# Patient Record
Sex: Female | Born: 1965 | Race: White | Hispanic: No | Smoking: Former smoker
Health system: Southern US, Community
[De-identification: ages and names within clinical notes are randomized; demographics above are authoritative.]

## PROBLEM LIST (undated history)

## (undated) DIAGNOSIS — F329 Major depressive disorder, single episode, unspecified: Secondary | ICD-10-CM

## (undated) DIAGNOSIS — F32A Depression, unspecified: Secondary | ICD-10-CM

## (undated) DIAGNOSIS — N200 Calculus of kidney: Secondary | ICD-10-CM

## (undated) DIAGNOSIS — J40 Bronchitis, not specified as acute or chronic: Secondary | ICD-10-CM

## (undated) DIAGNOSIS — K219 Gastro-esophageal reflux disease without esophagitis: Secondary | ICD-10-CM

## (undated) DIAGNOSIS — I10 Essential (primary) hypertension: Secondary | ICD-10-CM

## (undated) DIAGNOSIS — F419 Anxiety disorder, unspecified: Secondary | ICD-10-CM

## (undated) DIAGNOSIS — Z87442 Personal history of urinary calculi: Secondary | ICD-10-CM

## (undated) DIAGNOSIS — O24419 Gestational diabetes mellitus in pregnancy, unspecified control: Secondary | ICD-10-CM

## (undated) DIAGNOSIS — K8020 Calculus of gallbladder without cholecystitis without obstruction: Secondary | ICD-10-CM

## (undated) DIAGNOSIS — J45909 Unspecified asthma, uncomplicated: Secondary | ICD-10-CM

## (undated) HISTORY — PX: EXTRACORPOREAL SHOCK WAVE LITHOTRIPSY: SHX1557

## (undated) HISTORY — DX: Gestational diabetes mellitus in pregnancy, unspecified control: O24.419

## (undated) HISTORY — PX: HERNIA REPAIR: SHX51

---

## 2005-08-14 ENCOUNTER — Emergency Department (HOSPITAL_COMMUNITY): Admission: EM | Admit: 2005-08-14 | Discharge: 2005-08-14 | Payer: Self-pay | Admitting: Emergency Medicine

## 2005-08-23 ENCOUNTER — Emergency Department (HOSPITAL_COMMUNITY): Admission: EM | Admit: 2005-08-23 | Discharge: 2005-08-23 | Payer: Self-pay | Admitting: Emergency Medicine

## 2006-04-24 ENCOUNTER — Ambulatory Visit (HOSPITAL_COMMUNITY): Admission: RE | Admit: 2006-04-24 | Discharge: 2006-04-24 | Payer: Self-pay | Admitting: Urology

## 2006-05-09 ENCOUNTER — Emergency Department (HOSPITAL_COMMUNITY): Admission: EM | Admit: 2006-05-09 | Discharge: 2006-05-09 | Payer: Self-pay | Admitting: Emergency Medicine

## 2006-05-31 ENCOUNTER — Ambulatory Visit (HOSPITAL_COMMUNITY): Admission: RE | Admit: 2006-05-31 | Discharge: 2006-05-31 | Payer: Self-pay | Admitting: Urology

## 2006-07-16 ENCOUNTER — Emergency Department (HOSPITAL_COMMUNITY): Admission: EM | Admit: 2006-07-16 | Discharge: 2006-07-16 | Payer: Self-pay | Admitting: Emergency Medicine

## 2007-01-08 ENCOUNTER — Emergency Department (HOSPITAL_COMMUNITY): Admission: EM | Admit: 2007-01-08 | Discharge: 2007-01-09 | Payer: Self-pay | Admitting: Emergency Medicine

## 2008-08-27 ENCOUNTER — Emergency Department (HOSPITAL_COMMUNITY): Admission: EM | Admit: 2008-08-27 | Discharge: 2008-08-27 | Payer: Self-pay | Admitting: Emergency Medicine

## 2008-12-02 ENCOUNTER — Ambulatory Visit (HOSPITAL_COMMUNITY): Admission: RE | Admit: 2008-12-02 | Discharge: 2008-12-02 | Payer: Self-pay | Admitting: Family Medicine

## 2009-10-12 ENCOUNTER — Other Ambulatory Visit: Admission: RE | Admit: 2009-10-12 | Discharge: 2009-10-12 | Payer: Self-pay | Admitting: Obstetrics & Gynecology

## 2010-01-18 ENCOUNTER — Inpatient Hospital Stay (HOSPITAL_COMMUNITY): Admission: EM | Admit: 2010-01-18 | Discharge: 2010-01-20 | Payer: Self-pay | Admitting: Emergency Medicine

## 2010-01-28 ENCOUNTER — Emergency Department (HOSPITAL_COMMUNITY): Admission: EM | Admit: 2010-01-28 | Discharge: 2010-01-28 | Payer: Self-pay | Admitting: Emergency Medicine

## 2010-07-25 ENCOUNTER — Emergency Department (HOSPITAL_COMMUNITY): Admission: EM | Admit: 2010-07-25 | Discharge: 2010-07-25 | Payer: Self-pay | Admitting: Emergency Medicine

## 2010-10-14 ENCOUNTER — Ambulatory Visit (HOSPITAL_COMMUNITY): Admission: RE | Admit: 2010-10-14 | Payer: Self-pay | Admitting: Obstetrics & Gynecology

## 2010-12-04 ENCOUNTER — Encounter: Payer: Self-pay | Admitting: Obstetrics & Gynecology

## 2011-01-01 ENCOUNTER — Encounter (HOSPITAL_COMMUNITY): Payer: Self-pay | Admitting: Radiology

## 2011-01-01 ENCOUNTER — Emergency Department (HOSPITAL_COMMUNITY)
Admission: EM | Admit: 2011-01-01 | Discharge: 2011-01-01 | Disposition: A | Discharge status: Home / Self Care | Payer: Medicaid Other | Attending: Emergency Medicine | Admitting: Emergency Medicine

## 2011-01-01 ENCOUNTER — Emergency Department (HOSPITAL_COMMUNITY): Payer: Medicaid Other

## 2011-01-01 DIAGNOSIS — N23 Unspecified renal colic: Secondary | ICD-10-CM | POA: Insufficient documentation

## 2011-01-01 DIAGNOSIS — N200 Calculus of kidney: Secondary | ICD-10-CM | POA: Insufficient documentation

## 2011-01-01 DIAGNOSIS — R109 Unspecified abdominal pain: Secondary | ICD-10-CM | POA: Insufficient documentation

## 2011-01-01 HISTORY — DX: Calculus of kidney: N20.0

## 2011-01-01 LAB — URINALYSIS, ROUTINE W REFLEX MICROSCOPIC
Bilirubin Urine: NEGATIVE
Ketones, ur: NEGATIVE mg/dL
Leukocytes, UA: NEGATIVE
Nitrite: NEGATIVE
Protein, ur: NEGATIVE mg/dL
Specific Gravity, Urine: >1.03 — ABNORMAL HIGH (ref 1.005–1.030)
Urine Glucose, Fasting: NEGATIVE mg/dL
Urobilinogen, UA: 0.2 mg/dL (ref 0.0–1.0)
pH: 5.5 (ref 5.0–8.0)

## 2011-01-01 LAB — URINE MICROSCOPIC-ADD ON

## 2011-02-05 LAB — URINE MICROSCOPIC-ADD ON

## 2011-02-05 LAB — BASIC METABOLIC PANEL
BUN: 5 mg/dL — ABNORMAL LOW (ref 6–23)
BUN: 7 mg/dL (ref 6–23)
CO2: 28 mEq/L (ref 19–32)
CO2: 28 mEq/L (ref 19–32)
Calcium: 9.1 mg/dL (ref 8.4–10.5)
Calcium: 9.3 mg/dL (ref 8.4–10.5)
Calcium: 9.3 mg/dL (ref 8.4–10.5)
Chloride: 104 mEq/L (ref 96–112)
Chloride: 104 mEq/L (ref 96–112)
Creatinine, Ser: 0.63 mg/dL (ref 0.4–1.2)
Creatinine, Ser: 0.64 mg/dL (ref 0.4–1.2)
GFR calc Af Amer: >60 mL/min (ref >60)
GFR calc Af Amer: >60 mL/min (ref >60)
GFR calc Af Amer: >60 mL/min (ref >60)
GFR calc non Af Amer: >60 mL/min (ref >60)
GFR calc non Af Amer: >60 mL/min (ref >60)
GFR calc non Af Amer: >60 mL/min (ref >60)
Glucose, Bld: 143 mg/dL — ABNORMAL HIGH (ref 70–99)
Glucose, Bld: 91 mg/dL (ref 70–99)
Potassium: 3.8 mEq/L (ref 3.5–5.1)
Potassium: 4.2 mEq/L (ref 3.5–5.1)
Sodium: 135 mEq/L (ref 135–145)
Sodium: 135 mEq/L (ref 135–145)
Sodium: 136 mEq/L (ref 135–145)

## 2011-02-05 LAB — DIFFERENTIAL
Basophils Absolute: 0 10*3/uL (ref 0.0–0.1)
Basophils Absolute: 0 10*3/uL (ref 0.0–0.1)
Basophils Absolute: 0 10*3/uL (ref 0.0–0.1)
Basophils Relative: 0 % (ref 0–1)
Basophils Relative: 0 % (ref 0–1)
Eosinophils Absolute: 0.1 10*3/uL (ref 0.0–0.7)
Eosinophils Absolute: 0.1 10*3/uL (ref 0.0–0.7)
Eosinophils Relative: 1 % (ref 0–5)
Eosinophils Relative: 1 % (ref 0–5)
Lymphocytes Relative: 19 % (ref 12–46)
Lymphocytes Relative: 21 % (ref 12–46)
Lymphocytes Relative: 5 % — ABNORMAL LOW (ref 12–46)
Lymphs Abs: 1.8 10*3/uL (ref 0.7–4.0)
Lymphs Abs: 2.6 10*3/uL (ref 0.7–4.0)
Monocytes Absolute: 0.8 10*3/uL (ref 0.1–1.0)
Monocytes Absolute: 0.9 10*3/uL (ref 0.1–1.0)
Monocytes Absolute: 1.1 10*3/uL — ABNORMAL HIGH (ref 0.1–1.0)
Monocytes Relative: 5 % (ref 3–12)
Monocytes Relative: 7 % (ref 3–12)
Monocytes Relative: 9 % (ref 3–12)
Neutro Abs: 18.8 10*3/uL — ABNORMAL HIGH (ref 1.7–7.7)
Neutro Abs: 6.9 10*3/uL (ref 1.7–7.7)
Neutro Abs: 8.5 10*3/uL — ABNORMAL HIGH (ref 1.7–7.7)
Neutrophils Relative %: 71 % (ref 43–77)
Neutrophils Relative %: 71 % (ref 43–77)

## 2011-02-05 LAB — URINALYSIS, ROUTINE W REFLEX MICROSCOPIC
Bilirubin Urine: NEGATIVE
Glucose, UA: NEGATIVE mg/dL
Ketones, ur: NEGATIVE mg/dL
Leukocytes, UA: NEGATIVE
Nitrite: NEGATIVE
Protein, ur: NEGATIVE mg/dL
Specific Gravity, Urine: <1.005 — ABNORMAL LOW (ref 1.005–1.030)
Urobilinogen, UA: 0.2 mg/dL (ref 0.0–1.0)
pH: 5.5 (ref 5.0–8.0)

## 2011-02-05 LAB — CBC
HCT: 35.3 % — ABNORMAL LOW (ref 36.0–46.0)
HCT: 37.9 % (ref 36.0–46.0)
Hemoglobin: 12.6 g/dL (ref 12.0–15.0)
Hemoglobin: 13.5 g/dL (ref 12.0–15.0)
Hemoglobin: 13.7 g/dL (ref 12.0–15.0)
MCHC: 35.7 g/dL (ref 30.0–36.0)
MCHC: 35.7 g/dL (ref 30.0–36.0)
MCV: 93.3 fL (ref 78.0–100.0)
MCV: 93.8 fL (ref 78.0–100.0)
Platelets: 342 10*3/uL (ref 150–400)
Platelets: 385 10*3/uL (ref 150–400)
RBC: 3.76 MIL/uL — ABNORMAL LOW (ref 3.87–5.11)
RBC: 4.06 MIL/uL (ref 3.87–5.11)
RBC: 4.17 MIL/uL (ref 3.87–5.11)
RDW: 12.2 % (ref 11.5–15.5)
RDW: 12.5 % (ref 11.5–15.5)
WBC: 12.1 10*3/uL — ABNORMAL HIGH (ref 4.0–10.5)
WBC: 20.9 10*3/uL — ABNORMAL HIGH (ref 4.0–10.5)
WBC: 9.7 10*3/uL (ref 4.0–10.5)

## 2011-02-05 LAB — URINE CULTURE
Colony Count: NO GROWTH
Colony Count: NO GROWTH
Culture: NO GROWTH
Culture: NO GROWTH

## 2011-02-05 LAB — GLUCOSE, CAPILLARY: Glucose-Capillary: 129 mg/dL — ABNORMAL HIGH (ref 70–99)

## 2011-08-14 LAB — URINALYSIS, ROUTINE W REFLEX MICROSCOPIC
Glucose, UA: NEGATIVE
Leukocytes, UA: NEGATIVE
Specific Gravity, Urine: 1.025
Urobilinogen, UA: 0.2

## 2011-08-14 LAB — URINE MICROSCOPIC-ADD ON

## 2011-08-18 ENCOUNTER — Other Ambulatory Visit (HOSPITAL_COMMUNITY)
Admission: RE | Admit: 2011-08-18 | Discharge: 2011-08-18 | Disposition: A | Discharge status: Home / Self Care | Payer: Medicaid Other | Source: Ambulatory Visit | Attending: Obstetrics & Gynecology | Admitting: Obstetrics & Gynecology

## 2011-08-18 ENCOUNTER — Other Ambulatory Visit: Payer: Self-pay | Admitting: Obstetrics & Gynecology

## 2011-08-18 DIAGNOSIS — Z01419 Encounter for gynecological examination (general) (routine) without abnormal findings: Secondary | ICD-10-CM | POA: Insufficient documentation

## 2011-08-28 ENCOUNTER — Other Ambulatory Visit: Payer: Self-pay | Admitting: Obstetrics & Gynecology

## 2011-08-28 DIAGNOSIS — Z139 Encounter for screening, unspecified: Secondary | ICD-10-CM

## 2011-09-01 ENCOUNTER — Ambulatory Visit (HOSPITAL_COMMUNITY)
Admission: RE | Admit: 2011-09-01 | Discharge: 2011-09-01 | Disposition: A | Discharge status: Home / Self Care | Payer: Medicaid Other | Source: Ambulatory Visit | Attending: Obstetrics & Gynecology | Admitting: Obstetrics & Gynecology

## 2011-09-01 ENCOUNTER — Other Ambulatory Visit: Payer: Self-pay | Admitting: Obstetrics & Gynecology

## 2011-09-01 DIAGNOSIS — Z139 Encounter for screening, unspecified: Secondary | ICD-10-CM

## 2011-09-01 DIAGNOSIS — Z1231 Encounter for screening mammogram for malignant neoplasm of breast: Secondary | ICD-10-CM | POA: Insufficient documentation

## 2011-12-13 ENCOUNTER — Emergency Department (HOSPITAL_COMMUNITY): Payer: Self-pay

## 2011-12-13 ENCOUNTER — Encounter (HOSPITAL_COMMUNITY): Payer: Self-pay | Admitting: *Deleted

## 2011-12-13 ENCOUNTER — Emergency Department (HOSPITAL_COMMUNITY)
Admission: EM | Admit: 2011-12-13 | Discharge: 2011-12-13 | Disposition: A | Discharge status: Home / Self Care | Payer: Self-pay | Attending: Emergency Medicine | Admitting: Emergency Medicine

## 2011-12-13 DIAGNOSIS — J209 Acute bronchitis, unspecified: Secondary | ICD-10-CM | POA: Insufficient documentation

## 2011-12-13 DIAGNOSIS — F17200 Nicotine dependence, unspecified, uncomplicated: Secondary | ICD-10-CM | POA: Insufficient documentation

## 2011-12-13 HISTORY — DX: Bronchitis, not specified as acute or chronic: J40

## 2011-12-13 MED ORDER — PREDNISONE 20 MG PO TABS: ORAL_TABLET | ORAL | Status: DC

## 2011-12-13 MED ORDER — ALBUTEROL (5 MG/ML) CONTINUOUS INHALATION SOLN
10.0000 mg/h | INHALATION_SOLUTION | RESPIRATORY_TRACT | Status: AC
  Administered 2011-12-13: 10 mg/h via RESPIRATORY_TRACT
  Filled 2011-12-13: qty 20

## 2011-12-13 MED ORDER — DOXYCYCLINE HYCLATE 100 MG PO CAPS: 100.0000 mg | ORAL_CAPSULE | Freq: Two times a day (BID) | ORAL | Status: AC

## 2011-12-13 MED ORDER — AEROCHAMBER Z-STAT PLUS/MEDIUM MISC
1.0000 | Freq: Once | Status: DC
Start: 2011-12-13 — End: 2011-12-13

## 2011-12-13 MED ORDER — IPRATROPIUM BROMIDE 0.02 % IN SOLN
0.5000 mg | Freq: Once | RESPIRATORY_TRACT | Status: AC
  Administered 2011-12-13: 0.5 mg via RESPIRATORY_TRACT
  Filled 2011-12-13: qty 2.5

## 2011-12-13 MED ORDER — ALBUTEROL SULFATE HFA 108 (90 BASE) MCG/ACT IN AERS
2.0000 | INHALATION_SPRAY | RESPIRATORY_TRACT | Status: DC | PRN
  Administered 2011-12-13: 2 via RESPIRATORY_TRACT
  Filled 2011-12-13: qty 6.7

## 2011-12-13 MED ORDER — DM-GUAIFENESIN ER 30-600 MG PO TB12
1.0000 | ORAL_TABLET | Freq: Once | ORAL | Status: AC
  Administered 2011-12-13: 1 via ORAL
  Filled 2011-12-13: qty 1

## 2011-12-13 MED ORDER — PREDNISONE 20 MG PO TABS
60.0000 mg | ORAL_TABLET | ORAL | Status: AC
  Administered 2011-12-13: 60 mg via ORAL
  Filled 2011-12-13: qty 3

## 2011-12-13 NOTE — ED Notes (Signed)
Pt stated she has been sick for 1 week w. Cough.  Has been using otc meds w. No relief.  resp even and unlabored.  Exp wheezes noted. Awaiting cxray

## 2011-12-13 NOTE — ED Notes (Signed)
Cough, sob for 1 week, sore in throat and chest, wheeze

## 2011-12-13 NOTE — ED Notes (Signed)
Warm blanket given, cont. W. Breathing treatment.  Watching tv.

## 2011-12-13 NOTE — ED Provider Notes (Signed)
History   This chart was scribed for Ward Givens, MD by Clarita Crane. The patient was seen in room APA12/APA12 and the patient's care was started at 12:18PM.   CSN: [id number]  Arrival date & time 12/13/11  1119   First MD Initiated Contact with Patient 12/13/11 1151      Chief Complaint  Patient presents with  . Shortness of Breath    (Consider location/radiation/quality/duration/timing/severity/associated sxs/prior treatment) HPI Michaela Aguilar is a 46 y.o. female who presents to the Emergency Department complaining of constant moderate SOB with associated productive cough with yellow sputum, wheezing, sore throat, abdominal pain and indicates the lateral aspect of her abdomen with coughing described as soreness and chest soreness with coughing onset 1 week ago and persistent since. Denies fever, nausea, vomiting, diarrhea. She relates the last time she had used an inhaler was one year ago   PCP- Dondiego    Past Medical History  Diagnosis Date  . Kidney stones per pt.  . Bronchitis     Past Surgical History  Procedure Date  . Cesarean section     History reviewed. No pertinent family history.  History  Substance Use Topics  . Smoking status: Current Everyday Smoker  . Smokeless tobacco: Not on file  . Alcohol Use: Yes  student in nursing school  OB History    Grav Para Term Preterm Abortions TAB SAB Ect Mult Living                  Review of Systems 10 Systems reviewed and are negative for acute change except as noted in the HPI.  Allergies  Review of patient's allergies indicates no known allergies.  Home Medications  No current outpatient prescriptions on file.  BP 115/79  Pulse 72  Temp 98.7 F (37.1 C)  Resp 20  Ht 5\' 5"  (1.651 m)  Wt 224 lb (101.606 kg)  BMI 37.28 kg/m2  SpO2 97%  LMP 12/07/2011   Vital signs normal    Physical Exam  Nursing note and vitals reviewed. Constitutional: She is oriented to person, place, and time. She  appears well-developed and well-nourished. No distress.  HENT:  Head: Normocephalic and atraumatic.  Mouth/Throat: Oropharynx is clear and moist. No oropharyngeal exudate.  Eyes: EOM are normal. Pupils are equal, round, and reactive to light.  Neck: Neck supple. No tracheal deviation present.  Cardiovascular: Normal rate and regular rhythm.   No murmur heard. Pulmonary/Chest: She is in respiratory distress. She has wheezes. She exhibits tenderness.       Actively coughing during exam. Diminished breath sounds diffusely with end expiratory wheezing diffusely  Abdominal: Soft. She exhibits no distension.  Musculoskeletal: Normal range of motion. She exhibits no edema.  Neurological: She is alert and oriented to person, place, and time. No sensory deficit.  Skin: Skin is warm and dry.  Psychiatric: She has a normal mood and affect. Her behavior is normal.    ED Course  Procedures (including critical care time)  DIAGNOSTIC STUDIES: Oxygen Saturation is 97% on room air, normal by my interpretation.    COORDINATION OF CARE: 12.20PM- Patient informed of current plan for treatment and evaluation and agrees with plan at this time.  2:18PM- Patient current receiving breathing treatment. Upon re-evaluation, patient with improved air movement and diffuse lower pitched wheezing.   16:15 recheck, lungs are much improved, much better air flow, rare wheeze. States her cough is better and she feels ready to go home.    Medications  albuterol (PROVENTIL,VENTOLIN) solution continuous neb (10 mg/hr Nebulization New Bag/Given 12/13/11 1240)  albuterol (PROVENTIL HFA;VENTOLIN HFA) 108 (90 BASE) MCG/ACT inhaler 2 puff (not administered)  aerochamber Z-Stat Plus/medium 1 each (not administered)  ipratropium (ATROVENT) nebulizer solution 0.5 mg (0.5 mg Nebulization Given 12/13/11 1240)  predniSONE (DELTASONE) tablet 60 mg (60 mg Oral Given 12/13/11 1225)  dextromethorphan-guaiFENesin (MUCINEX DM) 30-600 MG  per 12 hr tablet 1 tablet (1 tablet Oral Given 12/13/11 1317)    Dg Chest 2 View  12/13/2011  *RADIOLOGY REPORT*  Clinical Data: Cough.  Short of breath.  CHEST - 2 VIEW  Comparison: None.  Findings:  Cardiopericardial silhouette within normal limits. Mediastinal contours normal. Trachea midline.  No airspace disease or effusion.  Bosselation of the left hemidiaphragm.  IMPRESSION: No acute cardiopulmonary disease.  No interval change.  Original Report Authenticated By: Andreas Newport, M.D.    Diagnoses that have been ruled out:  None  Diagnoses that are still under consideration:  None  Final diagnoses:  Bronchitis with bronchospasm   New Prescriptions   DOXYCYCLINE (VIBRAMYCIN) 100 MG CAPSULE    Take 1 capsule (100 mg total) by mouth 2 (two) times daily.   PREDNISONE (DELTASONE) 20 MG TABLET    Take 3 po QD x 2d starting tomorrow, then 2 po QD x 3d then 1 po QD x 3d   Plan discharge  Devoria Albe, MD, FACEP     MDM    I personally performed the services described in this documentation, which was scribed in my presence. The recorded information has been reviewed and considered. Devoria Albe, MD, Armando Gang        Ward Givens, MD 12/13/11 [phone]

## 2011-12-13 NOTE — ED Notes (Signed)
Pt w/ cont. Neb.  Stated she is feeling much better.

## 2011-12-13 NOTE — ED Notes (Signed)
Cont, to await meds

## 2011-12-13 NOTE — ED Notes (Signed)
Awaiting meds from rx.  Pt breathing better after treatment

## 2012-10-30 ENCOUNTER — Emergency Department (HOSPITAL_COMMUNITY)
Admission: EM | Admit: 2012-10-30 | Discharge: 2012-10-30 | Disposition: A | Discharge status: Home / Self Care | Payer: Self-pay | Attending: Emergency Medicine | Admitting: Emergency Medicine

## 2012-10-30 ENCOUNTER — Emergency Department (HOSPITAL_COMMUNITY): Payer: Self-pay

## 2012-10-30 ENCOUNTER — Encounter (HOSPITAL_COMMUNITY): Payer: Self-pay | Admitting: Emergency Medicine

## 2012-10-30 DIAGNOSIS — M436 Torticollis: Secondary | ICD-10-CM | POA: Insufficient documentation

## 2012-10-30 DIAGNOSIS — IMO0001 Reserved for inherently not codable concepts without codable children: Secondary | ICD-10-CM | POA: Insufficient documentation

## 2012-10-30 DIAGNOSIS — Z87442 Personal history of urinary calculi: Secondary | ICD-10-CM | POA: Insufficient documentation

## 2012-10-30 DIAGNOSIS — F17200 Nicotine dependence, unspecified, uncomplicated: Secondary | ICD-10-CM | POA: Insufficient documentation

## 2012-10-30 DIAGNOSIS — Z8709 Personal history of other diseases of the respiratory system: Secondary | ICD-10-CM | POA: Insufficient documentation

## 2012-10-30 MED ORDER — KETOROLAC TROMETHAMINE 60 MG/2ML IM SOLN
60.0000 mg | Freq: Once | INTRAMUSCULAR | Status: AC
  Administered 2012-10-30: 60 mg via INTRAMUSCULAR

## 2012-10-30 MED ORDER — IBUPROFEN 600 MG PO TABS: 600.0000 mg | ORAL_TABLET | Freq: Four times a day (QID) | ORAL | Status: DC | PRN

## 2012-10-30 MED ORDER — METHOCARBAMOL 500 MG PO TABS: 1000.0000 mg | ORAL_TABLET | Freq: Four times a day (QID) | ORAL | Status: AC

## 2012-10-30 MED ORDER — KETOROLAC TROMETHAMINE 60 MG/2ML IM SOLN
INTRAMUSCULAR | Status: AC
  Administered 2012-10-30: 60 mg via INTRAMUSCULAR
  Filled 2012-10-30: qty 2

## 2012-10-30 MED ORDER — HYDROMORPHONE HCL PF 1 MG/ML IJ SOLN
1.0000 mg | Freq: Once | INTRAMUSCULAR | Status: AC
  Administered 2012-10-30: 1 mg via INTRAMUSCULAR

## 2012-10-30 MED ORDER — HYDROMORPHONE HCL PF 1 MG/ML IJ SOLN
INTRAMUSCULAR | Status: AC
  Filled 2012-10-30: qty 1

## 2012-10-30 MED ORDER — HYDROCODONE-ACETAMINOPHEN 5-325 MG PO TABS
1.0000 | ORAL_TABLET | Freq: Once | ORAL | Status: AC
  Administered 2012-10-30: 1 via ORAL
  Filled 2012-10-30: qty 1

## 2012-10-30 MED ORDER — METHOCARBAMOL 500 MG PO TABS
1000.0000 mg | ORAL_TABLET | Freq: Once | ORAL | Status: AC
  Administered 2012-10-30: 1000 mg via ORAL
  Filled 2012-10-30 (×2): qty 1

## 2012-10-30 MED ORDER — OXYCODONE-ACETAMINOPHEN 5-325 MG PO TABS: 1.0000 | ORAL_TABLET | ORAL | Status: AC | PRN

## 2012-10-30 NOTE — ED Notes (Signed)
Patient with no complaints at this time. Respirations even and unlabored. Skin warm/dry. Discharge instructions reviewed with patient at this time. Patient given opportunity to voice concerns/ask questions. Patient discharged at this time and left Emergency Department with steady gait.   

## 2012-10-30 NOTE — ED Notes (Signed)
Pt c/o neck pain x 2 days. Pt denies any injury.

## 2012-10-31 NOTE — ED Provider Notes (Signed)
Medical screening examination/treatment/procedure(s) were performed by non-physician practitioner and as supervising physician I was immediately available for consultation/collaboration.   Domitila Stetler III, MD 10/31/12 1330 

## 2012-10-31 NOTE — ED Provider Notes (Signed)
History     CSN: [id number]  Arrival date & time 10/30/12  1815   First MD Initiated Contact with Patient 10/30/12 1908      Chief Complaint  Patient presents with  . Neck Pain    (Consider location/radiation/quality/duration/timing/severity/associated sxs/prior treatment) HPI Comments: Michaela Aguilar presents with a 2 day history of left sided neck and shoulder pain which is worse with movement.  She denies injury, stating she woke with pain and spasm which has become worse.  She denies weakness or numbness in her arms or hands. The pain is constant,  Worse with movement and palpation.  The pain does not radiate.  She has taken tylenol without relief.   The history is provided by the patient.    Past Medical History  Diagnosis Date  . Kidney stones per pt.  . Bronchitis     Past Surgical History  Procedure Date  . Cesarean section     History reviewed. No pertinent family history.  History  Substance Use Topics  . Smoking status: Current Every Day Smoker  . Smokeless tobacco: Not on file  . Alcohol Use: Yes    OB History    Grav Para Term Preterm Abortions TAB SAB Ect Mult Living                  Review of Systems  Constitutional: Negative for fever and chills.  HENT: Positive for neck pain and neck stiffness.   Eyes: Negative for photophobia and visual disturbance.  Respiratory: Negative for shortness of breath.   Cardiovascular: Negative for chest pain and leg swelling.  Gastrointestinal: Negative for abdominal pain, constipation and abdominal distention.  Genitourinary: Negative for dysuria, urgency, frequency, flank pain and difficulty urinating.  Musculoskeletal: Positive for myalgias. Negative for back pain, joint swelling and gait problem.  Skin: Negative for rash.  Neurological: Negative for weakness, numbness and headaches.    Allergies  Review of patient's allergies indicates no known allergies.  Home Medications   Current Outpatient Rx  Name   Route  Sig  Dispense  Refill  . ACETAMINOPHEN 500 MG PO TABS   Oral   Take 500-1,000 mg by mouth daily as needed. For pain         . ALPRAZOLAM 1 MG PO TABS   Oral   Take 1 mg by mouth daily as needed. For pain         . IBUPROFEN 600 MG PO TABS   Oral   Take 1 tablet (600 mg total) by mouth every 6 (six) hours as needed for pain.   30 tablet   0   . METHOCARBAMOL 500 MG PO TABS   Oral   Take 2 tablets (1,000 mg total) by mouth 4 (four) times daily.   40 tablet   0   . OXYCODONE-ACETAMINOPHEN 5-325 MG PO TABS   Oral   Take 1 tablet by mouth every 4 (four) hours as needed for pain.   15 tablet   0     BP 128/84  Pulse 85  Temp 98.8 F (37.1 C) (Oral)  Resp 20  Ht 5\' 5"  (1.651 m)  Wt 225 lb (102.059 kg)  BMI 37.44 kg/m2  SpO2 97%  LMP 10/23/2012  Physical Exam  Nursing note and vitals reviewed. Constitutional: She appears well-developed and well-nourished.  HENT:  Head: Normocephalic.  Eyes: Conjunctivae normal are normal.  Neck: Muscular tenderness present. No spinous process tenderness present. Decreased range of motion present.  TTP along left paracervical, left upper shoulder and left upper scapula in distribution of trapezius muscle,  Spasm noted along paracervical line. Decreased ROM worse with left rotation.    Cardiovascular: Normal rate and intact distal pulses.        Pedal pulses normal.  Pulmonary/Chest: Effort normal.  Abdominal: Soft. Bowel sounds are normal. She exhibits no distension and no mass.  Musculoskeletal: She exhibits no edema.       Lumbar back: She exhibits tenderness. She exhibits no swelling, no edema and no spasm.  Neurological: She is alert. She has normal strength. She displays no atrophy and no tremor. No sensory deficit. Gait normal.  Reflex Scores:      Bicep reflexes are 2+ on the right side and 2+ on the left side.      Equal grip strength.  Skin: Skin is warm and dry.  Psychiatric: She has a normal mood and  affect.    ED Course  Procedures (including critical care time)  Labs Reviewed - No data to display Dg Cervical Spine Complete  10/30/2012  *RADIOLOGY REPORT*  Clinical Data: 2-day history of neck pain.  No known injuries.  CERVICAL SPINE - COMPLETE 4+ VIEW  Comparison: None.  Findings: Straightening of the usual cervical lordosis.  Anatomic alignment.  No visible fractures.  Well-preserved disc spaces. Normal prevertebral soft tissues.  Facet joints intact.  No significant bony foraminal stenoses.  No static evidence of instability.  IMPRESSION: Straightening of the usual lordosis which may reflect positioning and/or spasm.  Otherwise normal examination.   Original Report Authenticated By: Hulan Saas, M.D.      1. Torticollis, acute       MDM  xrays reviewed.  Pt with musculoskeletal source of neck and shoulder pain, muscle spasm on exam, reproducible with rom.  Prescribed oxycodone, robaxin, ibuprofen.  Heat,  Stretching rom exercises discussed.  Prn f/u with pcp if not improved over the next week.        Burgess Amor, [place] 10/31/12 [phone]

## 2014-02-26 ENCOUNTER — Emergency Department (HOSPITAL_COMMUNITY)
Admission: EM | Admit: 2014-02-26 | Discharge: 2014-02-26 | Disposition: A | Discharge status: Home / Self Care | Payer: Self-pay | Attending: Emergency Medicine | Admitting: Emergency Medicine

## 2014-02-26 ENCOUNTER — Emergency Department (HOSPITAL_COMMUNITY): Payer: Self-pay

## 2014-02-26 ENCOUNTER — Encounter (HOSPITAL_COMMUNITY): Payer: Self-pay | Admitting: Emergency Medicine

## 2014-02-26 DIAGNOSIS — M159 Polyosteoarthritis, unspecified: Secondary | ICD-10-CM

## 2014-02-26 DIAGNOSIS — Z79899 Other long term (current) drug therapy: Secondary | ICD-10-CM | POA: Insufficient documentation

## 2014-02-26 DIAGNOSIS — F17200 Nicotine dependence, unspecified, uncomplicated: Secondary | ICD-10-CM | POA: Insufficient documentation

## 2014-02-26 DIAGNOSIS — R209 Unspecified disturbances of skin sensation: Secondary | ICD-10-CM | POA: Insufficient documentation

## 2014-02-26 DIAGNOSIS — M1990 Unspecified osteoarthritis, unspecified site: Secondary | ICD-10-CM | POA: Insufficient documentation

## 2014-02-26 DIAGNOSIS — N200 Calculus of kidney: Secondary | ICD-10-CM | POA: Insufficient documentation

## 2014-02-26 DIAGNOSIS — Z8709 Personal history of other diseases of the respiratory system: Secondary | ICD-10-CM | POA: Insufficient documentation

## 2014-02-26 MED ORDER — METHOCARBAMOL 500 MG PO TABS
1000.0000 mg | ORAL_TABLET | Freq: Once | ORAL | Status: AC
  Administered 2014-02-26: 1000 mg via ORAL
  Filled 2014-02-26: qty 2

## 2014-02-26 MED ORDER — MELOXICAM 7.5 MG PO TABS: ORAL_TABLET | ORAL | Status: DC

## 2014-02-26 MED ORDER — BACLOFEN 10 MG PO TABS: 10.0000 mg | ORAL_TABLET | Freq: Three times a day (TID) | ORAL | Status: AC

## 2014-02-26 MED ORDER — KETOROLAC TROMETHAMINE 10 MG PO TABS
10.0000 mg | ORAL_TABLET | Freq: Once | ORAL | Status: AC
  Administered 2014-02-26: 10 mg via ORAL
  Filled 2014-02-26: qty 1

## 2014-02-26 MED ORDER — PREDNISONE 50 MG PO TABS
60.0000 mg | ORAL_TABLET | Freq: Once | ORAL | Status: AC
  Administered 2014-02-26: 60 mg via ORAL
  Filled 2014-02-26 (×2): qty 1

## 2014-02-26 MED ORDER — DEXAMETHASONE 4 MG PO TABS: ORAL_TABLET | ORAL | Status: DC

## 2014-02-26 NOTE — ED Notes (Signed)
Pt with left hip pain that radiates down, also with left sided neck pain that radiates into her left shoulder and c/o numbness/tingling to left hand, pt denies any injury

## 2014-02-26 NOTE — ED Notes (Signed)
Pt c/o left hip pain that radiates into leg/foot with tingling in left foot x 2 days. Pt also c/o chronic neck pain x 2 years and radiating into left arm with tingling in left hand.

## 2014-02-26 NOTE — ED Provider Notes (Signed)
CSN: [id number]     Arrival date & time 02/26/14  1234 History   First MD Initiated Contact with Patient 02/26/14 1300     Chief Complaint  Patient presents with  . Hip Pain     (Consider location/radiation/quality/duration/timing/severity/associated sxs/prior Treatment) HPI Comments: Patient is a 48 year old female who presents to the emergency department with a complaint of pain from the neck to the left hip. Pain from the neck to the left arm, with tingling of the hand. Is also complained of pain from the neck down to the left foot with numbness and tingling of the left foot. The patient states she's been having problems with her neck for a little over a year. She was seen in the emergency department in 2013 which time she had an x-ray of the neck done and there were no acute changes noted. The patient was referred to her primary physician, Dr. Lorriane Shire. No acute problems were identified at that time. The patient states however she has been having some pain with her neck since that time. She states that in the last 2 or 3 days she has noticed that she has pain and numbness that goes into her left arm with tingling of her left hand, she also has pain from her neck down to her foot, with numbness of the left lower extremity. She is not dropping objects. She's not had falls. She's not had loss of bowel or bladder function. She's not had any problem with her speech, or eating. She presents now for assistance with her pain, and to attempt to find out why she is having such an increase in her discomfort.  The history is provided by the patient.    Past Medical History  Diagnosis Date  . Kidney stones per pt.  . Bronchitis    Past Surgical History  Procedure Laterality Date  . Cesarean section    . Hernia repair     No family history on file. History  Substance Use Topics  . Smoking status: Current Every Day Smoker  . Smokeless tobacco: Not on file  . Alcohol Use: No   OB History   Grav  Para Term Preterm Abortions TAB SAB Ect Mult Living                 Review of Systems  Constitutional: Negative for activity change.       All ROS Neg except as noted in HPI  HENT: Negative for nosebleeds.   Eyes: Negative for photophobia and discharge.  Respiratory: Negative for cough, shortness of breath and wheezing.   Cardiovascular: Negative for chest pain and palpitations.  Gastrointestinal: Negative for abdominal pain and blood in stool.  Genitourinary: Negative for dysuria, frequency and hematuria.  Musculoskeletal: Positive for back pain and neck pain. Negative for arthralgias.  Skin: Negative.   Neurological: Positive for numbness. Negative for dizziness, seizures and speech difficulty.  Psychiatric/Behavioral: Negative for hallucinations and confusion.      Allergies  Review of patient's allergies indicates no known allergies.  Home Medications   Prior to Admission medications   Medication Sig Start Date End Date Taking? Authorizing Provider  ALPRAZolam Duanne Moron) 1 MG tablet Take 1 mg by mouth 2 (two) times daily. For pain    Historical Provider, MD   BP 97/60  Pulse 77  Temp(Src) 98.2 F (36.8 C) (Oral)  Resp 18  Ht 5\' 5"  (1.651 m)  Wt 226 lb (102.513 kg)  BMI 37.61 kg/m2  SpO2 95%  LMP  02/16/2014 Physical Exam  Nursing note and vitals reviewed. Constitutional: She is oriented to person, place, and time. She appears well-developed and well-nourished.  Non-toxic appearance.  HENT:  Head: Normocephalic.  Right Ear: Tympanic membrane and external ear normal.  Left Ear: Tympanic membrane and external ear normal.  Eyes: EOM and lids are normal. Pupils are equal, round, and reactive to light.  Neck: Normal range of motion. Neck supple. Carotid bruit is not present.  Cardiovascular: Normal rate, regular rhythm, normal heart sounds, intact distal pulses and normal pulses.   Pulmonary/Chest: Breath sounds normal. No respiratory distress.  Abdominal: Soft. Bowel  sounds are normal. There is no tenderness. There is no guarding.  Musculoskeletal: Normal range of motion.  There is stiffness and pain with attempted range of motion of the cervical spine. There is paraspinal area tenderness, left greater than right. There is no palpable step off of the cervical spine area. There is pain with attempted range of motion of the left shoulder. There is pain to palpation just under the left scapula. The patient has pain with change of position in the lumbar area. There is right and left paraspinal area tenderness of the lumbar region. There is decreased range of motion of the left lower extremities do to pain. There is no hot joints appreciated. The radial pulses are 2+ bilaterally in the dorsalis pedis pulse 2+ bilaterally.  Lymphadenopathy:       Head (right side): No submandibular adenopathy present.       Head (left side): No submandibular adenopathy present.    She has no cervical adenopathy.  Neurological: She is alert and oriented to person, place, and time. She has normal strength. No cranial nerve deficit or sensory deficit.  The grip is symmetrical. The bony strength of the lower extremities is symmetrical. The patient states she has a numb tingling sensation to palpation of the left lower extremity. She states that her left hand feels numb during the course of the examination. There is no muscle atrophy of the upper or lower extremities.  Skin: Skin is warm and dry.  Psychiatric: She has a normal mood and affect. Her speech is normal.    ED Course  Procedures (including critical care time) Labs Review Labs Reviewed - No data to display  Imaging Review No results found.   EKG Interpretation None      MDM CT scan of the cervical spine is negative for any acute problem. CT scan of the abdomen and pelvis reveals a tiny calculi in the upper and lower pole of the left kidney, multiple calculi in the lower pole of the right kidney, with the largest being  7.0 mm in diameter. there no known ureteral calculi present. There is little if any degenerative changes in the lumbar spine. There is mild L4-L5 and L5-S1 facets arthropathy. There is no spinal stenosis or disc herniation appreciated.  The findings were discussed with the patient in terms which he understands. I suggested to the patient that she speak with her primary physician or orthopedic specialist concerning the hip pain and tingling in the foot. Prescription for Mobic, Decadron, and baclofen were given to the patient.    Final diagnoses:  None    *I have reviewed nursing notes, vital signs, and all appropriate lab and imaging results for this patient.Lenox Ahr, PA-C 02/28/14 [phone]

## 2014-02-26 NOTE — Discharge Instructions (Signed)
The CT scan of your cervical spine reveals some mild arthritis changes. The CT scan of your pelvis reveals some mild degenerative changes of your lower back and pelvis area. The CT scan of your abdomen revealed some stones in the kidney, but no stones in the ureters. Please use Mobic, baclofen, and Decadron to assist with your discomfort. You may benefit from assistance with the adult medicine clinic for help with your discomfort and for additional medical followup. Please call them for an appointment. Arthritis, Nonspecific Arthritis is pain, redness, warmth, or puffiness (inflammation) of a joint. The joint may be stiff or hurt when you move it. One or more joints may be affected. There are many types of arthritis. Your doctor may not know what type you have right away. The most common cause of arthritis is wear and tear on the joint (osteoarthritis). HOME CARE   Only take medicine as told by your doctor.  Rest the joint as much as possible.  Raise (elevate) your joint if it is puffy.  Use crutches if the painful joint is in your leg.  Drink enough fluids to keep your pee (urine) clear or pale yellow.  Follow your doctor's diet instructions.  Use cold packs for very bad joint pain for 10 to 15 minutes every hour. Ask your doctor if it is okay for you to use hot packs.  Exercise as told by your doctor.  Take a warm shower if you have stiffness in the morning.  Move your sore joints throughout the day. GET HELP RIGHT AWAY IF:   You have a fever.  You have very bad joint pain, puffiness, or redness.  You have many joints that are painful and puffy.  You are not getting better with treatment.  You have very bad back pain or leg weakness.  You cannot control when you poop (bowel movement) or pee (urinate).  You do not feel better in 24 hours or are getting worse.  You are having side effects from your medicine. MAKE SURE YOU:   Understand these instructions.  Will watch your  condition.  Will get help right away if you are not doing well or get worse. Document Released: 01/24/2010 Document Revised: 04/30/2012 Document Reviewed: 01/24/2010 [facility] Patient Information 2014 [place], [place].

## 2014-02-28 NOTE — ED Provider Notes (Signed)
  Medical screening examination/treatment/procedure(s) were performed by non-physician practitioner and as supervising physician I was immediately available for consultation/collaboration.   EKG Interpretation None         Carmin Muskrat, MD 02/28/14 2046

## 2014-08-30 ENCOUNTER — Emergency Department (HOSPITAL_COMMUNITY)
Admission: EM | Admit: 2014-08-30 | Discharge: 2014-08-30 | Disposition: A | Discharge status: Home / Self Care | Payer: Self-pay | Attending: Emergency Medicine | Admitting: Emergency Medicine

## 2014-08-30 ENCOUNTER — Emergency Department (HOSPITAL_COMMUNITY): Payer: Self-pay

## 2014-08-30 ENCOUNTER — Encounter (HOSPITAL_COMMUNITY): Payer: Self-pay | Admitting: Emergency Medicine

## 2014-08-30 DIAGNOSIS — Z79899 Other long term (current) drug therapy: Secondary | ICD-10-CM | POA: Insufficient documentation

## 2014-08-30 DIAGNOSIS — Z720 Tobacco use: Secondary | ICD-10-CM | POA: Insufficient documentation

## 2014-08-30 DIAGNOSIS — N200 Calculus of kidney: Secondary | ICD-10-CM | POA: Insufficient documentation

## 2014-08-30 LAB — COMPREHENSIVE METABOLIC PANEL
ALT: 24 U/L (ref 0–35)
AST: 19 U/L (ref 0–37)
Albumin: 3.8 g/dL (ref 3.5–5.2)
Alkaline Phosphatase: 86 U/L (ref 39–117)
Anion gap: 11 (ref 5–15)
BUN: 12 mg/dL (ref 6–23)
CALCIUM: 10 mg/dL (ref 8.4–10.5)
CO2: 25 meq/L (ref 19–32)
Chloride: 102 mEq/L (ref 96–112)
Creatinine, Ser: 0.8 mg/dL (ref 0.50–1.10)
GFR calc Af Amer: >90 mL/min (ref >90)
GFR, EST NON AFRICAN AMERICAN: 85 mL/min — AB (ref >90)
Glucose, Bld: 141 mg/dL — ABNORMAL HIGH (ref 70–99)
Potassium: 4 mEq/L (ref 3.7–5.3)
Sodium: 138 mEq/L (ref 137–147)
TOTAL PROTEIN: 7.3 g/dL (ref 6.0–8.3)
Total Bilirubin: 0.7 mg/dL (ref 0.3–1.2)

## 2014-08-30 LAB — CBC WITH DIFFERENTIAL/PLATELET
BASOS ABS: 0 10*3/uL (ref 0.0–0.1)
Basophils Relative: 0 % (ref 0–1)
EOS ABS: 0.2 10*3/uL (ref 0.0–0.7)
EOS PCT: 2 % (ref 0–5)
HEMATOCRIT: 41.6 % (ref 36.0–46.0)
Hemoglobin: 14.4 g/dL (ref 12.0–15.0)
Lymphocytes Relative: 25 % (ref 12–46)
Lymphs Abs: 2.5 10*3/uL (ref 0.7–4.0)
MCH: 32.1 pg (ref 26.0–34.0)
MCHC: 34.6 g/dL (ref 30.0–36.0)
MCV: 92.7 fL (ref 78.0–100.0)
MONO ABS: 0.9 10*3/uL (ref 0.1–1.0)
Monocytes Relative: 9 % (ref 3–12)
Neutro Abs: 6.3 10*3/uL (ref 1.7–7.7)
Neutrophils Relative %: 64 % (ref 43–77)
Platelets: 338 10*3/uL (ref 150–400)
RBC: 4.49 MIL/uL (ref 3.87–5.11)
RDW: 12.1 % (ref 11.5–15.5)
WBC: 10 10*3/uL (ref 4.0–10.5)

## 2014-08-30 LAB — URINE MICROSCOPIC-ADD ON

## 2014-08-30 LAB — URINALYSIS, ROUTINE W REFLEX MICROSCOPIC
Bilirubin Urine: NEGATIVE
GLUCOSE, UA: NEGATIVE mg/dL
KETONES UR: NEGATIVE mg/dL
Leukocytes, UA: NEGATIVE
Nitrite: NEGATIVE
Protein, ur: NEGATIVE mg/dL
Specific Gravity, Urine: 1.025 (ref 1.005–1.030)
UROBILINOGEN UA: 0.2 mg/dL (ref 0.0–1.0)
pH: 6 (ref 5.0–8.0)

## 2014-08-30 LAB — LIPASE, BLOOD: LIPASE: 23 U/L (ref 11–59)

## 2014-08-30 MED ORDER — ONDANSETRON HCL 4 MG/2ML IJ SOLN
4.0000 mg | Freq: Once | INTRAMUSCULAR | Status: AC
  Administered 2014-08-30: 4 mg via INTRAVENOUS
  Filled 2014-08-30: qty 2

## 2014-08-30 MED ORDER — OXYCODONE-ACETAMINOPHEN 5-325 MG PO TABS: 1.0000 | ORAL_TABLET | Freq: Four times a day (QID) | ORAL | Status: DC | PRN

## 2014-08-30 MED ORDER — HYDROMORPHONE HCL 1 MG/ML IJ SOLN
1.0000 mg | Freq: Once | INTRAMUSCULAR | Status: AC
  Administered 2014-08-30: 1 mg via INTRAVENOUS
  Filled 2014-08-30: qty 1

## 2014-08-30 MED ORDER — KETOROLAC TROMETHAMINE 30 MG/ML IJ SOLN
30.0000 mg | Freq: Once | INTRAMUSCULAR | Status: AC
  Administered 2014-08-30: 30 mg via INTRAVENOUS
  Filled 2014-08-30: qty 1

## 2014-08-30 MED ORDER — TAMSULOSIN HCL 0.4 MG PO CAPS: 0.4000 mg | ORAL_CAPSULE | Freq: Every day | ORAL | Status: DC

## 2014-08-30 MED ORDER — LORAZEPAM 2 MG/ML IJ SOLN
1.0000 mg | Freq: Once | INTRAMUSCULAR | Status: AC
  Administered 2014-08-30: 1 mg via INTRAVENOUS
  Filled 2014-08-30: qty 1

## 2014-08-30 MED ORDER — PROMETHAZINE HCL 25 MG PO TABS: 25.0000 mg | ORAL_TABLET | Freq: Four times a day (QID) | ORAL | Status: DC | PRN

## 2014-08-30 NOTE — ED Provider Notes (Signed)
CSN: [id number]     Arrival date & time 08/30/14  0441 History   First MD Initiated Contact with Patient 08/30/14 0454     Chief Complaint  Patient presents with  . Flank Pain     (Consider location/radiation/quality/duration/timing/severity/associated sxs/prior Treatment) Patient is a 48 y.o. female presenting with flank pain. The history is provided by the patient (the pt complains of right flank pain).  Flank Pain This is a new problem. The current episode started 6 to 12 hours ago. The problem occurs constantly. The problem has not changed since onset.Associated symptoms include abdominal pain. Pertinent negatives include no chest pain and no headaches. Nothing aggravates the symptoms. Nothing relieves the symptoms.    Past Medical History  Diagnosis Date  . Kidney stones per pt.  . Bronchitis    Past Surgical History  Procedure Laterality Date  . Cesarean section    . Hernia repair     No family history on file. History  Substance Use Topics  . Smoking status: Current Every Day Smoker  . Smokeless tobacco: Not on file  . Alcohol Use: No   OB History   Grav Para Term Preterm Abortions TAB SAB Ect Mult Living                 Review of Systems  Constitutional: Negative for appetite change and fatigue.  HENT: Negative for congestion, ear discharge and sinus pressure.   Eyes: Negative for discharge.  Respiratory: Negative for cough.   Cardiovascular: Negative for chest pain.  Gastrointestinal: Positive for abdominal pain. Negative for diarrhea.  Genitourinary: Positive for flank pain. Negative for frequency and hematuria.  Musculoskeletal: Positive for back pain.  Skin: Negative for rash.  Neurological: Negative for seizures and headaches.  Psychiatric/Behavioral: Negative for hallucinations.      Allergies  Review of patient's allergies indicates no known allergies.  Home Medications   Prior to Admission medications   Medication Sig Start Date End Date  Taking? Authorizing Provider  ALPRAZolam Duanne Moron) 1 MG tablet Take 1 mg by mouth 2 (two) times daily. For pain    Historical Provider, MD  dexamethasone (DECADRON) 4 MG tablet 1 po bid with food 02/26/14   Lenox Ahr, PA-C  meloxicam (MOBIC) 7.5 MG tablet 1 po bid with food 02/26/14   Lenox Ahr, PA-C   BP 145/123  Pulse 89  Temp(Src) 98.2 F (36.8 C) (Oral)  Resp 22  Ht 5\' 5"  (1.651 m)  Wt 228 lb (103.42 kg)  BMI 37.94 kg/m2  SpO2 97%  LMP 02/16/2014 Physical Exam  Constitutional: She is oriented to person, place, and time. She appears well-developed.  HENT:  Head: Normocephalic.  Eyes: Conjunctivae and EOM are normal. No scleral icterus.  Neck: Neck supple. No thyromegaly present.  Cardiovascular: Normal rate and regular rhythm.  Exam reveals no gallop and no friction rub.   No murmur heard. Pulmonary/Chest: No stridor. She has no wheezes. She has no rales. She exhibits no tenderness.  Abdominal: She exhibits no distension. There is no tenderness. There is no rebound.  Musculoskeletal: She exhibits tenderness. She exhibits no edema.  Tender right flank  Lymphadenopathy:    She has no cervical adenopathy.  Neurological: She is oriented to person, place, and time. She exhibits normal muscle tone. Coordination normal.  Skin: No rash noted. No erythema.  Psychiatric: She has a normal mood and affect. Her behavior is normal.    ED Course  Procedures (including critical care time) Labs Review Labs  Reviewed  URINALYSIS, ROUTINE W REFLEX MICROSCOPIC - Abnormal; Notable for the following:    APPearance HAZY (*)    Hgb urine dipstick MODERATE (*)    All other components within normal limits  COMPREHENSIVE METABOLIC PANEL - Abnormal; Notable for the following:    Glucose, Bld 141 (*)    GFR calc non Af Amer 85 (*)    All other components within normal limits  URINE MICROSCOPIC-ADD ON - Abnormal; Notable for the following:    Squamous Epithelial / LPF FEW (*)    Bacteria,  UA FEW (*)    All other components within normal limits  CBC WITH DIFFERENTIAL  LIPASE, BLOOD    Imaging Review Ct Abdomen Pelvis Wo Contrast  08/30/2014   CLINICAL DATA:  RIGHT flank pain for 2 days, constant and stabbing. History of kidney stones.  EXAM: CT ABDOMEN AND PELVIS WITHOUT CONTRAST  TECHNIQUE: Multidetector CT imaging of the abdomen and pelvis was performed following the standard protocol without IV contrast.  COMPARISON:  CT of the abdomen and pelvis February 26, 2014  FINDINGS: LUNG BASES: Included view of the lung bases are clear. The visualized heart and pericardium are unremarkable.  KIDNEYS/BLADDER: Kidneys are orthotopic, demonstrating normal size and morphology. Moderate RIGHT hydroureteronephrosis the level of the proximal ureter where a 5 mm calculus is seen. Residual 4 mm RIGHT lower pole nephrolithiasis. 3 mm LEFT upper and 2 mm LEFT upper pole pole nephrolithiasis without hydronephrosis.  SOLID ORGANS: The liver, spleen, gallbladder, pancreas and RIGHT adrenal gland are unremarkable for this non-contrast examination. LEFT adrenal 2.6 cm benign adenoma (-2 Hounsfield units).  GASTROINTESTINAL TRACT: The stomach, small and large bowel are normal in course and caliber without inflammatory changes, the sensitivity may be decreased by lack of enteric contrast. Mild colonic diverticulosis. Normal appendix.  PERITONEUM/RETROPERITONEUM: No intraperitoneal free fluid nor free air. Aortoiliac vessels are normal in course and caliber. No lymphadenopathy by CT size criteria. Internal reproductive organs are unremarkable.  SOFT TISSUES/ OSSEOUS STRUCTURES: Nonsuspicious. Mild lower lumbar facet arthropathy. Small fat containing ventral hernia.  IMPRESSION: Moderate RIGHT hydroureteronephrosis to the level the proximal ureter where a 5 mm calculus is seen. Residual bilateral nephrolithiasis measuring up to 4 mm on the RIGHT.   Electronically Signed   By: Elon Alas   On: 08/30/2014 05:41      EKG Interpretation None      MDM   Final diagnoses:  None        Maudry Diego, MD 08/30/14 [phone]

## 2014-08-30 NOTE — ED Notes (Signed)
MD wrote Rx for prepack of Percocet. Rx instructions given to patient and RN handed pre-pack to patient.  Patient with no complaints at this time. Respirations even and unlabored. Skin warm/dry. Discharge instructions reviewed with patient at this time. Patient given opportunity to voice concerns/ask questions. IV removed per policy and band-aid applied to site. Patient discharged at this time and left Emergency Department with steady gait.

## 2014-08-30 NOTE — Discharge Instructions (Signed)
Follow up with [facility] this week.  Return if problems

## 2014-08-30 NOTE — ED Notes (Signed)
Pt complaining of right side flank pain for the past 3 days. Pt states she has a history of kidney stones.

## 2014-09-07 MED FILL — Oxycodone w/ Acetaminophen Tab 5-325 MG: ORAL | Qty: 6 | Status: AC

## 2014-09-25 ENCOUNTER — Other Ambulatory Visit: Payer: Self-pay | Admitting: Obstetrics & Gynecology

## 2015-05-19 ENCOUNTER — Emergency Department (HOSPITAL_COMMUNITY): Payer: Self-pay

## 2015-05-19 ENCOUNTER — Encounter (HOSPITAL_COMMUNITY): Payer: Self-pay

## 2015-05-19 ENCOUNTER — Emergency Department (HOSPITAL_COMMUNITY)
Admission: EM | Admit: 2015-05-19 | Discharge: 2015-05-19 | Disposition: A | Discharge status: Home / Self Care | Payer: Self-pay | Attending: Emergency Medicine | Admitting: Emergency Medicine

## 2015-05-19 DIAGNOSIS — S8992XA Unspecified injury of left lower leg, initial encounter: Secondary | ICD-10-CM | POA: Insufficient documentation

## 2015-05-19 DIAGNOSIS — W01198A Fall on same level from slipping, tripping and stumbling with subsequent striking against other object, initial encounter: Secondary | ICD-10-CM | POA: Insufficient documentation

## 2015-05-19 DIAGNOSIS — Y9301 Activity, walking, marching and hiking: Secondary | ICD-10-CM | POA: Insufficient documentation

## 2015-05-19 DIAGNOSIS — S24109A Unspecified injury at unspecified level of thoracic spinal cord, initial encounter: Secondary | ICD-10-CM | POA: Insufficient documentation

## 2015-05-19 DIAGNOSIS — Z8709 Personal history of other diseases of the respiratory system: Secondary | ICD-10-CM | POA: Insufficient documentation

## 2015-05-19 DIAGNOSIS — Z87442 Personal history of urinary calculi: Secondary | ICD-10-CM | POA: Insufficient documentation

## 2015-05-19 DIAGNOSIS — R531 Weakness: Secondary | ICD-10-CM | POA: Insufficient documentation

## 2015-05-19 DIAGNOSIS — Y998 Other external cause status: Secondary | ICD-10-CM | POA: Insufficient documentation

## 2015-05-19 DIAGNOSIS — W19XXXA Unspecified fall, initial encounter: Secondary | ICD-10-CM

## 2015-05-19 DIAGNOSIS — Y92512 Supermarket, store or market as the place of occurrence of the external cause: Secondary | ICD-10-CM | POA: Insufficient documentation

## 2015-05-19 DIAGNOSIS — S3992XA Unspecified injury of lower back, initial encounter: Secondary | ICD-10-CM | POA: Insufficient documentation

## 2015-05-19 DIAGNOSIS — Z720 Tobacco use: Secondary | ICD-10-CM | POA: Insufficient documentation

## 2015-05-19 DIAGNOSIS — S199XXA Unspecified injury of neck, initial encounter: Secondary | ICD-10-CM | POA: Insufficient documentation

## 2015-05-19 DIAGNOSIS — S0990XA Unspecified injury of head, initial encounter: Secondary | ICD-10-CM | POA: Insufficient documentation

## 2015-05-19 LAB — CBC WITH DIFFERENTIAL/PLATELET
Basophils Absolute: 0 10*3/uL (ref 0.0–0.1)
Basophils Relative: 0 % (ref 0–1)
Eosinophils Absolute: 0.1 10*3/uL (ref 0.0–0.7)
Eosinophils Relative: 2 % (ref 0–5)
HEMATOCRIT: 40.6 % (ref 36.0–46.0)
HEMOGLOBIN: 13.5 g/dL (ref 12.0–15.0)
LYMPHS PCT: 27 % (ref 12–46)
Lymphs Abs: 2.6 10*3/uL (ref 0.7–4.0)
MCH: 31.8 pg (ref 26.0–34.0)
MCHC: 33.3 g/dL (ref 30.0–36.0)
MCV: 95.5 fL (ref 78.0–100.0)
MONO ABS: 0.7 10*3/uL (ref 0.1–1.0)
MONOS PCT: 8 % (ref 3–12)
NEUTROS PCT: 63 % (ref 43–77)
Neutro Abs: 6.1 10*3/uL (ref 1.7–7.7)
Platelets: 326 10*3/uL (ref 150–400)
RBC: 4.25 MIL/uL (ref 3.87–5.11)
RDW: 12.3 % (ref 11.5–15.5)
WBC: 9.6 10*3/uL (ref 4.0–10.5)

## 2015-05-19 LAB — BASIC METABOLIC PANEL
ANION GAP: 6 (ref 5–15)
BUN: 11 mg/dL (ref 6–20)
CO2: 27 mmol/L (ref 22–32)
CREATININE: 0.56 mg/dL (ref 0.44–1.00)
Calcium: 9.1 mg/dL (ref 8.9–10.3)
Chloride: 107 mmol/L (ref 101–111)
GFR calc non Af Amer: >60 mL/min (ref >60)
Glucose, Bld: 95 mg/dL (ref 65–99)
Potassium: 3.8 mmol/L (ref 3.5–5.1)
Sodium: 140 mmol/L (ref 135–145)

## 2015-05-19 MED ORDER — FENTANYL CITRATE (PF) 100 MCG/2ML IJ SOLN
100.0000 ug | Freq: Once | INTRAMUSCULAR | Status: DC
  Filled 2015-05-19: qty 2

## 2015-05-19 MED ORDER — FENTANYL CITRATE (PF) 100 MCG/2ML IJ SOLN
100.0000 ug | Freq: Once | INTRAMUSCULAR | Status: AC
  Administered 2015-05-19: 100 ug via INTRAMUSCULAR

## 2015-05-19 MED ORDER — IBUPROFEN 800 MG PO TABS: 800.0000 mg | ORAL_TABLET | Freq: Three times a day (TID) | ORAL | Status: DC

## 2015-05-19 MED ORDER — HYDROCODONE-ACETAMINOPHEN 5-325 MG PO TABS
2.0000 | ORAL_TABLET | Freq: Once | ORAL | Status: AC
  Administered 2015-05-19: 2 via ORAL
  Filled 2015-05-19: qty 2

## 2015-05-19 NOTE — ED Provider Notes (Signed)
CSN: [id number]     Arrival date & time 05/19/15  1423 History   First MD Initiated Contact with Patient 05/19/15 1437     Chief Complaint  Patient presents with  . Fall     (Consider location/radiation/quality/duration/timing/severity/associated sxs/prior Treatment) HPI Comments: Patient reports mechanical fall while walking in the store. States she tripped on some dressing that was spilled on the grocery store and fell backwards striking her head. Did not lose consciousness. She does not take any chronic medications or anticoagulation. She complains of pain in the left side of her body, back and neck. She complains of pain to her left knee. No chest pain or shortness of breath. She endorses weakness of the left arm and left leg which she states is new since the fall though she has "had problems with the left side for a while". Denies any bowel or bladder incontinence. Denies any fever. Denies any dizziness or lightheadedness. Denies any previous workup for back pain.  Patient is a 49 y.o. female presenting with fall. The history is provided by the patient and the EMS personnel.  Fall Associated symptoms include headaches. Pertinent negatives include no chest pain, no abdominal pain and no shortness of breath.    Past Medical History  Diagnosis Date  . Kidney stones per pt.  . Bronchitis    Past Surgical History  Procedure Laterality Date  . Cesarean section    . Hernia repair     No family history on file. History  Substance Use Topics  . Smoking status: Current Every Day Smoker  . Smokeless tobacco: Not on file  . Alcohol Use: No   OB History    No data available     Review of Systems  Constitutional: Negative for fever, activity change and appetite change.  Respiratory: Negative for cough, chest tightness and shortness of breath.   Cardiovascular: Negative for chest pain.  Gastrointestinal: Negative for nausea, vomiting and abdominal pain.  Genitourinary: Negative for  dysuria, hematuria, vaginal bleeding and vaginal discharge.  Musculoskeletal: Positive for myalgias, back pain, arthralgias and neck pain.  Skin: Negative for rash.  Neurological: Positive for weakness, numbness and headaches. Negative for dizziness, facial asymmetry and speech difficulty.  A complete 10 system review of systems was obtained and all systems are negative except as noted in the HPI and PMH.      Allergies  Review of patient's allergies indicates no known allergies.  Home Medications   Prior to Admission medications   Medication Sig Start Date End Date Taking? Authorizing Provider  ALPRAZolam Duanne Moron) 1 MG tablet Take 1 mg by mouth 2 (two) times daily as needed for anxiety. For pain   Yes Historical Provider, MD  naproxen sodium (ALEVE) 220 MG tablet Take 220-440 mg by mouth daily as needed (for pain).   Yes Historical Provider, MD  ibuprofen (ADVIL,MOTRIN) 800 MG tablet Take 1 tablet (800 mg total) by mouth 3 (three) times daily. 05/19/15   Ezequiel Essex, MD   BP 121/71 mmHg  Pulse 62  Temp(Src) 98.2 F (36.8 C) (Oral)  Resp 22  Ht 5\' 5"  (1.651 m)  Wt 226 lb (102.513 kg)  BMI 37.61 kg/m2  SpO2 95%  LMP 02/16/2014 Physical Exam  Constitutional: She is oriented to person, place, and time. She appears well-developed and well-nourished. No distress.  HENT:  Head: Normocephalic and atraumatic.  Mouth/Throat: Oropharynx is clear and moist. No oropharyngeal exudate.  Eyes: Conjunctivae and EOM are normal. Pupils are equal, round, and reactive  to light.  Neck: Normal range of motion. Neck supple.  Diffuse C spine tenderness without stepoff C collar in place  Cardiovascular: Normal rate, regular rhythm, normal heart sounds and intact distal pulses.   No murmur heard. Pulmonary/Chest: Effort normal and breath sounds normal. No respiratory distress.  Abdominal: Soft. There is no tenderness. There is no rebound and no guarding.  Genitourinary:  Rectal tone normal.  Chaperone present  Musculoskeletal: Normal range of motion. She exhibits no edema or tenderness.  Diffuse T and L spine tenderness  Neurological: She is alert and oriented to person, place, and time. No cranial nerve deficit. She exhibits normal muscle tone. Coordination normal.  No ataxia on finger to nose bilaterally. No pronator drift. 4/5 L upper extremity and lower extremity. Decreased grip strength on L. CN 2-12 intact.   Skin: Skin is warm.  Psychiatric: She has a normal mood and affect. Her behavior is normal.  Nursing note and vitals reviewed.   ED Course  Procedures (including critical care time) Labs Review Labs Reviewed  CBC WITH DIFFERENTIAL/PLATELET  BASIC METABOLIC PANEL    Imaging Review Dg Thoracic Spine 2 View  05/19/2015   CLINICAL DATA:  49 year old female with thoracolumbar back pain radiating to the left lower extremity after or a fall on a wet floor. Initial encounter.  EXAM: THORACIC SPINE - 2-3 VIEWS  COMPARISON:  Chest radiographs 12/13/2011.  FINDINGS: Normal thoracic segmentation. Thoracic vertebral height and alignment appears stable. Relatively preserved thoracic disc spaces. Chronic mild lower thoracic endplate spurring. Posterior ribs appear grossly intact. Thoracic visceral contours appear stable. Cervicothoracic junction alignment is within normal limits.  IMPRESSION: No acute fracture or listhesis identified in the thoracic spine.   Electronically Signed   By: Genevie Ann M.D.   On: 05/19/2015 15:59   Dg Lumbar Spine Complete  05/19/2015   CLINICAL DATA:  Mid and low back pain.  EXAM: LUMBAR SPINE - COMPLETE 4+ VIEW  COMPARISON:  CT abdomen and pelvis 08/30/2014.  FINDINGS: Vertebral body height and alignment are maintained. There is some loss of disc space height from L3-4 to L5-S1 with facet degenerative change also seen at these levels. Paraspinous structures demonstrate stones in the right kidney measuring up to approximately 0.6 cm in diameter. A punctate  calcification immediately adjacent to the left L4 transverse process is also identified and could represent a ureteral stone.  IMPRESSION: No acute abnormality.  Lumbar spondylosis.  Possible left ureteral stone.  Right renal stones also identified.   Electronically Signed   By: Inge Rise M.D.   On: 05/19/2015 15:55   Ct Head Wo Contrast  05/19/2015   CLINICAL DATA:  Patient slipped and fell, complaining of pain and left-sided weakness  EXAM: CT HEAD WITHOUT CONTRAST  CT CERVICAL SPINE WITHOUT CONTRAST  TECHNIQUE: Multidetector CT imaging of the head and cervical spine was performed following the standard protocol without intravenous contrast. Multiplanar CT image reconstructions of the cervical spine were also generated.  COMPARISON:  Head CT May 09, 2006; cervical spine CT February 26, 2014  FINDINGS: CT HEAD FINDINGS  The ventricles are normal in size and configuration. There is no intracranial mass, hemorrhage, extra-axial fluid collection, or midline shift. Gray-white compartments are normal. There is no acute infarct. The bony calvarium appears intact. The mastoid air cells are clear. There is mucosal thickening in the right maxillary antrum.  CT CERVICAL SPINE FINDINGS  There is no fracture or spondylolisthesis. Prevertebral soft tissues and predental space regions are normal. Disc  spaces appear intact. No disc extrusion or stenosis. There is a bone island in the lamina of C7 on the right, stable. There are foci of calcification in each carotid artery.  IMPRESSION: CT head: Right maxillary sinus disease. No intracranial mass, hemorrhage, or extra-axial fluid. No gray - white compartment lesions.  CT cervical spine: No fracture or spondylolisthesis. No appreciable arthropathy. Stable bone island in the C7 lamina on the right. Foci of carotid artery calcification bilaterally.   Electronically Signed   By: Lowella Grip III M.D.   On: 05/19/2015 15:24   Ct Cervical Spine Wo Contrast  05/19/2015   :  Head CT and cervical spine CT reports are combined into a single dictation.   Electronically Signed   By: Lowella Grip III M.D.   On: 05/19/2015 15:25   Mr Brain Wo Contrast  05/19/2015   CLINICAL DATA:  Patient fell earlier today at [facility]. Left-sided pain from head to foot.  EXAM: MRI HEAD WITHOUT CONTRAST  TECHNIQUE: Multiplanar, multiecho pulse sequences of the brain and surrounding structures were obtained without intravenous contrast.  COMPARISON:  CT head earlier today.  FINDINGS: The patient was unable to remain motionless for the exam. Small or subtle lesions could be overlooked. No evidence for acute infarction, hemorrhage, mass lesion, hydrocephalus, or extra-axial fluid. Normal cerebral volume. No white matter disease.  Pituitary, pineal, and cerebellar tonsils unremarkable. No upper cervical lesions. Flow voids are maintained throughout the carotid, basilar, and vertebral arteries. There are no areas of chronic hemorrhage. Visualized calvarium, skull base, and upper cervical osseous structures unremarkable. Scalp and extracranial soft tissues, orbits, and mastoids show no acute process. Layering fluid in the RIGHT maxillary sinus is accompanied by slight mucosal thickening, and is felt to represent an inflammatory, not traumatic process.  IMPRESSION: Motion degraded examination demonstrating no definite acute intracranial process.  No focal abnormality or posttraumatic sequelae are evident.   Electronically Signed   By: Staci Righter M.D.   On: 05/19/2015 20:41   Mr Cervical Spine Wo Contrast  05/19/2015   CLINICAL DATA:  Patient fell earlier today. Pain. Left-sided symptoms.  EXAM: MRI CERVICAL SPINE WITHOUT CONTRAST  TECHNIQUE: Multiplanar, multisequence MR imaging of the cervical spine was performed. No intravenous contrast was administered.  COMPARISON:  CT of the cervical spine earlier today.  FINDINGS: The patient was unable to remain motionless for the exam. Small or subtle  lesions could be overlooked.  No evidence for disc degeneration, disc herniation, vertebral body abnormality, or paraspinous mass. Mild straightening of the normal cervical lordosis could reflect positioning or spasm. No prevertebral soft tissue swelling. No cervical spine fracture. Craniocervical junction unremarkable. Normal cervical cord. Normal gradient sequence without intraspinal or intramedullary hematoma.  Axial images confirm lack of neural impingement.  IMPRESSION: Motion degraded exam demonstrating no acute or focal intracranial abnormality.  Specifically, no evidence for occult cervical spine fracture, traumatic subluxation, or intraspinal hematoma.   Electronically Signed   By: Staci Righter M.D.   On: 05/19/2015 20:21   Mr Thoracic Spine Wo Contrast  05/19/2015   CLINICAL DATA:  [place] earlier today. LEFT-sided pain from head to foot.  EXAM: MRI THORACIC SPINE WITHOUT CONTRAST  TECHNIQUE: Multiplanar, multisequence MR imaging of the thoracic spine was performed. No intravenous contrast was administered.  COMPARISON:  Thoracic plain films earlier today.  FINDINGS: Numbering of the thoracic spine was accomplished by counting down from the odontoid. There is considerable motion degradation despite best efforts of patient and technologist.  No evidence for disc degeneration, disc herniation, vertebral body abnormality, or paraspinous mass. 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  Axial images demonstrate normal cord size and signal throughout. No intraspinal or intramedullary hematoma or mass lesion. Visualized paravertebral soft tissues are unremarkable.  Incidental RIGHT low thyroid cyst is suspected, 12 mm in long axis. Recommend elective thyroid ultrasound for further evaluation  IMPRESSION: Motion degraded examination demonstrating no evidence for thoracic spine injury. No intraspinal hematoma is evident. No traumatic disc protrusion is seen.  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 Recommend thyroid ultrasound on an elective basis   Electronically Signed   By: Staci Righter M.D.   On: 05/19/2015 20:33   Mr Lumbar Spine Wo Contrast  05/19/2015   CLINICAL DATA:  [place] earlier today, left-sided pain from head to foot.  EXAM: MRI LUMBAR SPINE WITHOUT CONTRAST  TECHNIQUE: Multiplanar, multisequence MR imaging of the lumbar spine was performed. No intravenous contrast was administered.  COMPARISON:  Plain films earlier today.  FINDINGS: The patient was unable to remain motionless for the exam. Small or subtle lesions could be overlooked.  There is no evidence for lumbar spine fracture or traumatic subluxation. No worrisome osseous lesions. Normal conus. No intraspinal hematoma or mass.  At L5-S1, there is a shallow central protrusion. This projects between both S1 nerve roots and does not result in impingement. Mild lower lumbar facet arthropathy is also observed, worst at L4-5.  Possible soft tissue contusion in the upper lumbar region subcutaneous adipose tissue dorsally. No evidence for spinous process fracture or osseous injury.  IMPRESSION: No lumbar spine fracture or traumatic subluxation.  L5-S1 protrusion, with lower lumbar facet arthropathy, incidental to the acute injury.  Possible soft tissue contusion in the upper lumbar region, without osseous injury.   Electronically Signed   By: Staci Righter M.D.   On: 05/19/2015 20:37   Dg Knee Complete 4 Views Left  05/19/2015   CLINICAL DATA:  Posterior left knee pain.  EXAM: LEFT KNEE - COMPLETE 4+ VIEW  COMPARISON:  None.  FINDINGS: There is no evidence of fracture, dislocation, or joint effusion. There is no evidence of arthropathy or other focal bone abnormality. Soft tissues are unremarkable.  IMPRESSION: Negative.   Electronically Signed   By: Rolm Baptise M.D.   On: 05/19/2015 15:53     EKG Interpretation None      MDM   Final diagnoses:  Left-sided weakness  Fall, initial encounter   mechanical fall striking  head and back. No loss of consciousness. Patient now with left-sided weakness in arm and leg. No facial asymmetry. Tongue is midline.  CT head and C spine negative.  With neck and back pain and L sided weakness that patient states is new since fall, will proceed with MRI of spine.  MRI results reviewed with Dr. Leonel Ramsay of neurology.  He agrees there is no stroke or evidence of spinal cord injury. He feels if patient can walk, she can be discharged.  He does not recommended CTA neck with negative Ct and MRI.  Patient is able to ambulate.  Some L sided weakness persists but seems to come and go with effort.   Extensive imaging is negative for stroke or spinal cord pathology.  Refer to neurology for followup. Return precautions discussed. Advised to followup for thyroid US.  Ezequiel Essex, MD 05/19/15 2312

## 2015-05-19 NOTE — ED Notes (Signed)
Pt states she slipped in something that was in the floor at a local store. Complain of pain from left foot up to the shoulder and across lower back

## 2015-05-19 NOTE — ED Notes (Signed)
MD Rancour at bedside 

## 2015-05-19 NOTE — Discharge Instructions (Signed)
Fall Prevention and Home Safety Your testing does not show any evidence of stroke or spinal cord injury. You need to have an ultrasound of your thyroid. Return to the ED if you develop new or worsening symptoms. Falls cause injuries and can affect all age groups. It is possible to use preventive measures to significantly decrease the likelihood of falls. There are many simple measures which can make your home safer and prevent falls. OUTDOORS  Repair cracks and edges of walkways and driveways.  Remove high doorway thresholds.  Trim shrubbery on the main path into your home.  Have good outside lighting.  Clear walkways of tools, rocks, debris, and clutter.  Check that handrails are not broken and are securely fastened. Both sides of steps should have handrails.  Have leaves, snow, and ice cleared regularly.  Use sand or salt on walkways during winter months.  In the garage, clean up grease or oil spills. BATHROOM  Install night lights.  Install grab bars by the toilet and in the tub and shower.  Use non-skid mats or decals in the tub or shower.  Place a plastic non-slip stool in the shower to sit on, if needed.  Keep floors dry and clean up all water on the floor immediately.  Remove soap buildup in the tub or shower on a regular basis.  Secure bath mats with non-slip, double-sided rug tape.  Remove throw rugs and tripping hazards from the floors. BEDROOMS  Install night lights.  Make sure a bedside light is easy to reach.  Do not use oversized bedding.  Keep a telephone by your bedside.  Have a firm chair with side arms to use for getting dressed.  Remove throw rugs and tripping hazards from the floor. KITCHEN  Keep handles on pots and pans turned toward the center of the stove. Use back burners when possible.  Clean up spills quickly and allow time for drying.  Avoid walking on wet floors.  Avoid hot utensils and knives.  Position shelves so they are not  too high or low.  Place commonly used objects within easy reach.  If necessary, use a sturdy step stool with a grab bar when reaching.  Keep electrical cables out of the way.  Do not use floor polish or wax that makes floors slippery. If you must use wax, use non-skid floor wax.  Remove throw rugs and tripping hazards from the floor. STAIRWAYS  Never leave objects on stairs.  Place handrails on both sides of stairways and use them. Fix any loose handrails. Make sure handrails on both sides of the stairways are as long as the stairs.  Check carpeting to make sure it is firmly attached along stairs. Make repairs to worn or loose carpet promptly.  Avoid placing throw rugs at the top or bottom of stairways, or properly secure the rug with carpet tape to prevent slippage. Get rid of throw rugs, if possible.  Have an electrician put in a light switch at the top and bottom of the stairs. OTHER FALL PREVENTION TIPS  Wear low-heel or rubber-soled shoes that are supportive and fit well. Wear closed toe shoes.  When using a stepladder, make sure it is fully opened and both spreaders are firmly locked. Do not climb a closed stepladder.  Add color or contrast paint or tape to grab bars and handrails in your home. Place contrasting color strips on first and last steps.  Learn and use mobility aids as needed. Install an electrical emergency response system.  Turn on lights to avoid dark areas. Replace light bulbs that burn out immediately. Get light switches that glow.  Arrange furniture to create clear pathways. Keep furniture in the same place.  Firmly attach carpet with non-skid or double-sided tape.  Eliminate uneven floor surfaces.  Select a carpet pattern that does not visually hide the edge of steps.  Be aware of all pets. OTHER HOME SAFETY TIPS  Set the water temperature for 120 F (48.8 C).  Keep emergency numbers on or near the telephone.  Keep smoke detectors on every  level of the home and near sleeping areas. Document Released: 10/20/2002 Document Revised: 04/30/2012 Document Reviewed: 01/19/2012 [facility] Patient Information 2015 [place], [place]. This information is not intended to replace advice given to you by your health care provider. Make sure you discuss any questions you have with your health care provider.

## 2015-05-19 NOTE — ED Notes (Addendum)
Pt ambulated to bathroom with NT Sonia assist. Pt has unsteady gait. MD Rancour made aware.

## 2015-05-19 NOTE — ED Notes (Signed)
Patient assisted to restroom, ambulated well with some back pain.

## 2015-05-21 ENCOUNTER — Emergency Department (HOSPITAL_COMMUNITY)
Admission: EM | Admit: 2015-05-21 | Discharge: 2015-05-21 | Disposition: A | Discharge status: Home / Self Care | Payer: Self-pay | Attending: Emergency Medicine | Admitting: Emergency Medicine

## 2015-05-21 ENCOUNTER — Encounter (HOSPITAL_COMMUNITY): Payer: Self-pay | Admitting: Emergency Medicine

## 2015-05-21 DIAGNOSIS — Z87828 Personal history of other (healed) physical injury and trauma: Secondary | ICD-10-CM | POA: Insufficient documentation

## 2015-05-21 DIAGNOSIS — M62830 Muscle spasm of back: Secondary | ICD-10-CM | POA: Insufficient documentation

## 2015-05-21 DIAGNOSIS — Z87442 Personal history of urinary calculi: Secondary | ICD-10-CM | POA: Insufficient documentation

## 2015-05-21 DIAGNOSIS — Z791 Long term (current) use of non-steroidal anti-inflammatories (NSAID): Secondary | ICD-10-CM | POA: Insufficient documentation

## 2015-05-21 DIAGNOSIS — Z8709 Personal history of other diseases of the respiratory system: Secondary | ICD-10-CM | POA: Insufficient documentation

## 2015-05-21 DIAGNOSIS — Z720 Tobacco use: Secondary | ICD-10-CM | POA: Insufficient documentation

## 2015-05-21 MED ORDER — CYCLOBENZAPRINE HCL 10 MG PO TABS
5.0000 mg | ORAL_TABLET | Freq: Once | ORAL | Status: AC
Start: 2015-05-21 — End: 2015-05-21
  Administered 2015-05-21: 5 mg via ORAL
  Filled 2015-05-21: qty 1

## 2015-05-21 MED ORDER — CYCLOBENZAPRINE HCL 10 MG PO TABS: 10.0000 mg | ORAL_TABLET | Freq: Every day | ORAL | Status: DC

## 2015-05-21 NOTE — Discharge Instructions (Signed)
SEEK IMMEDIATE MEDICAL ATTENTION IF: New  weakness, or problem with the use of your arms or legs.  Change in bowel or bladder control (if you lose control of stool or urine, or if you are unable to urinate) Increasing pain in any areas of the body (such as chest or abdominal pain).  Shortness of breath, dizziness or fainting.  Nausea (feeling sick to your stomach), vomiting, fever, or sweats.

## 2015-05-21 NOTE — ED Notes (Signed)
Pt was was previously seen 2 days ago for fall and back injury, back this morning for back pain and left hip pain, left shoulder pain.

## 2015-05-21 NOTE — ED Provider Notes (Signed)
CSN: [id number]     Arrival date & time 05/21/15  0431 History   First MD Initiated Contact with Patient 05/21/15 [phone]     Chief Complaint  Patient presents with  . Back Pain    Patient is a 49 y.o. female presenting with back pain. The history is provided by the patient.  Back Pain Location:  Lumbar spine Quality:  Aching Radiates to:  L posterior upper leg Pain severity:  Moderate Onset quality:  Gradual Duration:  2 days Timing:  Constant Progression:  Worsening Chronicity:  New Relieved by:  Nothing Worsened by:  Movement Associated symptoms: no bladder incontinence and no bowel incontinence     Past Medical History  Diagnosis Date  . Kidney stones per pt.  . Bronchitis    Past Surgical History  Procedure Laterality Date  . Cesarean section    . Hernia repair     History reviewed. No pertinent family history. History  Substance Use Topics  . Smoking status: Current Every Day Smoker  . Smokeless tobacco: Not on file  . Alcohol Use: No   OB History    No data available     Review of Systems  Gastrointestinal: Negative for bowel incontinence.  Genitourinary: Negative for bladder incontinence.  Musculoskeletal: Positive for back pain.      Allergies  Review of patient's allergies indicates no known allergies.  Home Medications   Prior to Admission medications   Medication Sig Start Date End Date Taking? Authorizing Provider  ALPRAZolam Duanne Moron) 1 MG tablet Take 1 mg by mouth 2 (two) times daily as needed for anxiety. For pain   Yes Historical Provider, MD  ibuprofen (ADVIL,MOTRIN) 800 MG tablet Take 1 tablet (800 mg total) by mouth 3 (three) times daily. 05/19/15  Yes Ezequiel Essex, MD  naproxen sodium (ALEVE) 220 MG tablet Take 220-440 mg by mouth daily as needed (for pain).   Yes Historical Provider, MD  cyclobenzaprine (FLEXERIL) 10 MG tablet Take 1 tablet (10 mg total) by mouth at bedtime. 05/21/15   Ripley Fraise, MD   BP 139/102 mmHg  Pulse 92   Temp(Src) 97.7 F (36.5 C) (Oral)  Resp 20  Ht 5\' 5"  (1.651 m)  Wt 226 lb (102.513 kg)  BMI 37.61 kg/m2  SpO2 98%  LMP 02/16/2014 Physical Exam CONSTITUTIONAL: Well developed/well nourished HEAD: Normocephalic/atraumatic EYES: EOMI/PERRL ENMT: Mucous membranes moist NECK: supple no meningeal signs SPINE/BACK:entire spine nontender, lumbar paraspinal tenderness, No bruising/crepitance/stepoffs noted to spine CV: S1/S2 noted, no murmurs/rubs/gallops noted LUNGS: Lungs are clear to auscultation bilaterally, no apparent distress ABDOMEN: soft, nontender, no rebound or guarding GU:no cva tenderness NEURO: Awake/alert,  Equal power with hand grip, wrist flex/extension, elbow flex/extension, and equal power with shoulder abduction/adduction.  Equal (2+) biceps/brachioradialis/tricep reflex in bilateral UE equal motor 5/5 strength noted with the following: hip flexion/knee flexion/extension, foot dorsi/plantar flexion, great toe extension intact bilaterally, no clonus bilaterally, plantar reflex appropriate (toes downgoing), no sensory deficit in any dermatome.  Equal patellar/achilles reflex noted (2+) in bilateral lower extremities.  Pt is able to ambulate unassisted. EXTREMITIES: pulses normal, full ROM, All extremities/joints palpated/ranged and nontender SKIN: warm, color normal PSYCH: no abnormalities of mood noted, alert and oriented to situation    ED Course  Procedures  Pt returns for continued pain after mechanical fall on 05/19/15 Pt had multiple imaging modalities of spine on that date that did not reveal any acute surgical pathology No new falls since that time She reports continued pain that radiates into  left LE I don't feel repeat imaging required Short course of muscle relaxant ordered (pt reports spasms at times) Referred to PCP and NSGY  Imaging Review Dg Thoracic Spine 2 View  05/19/2015   CLINICAL DATA:  49 year old female with thoracolumbar back pain radiating to  the left lower extremity after or a fall on a wet floor. Initial encounter.  EXAM: THORACIC SPINE - 2-3 VIEWS  COMPARISON:  Chest radiographs 12/13/2011.  FINDINGS: Normal thoracic segmentation. Thoracic vertebral height and alignment appears stable. Relatively preserved thoracic disc spaces. Chronic mild lower thoracic endplate spurring. Posterior ribs appear grossly intact. Thoracic visceral contours appear stable. Cervicothoracic junction alignment is within normal limits.  IMPRESSION: No acute fracture or listhesis identified in the thoracic spine.   Electronically Signed   By: Genevie Ann M.D.   On: 05/19/2015 15:59   Dg Lumbar Spine Complete  05/19/2015   CLINICAL DATA:  Mid and low back pain.  EXAM: LUMBAR SPINE - COMPLETE 4+ VIEW  COMPARISON:  CT abdomen and pelvis 08/30/2014.  FINDINGS: Vertebral body height and alignment are maintained. There is some loss of disc space height from L3-4 to L5-S1 with facet degenerative change also seen at these levels. Paraspinous structures demonstrate stones in the right kidney measuring up to approximately 0.6 cm in diameter. A punctate calcification immediately adjacent to the left L4 transverse process is also identified and could represent a ureteral stone.  IMPRESSION: No acute abnormality.  Lumbar spondylosis.  Possible left ureteral stone.  Right renal stones also identified.   Electronically Signed   By: Inge Rise M.D.   On: 05/19/2015 15:55   Ct Head Wo Contrast  05/19/2015   CLINICAL DATA:  Patient slipped and fell, complaining of pain and left-sided weakness  EXAM: CT HEAD WITHOUT CONTRAST  CT CERVICAL SPINE WITHOUT CONTRAST  TECHNIQUE: Multidetector CT imaging of the head and cervical spine was performed following the standard protocol without intravenous contrast. Multiplanar CT image reconstructions of the cervical spine were also generated.  COMPARISON:  Head CT May 09, 2006; cervical spine CT February 26, 2014  FINDINGS: CT HEAD FINDINGS  The  ventricles are normal in size and configuration. There is no intracranial mass, hemorrhage, extra-axial fluid collection, or midline shift. Gray-white compartments are normal. There is no acute infarct. The bony calvarium appears intact. The mastoid air cells are clear. There is mucosal thickening in the right maxillary antrum.  CT CERVICAL SPINE FINDINGS  There is no fracture or spondylolisthesis. Prevertebral soft tissues and predental space regions are normal. Disc spaces appear intact. No disc extrusion or stenosis. There is a bone island in the lamina of C7 on the right, stable. There are foci of calcification in each carotid artery.  IMPRESSION: CT head: Right maxillary sinus disease. No intracranial mass, hemorrhage, or extra-axial fluid. No gray - white compartment lesions.  CT cervical spine: No fracture or spondylolisthesis. No appreciable arthropathy. Stable bone island in the C7 lamina on the right. Foci of carotid artery calcification bilaterally.   Electronically Signed   By: Lowella Grip III M.D.   On: 05/19/2015 15:24   Ct Cervical Spine Wo Contrast  05/19/2015   : Head CT and cervical spine CT reports are combined into a single dictation.   Electronically Signed   By: Lowella Grip III M.D.   On: 05/19/2015 15:25   Mr Brain Wo Contrast  05/19/2015   CLINICAL DATA:  Patient fell earlier today at [facility]. Left-sided pain from head to foot.  EXAM: MRI HEAD WITHOUT CONTRAST  TECHNIQUE: Multiplanar, multiecho pulse sequences of the brain and surrounding structures were obtained without intravenous contrast.  COMPARISON:  CT head earlier today.  FINDINGS: The patient was unable to remain motionless for the exam. Small or subtle lesions could be overlooked. No evidence for acute infarction, hemorrhage, mass lesion, hydrocephalus, or extra-axial fluid. Normal cerebral volume. No white matter disease.  Pituitary, pineal, and cerebellar tonsils unremarkable. No upper cervical lesions. Flow  voids are maintained throughout the carotid, basilar, and vertebral arteries. There are no areas of chronic hemorrhage. Visualized calvarium, skull base, and upper cervical osseous structures unremarkable. Scalp and extracranial soft tissues, orbits, and mastoids show no acute process. Layering fluid in the RIGHT maxillary sinus is accompanied by slight mucosal thickening, and is felt to represent an inflammatory, not traumatic process.  IMPRESSION: Motion degraded examination demonstrating no definite acute intracranial process.  No focal abnormality or posttraumatic sequelae are evident.   Electronically Signed   By: Staci Righter M.D.   On: 05/19/2015 20:41   Mr Cervical Spine Wo Contrast  05/19/2015   CLINICAL DATA:  Patient fell earlier today. Pain. Left-sided symptoms.  EXAM: MRI CERVICAL SPINE WITHOUT CONTRAST  TECHNIQUE: Multiplanar, multisequence MR imaging of the cervical spine was performed. No intravenous contrast was administered.  COMPARISON:  CT of the cervical spine earlier today.  FINDINGS: The patient was unable to remain motionless for the exam. Small or subtle lesions could be overlooked.  No evidence for disc degeneration, disc herniation, vertebral body abnormality, or paraspinous mass. Mild straightening of the normal cervical lordosis could reflect positioning or spasm. No prevertebral soft tissue swelling. No cervical spine fracture. Craniocervical junction unremarkable. Normal cervical cord. Normal gradient sequence without intraspinal or intramedullary hematoma.  Axial images confirm lack of neural impingement.  IMPRESSION: Motion degraded exam demonstrating no acute or focal intracranial abnormality.  Specifically, no evidence for occult cervical spine fracture, traumatic subluxation, or intraspinal hematoma.   Electronically Signed   By: Staci Righter M.D.   On: 05/19/2015 20:21   Mr Thoracic Spine Wo Contrast  05/19/2015   CLINICAL DATA:  [place] earlier today. LEFT-sided pain from  head to foot.  EXAM: MRI THORACIC SPINE WITHOUT CONTRAST  TECHNIQUE: Multiplanar, multisequence MR imaging of the thoracic spine was performed. No intravenous contrast was administered.  COMPARISON:  Thoracic plain films earlier today.  FINDINGS: Numbering of the thoracic spine was accomplished by counting down from the odontoid. There is considerable motion degradation despite best efforts of patient and technologist.  No evidence for disc degeneration, disc herniation, vertebral body abnormality, or paraspinous mass. 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  Axial images demonstrate normal cord size and signal throughout. No intraspinal or intramedullary hematoma or mass lesion. Visualized paravertebral soft tissues are unremarkable.  Incidental RIGHT low thyroid cyst is suspected, 12 mm in long axis. Recommend elective thyroid ultrasound for further evaluation  IMPRESSION: Motion degraded examination demonstrating no evidence for thoracic spine injury. No intraspinal hematoma is evident. No traumatic disc protrusion is seen.  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 Recommend thyroid ultrasound on an elective basis   Electronically Signed   By: Staci Righter M.D.   On: 05/19/2015 20:33   Mr Lumbar Spine Wo Contrast  05/19/2015   CLINICAL DATA:  [place] earlier today, left-sided pain from head to foot.  EXAM: MRI LUMBAR SPINE WITHOUT CONTRAST  TECHNIQUE: Multiplanar, multisequence MR imaging of the lumbar spine was performed. No  intravenous contrast was administered.  COMPARISON:  Plain films earlier today.  FINDINGS: The patient was unable to remain motionless for the exam. Small or subtle lesions could be overlooked.  There is no evidence for lumbar spine fracture or traumatic subluxation. No worrisome osseous lesions. Normal conus. No intraspinal hematoma or mass.  At L5-S1, there is a shallow central protrusion. This projects between both S1 nerve roots and does not result in  impingement. Mild lower lumbar facet arthropathy is also observed, worst at L4-5.  Possible soft tissue contusion in the upper lumbar region subcutaneous adipose tissue dorsally. No evidence for spinous process fracture or osseous injury.  IMPRESSION: No lumbar spine fracture or traumatic subluxation.  L5-S1 protrusion, with lower lumbar facet arthropathy, incidental to the acute injury.  Possible soft tissue contusion in the upper lumbar region, without osseous injury.   Electronically Signed   By: Staci Righter M.D.   On: 05/19/2015 20:37   Dg Knee Complete 4 Views Left  05/19/2015   CLINICAL DATA:  Posterior left knee pain.  EXAM: LEFT KNEE - COMPLETE 4+ VIEW  COMPARISON:  None.  FINDINGS: There is no evidence of fracture, dislocation, or joint effusion. There is no evidence of arthropathy or other focal bone abnormality. Soft tissues are unremarkable.  IMPRESSION: Negative.   Electronically Signed   By: Rolm Baptise M.D.   On: 05/19/2015 15:53   Medications  cyclobenzaprine (FLEXERIL) tablet 5 mg (5 mg Oral Given 05/21/15 0515)     MDM   Final diagnoses:  Muscle spasm of back    Nursing notes including past medical history and social history reviewed and considered in documentation Previous records reviewed and considered     Ripley Fraise, MD 05/21/15 [phone]

## 2015-06-17 ENCOUNTER — Other Ambulatory Visit (HOSPITAL_COMMUNITY): Payer: Self-pay | Admitting: Nurse Practitioner

## 2015-06-17 DIAGNOSIS — E041 Nontoxic single thyroid nodule: Secondary | ICD-10-CM

## 2015-06-24 ENCOUNTER — Ambulatory Visit (HOSPITAL_COMMUNITY)
Admission: RE | Admit: 2015-06-24 | Discharge: 2015-06-24 | Disposition: A | Discharge status: Home / Self Care | Payer: Self-pay | Source: Ambulatory Visit | Attending: Nurse Practitioner | Admitting: Nurse Practitioner

## 2015-06-24 DIAGNOSIS — E042 Nontoxic multinodular goiter: Secondary | ICD-10-CM | POA: Insufficient documentation

## 2015-06-24 DIAGNOSIS — E041 Nontoxic single thyroid nodule: Secondary | ICD-10-CM

## 2015-07-09 ENCOUNTER — Emergency Department (HOSPITAL_COMMUNITY)
Admission: EM | Admit: 2015-07-09 | Discharge: 2015-07-09 | Disposition: A | Discharge status: Home / Self Care | Payer: Self-pay | Attending: Emergency Medicine | Admitting: Emergency Medicine

## 2015-07-09 ENCOUNTER — Encounter (HOSPITAL_COMMUNITY): Payer: Self-pay | Admitting: *Deleted

## 2015-07-09 DIAGNOSIS — Z79899 Other long term (current) drug therapy: Secondary | ICD-10-CM | POA: Insufficient documentation

## 2015-07-09 DIAGNOSIS — Z87891 Personal history of nicotine dependence: Secondary | ICD-10-CM | POA: Insufficient documentation

## 2015-07-09 DIAGNOSIS — N200 Calculus of kidney: Secondary | ICD-10-CM | POA: Insufficient documentation

## 2015-07-09 DIAGNOSIS — Z8709 Personal history of other diseases of the respiratory system: Secondary | ICD-10-CM | POA: Insufficient documentation

## 2015-07-09 DIAGNOSIS — R319 Hematuria, unspecified: Secondary | ICD-10-CM | POA: Insufficient documentation

## 2015-07-09 LAB — URINALYSIS, ROUTINE W REFLEX MICROSCOPIC
Bilirubin Urine: NEGATIVE
Glucose, UA: NEGATIVE mg/dL
Ketones, ur: NEGATIVE mg/dL
Leukocytes, UA: NEGATIVE
Nitrite: NEGATIVE
Specific Gravity, Urine: >1.03 — ABNORMAL HIGH (ref 1.005–1.030)
Urobilinogen, UA: 0.2 mg/dL (ref 0.0–1.0)
pH: 6 (ref 5.0–8.0)

## 2015-07-09 LAB — URINE MICROSCOPIC-ADD ON

## 2015-07-09 LAB — I-STAT CHEM 8, ED
BUN: 11 mg/dL (ref 6–20)
Calcium, Ion: 1.23 mmol/L (ref 1.12–1.23)
Chloride: 105 mmol/L (ref 101–111)
Creatinine, Ser: 0.9 mg/dL (ref 0.44–1.00)
Glucose, Bld: 150 mg/dL — ABNORMAL HIGH (ref 65–99)
HCT: 45 % (ref 36.0–46.0)
Hemoglobin: 15.3 g/dL — ABNORMAL HIGH (ref 12.0–15.0)
Potassium: 4.1 mmol/L (ref 3.5–5.1)
Sodium: 139 mmol/L (ref 135–145)
TCO2: 21 mmol/L (ref 0–100)

## 2015-07-09 MED ORDER — HYDROMORPHONE HCL 1 MG/ML IJ SOLN
1.0000 mg | Freq: Once | INTRAMUSCULAR | Status: DC
  Filled 2015-07-09: qty 1

## 2015-07-09 MED ORDER — HYDROMORPHONE HCL 1 MG/ML IJ SOLN
1.0000 mg | Freq: Once | INTRAMUSCULAR | Status: AC
  Administered 2015-07-09: 1 mg via INTRAVENOUS
  Filled 2015-07-09: qty 1

## 2015-07-09 MED ORDER — PROMETHAZINE HCL 12.5 MG PO TABS
25.0000 mg | ORAL_TABLET | Freq: Once | ORAL | Status: AC
  Administered 2015-07-09: 25 mg via ORAL
  Filled 2015-07-09: qty 2

## 2015-07-09 MED ORDER — HYDROMORPHONE HCL 1 MG/ML IJ SOLN
1.0000 mg | Freq: Once | INTRAMUSCULAR | Status: AC
Start: 2015-07-09 — End: 2015-07-09
  Administered 2015-07-09: 1 mg via INTRAVENOUS

## 2015-07-09 MED ORDER — KETOROLAC TROMETHAMINE 30 MG/ML IJ SOLN
30.0000 mg | Freq: Once | INTRAMUSCULAR | Status: AC
  Administered 2015-07-09: 30 mg via INTRAVENOUS

## 2015-07-09 MED ORDER — OXYCODONE-ACETAMINOPHEN 5-325 MG PO TABS
2.0000 | ORAL_TABLET | Freq: Once | ORAL | Status: AC
  Administered 2015-07-09: 2 via ORAL
  Filled 2015-07-09: qty 2

## 2015-07-09 MED ORDER — ONDANSETRON 4 MG PO TBDP
4.0000 mg | ORAL_TABLET | Freq: Once | ORAL | Status: AC
  Administered 2015-07-09: 4 mg via ORAL
  Filled 2015-07-09: qty 1

## 2015-07-09 MED ORDER — KETOROLAC TROMETHAMINE 60 MG/2ML IM SOLN
60.0000 mg | Freq: Once | INTRAMUSCULAR | Status: DC
  Filled 2015-07-09: qty 2

## 2015-07-09 MED ORDER — TAMSULOSIN HCL 0.4 MG PO CAPS: 0.4000 mg | ORAL_CAPSULE | Freq: Once | ORAL | Status: DC

## 2015-07-09 MED ORDER — ONDANSETRON 4 MG PO TBDP: 4.0000 mg | ORAL_TABLET | Freq: Three times a day (TID) | ORAL | Status: DC | PRN

## 2015-07-09 MED ORDER — OXYCODONE-ACETAMINOPHEN 5-325 MG PO TABS: 2.0000 | ORAL_TABLET | Freq: Four times a day (QID) | ORAL | Status: DC | PRN

## 2015-07-09 MED ORDER — TAMSULOSIN HCL 0.4 MG PO CAPS
0.4000 mg | ORAL_CAPSULE | Freq: Once | ORAL | Status: AC
  Administered 2015-07-09: 0.4 mg via ORAL
  Filled 2015-07-09: qty 1

## 2015-07-09 NOTE — ED Notes (Signed)
Pt with nausea and right flank pain, hx of kidney stones, +hemauria

## 2015-07-09 NOTE — ED Notes (Signed)
Patient vomiting upon D/C. Per Dr. Rex Kras- give 25mg  of phenergan PO now.

## 2015-07-09 NOTE — Discharge Instructions (Signed)
°Emergency Department Resource Guide °1) Find a Doctor and Pay Out of Pocket °Although you won't have to find out who is covered by your insurance plan, it is a good idea to ask around and get recommendations. You will then need to call the office and see if the doctor you have chosen will accept you as a new patient and what types of options they offer for patients who are self-pay. Some doctors offer discounts or will set up payment plans for their patients who do not have insurance, but you will need to ask so you aren't surprised when you get to your appointment. ° °2) Contact Your Local Health Department °Not all health departments have doctors that can see patients for sick visits, but many do, so it is worth a call to see if yours does. If you don't know where your local health department is, you can check in your phone book. The CDC also has a tool to help you locate your state's health department, and many state websites also have listings of all of their local health departments. ° °3) Find a Walk-in Clinic °If your illness is not likely to be very severe or complicated, you may want to try a walk in clinic. These are popping up all over the country in pharmacies, drugstores, and shopping centers. They're usually staffed by nurse practitioners or physician assistants that have been trained to treat common illnesses and complaints. They're usually fairly quick and inexpensive. However, if you have serious medical issues or chronic medical problems, these are probably not your best option. ° °No Primary Care Doctor: °- Call [facility] at  832-8000 - they can help you locate a primary care doctor that  accepts your insurance, provides certain services, etc. °- Physician Referral Service- 1-800-533-3463 ° °Chronic Pain Problems: °Organization         Address  Phone   Notes  °[facility]  (336) 297-2271 Patients need to be referred by their primary care doctor.  ° °Medication  Assistance: °Organization         Address  Phone   Notes  °[place] [facility] [address] °[place], [place] [address] (336) 641-8030 --Must be a resident of [place] °-- Must have NO insurance coverage whatsoever (no Medicaid/ Medicare, etc.) °-- The pt. MUST have a primary care doctor that directs their care regularly and follows them in the community °  °[facility]  (866) 331-1348   °[employer]  (888) 892-1162   ° °Agencies that provide inexpensive medical care: °Organization         Address  Phone   Notes  °[facility]  (336) 832-8035   °[facility]    (336) 832-7272   °[facility] [address] °[place], [place] [address] (336) 832-4777   °[facility] [address], °[place] (336) 271-4999   °[facility]    (336) 373-0678   °[facility]    (336) 272-1050   °[facility] ° [address], [place] Phone:  (336) 832-4444, Fax:  (336) 832-4440 Hours of Operation:  9 am - 6 pm, M-F.  Also accepts Medicaid/Medicare and self-pay.  °[facility] ° [address], [place] Phone: (336) 832-3150, Fax: (336) 832-3151. Hours of Operation:  8:30 am - 5:30 pm, M-F.  Also accepts Medicaid and self-pay.  °[facility] [place] [address], [place] Phone: (336) 878-6027   °[facility] [address], [place], [place] (336)723-1848, Ext. 123 Mondays & Thursdays: 7-9 AM.  First 15 patients are seen on a first come, first serve basis. °  ° °Medicaid-accepting [place] Providers: ° °Organization         Address  Phone   Notes  °[facility] [address], [place] (336) 641-2100 Also accepts self-pay patients.  °[facility] [address], [place] ° (336) 856-9996   °[facility] [address], [place]  (336) 288-8857   °[facility] [address] [place] [address], [place] (336) 299-7000   °Veita Bland [address], [place]  ° (336) 373-1557 Only accepts [place] Access Medicaid patients after they have their name applied to their card.  ° °Self-Pay (no insurance) in [place]: ° °Organization         Address  Phone   Notes  °Sickle Cell Patients, [facility] [address], [place] (336) 832-1970   °[facility] [address], [place] (336) 832-4400   °[facility] ° [address], [place] (336) 992-4800   °[facility]/Dr. Osei-Bonsu ° [address] [place] [address], [place] or [address], [place] (336) 841-8500 Phone number for both [place] and [place] locations is the same.  °[facility] [address], [place] (336) 299-0000   °[facility] [address] [place] [address], [place] or [address] (336) 852-7530 °(336) 878-2260   °[facility] [address], [place] (336) 350-1642, phone; (336) 294-5005, fax Sees patients 1st and 3rd Saturday of every month.  Must not qualify for public or private insurance (i.e. Medicaid, Medicare, [place] Health Choice, Veterans' Benefits) • Household income should be no more than 200% of the poverty level •The clinic cannot treat you if you are pregnant or think you are pregnant • Sexually transmitted diseases are not treated at the clinic.  ° ° °Dental Care: °Organization         Address  Phone  Notes  °[place] [facility] [address], [place] (336) 641-6152 Accepts children up to age 21 who are enrolled in Medicaid or [place] Health Choice; pregnant women with a Medicaid card; and children who have applied for Medicaid or [place] Health Choice, but were declined, whose parents can pay a reduced fee at time of service.  °[place]  [facility] [place]  [address], [place] (336) 641-7733 Accepts children up to age 21 who are enrolled in Medicaid or [place] Health Choice; pregnant women with a Medicaid card; and children who have applied for Medicaid or [place] Health Choice, but were declined, whose parents can pay a reduced fee at time of service.  °[facility] ° [address], [place] (336) 641-4533 Patients are seen by appointment only. Walk-ins are not accepted. [facility] will see patients 18 years of age and older. °Monday - Tuesday (8am-5pm) °Most Wednesdays (8:30-5pm) °$30 per visit, cash only  °[facility] ° [address], [place] (336) 641-4533 Patients are seen by appointment only. Walk-ins are not accepted. [facility] will see patients 18 years of age and older. °One   Wednesday Evening (Monthly: Volunteer Based).  $30 per visit, cash only  °[facility]  (919) 537-3737 for adults; Children under age 4, call Graduate Pediatric Dentistry at (919) [phone]. Children aged 4-14, please call (919) [phone] to request a pediatric application. ° Dental services are provided in all areas of dental care including fillings, crowns and bridges, complete and partial dentures, implants, gum treatment, root canals, and extractions. Preventive care is also provided. Treatment is provided to both adults and children. °Patients are selected via a lottery and there is often a waiting list. °  °[facility] [address], °[place] ° (336) 763-8833 [email or web address] °  °[facility] [address], [place], [place] (336)723-1848, Ext. 123 Second and Fourth Thursday of each month, opens at 6:30 AM; Clinic ends at 9 AM.  Patients are seen on a first-come first-served basis, and a limited number are seen during each clinic.  ° °[facility] ° [address], [place], [place] (336) 723-7904    Eligibility Requirements °You must have lived in [place], [place], or [place] counties for at least the last three months. °  You cannot be eligible for state or federal sponsored healthcare insurance, including Veterans Administration, Medicaid, or Medicare. °  You generally cannot be eligible for healthcare insurance through your employer.  °  How to apply: °Eligibility screenings are held every Tuesday and Wednesday afternoon from 1:00 pm until 4:00 pm. You do not need an appointment for the interview!  °[facility] [address], [place], [place] 336-631-2330   °[facility] [facility]  336-342-8273   °[facility]  336-703-3100   ° [facility]  336-570-6415   ° °Behavioral Health Resources in the Community: °Intensive Outpatient Programs °Organization         Address  Phone  Notes  °[facility] [address], [place], [place] 336-878-6098   °[facility] [address], [place], [place] 336-832-9800   °[facility] [address], [place], [place] ° 336-882-2125   °[facility] [address],  °[place], [place] 1-800-853-5163 or 336-641-4981   °Substance Abuse Resources °Organization         Address  Phone  Notes  °[facility]  336-882-2125   °[facility]  336-784-9470   °[facility]  336-285-9073   °[facility]  336-845-3988   °Residential & Outpatient Substance Abuse Program  1-800-659-3381   °Psychological Services °Organization         Address  Phone  Notes  °[facility]  336- 832-9600   °[facility]  336- 378-7881   °[facility] [address], [place] 1-800-853-5163 or 336-641-4981   ° °Mobile Crisis Teams °Organization         Address  Phone  Notes  °[facility], Mobile Crisis Care Unit  1-877-626-1772   °Assertive °[facility] ° [address]  [place], [place] 336-834-9664   °Sharon DeEsch [address] °[place] 336-554-5454   ° °Self-Help/Support Groups °Organization         Address  Phone             Notes  °[facility]  - variety of support groups  336- 373-1402 Call for more information  °Narcotics Anonymous (NA), [facility] [address], °[place]  2 meetings at this location  ° °  Residential Treatment Programs °Organization         Address  Phone  Notes  °[facility] [address],    °[place]  1-866-801-8205   °[facility] ° [address], [place], [place] 704-293-8524   °[facility] [address], [place] 336-845-3988 Admissions: 8am-3pm M-F  °[facility] [address]    °[place], [place] 336-841-1104   °[facility] [address], [place], [place] 336-379-7146   °[facility] [address]  °[place], [place] 336-285-9073   °[facility] - Intensive Outpatient [address], [place], [place] 336-852-3033   °[facility] [address]  °[place], [place] 1-877-615-2722 or 336-784-9470   °[facility] [address] [place], [place] 336-227-7417 Accepts Medicaid  °[facility] [address]  °[place] 1-800-659-3381 Substance Abuse/Addiction Treatment  ° °[place] Behavioral Health Resources °Organization         Address  Phone  Notes  °[facility]  (888) 581-9988   °Julie Brannon, PhD [address] [place], [place]   (336) 349-5553 or (336) 951-0000   °[facility]   [address] °[place], [place] (336) 349-4454   °[facility] [address], [place], [place] (336) 342-8316 Insurance/Medicaid/sponsorship through [facility]  °[facility] [address]                                    [place], [place] (336) 342-8316 Therapy/tele-psych/case    °[facility] [address]  ° [place], [place] (336) 349-2233    °Dr. Arfeen  (336) 349-4544   °[facility] [place]  [employer] [place] [facility] 1) [address], [place] °2) [address], [place] °3)  [address], [place] (336) 349-3220 °(336) 342-7768 ° °(336) 342-8140   °[place] [facility] (336) 342-1394 or (336) 342-3537 (After Hours)    ° ° °

## 2015-07-09 NOTE — ED Provider Notes (Signed)
CSN: [id number]     Arrival date & time 07/09/15  1316 History   First MD Initiated Contact with Patient 07/09/15 1339     Chief Complaint  Patient presents with  . Flank Pain     (Consider location/radiation/quality/duration/timing/severity/associated sxs/prior Treatment) Patient is a 49 y.o. female presenting with back pain.  Back Pain Pain location: right flank. Quality:  Aching, stabbing and shooting Radiates to: right lower abdomen. Pain severity:  Mild Duration:  1 day Chronicity:  Recurrent Relieved by:  None tried Worsened by:  Nothing tried Associated symptoms: dysuria   Associated symptoms: no chest pain     Past Medical History  Diagnosis Date  . Kidney stones per pt.  . Bronchitis    Past Surgical History  Procedure Laterality Date  . Cesarean section    . Hernia repair     History reviewed. No pertinent family history. Social History  Substance Use Topics  . Smoking status: Former Smoker    Quit date: 08/08/2013  . Smokeless tobacco: None  . Alcohol Use: No   OB History    No data available     Review of Systems  Constitutional: Negative for chills.  Eyes: Negative for pain.  Respiratory: Negative for shortness of breath and stridor.   Cardiovascular: Negative for chest pain.  Endocrine: Negative for polydipsia and polyuria.  Genitourinary: Positive for dysuria and hematuria.  Musculoskeletal: Positive for back pain.  Neurological: Negative for dizziness and syncope.  All other systems reviewed and are negative.     Allergies  Review of patient's allergies indicates no known allergies.  Home Medications   Prior to Admission medications   Medication Sig Start Date End Date Taking? Authorizing Provider  ALPRAZolam Duanne Moron) 1 MG tablet Take 1 mg by mouth 2 (two) times daily as needed for anxiety. For pain   Yes Historical Provider, MD  cyclobenzaprine (FLEXERIL) 10 MG tablet Take 1 tablet (10 mg total) by mouth at bedtime. Patient taking  differently: Take 10 mg by mouth daily as needed for muscle spasms.  05/21/15  Yes Ripley Fraise, MD  naproxen sodium (ALEVE) 220 MG tablet Take 220-440 mg by mouth daily as needed (headache).    Yes Historical Provider, MD  Omega-3 Fatty Acids (FISH OIL PO) Take 1 capsule by mouth daily.   Yes Historical Provider, MD  ibuprofen (ADVIL,MOTRIN) 800 MG tablet Take 1 tablet (800 mg total) by mouth 3 (three) times daily. Patient not taking: Reported on 07/09/2015 05/19/15   Ezequiel Essex, MD  ondansetron (ZOFRAN-ODT) 4 MG disintegrating tablet Take 1 tablet (4 mg total) by mouth every 8 (eight) hours as needed for nausea or vomiting. 07/09/15   Merrily Pew, MD  oxyCODONE-acetaminophen (PERCOCET/ROXICET) 5-325 MG per tablet Take 2 tablets by mouth every 6 (six) hours as needed for severe pain. 07/09/15   Merrily Pew, MD  tamsulosin (FLOMAX) 0.4 MG CAPS capsule Take 1 capsule (0.4 mg total) by mouth once. 07/09/15   Merrily Pew, MD   BP 121/56 mmHg  Pulse 66  Temp(Src) 98.9 F (37.2 C) (Oral)  Resp 20  Ht 5\' 5"  (1.651 m)  Wt 226 lb (102.513 kg)  BMI 37.61 kg/m2  SpO2 97%  LMP 02/16/2014 Physical Exam  Constitutional: She is oriented to person, place, and time. She appears well-developed and well-nourished. She appears distressed (with pain, cant sit still).  HENT:  Head: Normocephalic and atraumatic.  Eyes: Conjunctivae and EOM are normal. Right eye exhibits no discharge. Left eye exhibits no discharge.  Cardiovascular: Normal rate and regular rhythm.   Pulmonary/Chest: Effort normal and breath sounds normal. No respiratory distress.  Abdominal: Soft. She exhibits no distension. There is no tenderness. There is no rebound.  Musculoskeletal: Normal range of motion. She exhibits no edema or tenderness.  Neurological: She is alert and oriented to person, place, and time.  Skin: Skin is warm and dry.  Nursing note and vitals reviewed.   ED Course  Procedures (including critical care  time)  Emergency Focused Ultrasound Exam Limited retroperitoneal ultrasound of kidneys  Performed and interpreted by Dr. Dayna Barker Indication: flank pain Focused abdominal ultrasound with both kidneys imaged in transverse and longitudinal planes in real-time. Interpretation: right mild hydronephrosis visualized.   Images archived electronically  CPT Code: [phone] (limited retroperitoneal)    Labs Review Labs Reviewed  URINALYSIS, ROUTINE W REFLEX MICROSCOPIC (NOT AT [facility]) - Abnormal; Notable for the following:    Specific Gravity, Urine >1.030 (*)    Hgb urine dipstick LARGE (*)    Protein, ur TRACE (*)    All other components within normal limits  I-STAT CHEM 8, ED - Abnormal; Notable for the following:    Glucose, Bld 150 (*)    Hemoglobin 15.3 (*)    All other components within normal limits  URINE MICROSCOPIC-ADD ON    Imaging Review No results found. I have personally reviewed and evaluated these images and lab results as part of my medical decision-making.   EKG Interpretation None      MDM   Final diagnoses:  Kidney stone   49 yo F w/ extensive h/o kidney stones here with exact same pain. Slight distress 2/2 pain initially. Hematuria in urine. No e/o AKI. [place] as above with mild hydronephrosis of right kidney. Likely ahs right ureterolithiasis. Treated symptomatically in ED and patient improved. Will FU w/ Urology. Doubt appendicitis at this time. No other evidence of intraabdominal pathology to require imaging.   I have personally and contemperaneously reviewed labs and imaging and used in my decision making as above.   A medical screening exam was performed and I feel the patient has had an appropriate workup for their chief complaint at this time and likelihood of emergent condition existing is low. They have been counseled on decision, discharge, follow up and which symptoms necessitate immediate return to the emergency department. They or their family verbally  stated understanding and agreement with plan and discharged in stable condition.      Merrily Pew, MD 07/09/15 [phone]

## 2015-08-28 ENCOUNTER — Emergency Department (HOSPITAL_COMMUNITY): Payer: Self-pay

## 2015-08-28 ENCOUNTER — Emergency Department (HOSPITAL_COMMUNITY)
Admission: EM | Admit: 2015-08-28 | Discharge: 2015-08-29 | Disposition: A | Discharge status: Home / Self Care | Payer: Self-pay | Attending: Emergency Medicine | Admitting: Emergency Medicine

## 2015-08-28 ENCOUNTER — Encounter (HOSPITAL_COMMUNITY): Payer: Self-pay | Admitting: *Deleted

## 2015-08-28 DIAGNOSIS — N201 Calculus of ureter: Secondary | ICD-10-CM | POA: Insufficient documentation

## 2015-08-28 DIAGNOSIS — Z8709 Personal history of other diseases of the respiratory system: Secondary | ICD-10-CM | POA: Insufficient documentation

## 2015-08-28 DIAGNOSIS — Z87891 Personal history of nicotine dependence: Secondary | ICD-10-CM | POA: Insufficient documentation

## 2015-08-28 DIAGNOSIS — R109 Unspecified abdominal pain: Secondary | ICD-10-CM

## 2015-08-28 DIAGNOSIS — N23 Unspecified renal colic: Secondary | ICD-10-CM | POA: Insufficient documentation

## 2015-08-28 LAB — URINE MICROSCOPIC-ADD ON

## 2015-08-28 LAB — URINALYSIS, ROUTINE W REFLEX MICROSCOPIC
BILIRUBIN URINE: NEGATIVE
GLUCOSE, UA: NEGATIVE mg/dL
Ketones, ur: NEGATIVE mg/dL
Leukocytes, UA: NEGATIVE
Nitrite: NEGATIVE
PH: 5.5 (ref 5.0–8.0)
UROBILINOGEN UA: 0.2 mg/dL (ref 0.0–1.0)

## 2015-08-28 MED ORDER — ONDANSETRON HCL 4 MG/2ML IJ SOLN
4.0000 mg | INTRAMUSCULAR | Status: DC | PRN
  Administered 2015-08-28: 4 mg via INTRAVENOUS
  Filled 2015-08-28: qty 2

## 2015-08-28 MED ORDER — KETOROLAC TROMETHAMINE 30 MG/ML IJ SOLN
30.0000 mg | Freq: Once | INTRAMUSCULAR | Status: AC
  Administered 2015-08-28: 30 mg via INTRAVENOUS
  Filled 2015-08-28: qty 1

## 2015-08-28 MED ORDER — HYDROMORPHONE HCL 1 MG/ML IJ SOLN
1.0000 mg | INTRAMUSCULAR | Status: DC | PRN
  Administered 2015-08-28: 1 mg via INTRAVENOUS
  Filled 2015-08-28: qty 1

## 2015-08-28 MED ORDER — MORPHINE SULFATE (PF) 4 MG/ML IV SOLN
4.0000 mg | INTRAVENOUS | Status: AC | PRN
  Administered 2015-08-28 (×2): 4 mg via INTRAVENOUS
  Filled 2015-08-28 (×2): qty 1

## 2015-08-28 NOTE — ED Notes (Signed)
Patient ambulatory to and from restroom with steady gait.

## 2015-08-28 NOTE — ED Notes (Signed)
Pt c/o right sided flank pain that radiates around to the front of her abdomen. Pt c/o nausea and states she feels like she is about to pass a kidney stone.

## 2015-08-28 NOTE — ED Provider Notes (Signed)
CSN: [id number]     Arrival date & time 08/28/15  2100 History   First MD Initiated Contact with Patient 08/28/15 2112     Chief Complaint  Patient presents with  . Flank Pain     HPI  Pt was seen at 2120.  Per pt, c/o sudden onset and persistence of waxing and waning right sided flank "pain" that began yesterday.  Pt describes the pain as "like my last kidney stone," and radiating into the right side of her abd.  Has been associated with nausea.  Denies dysuria/hematuria, no abd pain, no vomiting/diarrhea, no CP/SOB.   Uro: [facility] Past Medical History  Diagnosis Date  . Kidney stones per pt.  . Bronchitis    Past Surgical History  Procedure Laterality Date  . Cesarean section    . Hernia repair      Social History  Substance Use Topics  . Smoking status: Former Smoker    Quit date: 08/08/2013  . Smokeless tobacco: None  . Alcohol Use: No    Review of Systems ROS: Statement: All systems negative except as marked or noted in the HPI; Constitutional: Negative for fever and chills. ; ; Eyes: Negative for eye pain, redness and discharge. ; ; ENMT: Negative for ear pain, hoarseness, nasal congestion, sinus pressure and sore throat. ; ; Cardiovascular: Negative for chest pain, palpitations, diaphoresis, dyspnea and peripheral edema. ; ; Respiratory: Negative for cough, wheezing and stridor. ; ; Gastrointestinal: +nausea. Negative for vomiting, diarrhea, abdominal pain, blood in stool, hematemesis, jaundice and rectal bleeding. ; ; Genitourinary: Negative for dysuria and hematuria. ; ; Musculoskeletal: +LBP. Negative for neck pain. Negative for swelling and trauma.; ; Skin: Negative for pruritus, rash, abrasions, blisters, bruising and skin lesion.; ; Neuro: Negative for headache, lightheadedness and neck stiffness. Negative for weakness, altered level of consciousness , altered mental status, extremity weakness, paresthesias, involuntary movement, seizure and syncope.      Allergies   Review of patient's allergies indicates no known allergies.  Home Medications   Prior to Admission medications   Medication Sig Start Date End Date Taking? Authorizing Provider  acetaminophen (TYLENOL) 500 MG tablet Take 500 mg by mouth every 6 (six) hours as needed for mild pain or moderate pain.   Yes Historical Provider, MD  ALPRAZolam Duanne Moron) 1 MG tablet Take 1 mg by mouth 2 (two) times daily as needed for anxiety. For pain   Yes Historical Provider, MD  cyclobenzaprine (FLEXERIL) 10 MG tablet Take 1 tablet (10 mg total) by mouth at bedtime. Patient not taking: Reported on 08/28/2015 05/21/15   Ripley Fraise, MD  ibuprofen (ADVIL,MOTRIN) 800 MG tablet Take 1 tablet (800 mg total) by mouth 3 (three) times daily. Patient not taking: Reported on 07/09/2015 05/19/15   Ezequiel Essex, MD  ondansetron (ZOFRAN-ODT) 4 MG disintegrating tablet Take 1 tablet (4 mg total) by mouth every 8 (eight) hours as needed for nausea or vomiting. Patient not taking: Reported on 08/28/2015 07/09/15   Merrily Pew, MD  oxyCODONE-acetaminophen (PERCOCET/ROXICET) 5-325 MG per tablet Take 2 tablets by mouth every 6 (six) hours as needed for severe pain. Patient not taking: Reported on 08/28/2015 07/09/15   Merrily Pew, MD  tamsulosin (FLOMAX) 0.4 MG CAPS capsule Take 1 capsule (0.4 mg total) by mouth once. Patient not taking: Reported on 08/28/2015 07/09/15   Merrily Pew, MD   BP 135/92 mmHg  Pulse 68  Temp(Src) 98.1 F (36.7 C) (Oral)  Resp 14  Ht 5\' 5"  (1.651 m)  Wt 225 lb (102.059 kg)  BMI 37.44 kg/m2  SpO2 99%  LMP 02/16/2014 Physical Exam  2125: Physical examination:  Nursing notes reviewed; Vital signs and O2 SAT reviewed;  Constitutional: Well developed, Well nourished, Well hydrated, Uncomfortable appearing.; Head:  Normocephalic, atraumatic; Eyes: EOMI, PERRL, No scleral icterus; ENMT: Mouth and pharynx normal, Mucous membranes moist; Neck: Supple, Full range of motion, No lymphadenopathy;  Cardiovascular: Regular rate and rhythm, No gallop; Respiratory: Breath sounds clear & equal bilaterally, No rales, rhonchi, wheezes.  Speaking full sentences with ease, Normal respiratory effort/excursion; Chest: Nontender, Movement normal; Abdomen: Soft, Nontender, Nondistended, Normal bowel sounds; Genitourinary: No CVA tenderness; Spine:  No midline CS, TS, LS tenderness. +TTP right lumbar paraspinal muscles.;; Extremities: Pulses normal, No tenderness, No edema, No calf edema or asymmetry.; Neuro: AA&Ox3, Major CN grossly intact.  Speech clear. No gross focal motor or sensory deficits in extremities.; Skin: Color normal, Warm, Dry.    ED Course  Procedures (including critical care time) Labs Review   Imaging Review  I have personally reviewed and evaluated these images and lab results as part of my medical decision-making.   EKG Interpretation None      MDM  MDM Reviewed: previous chart, nursing note and vitals Reviewed previous: CT scan Interpretation: labs and CT scan     Results for orders placed or performed during the hospital encounter of 08/28/15  Urinalysis, Routine w reflex microscopic  Result Value Ref Range   Color, Urine YELLOW YELLOW   APPearance CLEAR CLEAR   Specific Gravity, Urine >1.030 (H) 1.005 - 1.030   pH 5.5 5.0 - 8.0   Glucose, UA NEGATIVE NEGATIVE mg/dL   Hgb urine dipstick LARGE (A) NEGATIVE   Bilirubin Urine NEGATIVE NEGATIVE   Ketones, ur NEGATIVE NEGATIVE mg/dL   Protein, ur TRACE (A) NEGATIVE mg/dL   Urobilinogen, UA 0.2 0.0 - 1.0 mg/dL   Nitrite NEGATIVE NEGATIVE   Leukocytes, UA NEGATIVE NEGATIVE  Urine microscopic-add on  Result Value Ref Range   Squamous Epithelial / LPF FEW (A) RARE   WBC, UA 3-6 <3 WBC/hpf   RBC / HPF 21-50 <3 RBC/hpf   Bacteria, UA FEW (A) RARE   Ct Renal Stone Study 08/28/2015  CLINICAL DATA:  Right-sided flank pain and nausea.  Nephrolithiasis. EXAM: CT ABDOMEN AND PELVIS WITHOUT CONTRAST TECHNIQUE:  Multidetector CT imaging of the abdomen and pelvis was performed following the standard protocol without IV contrast. COMPARISON:  08/30/2014 FINDINGS: Lower chest:  No acute findings. Hepatobiliary: No mass visualized on this un-enhanced exam. Pancreas: No mass or inflammatory process identified on this un-enhanced exam. Spleen: Within normal limits in size. Adrenals/Urinary Tract: 2.6 cm low-attenuation left adrenal mass remains stable, consistent with benign adrenal adenoma. Moderate to severe right hydronephrosis is seen due to a 6 mm calculus at the ureteropelvic junction. This is similar in appearance to previous study although the degree of hydronephrosis is mildly increased. Tiny bilateral nonobstructive intrarenal calculi are again noted. No evidence of distal ureteral calculi or dilatation. Stomach/Bowel: No evidence of obstruction, inflammatory process, or abnormal fluid collections. Vascular/Lymphatic: No pathologically enlarged lymph nodes. No evidence of abdominal aortic aneurysm. Reproductive: No mass or other significant abnormality. Other: None. Musculoskeletal:  No suspicious bone lesions identified. IMPRESSION: Moderate to severe right hydronephrosis due to 6 mm calculus at the ureteropelvic junction. Bilateral nephrolithiasis. Stable benign left adrenal adenoma. Electronically Signed   By: Earle Gell M.D.   On: 08/28/2015 22:39    2355:  Pain improved after meds.  Pt wants to go home now. Pt has been ambulatory with steady gait, easy resps, NAD. Tx symptomatically, f/u Uro. Dx and testing d/w pt and family.  Questions answered.  Verb understanding, agreeable to d/c home with outpt f/u.   Francine Graven, DO 09/01/15 2101

## 2015-08-28 NOTE — ED Notes (Signed)
Patient resting in bed at this time. Eyes closed, even rise and fall of chest. NAD noted. Friend at bedside

## 2015-08-29 MED ORDER — TAMSULOSIN HCL 0.4 MG PO CAPS: 0.4000 mg | ORAL_CAPSULE | Freq: Every day | ORAL | Status: DC

## 2015-08-29 MED ORDER — OXYCODONE-ACETAMINOPHEN 5-325 MG PO TABS: ORAL_TABLET | ORAL | Status: DC

## 2015-08-29 MED ORDER — NAPROXEN 250 MG PO TABS: 250.0000 mg | ORAL_TABLET | Freq: Two times a day (BID) | ORAL | Status: DC | PRN

## 2015-08-29 MED ORDER — ONDANSETRON HCL 4 MG PO TABS: 4.0000 mg | ORAL_TABLET | Freq: Three times a day (TID) | ORAL | Status: DC | PRN

## 2015-08-29 NOTE — Discharge Instructions (Signed)
°Emergency Department Resource Guide °1) Find a Doctor and Pay Out of Pocket °Although you won't have to find out who is covered by your insurance plan, it is a good idea to ask around and get recommendations. You will then need to call the office and see if the doctor you have chosen will accept you as a new patient and what types of options they offer for patients who are self-pay. Some doctors offer discounts or will set up payment plans for their patients who do not have insurance, but you will need to ask so you aren't surprised when you get to your appointment. ° °2) Contact Your Local Health Department °Not all health departments have doctors that can see patients for sick visits, but many do, so it is worth a call to see if yours does. If you don't know where your local health department is, you can check in your phone book. The CDC also has a tool to help you locate your state's health department, and many state websites also have listings of all of their local health departments. ° °3) Find a Walk-in Clinic °If your illness is not likely to be very severe or complicated, you may want to try a walk in clinic. These are popping up all over the country in pharmacies, drugstores, and shopping centers. They're usually staffed by nurse practitioners or physician assistants that have been trained to treat common illnesses and complaints. They're usually fairly quick and inexpensive. However, if you have serious medical issues or chronic medical problems, these are probably not your best option. ° °No Primary Care Doctor: °- Call [facility] at  [phone] - they can help you locate a primary care doctor that  accepts your insurance, provides certain services, etc. °- Physician Referral Service- 1-800-533-3463 ° °Chronic Pain Problems: °Organization         Address  Phone   Notes  °[facility]  ([phone]) [phone] Patients need to be referred by their primary care doctor.  ° °Medication  Assistance: °Organization         Address  Phone   Notes  °[place] [facility] [address] °[place], [place] [address] (336) 641-8030 --Must be a resident of [place] °-- Must have NO insurance coverage whatsoever (no Medicaid/ Medicare, etc.) °-- The pt. MUST have a primary care doctor that directs their care regularly and follows them in the community °  °[facility]  (866) 331-1348   °[employer]  (888) 892-1162   ° °Agencies that provide inexpensive medical care: °Organization         Address  Phone   Notes  °[facility]  (336) 832-8035   °[facility]    (336) 832-7272   °[facility] [address] °[place], [place] [address] (336) 832-4777   °[facility] [address], °[place] (336) 271-4999   °[facility]    (336) 373-0678   °[facility]    (336) 272-1050   °[facility] ° [address], [place] Phone:  (336) 832-4444, Fax:  (336) 832-4440 Hours of Operation:  9 am - 6 pm, M-F.  Also accepts Medicaid/Medicare and self-pay.  °[facility] ° [address], [place] Phone: (336) 832-3150, Fax: (336) [phone]. Hours of Operation:  8:30 am - 5:30 pm, M-F.  Also accepts Medicaid and self-pay.  °[facility] [place] [address], [place] Phone: (336) 878-6027   °[facility] [address], [place], [place] (336)723-1848, Ext. 123 Mondays & Thursdays: 7-9 AM.  First 15 patients are seen on a first come, first serve basis. °  ° °Medicaid-accepting [place] Providers: ° °Organization         Address  Phone   Notes  °[facility] [address], [place] (336) 641-2100 Also accepts self-pay patients.  °[facility] [address], [place] ° (336) 856-9996   °[facility] [address], [place]  (336) 288-8857   °[facility] [address] [place] [address], [place] (336) 299-7000   °Veita Bland [address], [place]  ° (336) 373-1557 Only accepts [place] Access Medicaid patients after they have their name applied to their card.  ° °Self-Pay (no insurance) in [place]: ° °Organization         Address  Phone   Notes  °Sickle Cell Patients, [facility] [address], [place] (336) 832-1970   °[facility] [address], [place] (336) 832-4400   °[facility] [place] ° [address], [place] (336) 992-4800   °[facility]/Dr. Osei-Bonsu ° [address] [place] [address], [place] or [address], [place] (336) [phone] Phone number for both [place] and [place] locations is the same.  °[facility] [address], [place] (336) 299-0000   °[facility] [address] [place] [address], [place] or [address] (336) 852-7530 °(336) 878-2260   °[facility] [address], [place] (336) 350-1642, phone; (336) 294-5005, fax Sees patients 1st and 3rd Saturday of every month.  Must not qualify for public or private insurance (i.e. Medicaid, Medicare, [place] Health Choice, Veterans' Benefits) • Household income should be no more than 200% of the poverty level •The clinic cannot treat you if you are pregnant or think you are pregnant • Sexually transmitted diseases are not treated at the clinic.  ° ° °Dental Care: °Organization         Address  Phone  Notes  °[place] [facility] [address],  (336) 641-6152 Accepts children up to age 21 who are enrolled in Medicaid or [place] Health Choice; pregnant women with a Medicaid card; and children who have applied for Medicaid or [place] Health Choice, but were declined, whose parents can pay a reduced fee at time of service.  °[place]  [facility] [place]  [address], [place] (336) 641-7733 Accepts children up to age 21 who are enrolled in Medicaid or [place] Health Choice; pregnant women with a Medicaid card; and children who have applied for Medicaid or [place] Health Choice, but were declined, whose parents can pay a reduced fee at time of service.  °[facility] ° [address],  (336) 641-4533 Patients are seen by appointment only. Walk-ins are not accepted. [facility] will see patients 18 years of age and older. °Monday - Tuesday (8am-5pm) °Most Wednesdays (8:30-5pm) °$30 per visit, cash only  °[facility] ° [address], [place] (336) 641-4533 Patients are seen by appointment only. Walk-ins are not accepted. [facility] will see patients 18 years of age and older. °One   Wednesday Evening (Monthly: Volunteer Based).  $30 per visit, cash only  °[facility]  (919) 537-3737 for adults; Children under age 4, call Graduate Pediatric Dentistry at ([phone]) [phone]. Children aged 4-14, please call ([phone]) [phone] to request a pediatric application. ° Dental services are provided in all areas of dental care including fillings, crowns and bridges, complete and partial dentures, implants, gum treatment, root canals, and extractions. Preventive care is also provided. Treatment is provided to both adults and children. °Patients are selected via a lottery and there is often a waiting list. °  °[facility] [address], °[place] ° (336) 763-8833 [email or web address] °  °[facility] [address], [place], [place] (336)723-1848, Ext. 123 Second and Fourth Thursday of each month, opens at 6:30 AM; Clinic ends at 9 AM.  Patients are seen on a first-come first-served basis, and a limited number are seen during each clinic.  ° °[facility] ° [address], [place], [place] (336) 723-7904    Eligibility Requirements °You must have lived in [place], [place], or [place] counties for at least the last three months. °  You cannot be eligible for state or federal sponsored healthcare insurance, including Veterans Administration, Medicaid, or Medicare. °  You generally cannot be eligible for healthcare insurance through your employer.  °  How to apply: °Eligibility screenings are held every Tuesday and Wednesday afternoon from 1:00 pm until 4:00 pm. You do not need an appointment for the interview!  °[facility] [address], [place], [place] 336-631-2330   °[facility]  336-342-8273   °[facility]  336-703-3100   °[facility]  336-570-6415   ° °Behavioral Health Resources in the Community: °Intensive Outpatient Programs °Organization         Address  Phone  Notes  °[facility] [address], [place], [place] 336-878-6098   °[facility] [address], [place], [place] 336-832-9800   °[facility] [address], [place], [place] ° 336-882-2125   °[facility] [address],  °[place], [place] 1-800-853-5163 or 336-641-4981   °Substance Abuse Resources °Organization         Address  Phone  Notes  °[facility]  336-882-2125   °[facility]  336-784-9470   °[facility]  336-285-9073   °[facility]  336-845-3988   °Residential & Outpatient Substance Abuse Program  1-800-659-3381   °Psychological Services °Organization         Address  Phone  Notes  °[facility]  336- 832-9600   °[facility]  336- 378-7881   °[facility] [address], [place] 1-800-853-5163 or 336-641-4981   ° °Mobile Crisis Teams °Organization         Address  Phone  Notes  °[facility], Mobile Crisis Care Unit  1-877-626-1772   °Assertive °[facility] ° [address]  [place], [place] 336-834-9664   °Sharon DeEsch [address] °[place] 336-554-5454   ° °Self-Help/Support Groups °Organization         Address  Phone             Notes  °[facility] [place] - variety of support groups  336- 373-1402 Call for more information  °Narcotics Anonymous (NA), [facility] [address], °[place]  2 meetings at this location  ° °  Residential Treatment Programs Organization         Address  Phone  Notes  [facility] [address]    [place]  1-(819)257-3918   [facility]  [address], [place] [address], [place], [place]   [place], [place] (507) 694-8086 Admissions: 8am-3pm M-F  [facility] [place] [address]    [place], [place] 109-323-5573   [facility] [address] [place], [place], [place]   [facility] [address]  [place], [place]   [facility] - Intensive Outpatient [address], [place], [place]   [facility]  [place], [place] 1-(252) 038-7571 or (315)460-3108   [facility] [address] [place] [address] [place], [place] Accepts Medicaid  [facility] [place] [address],  [place] 1-615-753-1875 Substance Abuse/Addiction Treatment   [facility] Organization         Address  Phone  Notes  [facility]  832-578-8414   Domenic Schwab, PhD [address] [place] [place], [place]   216-447-0125 or [phone]   [place], [place] 973-589-5358   [facility] [address], [place], [place] 330-433-1966 Insurance/Medicaid/sponsorship through [facility] and Families [address] [place]                                    [place], [place] 936-043-9400 [place] [address] [place], [place] [phone]    Dr. Adele Schilder  231-100-0341   [facility] Dept. 1) [address] [place] 2) [place] 3)  [place] [address], [place] (431) [phone] (325)[phone]  [phone]   [place] (812) 058-8753 or [phone] (After Hours)      Take the prescriptions as directed. Call the Urologist on Monday to schedule a follow up appointment in the next 4 days.  Return to the Emergency Department immediately if worsening.

## 2015-08-29 NOTE — ED Notes (Signed)
Patient verbalizes understanding of discharge instructions, prescription medications, home care and follow up care. Patient ambulatory out of department at this time with family/friend

## 2015-08-30 LAB — URINE CULTURE: CULTURE: NO GROWTH

## 2015-09-09 ENCOUNTER — Ambulatory Visit: Payer: Self-pay | Admitting: Family Medicine

## 2015-09-16 ENCOUNTER — Other Ambulatory Visit (HOSPITAL_COMMUNITY): Payer: Self-pay | Admitting: Nurse Practitioner

## 2015-09-16 DIAGNOSIS — Z1231 Encounter for screening mammogram for malignant neoplasm of breast: Secondary | ICD-10-CM

## 2015-09-22 ENCOUNTER — Ambulatory Visit (HOSPITAL_COMMUNITY)
Admission: RE | Admit: 2015-09-22 | Discharge: 2015-09-22 | Disposition: A | Discharge status: Home / Self Care | Payer: Self-pay | Source: Ambulatory Visit | Attending: Nurse Practitioner | Admitting: Nurse Practitioner

## 2015-09-22 DIAGNOSIS — Z1231 Encounter for screening mammogram for malignant neoplasm of breast: Secondary | ICD-10-CM

## 2015-11-06 ENCOUNTER — Emergency Department (HOSPITAL_COMMUNITY)
Admission: EM | Admit: 2015-11-06 | Discharge: 2015-11-06 | Disposition: A | Discharge status: Home / Self Care | Payer: Self-pay | Attending: Emergency Medicine | Admitting: Emergency Medicine

## 2015-11-06 ENCOUNTER — Encounter (HOSPITAL_COMMUNITY): Payer: Self-pay | Admitting: *Deleted

## 2015-11-06 DIAGNOSIS — Z87891 Personal history of nicotine dependence: Secondary | ICD-10-CM | POA: Insufficient documentation

## 2015-11-06 DIAGNOSIS — Z8709 Personal history of other diseases of the respiratory system: Secondary | ICD-10-CM | POA: Insufficient documentation

## 2015-11-06 DIAGNOSIS — Z87442 Personal history of urinary calculi: Secondary | ICD-10-CM | POA: Insufficient documentation

## 2015-11-06 DIAGNOSIS — M542 Cervicalgia: Secondary | ICD-10-CM | POA: Insufficient documentation

## 2015-11-06 MED ORDER — NAPROXEN 500 MG PO TABS: 500.0000 mg | ORAL_TABLET | Freq: Two times a day (BID) | ORAL | Status: DC

## 2015-11-06 MED ORDER — CYCLOBENZAPRINE HCL 10 MG PO TABS
10.0000 mg | ORAL_TABLET | Freq: Once | ORAL | Status: AC
  Administered 2015-11-06: 10 mg via ORAL
  Filled 2015-11-06: qty 1

## 2015-11-06 MED ORDER — CYCLOBENZAPRINE HCL 10 MG PO TABS: 10.0000 mg | ORAL_TABLET | Freq: Two times a day (BID) | ORAL | Status: DC | PRN

## 2015-11-06 MED ORDER — NAPROXEN 250 MG PO TABS
500.0000 mg | ORAL_TABLET | Freq: Once | ORAL | Status: AC
  Administered 2015-11-06: 500 mg via ORAL
  Filled 2015-11-06: qty 2

## 2015-11-06 NOTE — Discharge Instructions (Signed)
Heating pad;   Meds for pain and spasm;  Follow up your dr

## 2015-11-06 NOTE — ED Notes (Signed)
Pt states she woke up around 5:30am and her neck felt stiff like she had whip-lash.

## 2015-11-06 NOTE — ED Provider Notes (Signed)
CSN: [id number]     Arrival date & time 11/06/15  [id number] History   First MD Initiated Contact with Patient 11/06/15 [phone]     Chief Complaint  Patient presents with  . Neck Pain     (Consider location/radiation/quality/duration/timing/severity/associated sxs/prior Treatment) HPI..... Left lateral neck pain since 5:30 AM today upon awakening. No radicular symptoms. Patient was normal when she went to bed. No meningeal pain or fever or neurological deficits. Severity is moderate. Positioning makes symptoms  worse  Past Medical History  Diagnosis Date  . Kidney stones per pt.  . Bronchitis    Past Surgical History  Procedure Laterality Date  . Cesarean section    . Hernia repair     No family history on file. Social History  Substance Use Topics  . Smoking status: Former Smoker    Quit date: 08/08/2013  . Smokeless tobacco: None  . Alcohol Use: No   OB History    No data available     Review of Systems  All other systems reviewed and are negative.     Allergies  Review of patient's allergies indicates no known allergies.  Home Medications   Prior to Admission medications   Medication Sig Start Date End Date Taking? Authorizing Provider  acetaminophen (TYLENOL) 500 MG tablet Take 500 mg by mouth every 6 (six) hours as needed for mild pain or moderate pain.    Historical Provider, MD  ALPRAZolam Duanne Moron) 1 MG tablet Take 1 mg by mouth 2 (two) times daily as needed for anxiety. For pain    Historical Provider, MD  cyclobenzaprine (FLEXERIL) 10 MG tablet Take 1 tablet (10 mg total) by mouth 2 (two) times daily as needed for muscle spasms. 11/06/15   Nat Christen, MD  ibuprofen (ADVIL,MOTRIN) 800 MG tablet Take 1 tablet (800 mg total) by mouth 3 (three) times daily. Patient not taking: Reported on 07/09/2015 05/19/15   Ezequiel Essex, MD  naproxen (NAPROSYN) 500 MG tablet Take 1 tablet (500 mg total) by mouth 2 (two) times daily. 11/06/15   Nat Christen, MD  ondansetron (ZOFRAN) 4  MG tablet Take 1 tablet (4 mg total) by mouth every 8 (eight) hours as needed for nausea or vomiting. 08/29/15   Francine Graven, DO  ondansetron (ZOFRAN-ODT) 4 MG disintegrating tablet Take 1 tablet (4 mg total) by mouth every 8 (eight) hours as needed for nausea or vomiting. Patient not taking: Reported on 08/28/2015 07/09/15   Merrily Pew, MD  oxyCODONE-acetaminophen (PERCOCET/ROXICET) 5-325 MG tablet 1 or 2 tabs PO q6h prn pain 08/29/15   Francine Graven, DO  tamsulosin (FLOMAX) 0.4 MG CAPS capsule Take 1 capsule (0.4 mg total) by mouth at bedtime. 08/29/15   Francine Graven, DO   BP 138/74 mmHg  Pulse 75  Temp(Src) 98 F (36.7 C) (Oral)  Resp 20  Ht 5\' 5"  (1.651 m)  Wt 230 lb (104.327 kg)  BMI 38.27 kg/m2  SpO2 98%  LMP 02/16/2014 Physical Exam  Constitutional: She is oriented to person, place, and time. She appears well-developed and well-nourished.  HENT:  Head: Normocephalic and atraumatic.  Eyes: Conjunctivae and EOM are normal. Pupils are equal, round, and reactive to light.  Neck:  Minimal tenderness inferior lateral left neck.  No posterior cervical tenderness  Musculoskeletal: Normal range of motion.  Neurological: She is alert and oriented to person, place, and time.  Skin: Skin is warm and dry.  Psychiatric: She has a normal mood and affect. Her behavior is normal.  Nursing note  and vitals reviewed.   ED Course  Procedures (including critical care time) Labs Review Labs Reviewed - No data to display  Imaging Review No results found. I have personally reviewed and evaluated these images and lab results as part of my medical decision-making.   EKG Interpretation None      MDM   Final diagnoses:  Neck pain    No evidence of meningitis or vascular involvement. Pain appears to be musculoskeletal. Rx Naprosyn 500 mg and Flexeril 10 mg    Nat Christen, MD 11/06/15 [phone]

## 2015-11-13 ENCOUNTER — Emergency Department (HOSPITAL_COMMUNITY)
Admission: EM | Admit: 2015-11-13 | Discharge: 2015-11-13 | Disposition: A | Discharge status: Home / Self Care | Payer: Self-pay | Attending: Emergency Medicine | Admitting: Emergency Medicine

## 2015-11-13 ENCOUNTER — Encounter (HOSPITAL_COMMUNITY): Payer: Self-pay | Admitting: Emergency Medicine

## 2015-11-13 ENCOUNTER — Emergency Department (HOSPITAL_COMMUNITY): Payer: Self-pay

## 2015-11-13 DIAGNOSIS — Z9889 Other specified postprocedural states: Secondary | ICD-10-CM | POA: Insufficient documentation

## 2015-11-13 DIAGNOSIS — N201 Calculus of ureter: Secondary | ICD-10-CM | POA: Insufficient documentation

## 2015-11-13 DIAGNOSIS — Z8709 Personal history of other diseases of the respiratory system: Secondary | ICD-10-CM | POA: Insufficient documentation

## 2015-11-13 DIAGNOSIS — Z87891 Personal history of nicotine dependence: Secondary | ICD-10-CM | POA: Insufficient documentation

## 2015-11-13 LAB — CBC WITH DIFFERENTIAL/PLATELET
BASOS ABS: 0 10*3/uL (ref 0.0–0.1)
BASOS PCT: 0 %
Eosinophils Absolute: 0.1 10*3/uL (ref 0.0–0.7)
Eosinophils Relative: 1 %
HEMATOCRIT: 41 % (ref 36.0–46.0)
HEMOGLOBIN: 13.9 g/dL (ref 12.0–15.0)
LYMPHS PCT: 26 %
Lymphs Abs: 2.1 10*3/uL (ref 0.7–4.0)
MCH: 31.7 pg (ref 26.0–34.0)
MCHC: 33.9 g/dL (ref 30.0–36.0)
MCV: 93.6 fL (ref 78.0–100.0)
MONO ABS: 0.6 10*3/uL (ref 0.1–1.0)
MONOS PCT: 8 %
NEUTROS ABS: 5.4 10*3/uL (ref 1.7–7.7)
NEUTROS PCT: 65 %
Platelets: 297 10*3/uL (ref 150–400)
RBC: 4.38 MIL/uL (ref 3.87–5.11)
RDW: 12.1 % (ref 11.5–15.5)
WBC: 8.2 10*3/uL (ref 4.0–10.5)

## 2015-11-13 LAB — BASIC METABOLIC PANEL
ANION GAP: 6 (ref 5–15)
BUN: 11 mg/dL (ref 6–20)
CALCIUM: 9.7 mg/dL (ref 8.9–10.3)
CHLORIDE: 107 mmol/L (ref 101–111)
CO2: 26 mmol/L (ref 22–32)
Creatinine, Ser: 0.6 mg/dL (ref 0.44–1.00)
GFR calc non Af Amer: >60 mL/min (ref >60)
GLUCOSE: 118 mg/dL — AB (ref 65–99)
Potassium: 4 mmol/L (ref 3.5–5.1)
Sodium: 139 mmol/L (ref 135–145)

## 2015-11-13 LAB — URINE MICROSCOPIC-ADD ON
BACTERIA UA: NONE SEEN
WBC UA: NONE SEEN WBC/hpf (ref 0–5)

## 2015-11-13 LAB — URINALYSIS, ROUTINE W REFLEX MICROSCOPIC
Bilirubin Urine: NEGATIVE
GLUCOSE, UA: NEGATIVE mg/dL
Ketones, ur: NEGATIVE mg/dL
LEUKOCYTES UA: NEGATIVE
NITRITE: NEGATIVE
PH: 5.5 (ref 5.0–8.0)
SPECIFIC GRAVITY, URINE: 1.025 (ref 1.005–1.030)

## 2015-11-13 MED ORDER — OXYCODONE-ACETAMINOPHEN 5-325 MG PO TABS: ORAL_TABLET | ORAL | Status: DC

## 2015-11-13 MED ORDER — MORPHINE SULFATE (PF) 4 MG/ML IV SOLN
4.0000 mg | Freq: Once | INTRAVENOUS | Status: AC
  Administered 2015-11-13: 4 mg via INTRAVENOUS
  Filled 2015-11-13: qty 1

## 2015-11-13 MED ORDER — ONDANSETRON HCL 8 MG PO TABS: 8.0000 mg | ORAL_TABLET | Freq: Three times a day (TID) | ORAL | Status: DC | PRN

## 2015-11-13 MED ORDER — HYDROMORPHONE HCL 1 MG/ML IJ SOLN
1.0000 mg | Freq: Once | INTRAMUSCULAR | Status: AC
  Administered 2015-11-13: 1 mg via INTRAVENOUS
  Filled 2015-11-13: qty 1

## 2015-11-13 MED ORDER — ONDANSETRON HCL 4 MG/2ML IJ SOLN
4.0000 mg | Freq: Once | INTRAMUSCULAR | Status: AC
  Administered 2015-11-13: 4 mg via INTRAVENOUS
  Filled 2015-11-13: qty 2

## 2015-11-13 MED ORDER — TAMSULOSIN HCL 0.4 MG PO CAPS: 0.4000 mg | ORAL_CAPSULE | Freq: Every day | ORAL | Status: DC

## 2015-11-13 MED ORDER — KETOROLAC TROMETHAMINE 30 MG/ML IJ SOLN
30.0000 mg | Freq: Once | INTRAMUSCULAR | Status: AC
  Administered 2015-11-13: 30 mg via INTRAVENOUS
  Filled 2015-11-13: qty 1

## 2015-11-13 NOTE — ED Provider Notes (Signed)
CSN: [id number]     Arrival date & time 11/13/15  1244 History   First MD Initiated Contact with Patient 11/13/15 1254     Chief Complaint  Patient presents with  . Flank Pain     (Consider location/radiation/quality/duration/timing/severity/associated sxs/prior Treatment) Patient is a 49 y.o. female presenting with flank pain. The history is provided by the patient.  Flank Pain Pertinent negatives include no chest pain, no abdominal pain, no headaches and no shortness of breath.   patient presents with right flank pain. Had a for last couple days. History of multiple kidney stones and states this feels the same. States she had a little dysuria with some blood earlier today. Had some nausea. No frank vomiting. No diarrhea or constipation. No fevers or chills. Sharp and in the right flank. States it feels as if it is in the groin also.  Past Medical History  Diagnosis Date  . Kidney stones per pt.  . Bronchitis    Past Surgical History  Procedure Laterality Date  . Cesarean section    . Hernia repair     History reviewed. No pertinent family history. Social History  Substance Use Topics  . Smoking status: Former Smoker    Quit date: 08/08/2013  . Smokeless tobacco: None  . Alcohol Use: No   OB History    No data available     Review of Systems  Constitutional: Negative for activity change and appetite change.  Eyes: Negative for pain.  Respiratory: Negative for chest tightness and shortness of breath.   Cardiovascular: Negative for chest pain and leg swelling.  Gastrointestinal: Positive for nausea. Negative for vomiting, abdominal pain and diarrhea.  Genitourinary: Positive for dysuria, hematuria and flank pain.  Musculoskeletal: Negative for back pain and neck stiffness.  Skin: Negative for rash.  Neurological: Negative for weakness, numbness and headaches.  Psychiatric/Behavioral: Negative for behavioral problems.      Allergies  Review of patient's allergies  indicates no known allergies.  Home Medications   Prior to Admission medications   Medication Sig Start Date End Date Taking? Authorizing Provider  acetaminophen (TYLENOL) 500 MG tablet Take 500 mg by mouth every 6 (six) hours as needed for mild pain or moderate pain.   Yes Historical Provider, MD  ALPRAZolam Duanne Moron) 1 MG tablet Take 1 mg by mouth 2 (two) times daily as needed for anxiety. For pain   Yes Historical Provider, MD  cyclobenzaprine (FLEXERIL) 10 MG tablet Take 1 tablet (10 mg total) by mouth 2 (two) times daily as needed for muscle spasms. Patient not taking: Reported on 11/13/2015 11/06/15   Nat Christen, MD  naproxen (NAPROSYN) 500 MG tablet Take 1 tablet (500 mg total) by mouth 2 (two) times daily. Patient not taking: Reported on 11/13/2015 11/06/15   Nat Christen, MD  ondansetron (ZOFRAN) 4 MG tablet Take 1 tablet (4 mg total) by mouth every 8 (eight) hours as needed for nausea or vomiting. Patient not taking: Reported on 11/13/2015 08/29/15   Francine Graven, DO  ondansetron (ZOFRAN-ODT) 4 MG disintegrating tablet Take 1 tablet (4 mg total) by mouth every 8 (eight) hours as needed for nausea or vomiting. Patient not taking: Reported on 08/28/2015 07/09/15   Merrily Pew, MD  oxyCODONE-acetaminophen (PERCOCET/ROXICET) 5-325 MG tablet 1 or 2 tabs PO q6h prn pain 11/13/15   Davonna Belling, MD  tamsulosin (FLOMAX) 0.4 MG CAPS capsule Take 1 capsule (0.4 mg total) by mouth at bedtime. 11/13/15   Davonna Belling, MD   BP [id number]  mmHg  Pulse 57  Temp(Src) 98 F (36.7 C) (Oral)  Resp 18  Ht 5\' 5"  (1.651 m)  Wt 234 lb (106.142 kg)  BMI 38.94 kg/m2  SpO2 91%  LMP 02/16/2014 Physical Exam  Constitutional: She appears well-developed.  Patient appears uncomfortable  HENT:  Head: Atraumatic.  Neck: Neck supple.  Cardiovascular: Normal rate.   Pulmonary/Chest: Effort normal.  Abdominal: Soft.  Mild right lower quadrant tenderness without rebound or guarding.  Genitourinary:   CVA tenderness on right  Neurological: She is alert.  Skin: Skin is warm.    ED Course  Procedures (including critical care time) Labs Review Labs Reviewed  BASIC METABOLIC PANEL - Abnormal; Notable for the following:    Glucose, Bld 118 (*)    All other components within normal limits  URINALYSIS, ROUTINE W REFLEX MICROSCOPIC (NOT AT [facility]) - Abnormal; Notable for the following:    Hgb urine dipstick LARGE (*)    Protein, ur TRACE (*)    All other components within normal limits  URINE MICROSCOPIC-ADD ON - Abnormal; Notable for the following:    Squamous Epithelial / LPF 0-5 (*)    All other components within normal limits  CBC WITH DIFFERENTIAL/PLATELET    Imaging Review Dg Abd 1 View  11/13/2015  CLINICAL DATA:  Abdominal pain. Right-sided. History kidney stones. EXAM: ABDOMEN - 1 VIEW COMPARISON:  CT of 08/28/2015 FINDINGS: Non-obstructive bowel gas pattern. Lower pole right renal collecting system stone measures approximately 5 mm. A punctate upper pole left renal collecting system stone on the order of 4 mm. A 6 mm calcification, likely representing a proximal ureteric stone, projects adjacent to the right transverse process of L2. This is slightly more cephalad than the 6 mm ureteric stone on the 08/28/2015 CT. Probable phleboliths in the left hemipelvis. IMPRESSION: Bilateral nephrolithiasis. A 6 mm presumed right ureteric calculus could be new since the prior CT or have migrated minimally superiorly. Electronically Signed   By: Abigail Miyamoto M.D.   On: 11/13/2015 14:10   I have personally reviewed and evaluated these images and lab results as part of my medical decision-making.   EKG Interpretation None      MDM   Final diagnoses:  Ureteral stone    Patient with right flank pain. History kidney stone. Had 6 mm stone that either migrated proximally or had another stone that started to come down. Lab work reassuring. Morphine has helped somewhat but will add  Toradol.Care turned over to Dr Eulis Foster.     Davonna Belling, MD 11/13/15 1536

## 2015-11-13 NOTE — ED Provider Notes (Signed)
Patient's pain is improved. She is comfortable going home at this time. She requests Zofran to use for nausea when taking pain medication.  Daleen Bo, MD 11/13/15 [phone]

## 2015-11-13 NOTE — ED Notes (Signed)
Pt states that she started having right flank pain last night.  Hx of kidney stones.

## 2015-11-13 NOTE — Discharge Instructions (Signed)
Kidney Stones °Kidney stones (urolithiasis) are deposits that form inside your kidneys. The intense pain is caused by the stone moving through the urinary tract. When the stone moves, the ureter goes into spasm around the stone. The stone is usually passed in the urine.  °CAUSES  °· A disorder that makes certain neck glands produce too much parathyroid hormone (primary hyperparathyroidism). °· A buildup of uric acid crystals, similar to gout in your joints. °· Narrowing (stricture) of the ureter. °· A kidney obstruction present at birth (congenital obstruction). °· Previous surgery on the kidney or ureters. °· Numerous kidney infections. °SYMPTOMS  °· Feeling sick to your stomach (nauseous). °· Throwing up (vomiting). °· Blood in the urine (hematuria). °· Pain that usually spreads (radiates) to the groin. °· Frequency or urgency of urination. °DIAGNOSIS  °· Taking a history and physical exam. °· Blood or urine tests. °· CT scan. °· Occasionally, an examination of the inside of the urinary bladder (cystoscopy) is performed. °TREATMENT  °· Observation. °· Increasing your fluid intake. °· Extracorporeal shock wave lithotripsy--This is a noninvasive procedure that uses shock waves to break up kidney stones. °· Surgery may be needed if you have severe pain or persistent obstruction. There are various surgical procedures. Most of the procedures are performed with the use of small instruments. Only small incisions are needed to accommodate these instruments, so recovery time is minimized. °The size, location, and chemical composition are all important variables that will determine the proper choice of action for you. Talk to your health care provider to better understand your situation so that you will minimize the risk of injury to yourself and your kidney.  °HOME CARE INSTRUCTIONS  °· Drink enough water and fluids to keep your urine clear or pale yellow. This will help you to pass the stone or stone fragments. °· Strain  all urine through the provided strainer. Keep all particulate matter and stones for your health care provider to see. The stone causing the pain may be as small as a grain of salt. It is very important to use the strainer each and every time you pass your urine. The collection of your stone will allow your health care provider to analyze it and verify that a stone has actually passed. The stone analysis will often identify what you can do to reduce the incidence of recurrences. °· Only take over-the-counter or prescription medicines for pain, discomfort, or fever as directed by your health care provider. °· Keep all follow-up visits as told by your health care provider. This is important. °· Get follow-up X-rays if required. The absence of pain does not always mean that the stone has passed. It may have only stopped moving. If the urine remains completely obstructed, it can cause loss of kidney function or even complete destruction of the kidney. It is your responsibility to make sure X-rays and follow-ups are completed. Ultrasounds of the kidney can show blockages and the status of the kidney. Ultrasounds are not associated with any radiation and can be performed easily in a matter of minutes. °· Make changes to your daily diet as told by your health care provider. You may be told to: °¨ Limit the amount of salt that you eat. °¨ Eat 5 or more servings of fruits and vegetables each day. °¨ Limit the amount of meat, poultry, fish, and eggs that you eat. °· Collect a 24-hour urine sample as told by your health care provider. You may need to collect another urine sample every 6-12   months. °SEEK MEDICAL CARE IF: °· You experience pain that is progressive and unresponsive to any pain medicine you have been prescribed. °SEEK IMMEDIATE MEDICAL CARE IF:  °· Pain cannot be controlled with the prescribed medicine. °· You have a fever or shaking chills. °· The severity or intensity of pain increases over 18 hours and is not  relieved by pain medicine. °· You develop a new onset of abdominal pain. °· You feel faint or pass out. °· You are unable to urinate. °  °This information is not intended to replace advice given to you by your health care provider. Make sure you discuss any questions you have with your health care provider. °  °Document Released: 10/30/2005 Document Revised: 07/21/2015 Document Reviewed: 04/02/2013 °Elsevier Interactive Patient Education ©2016 [employer] ° °

## 2015-11-17 ENCOUNTER — Ambulatory Visit (INDEPENDENT_AMBULATORY_CARE_PROVIDER_SITE_OTHER): Payer: Self-pay | Admitting: Sports Medicine

## 2015-11-17 ENCOUNTER — Encounter: Payer: Self-pay | Admitting: Sports Medicine

## 2015-11-17 VITALS — BP 130/80 | Ht 65.0 in | Wt 232.0 lb

## 2015-11-17 DIAGNOSIS — M542 Cervicalgia: Secondary | ICD-10-CM

## 2015-11-17 DIAGNOSIS — M545 Low back pain, unspecified: Secondary | ICD-10-CM

## 2015-11-17 HISTORY — DX: Cervicalgia: M54.2

## 2015-11-17 HISTORY — DX: Low back pain, unspecified: M54.50

## 2015-11-17 NOTE — Assessment & Plan Note (Signed)
Michaela Aguilar is a very pleasant 50 year old female with no known comorbidities other than morbidly obesity presents with left low back pain since a fall and a commercial store 5 months ago.she had multiple imaging studies including CT and MRI which does not show any abnormalities at that time.  Her pain has been refractory to muscle relaxants and over-the-counter pain medications.  She has agreed that physical therapy would be the best course of action.  She has no radiculopathy or other red flag signs or symptoms.  We'll see her back in a month to assess how physical therapy is going.

## 2015-11-17 NOTE — Progress Notes (Signed)
  Michaela Aguilar - 50 y.o. female MRN [id number]  Date of birth: 03/06/1966  CC: Neck and low back pain.   SUBJECTIVE:   HPI   [place] on 05/19/15 in store.  Hit head.   - Pain in left lateral neck as well as left lower back.  - Cervical MRI & CT showing no acute changes or spondylolisthesis. - Naprosyn 500 & flexeril 10mg  Rx'd, but neither of those help.   - Various location on left upper back and left cervical paraspinal muscles. - pain radiates from left cervical spine to her acromion. - she tries to do daily range of motion, but she is limited by pain - Hot shower makes it feel better temporarily. Heating pad also helps.  - No PT due to lack of insurance.  - denies radiating pain down her upper extremities or either leg. - she also has pain in her left lumbar paraspinal muscles and medial left gluteal.  This has all been going on since her fall.  - Denies any bowel or bladder dysfunction. - Denies fevers chills or night sweats.   ROS:     10 point RoS negative other than that listed in HPI above.   HISTORY: Past Medical, Surgical, Social, and Family History Reviewed & Updated per EMR.  Do PCP or daily medications.   OBJECTIVE: LMP 02/16/2014  Physical Exam BP 130/80 Calm, NAD Non-labored breathing  Neck: Negative spurling's Full neck range of motion although pain with various movements. Grip strength and sensation normal in bilateral hands 4-5 strength on the left although this is inconsistent  No sensory change to C4 to T1 Reflexes normal  Back Exam: Inspection:no abnormality. Motion:full SLR seated:with                         Palpable tenderness:tenderness throughout the lumbar spineand left gluteal..  No consistent location. Sensory change:none Reflex change:none  Strength at foot Plantar-flexion:  5/5    Dorsi-flexion: 5 / 5    Eversion:  5/ 5   Inversion:  5/ 5 Leg strength Quad:  5/ 5   Hamstring:  5/ 5   Hip flexor:  5/ 5   Hip abductors:  5/ 5  MEDICATIONS,  LABS & OTHER ORDERS: Previous Medications   ACETAMINOPHEN (TYLENOL) 500 MG TABLET    Take 500 mg by mouth every 6 (six) hours as needed for mild pain or moderate pain.   ALPRAZOLAM (XANAX) 1 MG TABLET    Take 1 mg by mouth 2 (two) times daily as needed for anxiety. For pain   CYCLOBENZAPRINE (FLEXERIL) 10 MG TABLET    Take 1 tablet (10 mg total) by mouth 2 (two) times daily as needed for muscle spasms.   NAPROXEN (NAPROSYN) 500 MG TABLET    Take 1 tablet (500 mg total) by mouth 2 (two) times daily.   OXYCODONE-ACETAMINOPHEN (PERCOCET/ROXICET) 5-325 MG TABLET    1 or 2 tabs PO q6h prn pain   TAMSULOSIN (FLOMAX) 0.4 MG CAPS CAPSULE    Take 1 capsule (0.4 mg total) by mouth at bedtime.   Modified Medications   No medications on file   New Prescriptions   No medications on file   Discontinued Medications   No medications on file  No orders of the defined types were placed in this encounter.   ASSESSMENT & PLAN: See problem based charting & AVS for pt instructions.

## 2015-11-17 NOTE — Assessment & Plan Note (Signed)
Patient is a very pleasant 50 year old female without any known comorbidities presents about 5 months after falling backwards at a store.  She has had neck and low back pain since this time. No radiculopathy or other neurologic finding on today's exam.  She is tight throughout her left trap and left cervical paraspinal muscles.  She tries to do occasional range of motion exercises but has been unable to.  Over-the-counter pain medications as well as Flexeril has not provided her much relief.  We will get her into physical therapy.  We'll see her back in a month if this is not helpful.  She had multiple imaging studies in the ER physician never had a cervical x-ray which may be beneficial.  She can call with any questions in the interim.

## 2015-11-22 ENCOUNTER — Ambulatory Visit: Payer: Self-pay | Admitting: Physical Therapy

## 2015-11-29 ENCOUNTER — Encounter: Payer: Self-pay | Admitting: Rehabilitative and Restorative Service Providers"

## 2015-11-29 ENCOUNTER — Ambulatory Visit (INDEPENDENT_AMBULATORY_CARE_PROVIDER_SITE_OTHER): Payer: Self-pay | Admitting: Rehabilitative and Restorative Service Providers"

## 2015-11-29 DIAGNOSIS — R293 Abnormal posture: Secondary | ICD-10-CM

## 2015-11-29 DIAGNOSIS — Z7409 Other reduced mobility: Secondary | ICD-10-CM

## 2015-11-29 DIAGNOSIS — R6889 Other general symptoms and signs: Secondary | ICD-10-CM

## 2015-11-29 DIAGNOSIS — R531 Weakness: Secondary | ICD-10-CM

## 2015-11-29 DIAGNOSIS — R29898 Other symptoms and signs involving the musculoskeletal system: Secondary | ICD-10-CM

## 2015-11-29 DIAGNOSIS — M623 Immobility syndrome (paraplegic): Secondary | ICD-10-CM

## 2015-11-29 DIAGNOSIS — M2560 Stiffness of unspecified joint, not elsewhere classified: Secondary | ICD-10-CM

## 2015-11-29 NOTE — Therapy (Signed)
[place], [place], [address] Phone: [phone]   Fax:  [phone]  Physical Therapy Evaluation  Patient Details  Name: Michaela Aguilar MRN: [id number] Date of Birth: 11-09-1966 Referring Provider: Dr. Lilia Argue   Encounter Date: 11/29/2015      PT End of Session - 11/29/15 1338    Visit Number 1   Number of Visits 12   Date for PT Re-Evaluation 01/10/16   PT Start Time [id number]   PT Stop Time 1112   PT Time Calculation (min) 57 min   Activity Tolerance Patient limited by pain;Patient tolerated treatment well      Past Medical History  Diagnosis Date  . Kidney stones per pt.  . Bronchitis     Past Surgical History  Procedure Laterality Date  . Cesarean section    . Hernia repair      There were no vitals filed for this visit.  Visit Diagnosis:  Abnormal posture - Plan: PT plan of care cert/re-cert  Weakness of both lower extremities - Plan: PT plan of care cert/re-cert  Stiffness due to immobility - Plan: PT plan of care cert/re-cert  Decreased strength, endurance, and mobility - Plan: PT plan of care cert/re-cert      Subjective Assessment - 11/29/15 1027    Subjective Patient reports that she is having pain in the Lt side. Symptoms started 05/19/15 when she slipped in the store striking her head and loosing consciousness. She was seen in the ED and released. She began to have pain the day of the fall and has continued to have pain in the Lt side.    Pertinent History kidney stones; denies any other musculoskeletal problems; anxiety    How long can you sit comfortably? 5-10 min    How long can you stand comfortably? 2 min    How long can you walk comfortably? 2 min    Diagnostic tests xrays; CT scan    Patient Stated Goals get rid of some of the pain    Currently in Pain? Yes   Pain Score 7    Pain Location Buttocks   Pain Orientation Left;Posterior   Pain Descriptors /  Indicators Aching;Nagging   Pain Radiating Towards into Lt anterior thigh down to foot; has a "charlie horse in the front of the leg9shin)    Pain Onset More than a month ago   Pain Frequency Constant   Aggravating Factors  siting > 5 min; standing/walking >2 min; moving around   Pain Relieving Factors meds; heat; injection in and hospital             [facility] PT Assessment - 11/29/15 0001    Assessment   Medical Diagnosis Lt LB and LE pain    Referring Provider Dr. Lilia Argue    Onset Date/Surgical Date 05/19/15   Hand Dominance Right   Next MD Visit 2/17   Prior Therapy none    Precautions   Precautions None   Balance Screen   Has the patient fallen in the past 6 months Yes   How many times? 1   Has the patient had a decrease in activity level because of a fear of falling?  Yes   Is the patient reluctant to leave their home because of a fear of falling?  No   Home Environment   Additional Comments single level home 15 steps to enter railing on Lt ascending - difficult due to pain  Prior Function   Level of Independence Independent   Vocation Other (comment);Unemployed   Vocation Requirements unemployed since 2010 - in school for 2 years - not working or in school when she fell    Leisure household chores; sedentary life style    Observation/Other Assessments   Focus on Therapeutic Outcomes (FOTO)  65% limitation   Sensation   Additional Comments pins and needles in the whole foot - feels "asleep" comes and goes    Posture/Postural Control   Posture Comments head forward; shoulders rounded and elevated; incresaed thoracic kyphosis; weight shifted to Rt; trunk shifted to the Rt    AROM   Overall AROM Comments pain with all trunk and cervical motions    AROM Assessment Site --  bilat hip ROM WFL's    Cervical Flexion 31   Cervical Extension 24   Cervical - Right Side Bend 38   Cervical - Left Side Bend 25   Cervical - Right Rotation 60   Cervical - Left Rotation 24    Lumbar Flexion 40%  trunk deviated to Rt    Lumbar Extension 0%   Lumbar - Right Side Bend 50%   Lumbar - Left Side Bend 30%   Lumbar - Right Rotation 30%    Lumbar - Left Rotation 10%   Strength   Overall Strength Comments LE strength asses - patient unable to give maximum effort for testing due to pain in LT LE - muscle strength testing less accurate    Right/Left Hip --  Rt 4+/5; Lt 3-/5 to 3/5    Right/Left Knee --  Rt 5/5; Lt 5-/5   Right/Left Ankle --  Rt 5/5; Lt 5-/5    Flexibility   Hamstrings Rt 82 deg; Lt 78 deg   Quadriceps tight bilat Rt 102 deg; Lt 95   ITB tight Lt > Rt   Piriformis tight Lt    Palpation   Spinal mobility pain with CPA/UPA Lt > Rt entire lumbar spine    SI assessment  WFL   Palpation comment tightness/pain through the Lt QL/lats/lumbar paraspinals; Lt hip abductors     Bed Mobility   Bed Mobility --  difficutly with position changes    Ambulation/Gait   Gait Comments antalgic gait limp on Lt LE with Lt LE ER/toe out                    [facility] Adult PT Treatment/Exercise - 11/29/15 0001    Lumbar Exercises: Supine   AB Set Limitations 3 part core 10 sec hold x 10    Other Supine Lumbar Exercises gentle trunk rotation supine 10    Other Supine Lumbar Exercises alternate shoulder flexion 10 each    Moist Heat Therapy   Number Minutes Moist Heat 15 Minutes   Moist Heat Location Hip;Lumbar Spine   Electrical Stimulation   Electrical Stimulation Location Lt hip/lumbar spine    Electrical Stimulation Action IFC   Electrical Stimulation Parameters to tolerance    Electrical Stimulation Goals Pain                PT Education - 11/29/15 1058    Education provided Yes   Education Details importance of moving; HEP   Person(s) Educated Patient   Methods Explanation;Demonstration;Tactile cues;Verbal cues;Handout   Comprehension Verbalized understanding;Returned demonstration;Verbal cues required;Tactile cues required              PT Long Term Goals - 11/29/15 1343    PT LONG TERM  GOAL #1   Title Improve posture and alignment in standing and with gait 01/10/16   Time 2   Period Weeks   Status New   PT LONG TERM GOAL #2   Title Increase strength bilat LE's by 1/2 to 1 mm grade 01/10/16   Time 6   Period Weeks   Status New   PT LONG TERM GOAL #3   Title Improve standing and walking tolerance to 15-20 min 01/10/16   Time 6   Period Weeks   Status New   PT LONG TERM GOAL #4   Title I in HEP 01/10/16   Time 6   Period Weeks   Status New   PT LONG TERM GOAL #5   Title Improve FOTO to </= 44% limitation 01/10/16   Time 6   Period Weeks   Status New               Plan - 11/29/15 1339    Clinical Impression Statement Patient presents with chronic LBP/Lt lower quadrant pain/Lt UE/neck pain following a fall 05-19-15 in shich she fell back striking her head on the floor(with LOC). Patient has abnormal posture; antalgic gait; limited trunk and LE mobility; decreased LE strength; limited functioinal activity level. She will benefit form PT to address problems identified.    Pt will benefit from skilled therapeutic intervention in order to improve on the following deficits Postural dysfunction;Improper body mechanics;Pain;Decreased range of motion;Decreased strength;Increased fascial restricitons;Decreased endurance;Decreased activity tolerance;Abnormal gait   Rehab Potential Good   PT Frequency 2x / week   PT Duration 6 weeks   PT Treatment/Interventions Patient/family education;ADLs/Self Care Home Management;Therapeutic exercise;Therapeutic activities;Neuromuscular re-education;Balance training;Manual techniques;Dry needling;Cryotherapy;Electrical Stimulation;Moist Heat;Ultrasound   PT Next Visit Plan progress with ROM and strengthening; gait training as indicated; modalities and manual work as indicated; TENS unit for home    PT Home Exercise Plan HEP; encouranged use of friend's TENS unit for pain  management   Consulted and Agree with Plan of Care Patient         Problem List Patient Active Problem List   Diagnosis Date Noted  . Neck pain 11/17/2015  . Low back pain 11/17/2015    Clarke Peretz Nilda Simmer PT, MPH  11/29/2015, 1:50 PM  [facility] [place], [place], [address] Phone: [phone]   Fax:  [phone]  Name: Michaela Aguilar MRN: [id number] Date of Birth: 1966/03/19

## 2015-11-29 NOTE — Patient Instructions (Addendum)
Abdominal Bracing With Pelvic Floor (Hook-Lying)    With neutral spine, tighten pelvic floor and abdominals sucking belly button to back bone; tighten muscles in back at waist. Hold 10 sec Repeat _10 __ times. Do _several__ times a day.  Knee Roll    Lying on back, with knees bent and feet flat on floor, arms outstretched to sides, slowly roll both knees to side, hold 5 seconds. Back to starting position, hold 5 seconds. Then to opposite side, hold 5 seconds. Return to starting position. Keep shoulders and arms in contact with floor. 3-4 times during the day    Lying on back raise one arm up over head and back down by side Repeat with opposite arm 10 reps on each arm  3-4 times during the day    TENS UNIT: This is helpful for muscle pain and spasm.   Search and Purchase a TENS 7000 2nd edition at www.tenspros.com. It should be less than $30.     TENS unit instructions: Do not shower or bathe with the unit on Turn the unit off before removing electrodes or batteries If the electrodes lose stickiness add a drop of water to the electrodes after they are disconnected from the unit and place on plastic sheet. If you continued to have difficulty, call the TENS unit company to purchase more electrodes. Do not apply lotion on the skin area prior to use. Make sure the skin is clean and dry as this will help prolong the life of the electrodes. After use, always check skin for unusual red areas, rash or other skin difficulties. If there are any skin problems, does not apply electrodes to the same area. Never remove the electrodes from the unit by pulling the wires. Do not use the TENS unit or electrodes other than as directed. Do not change electrode placement without consultating your therapist or physician. Keep 2 fingers with between each electrode.

## 2015-12-01 ENCOUNTER — Ambulatory Visit (INDEPENDENT_AMBULATORY_CARE_PROVIDER_SITE_OTHER): Payer: Self-pay | Admitting: Physical Therapy

## 2015-12-01 DIAGNOSIS — R531 Weakness: Secondary | ICD-10-CM

## 2015-12-01 DIAGNOSIS — R29898 Other symptoms and signs involving the musculoskeletal system: Secondary | ICD-10-CM

## 2015-12-01 DIAGNOSIS — R293 Abnormal posture: Secondary | ICD-10-CM

## 2015-12-01 DIAGNOSIS — Z7409 Other reduced mobility: Secondary | ICD-10-CM

## 2015-12-01 DIAGNOSIS — M623 Immobility syndrome (paraplegic): Secondary | ICD-10-CM

## 2015-12-01 DIAGNOSIS — M2560 Stiffness of unspecified joint, not elsewhere classified: Secondary | ICD-10-CM

## 2015-12-01 NOTE — Therapy (Signed)
[place], [place], [address] Phone: [phone]   Fax:  [phone]  Physical Therapy Treatment  Patient Details  Name: Michaela Aguilar MRN: [id number] Date of Birth: [birth year] Referring Provider: Dr. Lilia Argue   Encounter Date: 12/01/2015      PT End of Session - 12/01/15 0842    Visit Number 2   Number of Visits 12   Date for PT Re-Evaluation 01/10/16   PT Start Time 0814   PT Stop Time 0920   PT Time Calculation (min) 66 min   Activity Tolerance Patient tolerated treatment well;Patient limited by pain      Past Medical History  Diagnosis Date  . Kidney stones per pt.  . Bronchitis     Past Surgical History  Procedure Laterality Date  . Cesarean section    . Hernia repair      There were no vitals filed for this visit.  Visit Diagnosis:  Abnormal posture  Weakness of both lower extremities  Stiffness due to immobility  Decreased strength, endurance, and mobility      Subjective Assessment - 12/01/15 0817    Subjective Pt reports she was a little more sore after last session.  She reports she did her HEP for about 30 minutes yesterday.  "Nights are hell"- pt reports difficulty getting comfortable and sleeping at night.    Currently in Pain? Yes   Pain Score 7    Pain Location Buttocks   Pain Orientation Left;Posterior   Pain Descriptors / Indicators Constant;Nagging   Multiple Pain Sites Yes   Pain Score 7   Pain Location Neck   Pain Descriptors / Indicators Constant   Aggravating Factors  reaching, stretching LUE   Pain Relieving Factors heat             [facility] PT Assessment - 12/01/15 0001    Assessment   Medical Diagnosis Lt LB and LE pain    Referring Provider Dr. Lilia Argue    Onset Date/Surgical Date 05/19/15   Hand Dominance Right   Next MD Visit 2/17   Prior Therapy none            [facility] Adult PT Treatment/Exercise - 12/01/15 0001    Lumbar Exercises: Stretches   Passive Hamstring Stretch 4 reps;30 seconds  (2 reps sitting, 2 reps supine with strap)   Lower Trunk Rotation --  10 reps each side; 3 sec hold- to tolerance   Prone on Elbows Stretch 1 rep;30 seconds   Prone on Elbows Stretch Limitations (POE - cervical diagonals x 3 reps each side, to tolerance )   Quad Stretch 2 reps;30 seconds  prone with strap   ITB Stretch 2 reps;30 seconds  with strap supine   Piriformis Stretch 30 seconds;3 reps  each side   Lumbar Exercises: Aerobic   Stationary Bike NuStep L3: 6 min  (arms and legs)   Lumbar Exercises: Supine   Ab Set 5 reps;5 seconds   Clam 10 reps  each leg with ab set   Bridge --  2 reps; increased Lt LBP - stopped   Other Supine Lumbar Exercises Sit to/from supine via log roll x 6 reps with VC.   Modalities   Modalities Electrical Stimulation;Moist Heat   Moist Heat Therapy   Number Minutes Moist Heat 15 Minutes   Moist Heat Location Cervical;Hip   Electrical Stimulation   Electrical Stimulation Location Lt SI joint and glute med  / Lt levator  Electrical Stimulation Action premod to each area   Electrical Stimulation Parameters  to tolerance   Pt in Rt sidelying   Electrical Stimulation Goals Pain                     PT Long Term Goals - 11/29/15 1343    PT LONG TERM GOAL #1   Title Improve posture and alignment in standing and with gait 01/10/16   Time 2   Period Weeks   Status New   PT LONG TERM GOAL #2   Title Increase strength bilat LE's by 1/2 to 1 mm grade 01/10/16   Time 6   Period Weeks   Status New   PT LONG TERM GOAL #3   Title Improve standing and walking tolerance to 15-20 min 01/10/16   Time 6   Period Weeks   Status New   PT LONG TERM GOAL #4   Title I in HEP 01/10/16   Time 6   Period Weeks   Status New   PT LONG TERM GOAL #5   Title Improve FOTO to </= 44% limitation 01/10/16   Time 6   Period Weeks   Status New               Plan - 12/01/15  [id number]    Clinical Impression Statement Pt required frequent VC and tactile cues for improved form of exercises. Pt required increased time for position changes due to increased pain in neck/ LB.  Pt reported slight decrease in pain with gentle stretches and further reduction with use of estim/MHP.  Progressing towards goals.    Pt will benefit from skilled therapeutic intervention in order to improve on the following deficits Postural dysfunction;Improper body mechanics;Pain;Decreased range of motion;Decreased strength;Increased fascial restricitons;Decreased endurance;Decreased activity tolerance;Abnormal gait   Rehab Potential Good   PT Frequency 2x / week   PT Duration 6 weeks   PT Treatment/Interventions Patient/family education;ADLs/Self Care Home Management;Therapeutic exercise;Therapeutic activities;Neuromuscular re-education;Balance training;Manual techniques;Dry needling;Cryotherapy;Electrical Stimulation;Moist Heat;Ultrasound   PT Next Visit Plan progress with ROM and strengthening; gait training as indicated; modalities and manual work as indicated; TENS unit for home    Consulted and Agree with Plan of Care Patient        Problem List Patient Active Problem List   Diagnosis Date Noted  . Neck pain 11/17/2015  . Low back pain 11/17/2015    [name], PTA 12/01/2015 9:37 AM  [facility] [place], [place], [address] Phone: [phone]   Fax:  [phone]  Name: Michaela Aguilar MRN: [id number] Date of Birth: 07-Aug-1966

## 2015-12-01 NOTE — Patient Instructions (Addendum)
Supine to Sit (Active)    Sit at edge of bed.  Come down to Rt elbow.  Tuck feet under you and come off of elbow.  Shoulders and hips move at same time while you roll on to your back.   Reverse for sitting up.   Piriformis Stretch, Sitting PNF    Sit, one ankle on opposite knee, same-side hand on crossed knee. Push down on knee, keeping spine straight. Lean torso forward until tension is felt in hamstrings and gluteals of crossed-leg side. Contract hip muscles so knee rotates further toward floor, resisting with hand. Hold _30__ seconds. Release tension and bring torso closer to calf. Hold _30__ seconds.  Repeat _2__ times per session. Do __1-2_ sessions per day.  Hamstring Step 1    Straighten left knee. Keep knee level with other knee or on bolster. Hold __30_ seconds. Relax knee by returning foot to start. Repeat _2__ times.  KNEE: Quadriceps - Prone    Place strap around ankle. Bring ankle toward buttocks. Press hip into surface. Hold _30__ seconds. _2__ reps per set, _1-2__ sets per day  [place]: Double Arm    Arms near sides, palms up. Press both arms lightly into floor, slide arms out to side and up alongside head. Keep contact with floor throughout motion. At maximal position, lengthen arms. Hold _5-10__ seconds. Relax. Slide arms back to start. Repeat _10__ times.   [facility] [place], [place] [address]  [phone] (office) [phone] (fax)

## 2015-12-06 ENCOUNTER — Ambulatory Visit (INDEPENDENT_AMBULATORY_CARE_PROVIDER_SITE_OTHER): Payer: Self-pay | Admitting: Physical Therapy

## 2015-12-06 DIAGNOSIS — Z7409 Other reduced mobility: Secondary | ICD-10-CM

## 2015-12-06 DIAGNOSIS — R293 Abnormal posture: Secondary | ICD-10-CM

## 2015-12-06 DIAGNOSIS — M623 Immobility syndrome (paraplegic): Secondary | ICD-10-CM

## 2015-12-06 DIAGNOSIS — M2560 Stiffness of unspecified joint, not elsewhere classified: Secondary | ICD-10-CM

## 2015-12-06 DIAGNOSIS — R531 Weakness: Secondary | ICD-10-CM

## 2015-12-06 DIAGNOSIS — R29898 Other symptoms and signs involving the musculoskeletal system: Secondary | ICD-10-CM

## 2015-12-06 NOTE — Patient Instructions (Signed)
Abduction: Clam (Eccentric) - Side-Lying    Lie on side with knees bent. Lift top knee, keeping feet together. Keep trunk steady. Slowly lower for 3-5 seconds. _10__ reps per set, _1-2__ sets per day, _3-4__ days per week.  Shoulder: Flexion (Supine)    With hands shoulder width apart, slowly lower dowel to floor behind head. Do not let elbows bend. Keep back flat. Hold __2__ seconds. Repeat __10__ times. Do __1__ sessions per day. CAUTION: Stretch slowly and gently.   Outer Hip Stretch: Reclined IT Band Stretch (Strap)    Strap around opposite foot, pull across only as far as possible with shoulders on mat. Hold for __20__ seconds. Repeat _2___ times each leg. Piriformis Stretch, Supine  Stretching: Piriformis (Supine)    Pull right knee toward opposite shoulder. Hold __20__ seconds. Relax. Repeat _2 ___ times per set. Do _1-2___ sets per session. Do _2___ sessions per day.  [facility] at [facility] [place], [place] [address]  [phone] (office) [phone] (fax)

## 2015-12-06 NOTE — Therapy (Addendum)
[place], [place], [address] Phone: [phone]   Fax:  [phone]  Physical Therapy Treatment  Patient Details  Name: Michaela Aguilar MRN: [id number] Date of Birth: 1966-02-06 Referring Provider: Dr. Lilia Argue   Encounter Date: 12/06/2015      PT End of Session - 12/06/15 1022    Visit Number 3   Number of Visits 12   Date for PT Re-Evaluation 01/10/16   PT Start Time 0932   PT Stop Time 1029   PT Time Calculation (min) 57 min   Activity Tolerance Patient limited by pain      Past Medical History  Diagnosis Date  . Kidney stones per pt.  . Bronchitis     Past Surgical History  Procedure Laterality Date  . Cesarean section    . Hernia repair      There were no vitals filed for this visit.  Visit Diagnosis:  Abnormal posture  Weakness of both lower extremities  Stiffness due to immobility  Decreased strength, endurance, and mobility      Subjective Assessment - 12/06/15 0936    Subjective Pt reports she hasn't had numbness/tingling in foot for a week. She has been performing HEP.  Neck has been bothering her a lot since last session.  Pt reports she had 2 days of relief after using heat/ estim at session.    Currently in Pain? Yes   Pain Score 7    Pain Location Buttocks   Pain Orientation Left;Posterior   Pain Descriptors / Indicators Constant;Nagging   Aggravating Factors  prolonged positions   Pain Relieving Factors medicine, heat    Multiple Pain Sites Yes   Pain Score 8   Pain Location Neck   Pain Orientation Left   Pain Descriptors / Indicators Aching   Aggravating Factors  reaching with LUE   Pain Relieving Factors heat             [facility] PT Assessment - 12/06/15 0001    Assessment   Medical Diagnosis Lt LB and LE pain    Referring Provider Dr. Lilia Argue    Onset Date/Surgical Date 05/19/15   Hand Dominance Right   Next MD Visit 12/21/15           [facility] Adult PT Treatment/Exercise - 12/06/15 0001    Exercises   Exercises Lumbar;Neck   Neck Exercises: Supine   Shoulder Flexion Right;Left;10 reps  AROM (limited on LUE due to pain)   Shoulder Flexion Limitations repeated with AAROM with cane to increase ROM in Lt shoulder    Lumbar Exercises: Stretches   Passive Hamstring Stretch 4 reps;30 seconds  (2 reps sitting, 2 reps supine with strap)   Lower Trunk Rotation --  10 reps each side; 3 sec hold- to tolerance   Pelvic Tilt --  10reps of each   Pelvic Tilt Limitations sitting on blue disc; ant/post, side to side, CW/ CCW   ITB Stretch 2 reps;30 seconds  with strap supine   Piriformis Stretch 30 seconds;3 reps  each side   Lumbar Exercises: Seated   Other Seated Lumbar Exercises seated neural flossing with LLE   Lumbar Exercises: Sidelying   Clam 10 reps  2 sets each side   Modalities   Modalities Electrical Stimulation;Moist Heat   Moist Heat Therapy   Number Minutes Moist Heat 15 Minutes   Moist Heat Location Cervical;Hip   Electrical Stimulation   Electrical Stimulation Location Lt SI joint  and glute med  / Lt Materials engineer premod to each area   Printmaker Parameters to tolerance    Electrical Stimulation Goals Pain  pt in hooklying   Neck Exercises: Stretches   Upper Trapezius Stretch 3 reps;20 seconds   Other Neck Stretches seated elbows on knees flexion to neutral x 5 reps (difficult tolerating this ROM)                PT Education - 12/06/15 1027    Education provided Yes   Education Details HEP - added exercises.    Person(s) Educated Patient   Methods Explanation;Handout   Comprehension Verbalized understanding;Returned demonstration             PT Long Term Goals - 11/29/15 1343    PT LONG TERM GOAL #1   Title Improve posture and alignment in standing and with gait 01/10/16   Time 2   Period Weeks   Status New   PT LONG TERM GOAL #2   Title  Increase strength bilat LE's by 1/2 to 1 mm grade 01/10/16   Time 6   Period Weeks   Status New   PT LONG TERM GOAL #3   Title Improve standing and walking tolerance to 15-20 min 01/10/16   Time 6   Period Weeks   Status New   PT LONG TERM GOAL #4   Title I in HEP 01/10/16   Time 6   Period Weeks   Status New   PT LONG TERM GOAL #5   Title Improve FOTO to </= 44% limitation 01/10/16   Time 6   Period Weeks   Status New               Plan - 12/06/15 1016    Clinical Impression Statement Pt tolerated exercises well without increase in pain.  Pt reported slight decrease in Lt hip and neck with exercise; further decreased with estim/MHP at end of session.  Gradual progress towards goals.    Pt will benefit from skilled therapeutic intervention in order to improve on the following deficits Postural dysfunction;Improper body mechanics;Pain;Decreased range of motion;Decreased strength;Increased fascial restricitons;Decreased endurance;Decreased activity tolerance;Abnormal gait   Rehab Potential Good   PT Frequency 2x / week   PT Duration 6 weeks   PT Treatment/Interventions Patient/family education;ADLs/Self Care Home Management;Therapeutic exercise;Therapeutic activities;Neuromuscular re-education;Balance training;Manual techniques;Dry needling;Cryotherapy;Electrical Stimulation;Moist Heat;Ultrasound   PT Next Visit Plan progress with ROM and strengthening; gait training as indicated; modalities and manual work as indicated - trial of mobs. (add 4 way hip to HEP, as tolerated)   Consulted and Agree with Plan of Care Patient        Problem List Patient Active Problem List   Diagnosis Date Noted  . Neck pain 11/17/2015  . Low back pain 11/17/2015    [name], PTA 12/06/2015 10:38 AM  [facility] [place], [place], [address] Phone: [phone]   Fax:  [phone]  Name: Michaela Aguilar MRN:  [id number] Date of Birth: May 22, 1966

## 2015-12-07 ENCOUNTER — Emergency Department (HOSPITAL_COMMUNITY)
Admission: EM | Admit: 2015-12-07 | Discharge: 2015-12-07 | Disposition: A | Discharge status: Home / Self Care | Payer: Self-pay | Attending: Emergency Medicine | Admitting: Emergency Medicine

## 2015-12-07 ENCOUNTER — Emergency Department (HOSPITAL_COMMUNITY): Payer: Self-pay

## 2015-12-07 ENCOUNTER — Encounter (HOSPITAL_COMMUNITY): Payer: Self-pay | Admitting: *Deleted

## 2015-12-07 DIAGNOSIS — N23 Unspecified renal colic: Secondary | ICD-10-CM | POA: Insufficient documentation

## 2015-12-07 DIAGNOSIS — N200 Calculus of kidney: Secondary | ICD-10-CM | POA: Insufficient documentation

## 2015-12-07 DIAGNOSIS — Z87891 Personal history of nicotine dependence: Secondary | ICD-10-CM | POA: Insufficient documentation

## 2015-12-07 DIAGNOSIS — Z87442 Personal history of urinary calculi: Secondary | ICD-10-CM | POA: Insufficient documentation

## 2015-12-07 DIAGNOSIS — R109 Unspecified abdominal pain: Secondary | ICD-10-CM

## 2015-12-07 DIAGNOSIS — Z8709 Personal history of other diseases of the respiratory system: Secondary | ICD-10-CM | POA: Insufficient documentation

## 2015-12-07 LAB — URINALYSIS, ROUTINE W REFLEX MICROSCOPIC
Bilirubin Urine: NEGATIVE
Glucose, UA: NEGATIVE mg/dL
LEUKOCYTES UA: NEGATIVE
NITRITE: NEGATIVE
PROTEIN: NEGATIVE mg/dL
Specific Gravity, Urine: >1.03 — ABNORMAL HIGH (ref 1.005–1.030)
pH: 5.5 (ref 5.0–8.0)

## 2015-12-07 LAB — URINE MICROSCOPIC-ADD ON
BACTERIA UA: NONE SEEN
WBC UA: NONE SEEN WBC/hpf (ref 0–5)

## 2015-12-07 MED ORDER — FENTANYL CITRATE (PF) 100 MCG/2ML IJ SOLN
100.0000 ug | Freq: Once | INTRAMUSCULAR | Status: AC
  Administered 2015-12-07: 100 ug via INTRAVENOUS
  Filled 2015-12-07: qty 2

## 2015-12-07 MED ORDER — OXYCODONE-ACETAMINOPHEN 5-325 MG PO TABS: 1.0000 | ORAL_TABLET | Freq: Four times a day (QID) | ORAL | Status: DC | PRN

## 2015-12-07 MED ORDER — TAMSULOSIN HCL 0.4 MG PO CAPS: 0.4000 mg | ORAL_CAPSULE | Freq: Every day | ORAL | Status: DC

## 2015-12-07 MED ORDER — MORPHINE SULFATE (PF) 4 MG/ML IV SOLN
8.0000 mg | Freq: Once | INTRAVENOUS | Status: AC
  Administered 2015-12-07: 8 mg via INTRAVENOUS
  Filled 2015-12-07: qty 2

## 2015-12-07 MED ORDER — PROCHLORPERAZINE EDISYLATE 5 MG/ML IJ SOLN
5.0000 mg | Freq: Once | INTRAMUSCULAR | Status: AC
  Administered 2015-12-07: 5 mg via INTRAVENOUS
  Filled 2015-12-07: qty 2

## 2015-12-07 NOTE — ED Provider Notes (Signed)
CSN: [id number]     Arrival date & time 12/07/15  [id number] History   First MD Initiated Contact with Patient 12/07/15 0932     Chief Complaint  Patient presents with  . Flank Pain     (Consider location/radiation/quality/duration/timing/severity/associated sxs/prior Treatment) Patient is a 50 y.o. female presenting with flank pain. The history is provided by the patient.  Flank Pain This is a recurrent problem. The current episode started yesterday. The problem occurs intermittently. The problem has been unchanged. Associated symptoms include nausea. Pertinent negatives include no chills, fever or vomiting. Nothing aggravates the symptoms. She has tried nothing for the symptoms. The treatment provided no relief.    Past Medical History  Diagnosis Date  . Bronchitis   . Kidney stones per pt.   Past Surgical History  Procedure Laterality Date  . Cesarean section    . Hernia repair     History reviewed. No pertinent family history. Social History  Substance Use Topics  . Smoking status: Former Smoker    Quit date: 08/08/2013  . Smokeless tobacco: None  . Alcohol Use: No   OB History    No data available     Review of Systems  Constitutional: Negative for fever and chills.  Gastrointestinal: Positive for nausea. Negative for vomiting.  Genitourinary: Positive for flank pain.  All other systems reviewed and are negative.     Allergies  Review of patient's allergies indicates no known allergies.  Home Medications   Prior to Admission medications   Medication Sig Start Date End Date Taking? Authorizing Provider  acetaminophen (TYLENOL) 500 MG tablet Take 500 mg by mouth every 6 (six) hours as needed for mild pain or moderate pain.    Historical Provider, MD  ALPRAZolam Duanne Moron) 1 MG tablet Take 1 mg by mouth 2 (two) times daily as needed for anxiety. For pain    Historical Provider, MD  cyclobenzaprine (FLEXERIL) 10 MG tablet Take 1 tablet (10 mg total) by mouth 2 (two)  times daily as needed for muscle spasms. 11/06/15   Nat Christen, MD  naproxen (NAPROSYN) 500 MG tablet Take 1 tablet (500 mg total) by mouth 2 (two) times daily. Patient not taking: Reported on 11/29/2015 11/06/15   Nat Christen, MD  oxyCODONE-acetaminophen (PERCOCET/ROXICET) 5-325 MG tablet 1 or 2 tabs PO q6h prn pain Patient not taking: Reported on 11/29/2015 11/13/15   Davonna Belling, MD  tamsulosin (FLOMAX) 0.4 MG CAPS capsule Take 1 capsule (0.4 mg total) by mouth at bedtime. Patient not taking: Reported on 11/29/2015 11/13/15   Davonna Belling, MD   BP 126/82 mmHg  Pulse 75  Temp(Src) 97.9 F (36.6 C) (Oral)  Resp 16  Ht 5\' 5"  (1.651 m)  Wt 106.142 kg  BMI 38.94 kg/m2  SpO2 98%  LMP 02/16/2014 Physical Exam  Constitutional: She is oriented to person, place, and time. She appears well-developed and well-nourished.  Non-toxic appearance. She appears ill.  HENT:  Head: Normocephalic.  Right Ear: Tympanic membrane and external ear normal.  Left Ear: Tympanic membrane and external ear normal.  Eyes: EOM and lids are normal. Pupils are equal, round, and reactive to light.  Neck: Normal range of motion. Neck supple. Carotid bruit is not present.  Cardiovascular: Normal rate, regular rhythm, normal heart sounds, intact distal pulses and normal pulses.   Pulmonary/Chest: Breath sounds normal. No respiratory distress.  Abdominal: Soft. Bowel sounds are normal. There is no hepatosplenomegaly. There is tenderness in the right lower quadrant and periumbilical area. There is  CVA tenderness. There is no guarding.  Musculoskeletal: Normal range of motion.  Lymphadenopathy:       Head (right side): No submandibular adenopathy present.       Head (left side): No submandibular adenopathy present.    She has no cervical adenopathy.  Neurological: She is alert and oriented to person, place, and time. She has normal strength. No cranial nerve deficit or sensory deficit.  Skin: Skin is warm and dry.   Psychiatric: She has a normal mood and affect. Her speech is normal.  Nursing note and vitals reviewed.   ED Course  Procedures (including critical care time) Labs Review Labs Reviewed - No data to display  Imaging Review No results found. I have personally reviewed and evaluated these images and lab results as part of my medical decision-making.   EKG Interpretation None      MDM  Vital signs stable. Pulse ox 99% on room air. UA reveals a Sp Grav of 1.030 with a trace or Ketones. CT reveals bilat nonobstructive nephrolithiasis.. No hydroureter. Pt will strain all urine. Rx for flomax and percocet given to the patient. Pt to follow up with [facility].   Final diagnoses:  None    *I have reviewed nursing notes, vital signs, and all appropriate lab and imaging results for this patient.[address], PA-C 12/08/15 [id number]  Milton Ferguson, MD 12/10/15 [phone]

## 2015-12-07 NOTE — ED Notes (Signed)
Pt. C/o right flank pain, nausea. Denies vomiting. Reports hx of kidney stones and this pain is similar.

## 2015-12-07 NOTE — ED Notes (Signed)
Pt called out requesting more pain medication. States the fentanyl did not help. PA Lily Kocher made aware.

## 2015-12-07 NOTE — Discharge Instructions (Signed)
You have multiple stones in the right kidney. Please see MD at the [facility] practice as soon as possible. Kidney Stones Kidney stones (urolithiasis) are solid masses that form inside your kidneys. The intense pain is caused by the stone moving through the kidney, ureter, bladder, and urethra (urinary tract). When the stone moves, the ureter starts to spasm around the stone. The stone is usually passed in your pee (urine).  HOME CARE  Drink enough fluids to keep your pee clear or pale yellow. This helps to get the stone out.  Take a 24-hour pee (urine) sample as told by your doctor. You may need to take another sample every 6-12 months.  Strain all pee through the provided strainer. Do not pee without peeing through the strainer, not even once. If you pee the stone out, catch it in the strainer. The stone may be as small as a grain of salt. Take this to your doctor. This will help your doctor figure out what you can do to try to prevent more kidney stones.  Only take medicine as told by your doctor.  Make changes to your daily diet as told by your doctor. You may be told to:  Limit how much salt you eat.  Eat 5 or more servings of fruits and vegetables each day.  Limit how much meat, poultry, fish, and eggs you eat.  Keep all follow-up visits as told by your doctor. This is important.  Get follow-up X-rays as told by your doctor. GET HELP IF: You have pain that gets worse even if you have been taking pain medicine. GET HELP RIGHT AWAY IF:   Your pain does not get better with medicine.  You have a fever or shaking chills.  Your pain increases and gets worse over 18 hours.  You have new belly (abdominal) pain.  You feel faint or pass out.  You are unable to pee.   This information is not intended to replace advice given to you by your health care provider. Make sure you discuss any questions you have with your health care provider.   Document Released: 04/17/2008  Document Revised: 07/21/2015 Document Reviewed: 04/02/2013 Elsevier Interactive Patient Education [employer].

## 2015-12-08 ENCOUNTER — Ambulatory Visit (INDEPENDENT_AMBULATORY_CARE_PROVIDER_SITE_OTHER): Payer: Self-pay | Admitting: Rehabilitative and Restorative Service Providers"

## 2015-12-08 ENCOUNTER — Encounter: Payer: Self-pay | Admitting: Rehabilitative and Restorative Service Providers"

## 2015-12-08 ENCOUNTER — Encounter: Payer: Self-pay | Admitting: Physical Therapy

## 2015-12-08 DIAGNOSIS — M623 Immobility syndrome (paraplegic): Secondary | ICD-10-CM

## 2015-12-08 DIAGNOSIS — R293 Abnormal posture: Secondary | ICD-10-CM

## 2015-12-08 DIAGNOSIS — R531 Weakness: Secondary | ICD-10-CM

## 2015-12-08 DIAGNOSIS — Z7409 Other reduced mobility: Secondary | ICD-10-CM

## 2015-12-08 DIAGNOSIS — R29898 Other symptoms and signs involving the musculoskeletal system: Secondary | ICD-10-CM

## 2015-12-08 DIAGNOSIS — M2560 Stiffness of unspecified joint, not elsewhere classified: Secondary | ICD-10-CM

## 2015-12-08 NOTE — Therapy (Signed)
[place] [address] [place], [place], [address] Phone: [phone]   Fax:  [phone]  Physical Therapy Treatment  Patient Details  Name: Michaela Aguilar MRN: [id number] Date of Birth: 1965-12-11 Referring Provider: Dr. Lilia Argue   Encounter Date: 12/08/2015      PT End of Session - 12/08/15 0931    Visit Number 4   Number of Visits 12   Date for PT Re-Evaluation 01/10/16   PT Start Time 0931   PT Stop Time 1025   PT Time Calculation (min) 54 min   Activity Tolerance Patient limited by pain;Patient tolerated treatment well      Past Medical History  Diagnosis Date  . Bronchitis   . Kidney stones per pt.    Past Surgical History  Procedure Laterality Date  . Cesarean section    . Hernia repair      There were no vitals filed for this visit.  Visit Diagnosis:  Abnormal posture  Weakness of both lower extremities  Stiffness due to immobility  Decreased strength, endurance, and mobility      Subjective Assessment - 12/08/15 0932    Subjective Patient reports that symptoms 'feel about the same'. Patient states that she has an hour or two of relief following therapy but then the symptoms return. She was at the hospital for 6 hours yesterday with kidney stones. She was treated with medication but she has had continued pain.    Currently in Pain? Yes   Pain Score 7    Pain Location Flank   Pain Orientation Left   Pain Descriptors / Indicators Constant;Throbbing   Pain Type Chronic pain   Pain Onset More than a month ago   Pain Score 7   Pain Location Neck   Pain Orientation Left   Pain Descriptors / Indicators Nagging   Pain Type Chronic pain   Pain Radiating Towards up to back of head and down arm                          [facility] Adult PT Treatment/Exercise - 12/08/15 0001    Exercises   Exercises Lumbar;Neck   Neck Exercises: Supine   Shoulder Flexion Right;Left;10 reps   Lumbar Exercises: Stretches   Passive Hamstring Stretch 3 reps;30 seconds   Lower Trunk Rotation --  10 reps each side; 3 sec hold- to tolerance   Quad Stretch 2 reps;30 seconds   ITB Stretch 2 reps;30 seconds  with strap supine   Piriformis Stretch 30 seconds;3 reps  each side   Lumbar Exercises: Aerobic   Stationary Bike NuStep L4: 7 min legs   Lumbar Exercises: Supine   AB Set Limitations 3 part core 10 sec hold x 10    Lumbar Exercises: Prone   Other Prone Lumbar Exercises pelvic press 5 sec x10   Modalities   Modalities Electrical Stimulation;Moist Heat   Moist Heat Therapy   Number Minutes Moist Heat 15 Minutes   Moist Heat Location Cervical;Hip   Electrical Stimulation   Electrical Stimulation Location Lt SI joint and glute med  / Lt levator   Electrical Stimulation Action IFC lumbar/TENS unit shd    Electrical Stimulation Parameters to tolerance   Electrical Stimulation Goals Pain;Tone   Manual Therapy   Soft tissue mobilization trigger point and soft tissue work Lt sacral/hip area and Lt upper trap/leveator - trigger points noted at glut med/piriformis/gluts/multifidi/paraspinals/upper trap/leveator/cervical musculature Lt  PT Education - 12/08/15 1203    Education provided Yes   Education Details use of friend's TENS unit    Person(s) Educated Patient   Methods Explanation;Demonstration;Tactile cues;Verbal cues;Handout   Comprehension Verbalized understanding;Returned demonstration;Verbal cues required;Tactile cues required             PT Long Term Goals - 12/08/15 1213    PT LONG TERM GOAL #1   Title Improve posture and alignment in standing and with gait 01/10/16   Time 2   Period Weeks   Status On-going   PT LONG TERM GOAL #2   Title Increase strength bilat LE's by 1/2 to 1 mm grade 01/10/16   Time 6   Period Weeks   Status On-going   PT LONG TERM GOAL #3   Title Improve standing and walking tolerance to 15-20 min 01/10/16    Time 6   Period Weeks   Status On-going   PT LONG TERM GOAL #4   Title I in HEP 01/10/16   Time 6   Period Weeks   Status On-going   PT LONG TERM GOAL #5   Title Improve FOTO to </= 44% limitation 01/10/16   Time 6   Period Weeks   Status On-going               Plan - 12/08/15 1210    Clinical Impression Statement Patient has multiple trigger points and soft tissue tightness Lt upper and lower quarters. She may benefit from trial of TDN to assess response. She responds well to TENS/estim and now has a borrowed unit for home which she was instructed in use of today. Pt spent 6 hours in the ED with kidney stones yesterday so exercises were more limited today.    Pt will benefit from skilled therapeutic intervention in order to improve on the following deficits Postural dysfunction;Improper body mechanics;Pain;Decreased range of motion;Decreased strength;Increased fascial restricitons;Decreased endurance;Decreased activity tolerance;Abnormal gait   Rehab Potential Good   PT Frequency 2x / week   PT Duration 6 weeks   PT Treatment/Interventions Patient/family education;ADLs/Self Care Home Management;Therapeutic exercise;Therapeutic activities;Neuromuscular re-education;Balance training;Manual techniques;Dry needling;Cryotherapy;Electrical Stimulation;Moist Heat;Ultrasound   PT Next Visit Plan progress with ROM and strengthening; gait training as indicated; modalities and manual work as indicated - trial of mobs; trial of TDN   PT Home Exercise Plan HEP; instructed in use of friend's TENS unit for pain management   Consulted and Agree with Plan of Care Patient        Problem List Patient Active Problem List   Diagnosis Date Noted  . Neck pain 11/17/2015  . Low back pain 11/17/2015    Wai Litt Nilda Simmer PT, MPH  12/08/2015, 12:15 PM  [facility] [place], [place], [address] Phone: [phone]   Fax:   [phone]  Name: TENIOLA ESPOSITO MRN: [id number] Date of Birth: 1965-12-31

## 2015-12-08 NOTE — Patient Instructions (Signed)
Pelvic Press    Place hands under belly between navel and pubic bone, palms up. Feel pressure on hands. Increase pressure on hands by pressing pelvis down. This is NOT a pelvic tilt. Hold __5_ seconds. Relax. Repeat _10_ times.   Trigger Point Dry Needling  . What is Trigger Point Dry Needling (DN)? o DN is a physical therapy technique used to treat muscle pain and dysfunction. Specifically, DN helps deactivate muscle trigger points (muscle knots).  o A thin filiform needle is used to penetrate the skin and stimulate the underlying trigger point. The goal is for a local twitch response (LTR) to occur and for the trigger point to relax. No medication of any kind is injected during the procedure.   . What Does Trigger Point Dry Needling Feel Like?  o The procedure feels different for each individual patient. Some patients report that they do not actually feel the needle enter the skin and overall the process is not painful. Very mild bleeding may occur. However, many patients feel a deep cramping in the muscle in which the needle was inserted. This is the local twitch response.   Marland Kitchen How Will I feel after the treatment? o Soreness is normal, and the onset of soreness may not occur for a few hours. Typically this soreness does not last longer than two days.  o Bruising is uncommon, however; ice can be used to decrease any possible bruising.  o In rare cases feeling tired or nauseous after the treatment is normal. In addition, your symptoms may get worse before they get better, this period will typically not last longer than 24 hours.   . What Can I do After My Treatment? o Increase your hydration by drinking more water for the next 24 hours. o You may place ice or heat on the areas treated that have become sore, however, do not use heat on inflamed or bruised areas. Heat often brings more relief post needling. o You can continue your regular activities, but vigorous activity is not recommended  initially after the treatment for 24 hours. o DN is best combined with other physical therapy such as strengthening, stretching, and other therapies.

## 2015-12-12 ENCOUNTER — Emergency Department (HOSPITAL_COMMUNITY)
Admission: EM | Admit: 2015-12-12 | Discharge: 2015-12-13 | Disposition: A | Discharge status: Home / Self Care | Payer: Self-pay | Attending: Emergency Medicine | Admitting: Emergency Medicine

## 2015-12-12 DIAGNOSIS — R1084 Generalized abdominal pain: Secondary | ICD-10-CM | POA: Insufficient documentation

## 2015-12-12 DIAGNOSIS — Z8709 Personal history of other diseases of the respiratory system: Secondary | ICD-10-CM | POA: Insufficient documentation

## 2015-12-12 DIAGNOSIS — Z87891 Personal history of nicotine dependence: Secondary | ICD-10-CM | POA: Insufficient documentation

## 2015-12-12 DIAGNOSIS — R112 Nausea with vomiting, unspecified: Secondary | ICD-10-CM | POA: Insufficient documentation

## 2015-12-12 DIAGNOSIS — Z87442 Personal history of urinary calculi: Secondary | ICD-10-CM | POA: Insufficient documentation

## 2015-12-12 DIAGNOSIS — F419 Anxiety disorder, unspecified: Secondary | ICD-10-CM | POA: Insufficient documentation

## 2015-12-12 DIAGNOSIS — R109 Unspecified abdominal pain: Secondary | ICD-10-CM

## 2015-12-13 ENCOUNTER — Encounter: Payer: Self-pay | Admitting: Rehabilitative and Restorative Service Providers"

## 2015-12-13 ENCOUNTER — Encounter (HOSPITAL_COMMUNITY): Payer: Self-pay | Admitting: Emergency Medicine

## 2015-12-13 LAB — RAPID URINE DRUG SCREEN, HOSP PERFORMED
AMPHETAMINES: NOT DETECTED
BARBITURATES: NOT DETECTED
BENZODIAZEPINES: POSITIVE — AB
Cocaine: NOT DETECTED
Opiates: NOT DETECTED
TETRAHYDROCANNABINOL: POSITIVE — AB

## 2015-12-13 LAB — URINE MICROSCOPIC-ADD ON

## 2015-12-13 LAB — URINALYSIS, ROUTINE W REFLEX MICROSCOPIC
Bilirubin Urine: NEGATIVE
GLUCOSE, UA: NEGATIVE mg/dL
Ketones, ur: NEGATIVE mg/dL
LEUKOCYTES UA: NEGATIVE
Nitrite: NEGATIVE
PH: 6 (ref 5.0–8.0)
Specific Gravity, Urine: >1.03 — ABNORMAL HIGH (ref 1.005–1.030)

## 2015-12-13 MED ORDER — TRAMADOL HCL 50 MG PO TABS: 100.0000 mg | ORAL_TABLET | Freq: Four times a day (QID) | ORAL | Status: DC | PRN

## 2015-12-13 MED ORDER — NAPROXEN 500 MG PO TABS: ORAL_TABLET | ORAL | Status: DC

## 2015-12-13 MED ORDER — SODIUM CHLORIDE 0.9 % IV SOLN
INTRAVENOUS | Status: DC
  Administered 2015-12-13: 01:00:00 via INTRAVENOUS

## 2015-12-13 MED ORDER — LORAZEPAM 2 MG/ML IJ SOLN
1.0000 mg | Freq: Once | INTRAMUSCULAR | Status: AC
  Administered 2015-12-13: 1 mg via INTRAVENOUS
  Filled 2015-12-13: qty 1

## 2015-12-13 MED ORDER — ONDANSETRON HCL 4 MG PO TABS: 4.0000 mg | ORAL_TABLET | Freq: Three times a day (TID) | ORAL | Status: DC | PRN

## 2015-12-13 MED ORDER — KETOROLAC TROMETHAMINE 30 MG/ML IJ SOLN
30.0000 mg | Freq: Once | INTRAMUSCULAR | Status: AC
  Administered 2015-12-13: 30 mg via INTRAVENOUS
  Filled 2015-12-13: qty 1

## 2015-12-13 MED ORDER — FENTANYL CITRATE (PF) 100 MCG/2ML IJ SOLN
50.0000 ug | Freq: Once | INTRAMUSCULAR | Status: AC
  Administered 2015-12-13: 50 ug via INTRAVENOUS
  Filled 2015-12-13: qty 2

## 2015-12-13 MED ORDER — DIPHENHYDRAMINE HCL 50 MG/ML IJ SOLN
25.0000 mg | Freq: Once | INTRAMUSCULAR | Status: AC
  Administered 2015-12-13: 25 mg via INTRAVENOUS
  Filled 2015-12-13: qty 1

## 2015-12-13 MED ORDER — METOCLOPRAMIDE HCL 5 MG/ML IJ SOLN
10.0000 mg | Freq: Once | INTRAMUSCULAR | Status: AC
  Administered 2015-12-13: 10 mg via INTRAVENOUS
  Filled 2015-12-13: qty 2

## 2015-12-13 MED ORDER — CYCLOBENZAPRINE HCL 10 MG PO TABS: 10.0000 mg | ORAL_TABLET | Freq: Three times a day (TID) | ORAL | Status: DC | PRN

## 2015-12-13 NOTE — Discharge Instructions (Signed)
Drink plenty of fluids. Take the medications as prescribed. Follow up with [facility] that you have already scheduled.    Flank Pain Flank pain is pain in your side. The flank is the area of your side between your upper belly (abdomen) and your back. Pain in this area can be caused by many different things. [place] care and treatment will depend on the cause of your pain.  Rest as told by your doctor.  Drink enough fluids to keep your pee (urine) clear or pale yellow.  Only take medicine as told by your doctor.  Tell your doctor about any changes in your pain.  Follow up with your doctor. GET HELP RIGHT AWAY IF:   Your pain does not get better with medicine.   You have new symptoms or your symptoms get worse.  Your pain gets worse.   You have belly (abdominal) pain.   You are short of breath.   You always feel sick to your stomach (nauseous).   You keep throwing up (vomiting).   You have puffiness (swelling) in your belly.   You feel light-headed or you pass out (faint).   You have blood in your pee.  You have a fever or lasting symptoms for more than 2-3 days.  You have a fever and your symptoms suddenly get worse. MAKE SURE YOU:   Understand these instructions.  Will watch your condition.  Will get help right away if you are not doing well or get worse.   This information is not intended to replace advice given to you by your health care provider. Make sure you discuss any questions you have with your health care provider.   Document Released: 08/08/2008 Document Revised: 11/20/2014 Document Reviewed: 06/13/2012 Elsevier Interactive Patient Education [employer].

## 2015-12-13 NOTE — ED Provider Notes (Signed)
CSN: [id number]     Arrival date & time 12/12/15  2347 History  By signing my name below, I, [facility], attest that this documentation has been prepared under the direction and in the presence of Rolland Porter, MD at [phone]. Electronically Signed: Virgel Bouquet, ED Scribe. 12/13/2015. 3:43 AM.   Chief Complaint  Patient presents with  . Flank Pain   The history is provided by the patient. No language interpreter was used.   HPI Comments: Michaela Aguilar is a 50 y.o. female with an hx of anxiety and kidney stones who presents to the Emergency Department complaining of constant, moderate, unchanging, aching right flank pain that radiates into her abdomen into her RLQ, onset earlier today about 2 pm. Patient reports that she was seen in the [facility] ED last week for similar symptoms, diagnosed with multiple kidney stones, and advised to follow-up with a urologist (appointment coming up). She endorses nausea and vomiting twice today. Pain is unchanged by movement and palpation and not improved by anything. She has tried soaking in a warm bathtub without relief. Per patient, she has an hx of anxiety and is being treated at [facility] for this condition. She denies smoking and EtOH consumption. Patient denies frequent consumption of milk, coffee, and tea and notes that she mostly drinks water and one soda daily. She denies hematuria and fevers.  No PCP Psych Dr Reece Levy at [facility]  Past Medical History  Diagnosis Date  . Bronchitis   . Kidney stones per pt.   Past Surgical History  Procedure Laterality Date  . Cesarean section    . Hernia repair     History reviewed. No pertinent family history. Social History  Substance Use Topics  . Smoking status: Former Smoker    Quit date: 08/08/2013  . Smokeless tobacco: None  . Alcohol Use: No   unemployed  OB History    No data available     Review of Systems  Constitutional: Negative for fever.  Gastrointestinal: Positive for nausea and  vomiting.  Genitourinary: Positive for flank pain. Negative for hematuria.      Allergies  Review of patient's allergies indicates no known allergies.  Home Medications   Prior to Admission medications   Medication Sig Start Date End Date Taking? Authorizing Provider  acetaminophen (TYLENOL) 500 MG tablet Take 500 mg by mouth every 6 (six) hours as needed for mild pain or moderate pain.    Historical Provider, MD  ALPRAZolam Duanne Moron) 1 MG tablet Take 1 mg by mouth 2 (two) times daily as needed for anxiety. For pain    Historical Provider, MD  cyclobenzaprine (FLEXERIL) 10 MG tablet Take 1 tablet (10 mg total) by mouth 3 (three) times daily as needed (muscle soreness). 12/13/15   Rolland Porter, MD  naproxen (NAPROSYN) 500 MG tablet Take 1 po BID with food prn pain 12/13/15   Rolland Porter, MD  ondansetron (ZOFRAN) 4 MG tablet Take 1 tablet (4 mg total) by mouth every 8 (eight) hours as needed for nausea or vomiting. 12/13/15   Rolland Porter, MD  oxyCODONE-acetaminophen (PERCOCET/ROXICET) 5-325 MG tablet Take 1 tablet by mouth every 6 (six) hours as needed. 12/07/15   Lily Kocher, PA-C  tamsulosin (FLOMAX) 0.4 MG CAPS capsule Take 1 capsule (0.4 mg total) by mouth at bedtime. 12/07/15   Lily Kocher, PA-C  tamsulosin (FLOMAX) 0.4 MG CAPS capsule Take 1 capsule (0.4 mg total) by mouth at bedtime. 12/07/15   Lily Kocher, PA-C  traMADol Veatrice Bourbon) 50  MG tablet Take 2 tablets (100 mg total) by mouth every 6 (six) hours as needed. 12/13/15   Rolland Porter, MD   BP 141/97 mmHg  Pulse 72  Temp(Src) 98.4 F (36.9 C) (Oral)  Resp 24  Ht 5\' 5"  (1.651 m)  Wt 234 lb (106.142 kg)  BMI 38.94 kg/m2  SpO2 98%  LMP 02/16/2014  Vital signs normal   Physical Exam  Constitutional: She is oriented to person, place, and time. She appears well-developed and well-nourished.  Non-toxic appearance. She does not appear ill. No distress.  IN the bathroom when we entered the room. Seems uncomfortable.   HENT:  Head:  Normocephalic and atraumatic.  Right Ear: External ear normal.  Left Ear: External ear normal.  Nose: Nose normal. No mucosal edema or rhinorrhea.  Mouth/Throat: Oropharynx is clear and moist and mucous membranes are normal. No dental abscesses or uvula swelling.  Eyes: Conjunctivae and EOM are normal. Pupils are equal, round, and reactive to light.  Neck: Normal range of motion and full passive range of motion without pain. Neck supple.  Cardiovascular: Normal rate, regular rhythm and normal heart sounds.  Exam reveals no gallop and no friction rub.   No murmur heard. Pulmonary/Chest: Effort normal and breath sounds normal. No respiratory distress. She has no wheezes. She has no rhonchi. She has no rales. She exhibits no tenderness and no crepitus.  Abdominal: Soft. Normal appearance and bowel sounds are normal. She exhibits no distension. There is no tenderness. There is no rebound and no guarding.  Has bilateral flank pain to percussion and diffuse abdominal pain to palpation.  Musculoskeletal: Normal range of motion. She exhibits no edema or tenderness.  Moves all extremities well.   Neurological: She is alert and oriented to person, place, and time. She has normal strength. No cranial nerve deficit.  Skin: Skin is warm, dry and intact. No rash noted. No erythema. No pallor.  Psychiatric: She has a normal mood and affect. Her speech is normal and behavior is normal. Her mood appears not anxious.  Nursing note and vitals reviewed.   ED Course  Procedures   Medications  0.9 %  sodium chloride infusion ( Intravenous Stopped 12/13/15 0228)  metoCLOPramide (REGLAN) injection 10 mg (10 mg Intravenous Given 12/13/15 0101)  diphenhydrAMINE (BENADRYL) injection 25 mg (25 mg Intravenous Given 12/13/15 0100)  ketorolac (TORADOL) 30 MG/ML injection 30 mg (30 mg Intravenous Given 12/13/15 0100)  LORazepam (ATIVAN) injection 1 mg (1 mg Intravenous Given 12/13/15 0148)  fentaNYL (SUBLIMAZE) injection 50  mcg (50 mcg Intravenous Given 12/13/15 0148)     DIAGNOSTIC STUDIES: Oxygen Saturation is 98% on RA, normal by my interpretation.    COORDINATION OF CARE: 12:38 AM Will order pain medication and labs. Discussed treatment plan with pt at bedside and pt agreed to plan. Patient was given IV Toradol and Reglan with Benadryl for her nausea and vomiting and pain. I reviewed patient's recent CT scan which showed she did have bilateral renal stones however she did not have any ureteral stones.  1:30 AM patient still complaining of pain. She was given Ativan and fentanyl.  03:30 AM pt is sleeping, snoring, states still has some pain and rubs her left side (pain was on the right side initially).   Review of the [place] shows patient gets #120 alprazolam monthly, the last field was January 21. She has had for oxycodone 5/325 prescriptions in the past 6 months. These vary from #30, #3, #2, and #5 pills.  These are all prescribed from our emergency department.  Labs Review Results for orders placed or performed during the hospital encounter of 12/12/15  Urinalysis, Routine w reflex microscopic  Result Value Ref Range   Color, Urine YELLOW YELLOW   APPearance CLEAR CLEAR   Specific Gravity, Urine >1.030 (H) 1.005 - 1.030   pH 6.0 5.0 - 8.0   Glucose, UA NEGATIVE NEGATIVE mg/dL   Hgb urine dipstick MODERATE (A) NEGATIVE   Bilirubin Urine NEGATIVE NEGATIVE   Ketones, ur NEGATIVE NEGATIVE mg/dL   Protein, ur TRACE (A) NEGATIVE mg/dL   Nitrite NEGATIVE NEGATIVE   Leukocytes, UA NEGATIVE NEGATIVE  Urine rapid drug screen (hosp performed)  Result Value Ref Range   Opiates NONE DETECTED NONE DETECTED   Cocaine NONE DETECTED NONE DETECTED   Benzodiazepines POSITIVE (A) NONE DETECTED   Amphetamines NONE DETECTED NONE DETECTED   Tetrahydrocannabinol POSITIVE (A) NONE DETECTED   Barbiturates NONE DETECTED NONE DETECTED  Urine microscopic-add on  Result Value Ref Range   Squamous  Epithelial / LPF 0-5 (A) NONE SEEN   WBC, UA 0-5 0 - 5 WBC/hpf   RBC / HPF 6-30 0 - 5 RBC/hpf   Bacteria, UA FEW (A) NONE SEEN   Crystals CA OXALATE CRYSTALS (A) NEGATIVE   Laboratory interpretation all normal except hematuria, calcium oxalate crystals consistent with known renal stones, positive UDS for benzos and marijuana    Imaging Review No results found.    Dg Abd 1 View  11/13/2015  CLINICAL DATA:  Abdominal pain. Right-sided. History kidney stones.  IMPRESSION: Bilateral nephrolithiasis. A 6 mm presumed right ureteric calculus could be new since the prior CT or have migrated minimally superiorly. Electronically Signed   By: Abigail Miyamoto M.D.   On: 11/13/2015 14:10   Ct Renal Stone Study  12/07/2015  CLINICAL DATA:  Right flank pain started today. History of renal calculi. . IMPRESSION: 1. Bilateral nonobstructive nephrolithiasis. An 8 mm calculus is medially located in the lower pole collecting system but not currently obstructing the collecting system or UPJ. 2. Left adrenal adenoma. 3. Small supraumbilical herniation of omental adipose tissue. Electronically Signed   By: Van Clines M.D.   On: 12/07/2015 12:42    I have personally reviewed and evaluated these images and lab results as part of my medical decision-making.    MDM   Final diagnoses:  Rt flank pain   New Prescriptions   CYCLOBENZAPRINE (FLEXERIL) 10 MG TABLET    Take 1 tablet (10 mg total) by mouth 3 (three) times daily as needed (muscle soreness).   NAPROXEN (NAPROSYN) 500 MG TABLET    Take 1 po BID with food prn pain   ONDANSETRON (ZOFRAN) 4 MG TABLET    Take 1 tablet (4 mg total) by mouth every 8 (eight) hours as needed for nausea or vomiting.   TRAMADOL (ULTRAM) 50 MG TABLET    Take 2 tablets (100 mg total) by mouth every 6 (six) hours as needed.    Plan discharge  Rolland Porter, MD, FACEP   I personally performed the services described in this documentation, which was scribed in my presence.  The recorded information has been reviewed and considered.  Rolland Porter, MD, Barbette Or, MD 12/13/15 [phone]

## 2015-12-13 NOTE — ED Notes (Signed)
Pt fell asleep while giving her pain medication

## 2015-12-13 NOTE — ED Notes (Signed)
Pt states understanding of care given and follow up instructions.  Ambulated from ED.  Instructed to keep appointment with urology

## 2015-12-13 NOTE — ED Notes (Signed)
Pt was seen here last week and diagnosed with multiple kidney stones.  Has appointment with urology next week but having severe pain tonight with vomiting

## 2015-12-15 ENCOUNTER — Encounter: Payer: Self-pay | Admitting: Physical Therapy

## 2015-12-17 ENCOUNTER — Ambulatory Visit (INDEPENDENT_AMBULATORY_CARE_PROVIDER_SITE_OTHER): Payer: Self-pay | Admitting: Urology

## 2015-12-17 DIAGNOSIS — N200 Calculus of kidney: Secondary | ICD-10-CM

## 2015-12-20 ENCOUNTER — Other Ambulatory Visit: Payer: Self-pay | Admitting: Urology

## 2015-12-20 ENCOUNTER — Encounter: Payer: Self-pay | Admitting: Physical Therapy

## 2015-12-21 ENCOUNTER — Ambulatory Visit: Payer: Self-pay | Admitting: Sports Medicine

## 2015-12-22 ENCOUNTER — Encounter (HOSPITAL_COMMUNITY): Payer: Self-pay | Admitting: *Deleted

## 2015-12-22 NOTE — H&P (Signed)
Chief Complaint  My right side hurts   Active Problems  1. Nephrolithiasis (N20.0)  History of Present Illness   Michaela Aguilar is a 50 yo WF former patient of Dr. [place] Pink with a history of stones.  She has had ESWL x 3 with the last in 2007.   She was in the ER on 1/30 for right flank pain with nausea and vomiting.  The pain was severe.  The CT showed an 18mm right renal pelvic stone that is probably rolling in and out of the renal pelvis.  She has had some hematuria.   She has had no voiding complaints other than mild urethral irritation.   Past Medical History  1. History of Anxiety (F41.9)  2. History of arthritis (Z87.39)  3. History of depression (Z86.59)  Surgical History  1. History of Cesarean Section  2. History of Hernia Repair  3. History of Renal Lithotripsy  Current Meds  1. Ondansetron TBDP;  Therapy: (Recorded:03Feb2017) to Recorded  2. Oxycodone-Acetaminophen 5-325 MG Oral Tablet;  Therapy: (Recorded:03Feb2017) to Recorded  3. Tamsulosin HCl - 0.4 MG Oral Capsule;  Therapy: (Recorded:03Feb2017) to Recorded  4. Xanax TABS;  Therapy: (Recorded:03Feb2017) to Recorded  Allergies  1. No Known Drug Allergies  Family History  1. No pertinent family history  Social History   Denied: History of Alcohol use   Caffeine use (F15.90)   Number of children   1 son 1 daughter   Single   Tobacco use (Z72.0)   1 black and mild for 15 yrs   Unemployed (Z56.0)  Review of Systems Genitourinary, constitutional, skin, eye, otolaryngeal, hematologic/lymphatic, cardiovascular, pulmonary, endocrine, musculoskeletal, gastrointestinal, neurological and psychiatric system(s) were reviewed and pertinent findings if present are noted and are otherwise negative.  Genitourinary: urinary frequency, nocturia, incontinence and hematuria.  Endocrine: polydipsia.  Musculoskeletal: back pain and joint pain.  Neurological: dizziness and headache.  Psychiatric: depression and anxiety.     Vitals Vital Signs [Data Includes: Last 1 Day]  Recorded: 03Feb2017 09:59AM  Height: 5 ft 5 in Weight: 232 lb  BMI Calculated: 38.61 BSA Calculated: 2.11 Blood Pressure: 133 / 83 Temperature: 98.7 F Heart Rate: 80  Physical Exam Constitutional: Well nourished and well developed . No acute distress.  ENT:. The ears and nose are normal in appearance.  Neck: The appearance of the neck is normal and no neck mass is present.  Pulmonary: No respiratory distress and normal respiratory rhythm and effort.  Cardiovascular: Heart rate and rhythm are normal . No peripheral edema.  Abdomen: The abdomen is obese. The abdomen is soft and nontender. No masses are palpated. mild right CVA tenderness. No hernias are palpable. No hepatosplenomegaly noted.  Lymphatics: The posterior cervical and supraclavicular nodes are not enlarged or tender.  Skin: Normal skin turgor, no visible rash and no visible skin lesions.  Neuro/Psych:. Mood and affect are appropriate. Normal sensation of the perineum/perianal region (S3,4,5).    Results/Data Urine [Data Includes: Last 1 Day]   [id number]  COLOR YELLOW   APPEARANCE CLEAR   SPECIFIC GRAVITY 1.010   pH 7.0   GLUCOSE NEGATIVE   BILIRUBIN NEGATIVE   KETONE NEGATIVE   BLOOD TRACE   PROTEIN NEGATIVE   NITRITE NEGATIVE   LEUKOCYTE ESTERASE NEGATIVE   SQUAMOUS EPITHELIAL/HPF 0-5 HPF  WBC 0-5 WBC/HPF  RBC 0-2 RBC/HPF  BACTERIA NONE SEEN HPF  CRYSTALS See Below HPF  CASTS NONE SEEN LPF  Yeast NONE SEEN HPF   Old records or history reviewed: ER records  reviewed.  The following images/tracing/specimen were independently visualized:  CT reviewed.  The following clinical lab reports were reviewed:  UA and ER labs reviewed.    Assessment  1. Nephrolithiasis (N20.0)   She has an 55mm right renal pelvic stone that is causing intermittent pain.   Plan Nephrolithiasis   1. Start: Oxycodone-Acetaminophen 5-325 MG Oral Tablet; take 1 or 2 tablets q 4-6  hours  prn pain  2. Start: Promethazine HCl - 25 MG Oral Tablet; TAKE 1 TABLET Every  6 hours PRN  3. Start: Tamsulosin HCl - 0.4 MG Oral Capsule; TAKE 1 CAPSULE Daily  4. Follow-up Schedule Surgery Office  Follow-up  Status: Hold For - Appointment   Requested for: [phone]  5. UA With REFLEX; [Do Not Release]; Status:Hold For - [employer];  Requested for:01Feb2017;     I discussed the treatment options including ureteroscopy and ESWL and she would like to proceed with ESWL.  I reviewed the risks of bleeding, infection, injury to the kidney or adjacent organs, obstructing fragments needing secondary procedures, thrombotic events and sedation risks.  I have refilled her meds.   Signatures Electronically signed by : Irine Seal, M.D.; Dec 17 2015 10:51AM EST

## 2015-12-23 ENCOUNTER — Encounter (HOSPITAL_COMMUNITY): Payer: Self-pay | Admitting: General Practice

## 2015-12-23 ENCOUNTER — Ambulatory Visit (HOSPITAL_COMMUNITY): Payer: Self-pay

## 2015-12-23 ENCOUNTER — Ambulatory Visit (HOSPITAL_COMMUNITY)
Admission: RE | Admit: 2015-12-23 | Discharge: 2015-12-23 | Disposition: A | Discharge status: Home / Self Care | Payer: Self-pay | Source: Ambulatory Visit | Attending: Urology | Admitting: Urology

## 2015-12-23 ENCOUNTER — Encounter (HOSPITAL_COMMUNITY)
Admission: RE | Disposition: A | Discharge status: Home / Self Care | Payer: Self-pay | Source: Ambulatory Visit | Attending: Urology

## 2015-12-23 DIAGNOSIS — N200 Calculus of kidney: Secondary | ICD-10-CM | POA: Insufficient documentation

## 2015-12-23 DIAGNOSIS — F17290 Nicotine dependence, other tobacco product, uncomplicated: Secondary | ICD-10-CM | POA: Insufficient documentation

## 2015-12-23 DIAGNOSIS — F419 Anxiety disorder, unspecified: Secondary | ICD-10-CM | POA: Insufficient documentation

## 2015-12-23 DIAGNOSIS — F329 Major depressive disorder, single episode, unspecified: Secondary | ICD-10-CM | POA: Insufficient documentation

## 2015-12-23 DIAGNOSIS — Z79899 Other long term (current) drug therapy: Secondary | ICD-10-CM | POA: Insufficient documentation

## 2015-12-23 DIAGNOSIS — M1990 Unspecified osteoarthritis, unspecified site: Secondary | ICD-10-CM | POA: Insufficient documentation

## 2015-12-23 HISTORY — DX: Anxiety disorder, unspecified: F41.9

## 2015-12-23 HISTORY — DX: Major depressive disorder, single episode, unspecified: F32.9

## 2015-12-23 HISTORY — DX: Depression, unspecified: F32.A

## 2015-12-23 SURGERY — LITHOTRIPSY, ESWL
Anesthesia: LOCAL | Laterality: Right

## 2015-12-23 MED ORDER — CIPROFLOXACIN HCL 500 MG PO TABS
500.0000 mg | ORAL_TABLET | ORAL | Status: AC
  Administered 2015-12-23: 500 mg via ORAL
  Filled 2015-12-23: qty 1

## 2015-12-23 MED ORDER — DIPHENHYDRAMINE HCL 25 MG PO CAPS
25.0000 mg | ORAL_CAPSULE | ORAL | Status: AC
  Administered 2015-12-23: 25 mg via ORAL
  Filled 2015-12-23: qty 1

## 2015-12-23 MED ORDER — SODIUM CHLORIDE 0.9 % IV SOLN
INTRAVENOUS | Status: DC
  Administered 2015-12-23: 10:00:00 via INTRAVENOUS

## 2015-12-23 MED ORDER — DIAZEPAM 5 MG PO TABS
10.0000 mg | ORAL_TABLET | ORAL | Status: AC
  Administered 2015-12-23: 10 mg via ORAL
  Filled 2015-12-23: qty 2

## 2015-12-27 ENCOUNTER — Ambulatory Visit (INDEPENDENT_AMBULATORY_CARE_PROVIDER_SITE_OTHER): Payer: Self-pay | Admitting: Physical Therapy

## 2015-12-27 DIAGNOSIS — M2560 Stiffness of unspecified joint, not elsewhere classified: Secondary | ICD-10-CM

## 2015-12-27 DIAGNOSIS — R29898 Other symptoms and signs involving the musculoskeletal system: Secondary | ICD-10-CM

## 2015-12-27 DIAGNOSIS — R293 Abnormal posture: Secondary | ICD-10-CM

## 2015-12-27 DIAGNOSIS — Z7409 Other reduced mobility: Secondary | ICD-10-CM

## 2015-12-27 DIAGNOSIS — M623 Immobility syndrome (paraplegic): Secondary | ICD-10-CM

## 2015-12-27 DIAGNOSIS — R531 Weakness: Secondary | ICD-10-CM

## 2015-12-27 NOTE — Therapy (Signed)
[place], [place], [address] Phone: [phone]   Fax:  [phone]  Physical Therapy Treatment  Patient Details  Name: Michaela Aguilar MRN: [id number] Date of Birth: 10-Jun-1966 Referring Provider: Dr. Lilia Argue   Encounter Date: 12/27/2015      PT End of Session - 12/27/15 1148    Visit Number 5   Number of Visits 12   Date for PT Re-Evaluation 01/10/16   PT Start Time [id number]   PT Stop Time [id number]   PT Time Calculation (min) 59 min      Past Medical History  Diagnosis Date  . Bronchitis   . Kidney stones per pt.  . Depression   . Anxiety     Past Surgical History  Procedure Laterality Date  . Cesarean section    . Hernia repair    . Extracorporeal shock wave lithotripsy  1995 2010    There were no vitals filed for this visit.  Visit Diagnosis:  Abnormal posture  Weakness of both lower extremities  Stiffness due to immobility  Decreased strength, endurance, and mobility      Subjective Assessment - 12/27/15 1150    Subjective Pt reports she had surgery last week for kidney stone.  Her neck (up into head) has been bothering her more than leg; feels the pain medication she is taking for kidney discomfort has lessened her other pains.    Currently in Pain? Yes   Pain Score 5    Pain Location Neck   Pain Descriptors / Indicators Constant   Aggravating Factors  reaching    Pain Relieving Factors medicine, heat             [facility] PT Assessment - 12/27/15 0001    Assessment   Medical Diagnosis Lt LB and LE pain    Referring Provider Dr. Lilia Argue    Onset Date/Surgical Date 05/19/15   Hand Dominance Right   Next MD Visit 01/10/16   ROM / Strength   AROM / PROM / Strength AROM;Strength   AROM   AROM Assessment Site Cervical, supine   Cervical - Right Rotation 70   Cervical - Left Rotation 65  first trial 50, then 60 deg, then 65 (AROM)                      [facility] Adult PT Treatment/Exercise - 12/27/15 0001    Lumbar Exercises: Stretches   Passive Hamstring Stretch 3 reps;30 seconds   Lower Trunk Rotation --  10 reps each side; 3 sec hold- to tolerance   Quad Stretch 3 reps;30 seconds   ITB Stretch 2 reps;30 seconds  with strap supine   Piriformis Stretch 30 seconds;2 reps  each side   Lumbar Exercises: Aerobic   Stationary Bike NuStep L4: 6 min legs/arms   Lumbar Exercises: Sidelying   Clam 10 reps  2 sets each side   Lumbar Exercises: Prone   Single Arm Raise Right;Left;10 reps;1 second   Straight Leg Raise 10 reps   Modalities   Modalities Electrical Stimulation;Moist Heat   Moist Heat Therapy   Number Minutes Moist Heat 15 Minutes   Moist Heat Location Cervical;Hip   Electrical Stimulation   Electrical Stimulation Location cervical paraspinals / upper trap    Electrical Stimulation Action IFC   Electrical Stimulation Parameters to tolerance    Electrical Stimulation Goals Pain   Manual Therapy   Manual Therapy Soft tissue mobilization;Myofascial release  Soft tissue mobilization to bilat temporalis, masseter, occipitalis, cervical paraspinals and upper trap    Myofascial Release suboccipital release.                      PT Long Term Goals - 12/08/15 1213    PT LONG TERM GOAL #1   Title Improve posture and alignment in standing and with gait 01/10/16   Time 2   Period Weeks   Status On-going   PT LONG TERM GOAL #2   Title Increase strength bilat LE's by 1/2 to 1 mm grade 01/10/16   Time 6   Period Weeks   Status On-going   PT LONG TERM GOAL #3   Title Improve standing and walking tolerance to 15-20 min 01/10/16   Time 6   Period Weeks   Status On-going   PT LONG TERM GOAL #4   Title I in HEP 01/10/16   Time 6   Period Weeks   Status On-going   PT LONG TERM GOAL #5   Title Improve FOTO to </= 44% limitation 01/10/16   Time 6   Period Weeks   Status On-going                Plan - 12/27/15 1232    Clinical Impression Statement Pt reporting decreased overall pain, however she has been on pain medicine for kidney stone procedure for last 3 days. Pt demonstrated improved cervical rotation.  Making gradual progress towards goals.    Pt will benefit from skilled therapeutic intervention in order to improve on the following deficits Postural dysfunction;Improper body mechanics;Pain;Decreased range of motion;Decreased strength;Increased fascial restricitons;Decreased endurance;Decreased activity tolerance;Abnormal gait   Rehab Potential Good   PT Frequency 2x / week   PT Duration 6 weeks   PT Treatment/Interventions Patient/family education;ADLs/Self Care Home Management;Therapeutic exercise;Therapeutic activities;Neuromuscular re-education;Balance training;Manual techniques;Dry needling;Cryotherapy;Electrical Stimulation;Moist Heat;Ultrasound   PT Next Visit Plan progress with ROM and strengthening; modalities and manual work as indicated - trial of mobs; trial of TDN as schedule allows. Assess LLE strength next visit.         Problem List Patient Active Problem List   Diagnosis Date Noted  . Neck pain 11/17/2015  . Low back pain 11/17/2015    [name], PTA 12/27/2015 12:40 PM  [facility] [place], [place], [address] Phone: [phone]   Fax:  [phone]  Name: Michaela Aguilar MRN: [id number] Date of Birth: 1966-10-21

## 2015-12-29 ENCOUNTER — Ambulatory Visit (INDEPENDENT_AMBULATORY_CARE_PROVIDER_SITE_OTHER): Payer: Self-pay | Admitting: Physical Therapy

## 2015-12-29 DIAGNOSIS — M2560 Stiffness of unspecified joint, not elsewhere classified: Secondary | ICD-10-CM

## 2015-12-29 DIAGNOSIS — R293 Abnormal posture: Secondary | ICD-10-CM

## 2015-12-29 DIAGNOSIS — Z7409 Other reduced mobility: Secondary | ICD-10-CM

## 2015-12-29 DIAGNOSIS — M623 Immobility syndrome (paraplegic): Secondary | ICD-10-CM

## 2015-12-29 DIAGNOSIS — R6889 Other general symptoms and signs: Secondary | ICD-10-CM

## 2015-12-29 DIAGNOSIS — R531 Weakness: Secondary | ICD-10-CM

## 2015-12-29 DIAGNOSIS — R29898 Other symptoms and signs involving the musculoskeletal system: Secondary | ICD-10-CM

## 2015-12-29 NOTE — Therapy (Signed)
[place], [place], [address] Phone: [phone]   Fax:  [phone]  Physical Therapy Treatment  Patient Details  Name: Michaela Aguilar MRN: [id number] Date of Birth: 02-23-1966 Referring Provider: Dr. Lilia Argue  Encounter Date: 12/29/2015      PT End of Session - 12/29/15 1022    Visit Number 6   Number of Visits 12   Date for PT Re-Evaluation 01/10/16   PT Start Time 0931   PT Stop Time 1030   PT Time Calculation (min) 59 min      Past Medical History  Diagnosis Date  . Bronchitis   . Kidney stones per pt.  . Depression   . Anxiety     Past Surgical History  Procedure Laterality Date  . Cesarean section    . Hernia repair    . Extracorporeal shock wave lithotripsy  1995 2010    There were no vitals filed for this visit.  Visit Diagnosis:  Abnormal posture  Weakness of both lower extremities  Stiffness due to immobility  Decreased strength, endurance, and mobility      Subjective Assessment - 12/29/15 0946    Subjective Pt reports she discontinued the pain medicine for her kidney stone.  At this point she is only performing quad stretch and LTR for HEP.     Currently in Pain? Yes   Pain Score 7    Pain Location Neck   Pain Orientation Right   Pain Descriptors / Indicators Constant   Aggravating Factors  reaching    Pain Relieving Factors medicine    Pain Score 5   Pain Location Leg   Pain Orientation Left   Pain Descriptors / Indicators Nagging   Aggravating Factors  prolonged walking    Pain Relieving Factors heat            [facility] PT Assessment - 12/29/15 0001    Assessment   Medical Diagnosis Lt LB and LE pain    Referring Provider Dr. Lilia Argue   Onset Date/Surgical Date 05/19/15   Hand Dominance Right   Next MD Visit 01/10/16           [facility] Adult PT Treatment/Exercise - 12/29/15 0001    Neck Exercises: Supine   Shoulder Flexion  Right;Left;10 reps   Other Supine Exercise prolonged stretch with LUE abd to 90 deg.    Neck Exercises: Prone   Other Prone Exercise POE with head flexion to neutral and cervical diagonals x 5 each direction    Lumbar Exercises: Stretches   Passive Hamstring Stretch 3 reps;30 seconds   Lower Trunk Rotation --  10 reps each side; 3 sec hold- to tolerance   Lumbar Exercises: Aerobic   Stationary Bike NuStep L4: 6 min legs/arms   Lumbar Exercises: Sidelying   Clam 15 reps  each side   Lumbar Exercises: Prone   Opposite Arm/Leg Raise 5 reps;Right arm/Left leg;Left arm/Right leg  challenging   Modalities   Modalities Electrical Stimulation;Moist Heat   Moist Heat Therapy   Number Minutes Moist Heat 15 Minutes   Moist Heat Location Cervical;Lumbar Spine   Electrical Stimulation   Electrical Stimulation Location cervical paraspinals/ upper trap    Electrical Stimulation Action IFC   Electrical Stimulation Parameters to tolerance    Electrical Stimulation Goals Pain   Manual Therapy   Manual Therapy Myofascial release;Soft tissue mobilization   Soft tissue mobilization to bilat cervical paraspinals    Myofascial Release suboccipital  release, Lt pec release, MFR to Lt platysma, SCM.                      PT Long Term Goals - 12/08/15 1213    PT LONG TERM GOAL #1   Title Improve posture and alignment in standing and with gait 01/10/16   Time 2   Period Weeks   Status On-going   PT LONG TERM GOAL #2   Title Increase strength bilat LE's by 1/2 to 1 mm grade 01/10/16   Time 6   Period Weeks   Status On-going   PT LONG TERM GOAL #3   Title Improve standing and walking tolerance to 15-20 min 01/10/16   Time 6   Period Weeks   Status On-going   PT LONG TERM GOAL #4   Title I in HEP 01/10/16   Time 6   Period Weeks   Status On-going   PT LONG TERM GOAL #5   Title Improve FOTO to </= 44% limitation 01/10/16   Time 6   Period Weeks   Status On-going                Plan - 12/29/15 1018    Clinical Impression Statement Pt overall moving better, with improved neck ROM.  Pt tolerated exercises with minimal increase in pain.  Pt encouraged to avoid postures that contribute to neck tightness and pain and to be more compliant with HEP in order to progress towards established goals.    Pt will benefit from skilled therapeutic intervention in order to improve on the following deficits Postural dysfunction;Improper body mechanics;Pain;Decreased range of motion;Decreased strength;Increased fascial restricitons;Decreased endurance;Decreased activity tolerance;Abnormal gait   Rehab Potential Good   PT Frequency 2x / week   PT Duration 6 weeks   PT Treatment/Interventions Patient/family education;ADLs/Self Care Home Management;Therapeutic exercise;Therapeutic activities;Neuromuscular re-education;Balance training;Manual techniques;Dry needling;Cryotherapy;Electrical Stimulation;Moist Heat;Ultrasound   PT Next Visit Plan progress with ROM and strengthening; modalities and manual work as indicated. Assess LLE strength next visit.         Problem List Patient Active Problem List   Diagnosis Date Noted  . Neck pain 11/17/2015  . Low back pain 11/17/2015    [name], PTA 12/29/2015 10:58 AM  [facility] [place], [place], [address] Phone: [phone]   Fax:  [phone]  Name: Michaela Aguilar MRN: [id number] Date of Birth: 01-16-1966

## 2016-01-03 ENCOUNTER — Encounter: Payer: Self-pay | Admitting: Physical Therapy

## 2016-01-05 ENCOUNTER — Ambulatory Visit (INDEPENDENT_AMBULATORY_CARE_PROVIDER_SITE_OTHER): Payer: Self-pay | Admitting: Physical Therapy

## 2016-01-05 DIAGNOSIS — M623 Immobility syndrome (paraplegic): Secondary | ICD-10-CM

## 2016-01-05 DIAGNOSIS — R293 Abnormal posture: Secondary | ICD-10-CM

## 2016-01-05 DIAGNOSIS — Z7409 Other reduced mobility: Secondary | ICD-10-CM

## 2016-01-05 DIAGNOSIS — R29898 Other symptoms and signs involving the musculoskeletal system: Secondary | ICD-10-CM

## 2016-01-05 DIAGNOSIS — M2560 Stiffness of unspecified joint, not elsewhere classified: Secondary | ICD-10-CM

## 2016-01-05 DIAGNOSIS — R531 Weakness: Secondary | ICD-10-CM

## 2016-01-05 NOTE — Patient Instructions (Signed)
Over Head Pull: Narrow Grip     [place] [phone]   On back, knees bent, feet flat, band across thighs, elbows straight but relaxed. Pull hands apart (start). Keeping elbows straight, bring arms up and over head, hands toward floor. Keep pull steady on band. Hold momentarily. Return slowly, keeping pull steady, back to start. Repeat __10_ times. Band color ___yellow___   Side Pull: Double Arm   On back, knees bent, feet flat. Arms perpendicular to body, shoulder level, elbows straight but relaxed. Pull arms out to sides, elbows straight. Resistance band comes across collarbones, hands toward floor. Hold momentarily. Slowly return to starting position. Repeat _10__ times, 2-3 sets. Band color ___yellow__   Sash   On back, knees bent, feet flat, left hand on left hip, right hand above left. Pull right arm DIAGONALLY (hip to shoulder) across chest. Bring right arm along head toward floor. Hold momentarily. Slowly return to starting position.  Thumb up Repeat _10__ times. Do with left arm. Band color _yellow ____   Shoulder Rotation: Double Arm   On back, knees bent, feet flat, elbows tucked at sides, bent 90, hands palms up. Pull hands apart and down toward floor, keeping elbows near sides. Hold momentarily. Slowly return to starting position. Repeat _10__ times, 2-3 sets. Band color ___yellow ___

## 2016-01-05 NOTE — Therapy (Signed)
[place], [place], [address] Phone: [phone]   Fax:  [phone]  Physical Therapy Treatment  Patient Details  Name: Michaela Aguilar MRN: [id number] Date of Birth: 08/31/1966 Referring Provider: Dr. Lilia Argue   Encounter Date: 01/05/2016      PT End of Session - 01/05/16 0938    Visit Number 7   Number of Visits 12   Date for PT Re-Evaluation 01/10/16   PT Start Time 0930   PT Stop Time 1037   PT Time Calculation (min) 67 min      Past Medical History  Diagnosis Date  . Bronchitis   . Kidney stones per pt.  . Depression   . Anxiety     Past Surgical History  Procedure Laterality Date  . Cesarean section    . Hernia repair    . Extracorporeal shock wave lithotripsy  1995 2010    There were no vitals filed for this visit.  Visit Diagnosis:  Abnormal posture  Weakness of both lower extremities  Stiffness due to immobility  Decreased strength, endurance, and mobility      Subjective Assessment - 01/05/16 0938    Subjective Pt reports her neck pain has increased; usually worse at night. Pt has been doing AROM shoulder flexion in supine, seated hamstring stretch and LTR for HEP.    Currently in Pain? Yes   Pain Score 7    Pain Location Neck   Pain Orientation Left   Pain Descriptors / Indicators Aching;Tightness   Aggravating Factors  reaching    Pain Relieving Factors heat    Pain Score 5   Pain Location Buttocks   Pain Orientation Left   Pain Descriptors / Indicators Constant            [facility] PT Assessment - 01/05/16 0001    Assessment   Medical Diagnosis Lt LB and LE pain    Referring Provider Dr. Lilia Argue    Onset Date/Surgical Date 05/19/15   Hand Dominance Right   Next MD Visit 01/10/16   ROM / Strength   AROM / PROM / Strength AROM;Strength   AROM   Overall AROM Comments Lt shoulder flexion in supine to 130 deg due to pain   Cervical - Right  Side Bend  37   Cervical - Left Side Bend 35   Cervical - Right Rotation WNL   Cervical - Left Rotation WNL   Lumbar Flexion Able to touch shins (75%)   Lumbar Extension WNL    Lumbar - Right Side Bend WNL   Lumbar - Left Side Bend WNL   Lumbar - Right Rotation 50%   Lumbar - Left Rotation 50%                     [facility] Adult PT Treatment/Exercise - 01/05/16 0001    Exercises   Exercises Knee/Hip   Neck Exercises: Standing   Other Standing Exercises bilateral shoulder row with yellow band with scap squeeze at noodle x 10 reps x 2 sets. Then bilat shoulder ER with yellow band x 10 reps x 2 sets. VC for neutral head position   Neck Exercises: Supine   Shoulder Flexion Right;Left;15 reps  with yellow band    Upper Extremity D2 Flexion;15 reps  first 8 reps just AAROM   Theraband Level (UE D2) Level 1 (Yellow)   Other Supine Exercise Horizontal Abd with yellow band x 10 reps x 2 sets.  Lumbar Exercises: Stretches   Passive Hamstring Stretch 3 reps;30 seconds   Piriformis Stretch 30 seconds;2 reps  each side   Lumbar Exercises: Aerobic   Stationary Bike NuStep L4: 8 min legs/arms   Knee/Hip Exercises: Stretches   Press photographer Right;Left;3 reps  15 sec    Modalities   Modalities Electrical Stimulation;Moist Heat   Moist Heat Therapy   Moist Heat Location Cervical;Lumbar Spine   Acupuncturist Location Lt cervical paraspinals/ upper trap    Electrical Stimulation Action IFC   Electrical Stimulation Parameters to tolerance    Electrical Stimulation Goals Pain   Manual Therapy   Manual Therapy Myofascial release;Soft tissue mobilization   Soft tissue mobilization to bilat cervical paraspinals and upper trap    Myofascial Release suboccipital release.                 PT Education - 01/05/16 1034    Education provided Yes   Education Details supine scap stabilization HEP    Person(s) Educated Patient   Methods  Explanation;Handout   Comprehension Verbalized understanding;Returned demonstration             PT Long Term Goals - 01/05/16 1109    PT LONG TERM GOAL #1   Title Improve posture and alignment in standing and with gait 01/10/16   Time 2   Period Weeks   Status Achieved   PT LONG TERM GOAL #2   Title Increase strength bilat LE's by 1/2 to 1 mm grade 01/10/16   Time 6   Period Weeks   Status On-going   PT LONG TERM GOAL #3   Title Improve standing and walking tolerance to 15-20 min 01/10/16   Time 6   Period Weeks   Status On-going   PT LONG TERM GOAL #4   Title I in HEP 01/10/16   Time 6   Period Weeks   Status On-going   PT LONG TERM GOAL #5   Title Improve FOTO to </= 44% limitation 01/10/16   Time 6   Period Weeks   Status On-going               Plan - 01/05/16 1111    Clinical Impression Statement Pt demonstrated improved lumbar ROM as well as exercise tolerance. Pt able to switch positions between exercises with greater ease than when therapy first began.   Pt tolerated new scap stabilization/ posture exercises without increase in pain.  Pt has met LTG #1 with improved posture and gait, and is making good gains towards remaining goals.    Pt will benefit from skilled therapeutic intervention in order to improve on the following deficits Postural dysfunction;Improper body mechanics;Pain;Decreased range of motion;Decreased strength;Increased fascial restricitons;Decreased endurance;Decreased activity tolerance;Abnormal gait   Rehab Potential Good   PT Frequency 2x / week   PT Duration 6 weeks   PT Treatment/Interventions Patient/family education;ADLs/Self Care Home Management;Therapeutic exercise;Therapeutic activities;Neuromuscular re-education;Balance training;Manual techniques;Dry needling;Cryotherapy;Electrical Stimulation;Moist Heat;Ultrasound   PT Next Visit Plan Assess LE strength.  Assess goals and need for further therapy (end of POC 2/27) vs d/c to HEP.  Progress HEP as tolerated.    PT Home Exercise Plan HEP; instructed in use of friend's TENS unit for pain management   Consulted and Agree with Plan of Care Patient        Problem List Patient Active Problem List   Diagnosis Date Noted  . Neck pain 11/17/2015  . Low back pain 11/17/2015    [name],  PTA 01/05/2016 12:36 PM  [place], [place], [address] Phone: [phone]   Fax:  [phone]  Name: Michaela Aguilar MRN: [id number] Date of Birth: 21-Jan-1966

## 2016-01-10 ENCOUNTER — Encounter: Payer: Self-pay | Admitting: Rehabilitative and Restorative Service Providers"

## 2016-01-10 ENCOUNTER — Ambulatory Visit (INDEPENDENT_AMBULATORY_CARE_PROVIDER_SITE_OTHER): Payer: Self-pay | Admitting: Rehabilitative and Restorative Service Providers"

## 2016-01-10 ENCOUNTER — Ambulatory Visit: Payer: Self-pay | Admitting: Sports Medicine

## 2016-01-10 DIAGNOSIS — M623 Immobility syndrome (paraplegic): Secondary | ICD-10-CM

## 2016-01-10 DIAGNOSIS — Z7409 Other reduced mobility: Secondary | ICD-10-CM

## 2016-01-10 DIAGNOSIS — M2560 Stiffness of unspecified joint, not elsewhere classified: Secondary | ICD-10-CM

## 2016-01-10 DIAGNOSIS — R6889 Other general symptoms and signs: Secondary | ICD-10-CM

## 2016-01-10 DIAGNOSIS — R29898 Other symptoms and signs involving the musculoskeletal system: Secondary | ICD-10-CM

## 2016-01-10 DIAGNOSIS — R531 Weakness: Secondary | ICD-10-CM

## 2016-01-10 DIAGNOSIS — R293 Abnormal posture: Secondary | ICD-10-CM

## 2016-01-10 NOTE — Patient Instructions (Signed)
Bridging    Slowly raise buttocks from floor, keeping stomach tight. Hold 3-5 seconds Repeat _10___ times per set. Do __1-3__ sets per session. Do __2__ sessions per day.   Strengthening: Hip Adduction - Isometric    With ball or folded pillow between knees, squeeze knees together. Hold _3-5___ seconds. Repeat __10__ times per set. Do __1-3__ sets per session. Do __2__ sessions per day.

## 2016-01-10 NOTE — Therapy (Signed)
[place], [place], [address] Phone: [phone]   Fax:  [phone]  Physical Therapy Treatment  Patient Details  Name: Michaela Aguilar MRN: [id number] Date of Birth: 09-21-1966 Referring Provider: Dr. Lilia Argue   Encounter Date: 01/10/2016      PT End of Session - 01/10/16 1024    Visit Number 8   Number of Visits 12   Date for PT Re-Evaluation 01/10/16   PT Start Time 1016   PT Stop Time 1107   PT Time Calculation (min) 51 min   Activity Tolerance Patient limited by pain;Patient tolerated treatment well      Past Medical History  Diagnosis Date  . Bronchitis   . Kidney stones per pt.  . Depression   . Anxiety     Past Surgical History  Procedure Laterality Date  . Cesarean section    . Hernia repair    . Extracorporeal shock wave lithotripsy  1995 2010    There were no vitals filed for this visit.  Visit Diagnosis:  Abnormal posture - Plan: PT plan of care cert/re-cert  Weakness of both lower extremities - Plan: PT plan of care cert/re-cert  Stiffness due to immobility - Plan: PT plan of care cert/re-cert  Decreased strength, endurance, and mobility - Plan: PT plan of care cert/re-cert          [facility] PT Assessment - 01/10/16 0001    Assessment   Medical Diagnosis Lt LB and LE pain    Referring Provider Dr. Lilia Argue    Onset Date/Surgical Date 05/19/15   Hand Dominance Right   Next MD Visit 01/17/16   Observation/Other Assessments   Focus on Therapeutic Outcomes (FOTO)  63% limitation    AROM   Cervical Flexion 46   Cervical Extension 32   Cervical - Right Side Bend 32   Cervical - Left Side Bend 37   Cervical - Right Rotation 68   Cervical - Left Rotation 64   Lumbar Flexion 80%   Lumbar Extension 20%   Lumbar - Right Side Bend 75%   Lumbar - Left Side Bend 75%   Lumbar - Right Rotation 55%   Lumbar - Left Rotation 55%   Strength   Overall Strength  Comments LE strength asses - patient unable to give maximum effort for testing due to pain in LT LE - muscle strength testing less accurate    Right/Left Hip --  Flex/ext/ab/add Rt 4+/5 to 5-/5; Lt 4-/5 to 4/5    Right/Left Knee --  flex/ext Rt 5/5; Lt 5-/5   Right/Left Ankle --  DF Rt 5/5; Lt 5-/5    Palpation   Spinal mobility pain with CPA/UPA Lt > Rt entire lumbar spine    SI assessment  WFL   Palpation comment tightness/pain through the Lt QL/lats/lumbar paraspinals; Lt hip abductors     Ambulation/Gait   Gait Comments antalgic gait limp on Lt LE with Lt LE ER/toe out                      [facility] Adult PT Treatment/Exercise - 01/10/16 0001    Exercises   Exercises Knee/Hip   Neck Exercises: Supine   Shoulder Flexion Right;Left;10 reps  with yellow band    Upper Extremity D2 Flexion;15 reps  first 8 reps just AAROM   Theraband Level (UE D2) Level 1 (Yellow)   Other Supine Exercise Horizontal Abd with yellow band x 10  reps x 2 sets.    Lumbar Exercises: Stretches   Passive Hamstring Stretch 3 reps;30 seconds   Lumbar Exercises: Aerobic   Stationary Bike NuStep L5: 6 min legs/arms   Lumbar Exercises: Supine   AB Set Limitations 3 part core 10 sec hold x 10    Knee/Hip Exercises: Stretches   Gastroc Stretch Right;Left;3 reps  15 sec    Knee/Hip Exercises: Supine   Hip Adduction Isometric Strengthening;1 set;10 reps  3 sec hold with ball    Bridges Strengthening;1 set;10 reps   Modalities   Modalities Electrical Stimulation;Moist Heat   Moist Heat Therapy   Number Minutes Moist Heat 15 Minutes   Moist Heat Location Cervical;Lumbar Spine   Electrical Stimulation   Electrical Stimulation Location x2; Lt hip x2   Electrical Stimulation Action IFC   Electrical Stimulation Parameters to tolerance   Electrical Stimulation Goals Pain                PT Education - 01/10/16 1057    Education provided Yes   Education Details HEP    Person(s) Educated  Patient   Methods Explanation;Demonstration;Tactile cues;Verbal cues;Handout   Comprehension Verbalized understanding;Returned demonstration;Verbal cues required;Tactile cues required             PT Long Term Goals - 01/10/16 1049    PT LONG TERM GOAL #1   Title Improve posture and alignment in standing and with gait 01/10/16   Time 2   Period Weeks   Status Achieved   PT LONG TERM GOAL #2   Title Increase strength bilat LE's by 1/2 to 1 mm grade 01/10/16   Time 6   Period Weeks   Status On-going   PT LONG TERM GOAL #3   Title Improve standing and walking tolerance to 15-20 min 01/10/16   Time 6   Period Weeks   Status On-going   PT LONG TERM GOAL #4   Title I in HEP 01/10/16   Time 6   Period Weeks   Status On-going   PT LONG TERM GOAL #5   Title Improve FOTO to </= 44% limitation 01/10/16   Time 6   Period Weeks   Status On-going               Plan - 01/10/16 1045    Clinical Impression Statement Patient demonstrated some improvement in ROM; no significant change in strength or pain with palpation. She continues to have pain on a daily basis with variable intensity. Patient feels she has improved with therapy. She has accomplished only one goal continues to be limited in functional activities. FOTO (functional outcome measure0 has improved only 2% to 63% limitaion. We wil continue with PT until she returns for follow up MD visit but anticipate d/c at that time.    Pt will benefit from skilled therapeutic intervention in order to improve on the following deficits Postural dysfunction;Improper body mechanics;Pain;Decreased range of motion;Decreased strength;Increased fascial restricitons;Decreased endurance;Decreased activity tolerance;Abnormal gait   Rehab Potential Good   PT Frequency 2x / week   PT Duration 6 weeks   PT Treatment/Interventions Patient/family education;ADLs/Self Care Home Management;Therapeutic exercise;Therapeutic activities;Neuromuscular  re-education;Balance training;Manual techniques;Dry needling;Cryotherapy;Electrical Stimulation;Moist Heat;Ultrasound   PT Next Visit Plan Progress with spinal stabilization and increase in activity level. Continue for 2-4 visits before return to MD for further assessment.    PT Home Exercise Plan HEP; continue use of friend's TENS unit for pain management   Consulted and Agree with Plan of Care Patient  Problem List Patient Active Problem List   Diagnosis Date Noted  . Neck pain 11/17/2015  . Low back pain 11/17/2015    Chealsey Miyamoto Nilda Simmer PT, MPH  01/10/2016, 1:28 PM  [facility] [place], [place], [address] Phone: [phone]   Fax:  [phone]  Name: BRINLEIGH THU MRN: [id number] Date of Birth: [birth year]

## 2016-01-12 ENCOUNTER — Ambulatory Visit (INDEPENDENT_AMBULATORY_CARE_PROVIDER_SITE_OTHER): Payer: Self-pay | Admitting: Physical Therapy

## 2016-01-12 DIAGNOSIS — M623 Immobility syndrome (paraplegic): Secondary | ICD-10-CM

## 2016-01-12 DIAGNOSIS — R531 Weakness: Secondary | ICD-10-CM

## 2016-01-12 DIAGNOSIS — M2560 Stiffness of unspecified joint, not elsewhere classified: Secondary | ICD-10-CM

## 2016-01-12 DIAGNOSIS — R6889 Other general symptoms and signs: Secondary | ICD-10-CM

## 2016-01-12 DIAGNOSIS — R29898 Other symptoms and signs involving the musculoskeletal system: Secondary | ICD-10-CM

## 2016-01-12 DIAGNOSIS — Z7409 Other reduced mobility: Secondary | ICD-10-CM

## 2016-01-12 DIAGNOSIS — R293 Abnormal posture: Secondary | ICD-10-CM

## 2016-01-12 NOTE — Patient Instructions (Signed)
  Abdominal Bracing With Pelvic Floor (Hook-Lying)   With neutral spine, tighten pelvic floor and abdominals. Hold 10 seconds. Repeat __10_ times. Do _1__ times a day.   Knee to Chest: Transverse Plane Stability   Bring one knee up, then return. Be sure pelvis does not roll side to side. Keep pelvis still. Lift knee __10_ times each leg. Restabilize pelvis. Repeat with other leg. Do _1-2__ sets, _1__ times per day.   Hip External Rotation With Pillow: Transverse Plane Stability   One knee bent, one leg straight, on pillow. Slowly roll bent knee out. Be sure pelvis does not rotate. Do _10__ times. Restabilize pelvis. Repeat with other leg. Do _1-2__ sets, _1__ times per day.  Heel Slide: 4-10 Inches - Transverse Plane Stability  Tighten stomach. Slide heel 4 inches down. Be sure pelvis does not rotate. Do _10__ times. Restabilize pelvis. Repeat with other leg. Do __1_ sets, _1__ times per day.   [facility] [place], [place] [address]  [phone] (office) [phone] (fax)

## 2016-01-12 NOTE — Therapy (Signed)
[place] [facility] [employer] [facility], [place], [address] Phone: [phone]   Fax:  [phone]  Physical Therapy Treatment  Patient Details  Name: Michaela Aguilar MRN: [id number] Date of Birth: [birth year] Referring Provider: Dr. Lilia Argue  Encounter Date: 01/12/2016      PT End of Session - 01/12/16 1031    Visit Number 9   Number of Visits 13   Date for PT Re-Evaluation 01/24/16   PT Start Time 1012   PT Stop Time 1116   PT Time Calculation (min) 64 min   Activity Tolerance Patient tolerated treatment well;Patient limited by pain      Past Medical History  Diagnosis Date  . Bronchitis   . Kidney stones per pt.  . Depression   . Anxiety     Past Surgical History  Procedure Laterality Date  . Cesarean section    . Hernia repair    . Extracorporeal shock wave lithotripsy  1995 2010    There were no vitals filed for this visit.  Visit Diagnosis:  Abnormal posture  Weakness of both lower extremities  Stiffness due to immobility  Decreased strength, endurance, and mobility      Subjective Assessment - 01/12/16 1031    Subjective Pt reports her neck pain has remained    Currently in Pain? Yes   Pain Score 5    Pain Location Neck   Pain Orientation Left   Pain Descriptors / Indicators Aching   Aggravating Factors  reaching    Pain Relieving Factors heat    Multiple Pain Sites Yes   Pain Score 3   Pain Location Buttocks   Pain Orientation Left   Pain Descriptors / Indicators Constant;Aching   Aggravating Factors  prolonged walking   Pain Relieving Factors heat             [facility] PT Assessment - 01/12/16 0001    Assessment   Medical Diagnosis Lt LB and LE pain    Referring Provider Dr. Lilia Argue   Onset Date/Surgical Date 05/19/15   Hand Dominance Right   Next MD Visit 01/17/16          [facility] Adult PT Treatment/Exercise - 01/12/16 0001    Neck Exercises: Supine   Cervical Rotation  5 reps;Right;Left   Shoulder Flexion Left;5 reps  AAROM    Upper Extremity D2 Flexion;10 reps;Theraband  each arm   Theraband Level (UE D2) Level 2 (Red)   Other Supine Exercise Deep neck flexors- head lift and controlled descent  x10   Other Supine Exercise Horizontal shoulder with red band x 10    Lumbar Exercises: Stretches   Passive Hamstring Stretch 3 reps;30 seconds   Lower Trunk Rotation --  10 reps each side; 3 sec hold- to tolerance   Quad Stretch 3 reps;30 seconds   ITB Stretch 1 rep;30 seconds   Lumbar Exercises: Aerobic   Stationary Bike NuStep L4: 6 min legs/arms   Lumbar Exercises: Supine   Ab Set 5 seconds;10 reps   Clam 10 reps  each side with ab set   Heel Slides 5 reps  each leg with ab set   Bent Knee Raise 10 reps  with ab set   Bridge 10 reps   Lumbar Exercises: Sidelying   Clam 15 reps  each side   Modalities   Modalities Electrical Stimulation;Moist Heat   Moist Heat Therapy   Number Minutes Moist Heat 15 Minutes   Moist Heat Location Lumbar  Spine;Cervical   Acupuncturist Location Lt upper trap and neck    Electrical Stimulation Action IFC   Electrical Stimulation Parameters to tolerance    Electrical Stimulation Goals Pain                PT Education - 01/12/16 1311    Education provided Yes   Education Details Pt issued transverse ab series and red band for existing program    Person(s) Educated Patient   Methods Handout;Demonstration;Explanation   Comprehension Verbalized understanding;Returned demonstration             PT Long Term Goals - 01/10/16 1049    PT LONG TERM GOAL #1   Title Improve posture and alignment in standing and with gait 01/10/16   Time 2   Period Weeks   Status Achieved   PT LONG TERM GOAL #2   Title Increase strength bilat LE's by 1/2 to 1 mm grade 01/10/16   Time 6   Period Weeks   Status On-going   PT LONG TERM GOAL #3   Title Improve standing and walking  tolerance to 15-20 min 01/10/16   Time 6   Period Weeks   Status On-going   PT LONG TERM GOAL #4   Title I in HEP 01/10/16   Time 6   Period Weeks   Status On-going   PT LONG TERM GOAL #5   Title Improve FOTO to </= 44% limitation 01/10/16   Time 6   Period Weeks   Status On-going               Plan - 01/12/16 1043    Clinical Impression Statement Pt tolerated increased resistance with UE exercises without increase in pain.  Pt has demonstrated some improvement in ROM, no significant change in strength.  She feels there has been 25% improvement. Per therapist who assessed pt  2 days ago, anticipates pt will discharge shortly after MD appt.    Pt will benefit from skilled therapeutic intervention in order to improve on the following deficits Postural dysfunction;Improper body mechanics;Pain;Decreased range of motion;Decreased strength;Increased fascial restricitons;Decreased endurance;Decreased activity tolerance;Abnormal gait   Rehab Potential Good   PT Frequency 2x / week   PT Duration 6 weeks   PT Treatment/Interventions Patient/family education;ADLs/Self Care Home Management;Therapeutic exercise;Therapeutic activities;Neuromuscular re-education;Balance training;Manual techniques;Dry needling;Cryotherapy;Electrical Stimulation;Moist Heat;Ultrasound   PT Next Visit Plan Await further instructions from MD.  If pt returns for further sessions, will continue to progress spinal stabilization and activity level .   Consulted and Agree with Plan of Care Patient        Problem List Patient Active Problem List   Diagnosis Date Noted  . Neck pain 11/17/2015  . Low back pain 11/17/2015   Michaela Aguilar, PTA 01/12/2016 1:16 PM  [facility] [place], [place], [address] Phone: [phone]   Fax:  [phone]  Name: Michaela Aguilar MRN: [id number] Date of Birth: 1966-06-06

## 2016-01-14 ENCOUNTER — Ambulatory Visit (HOSPITAL_COMMUNITY)
Admission: RE | Admit: 2016-01-14 | Discharge: 2016-01-14 | Disposition: A | Discharge status: Home / Self Care | Payer: Self-pay | Source: Ambulatory Visit | Attending: Urology | Admitting: Urology

## 2016-01-14 ENCOUNTER — Other Ambulatory Visit: Payer: Self-pay | Admitting: Urology

## 2016-01-14 ENCOUNTER — Ambulatory Visit (INDEPENDENT_AMBULATORY_CARE_PROVIDER_SITE_OTHER): Payer: Self-pay | Admitting: Urology

## 2016-01-14 DIAGNOSIS — N200 Calculus of kidney: Secondary | ICD-10-CM

## 2016-01-17 ENCOUNTER — Ambulatory Visit (INDEPENDENT_AMBULATORY_CARE_PROVIDER_SITE_OTHER): Payer: Self-pay | Admitting: Sports Medicine

## 2016-01-17 ENCOUNTER — Encounter: Payer: Self-pay | Admitting: Sports Medicine

## 2016-01-17 VITALS — BP 116/72 | HR 78 | Ht 65.0 in | Wt 232.0 lb

## 2016-01-17 DIAGNOSIS — M545 Low back pain, unspecified: Secondary | ICD-10-CM

## 2016-01-17 DIAGNOSIS — M542 Cervicalgia: Secondary | ICD-10-CM

## 2016-01-17 MED ORDER — METHYLPREDNISOLONE ACETATE 80 MG/ML IJ SUSP
80.0000 mg | Freq: Once | INTRAMUSCULAR | Status: AC
  Administered 2016-01-17: 80 mg via INTRAMUSCULAR

## 2016-01-17 NOTE — Addendum Note (Signed)
Addended by: Laurey Arrow on: 01/17/2016 11:04 AM   Modules accepted: Orders

## 2016-01-17 NOTE — Progress Notes (Signed)
   Subjective:    Patient ID: Michaela Aguilar, female    DOB: 25-Apr-1966, 50 y.o.   MRN: [id number]  HPI  Patient comes in today for follow-up on her neck and low back pain. She is still having pain despite physical therapy. However, her treatment was interrupted by a kidney stone which required surgery. She has now recovered from that. Pain is still primarily along the left side of her neck. Most noticeable with any sort of cervical rotation or direct pressure over the area. She's also having persistent posterior lateral left hip pain. The physical therapist was talking to her about trying some dry needling but she has not yet done this.       Review of Systems     as above  Objective:   Physical Exam  Well-developed, well-nourished. No acute distress   cervical spine: Patient has limited cervical rotation in all planes secondary to pain. She is tender to palpation diffusely along the left trapezius and into the rhomboids. Some slight spasm. No focal neurological deficits of either upper extremity. Negative straight leg raise bilaterally. No focal neurological deficits of either lower extremity.  MRI of her cervical spine done in July 2016 was unremarkable. MRI of her lumbar spine showed a mild L5-S1 disc protrusion but no significant stenosis and only some mild lower lumbar facet arthropathy at L5-S1. Nothing acute.      Assessment & Plan:  Cervical strain Lumbar strain  80 mg of Depo-Medrol IM for her acute pain. I think she should continue with physical therapy, specifically to try some dry needling. Follow-up with me in 4-6 weeks for reevaluation.

## 2016-01-24 ENCOUNTER — Ambulatory Visit (INDEPENDENT_AMBULATORY_CARE_PROVIDER_SITE_OTHER): Payer: Self-pay | Admitting: Physical Therapy

## 2016-01-24 DIAGNOSIS — M2560 Stiffness of unspecified joint, not elsewhere classified: Secondary | ICD-10-CM

## 2016-01-24 DIAGNOSIS — R29898 Other symptoms and signs involving the musculoskeletal system: Secondary | ICD-10-CM

## 2016-01-24 DIAGNOSIS — M623 Immobility syndrome (paraplegic): Secondary | ICD-10-CM

## 2016-01-24 DIAGNOSIS — Z7409 Other reduced mobility: Secondary | ICD-10-CM

## 2016-01-24 DIAGNOSIS — R531 Weakness: Secondary | ICD-10-CM

## 2016-01-24 DIAGNOSIS — R293 Abnormal posture: Secondary | ICD-10-CM

## 2016-01-24 DIAGNOSIS — R6889 Other general symptoms and signs: Secondary | ICD-10-CM

## 2016-01-24 NOTE — Patient Instructions (Signed)
Low Row: Standing   Face anchor, feet shoulder width apart. Palms up, pull arms back, squeezing shoulder blades together. Repeat 10__ times per set. Do 2-3__ sets per session. Do 2-3__ sessions per week. Anchor Height: Waist     Strengthening: Resisted Extension   Hold tubing in right hand, arm forward. Pull arm back, elbow straight. Repeat _10___ times per set. Do 2-3____ sets per session. Do 2-3____ sessions per day.   [facility] [place], [place] [address]  [phone] (office) [phone] (fax)

## 2016-01-24 NOTE — Therapy (Signed)
[place], [place], [address] Phone: [phone]   Fax:  [phone]  Physical Therapy Treatment  Patient Details  Name: Michaela Aguilar MRN: [id number] Date of Birth: 10-Jul-1966 Referring Provider: Dr. Lilia Argue   Encounter Date: 01/24/2016      PT End of Session - 01/24/16 0803    Visit Number 10   Number of Visits 13   Date for PT Re-Evaluation 01/24/16   PT Start Time 0733   PT Stop Time 0819   PT Time Calculation (min) 46 min      Past Medical History  Diagnosis Date  . Bronchitis   . Kidney stones per pt.  . Depression   . Anxiety     Past Surgical History  Procedure Laterality Date  . Cesarean section    . Hernia repair    . Extracorporeal shock wave lithotripsy  1995 2010    There were no vitals filed for this visit.  Visit Diagnosis:  Abnormal posture  Weakness of both lower extremities  Stiffness due to immobility  Decreased strength, endurance, and mobility      Subjective Assessment - 01/24/16 0737    Subjective Pt reports she received an injection in her Lt hip and that has helped, "I can walk a bit better".  "My neck is givng me fits."     Currently in Pain? Yes   Pain Score 6    Pain Location Neck   Pain Orientation Left   Pain Descriptors / Indicators Aching   Aggravating Factors  laying down    Pain Relieving Factors heat             [facility] PT Assessment - 01/24/16 0001    Assessment   Medical Diagnosis Lt LB and LE pain    Referring Provider Dr. Lilia Argue    Onset Date/Surgical Date 05/19/15   Hand Dominance Right   Next MD Visit 4-6 wks            [facility] Adult PT Treatment/Exercise - 01/24/16 0001    Neck Exercises: Standing   Other Standing Exercises Standing against pool noodle: with red band x 10 of horiz abd, ER, and D2 Flexion (difficult on Lt)   Other Standing Exercises Rowing x 10 x 2 sets, shoulder ext x 10 x 2 sets - both  exercises with red band    Lumbar Exercises: Stretches   Passive Hamstring Stretch 2 reps;20 seconds   Lumbar Exercises: Aerobic   Stationary Bike NuStep L4: 5 min legs/arms   Modalities   Modalities Electrical Stimulation;Moist Heat   Moist Heat Therapy   Number Minutes Moist Heat 15 Minutes   Moist Heat Location Lumbar Spine;Cervical   Electrical Stimulation   Electrical Stimulation Location Lt upper trap and neck    Electrical Stimulation Action IFC   Electrical Stimulation Parameters to tolerance    Electrical Stimulation Goals Pain   Neck Exercises: Stretches   Other Neck Stretches Doorway stretch 3 positions x 20 sec x 2 sets    Other Neck Stretches lateral neck flexion stretch x 20 sec x 2 reps                 PT Education - 01/24/16 0754    Education provided Yes   Education Details added posterior shoulder girdle to HEP   Person(s) Educated Patient   Methods Explanation;Handout   Comprehension Verbalized understanding  PT Long Term Goals - 01/10/16 1049    PT LONG TERM GOAL #1   Title Improve posture and alignment in standing and with gait 01/10/16   Time 2   Period Weeks   Status Achieved   PT LONG TERM GOAL #2   Title Increase strength bilat LE's by 1/2 to 1 mm grade 01/10/16   Time 6   Period Weeks   Status On-going   PT LONG TERM GOAL #3   Title Improve standing and walking tolerance to 15-20 min 01/10/16   Time 6   Period Weeks   Status On-going   PT LONG TERM GOAL #4   Title I in HEP 01/10/16   Time 6   Period Weeks   Status On-going   PT LONG TERM GOAL #5   Title Improve FOTO to </= 44% limitation 01/10/16   Time 6   Period Weeks   Status On-going               Plan - 01/24/16 1311    Clinical Impression Statement Pt tolerated standing postural exercises with minimal increase in pain. Pt moving around with less complaints of pain during therapy sessions, able to switch to next exercise rather quickly with less rest  breaks.  Pt reported decrease in neck and LB pain after use of estim and heat at end. Has met LTG #1 and making gradual progress towards remaining goals.    Pt will benefit from skilled therapeutic intervention in order to improve on the following deficits Postural dysfunction;Improper body mechanics;Pain;Decreased range of motion;Decreased strength;Increased fascial restricitons;Decreased endurance;Decreased activity tolerance;Abnormal gait   Rehab Potential Good   PT Frequency 2x / week   PT Duration 6 weeks   PT Treatment/Interventions Patient/family education;ADLs/Self Care Home Management;Therapeutic exercise;Therapeutic activities;Neuromuscular re-education;Balance training;Manual techniques;Dry needling;Cryotherapy;Electrical Stimulation;Moist Heat;Ultrasound   PT Next Visit Plan Continue to progress spinal stabilization and activity level .  Measure neck ROM.  TDN as schedule allows.         Problem List Patient Active Problem List   Diagnosis Date Noted  . Neck pain 11/17/2015  . Low back pain 11/17/2015   Michaela Aguilar, PTA 01/24/2016 3:37 PM  [facility] [place], [place], [address] Phone: [phone]   Fax:  [phone]  Name: Michaela Aguilar MRN: [id number] Date of Birth: 1966/07/11

## 2016-01-28 ENCOUNTER — Ambulatory Visit (INDEPENDENT_AMBULATORY_CARE_PROVIDER_SITE_OTHER): Payer: Self-pay | Admitting: Physical Therapy

## 2016-01-28 DIAGNOSIS — M2560 Stiffness of unspecified joint, not elsewhere classified: Secondary | ICD-10-CM

## 2016-01-28 DIAGNOSIS — M623 Immobility syndrome (paraplegic): Secondary | ICD-10-CM

## 2016-01-28 DIAGNOSIS — R6889 Other general symptoms and signs: Secondary | ICD-10-CM

## 2016-01-28 DIAGNOSIS — R531 Weakness: Secondary | ICD-10-CM

## 2016-01-28 DIAGNOSIS — R293 Abnormal posture: Secondary | ICD-10-CM

## 2016-01-28 DIAGNOSIS — Z7409 Other reduced mobility: Secondary | ICD-10-CM

## 2016-01-28 DIAGNOSIS — M542 Cervicalgia: Secondary | ICD-10-CM

## 2016-01-28 NOTE — Therapy (Signed)
[facility] [place] [address] [place], [place], [address] Phone: [phone]   Fax:  [phone]  Physical Therapy Treatment  Patient Details  Name: Michaela Aguilar MRN: [id number] Date of Birth: [birth year] Referring Provider: Dr Vertis Kelch  Encounter Date: 01/28/2016      PT End of Session - 01/28/16 1044    Visit Number 11   Number of Visits 18   Date for PT Re-Evaluation 02/25/16   PT Start Time 0937   PT Stop Time 1057   PT Time Calculation (min) 80 min   Activity Tolerance Patient tolerated treatment well      Past Medical History  Diagnosis Date  . Bronchitis   . Kidney stones per pt.  . Depression   . Anxiety     Past Surgical History  Procedure Laterality Date  . Cesarean section    . Hernia repair    . Extracorporeal shock wave lithotripsy  1995 2010    There were no vitals filed for this visit.  Visit Diagnosis:  Abnormal posture - Plan: PT plan of care cert/re-cert  Stiffness due to immobility - Plan: PT plan of care cert/re-cert  Decreased strength, endurance, and mobility - Plan: PT plan of care cert/re-cert  Pain, neck - Plan: PT plan of care cert/re-cert  Generalized weakness - Plan: PT plan of care cert/re-cert      Subjective Assessment - 01/28/16 0941    Subjective Pt reports that her neck gives her a fit when she tries to lies down or tries to perform her house work.  The neck hurts so much that the Lt hip pain is covered up and not feeling it as much.    Pertinent History Had a cortisone injection in the Lt hip and this helped her hip pain some.    Patient Stated Goals get rid of some of the pain    Currently in Pain? Yes   Pain Score 5    Pain Location Neck   Pain Orientation Left   Pain Radiating Towards will sometimes move to the right side    Pain Score 3   Pain Location Hip   Pain Orientation Left            [facility] PT Assessment - 01/28/16 0001    Assessment   Medical  Diagnosis Lt LB and LE pain   and neck    Referring Provider Dr Vertis Kelch   Onset Date/Surgical Date 05/19/15   Hand Dominance Right   Next MD Visit see MD after she tries TDN   Observation/Other Assessments   Focus on Therapeutic Outcomes (FOTO)  64% limited   ROM / Strength   AROM / PROM / Strength AROM   AROM   Cervical Flexion 28   Cervical Extension 25   Cervical - Right Side Bend 30   Cervical - Left Side Bend 32   Cervical - Right Rotation 50   Cervical - Left Rotation 55   Strength   Overall Strength --  Lt shoulder grossly 4-/5, Rt WNL   Right/Left Hip Right;Left   Right Hip Flexion 4+/5   Right Hip Extension 4+/5   Right Hip ABduction 4+/5   Left Hip Flexion 3-/5  with pain   Left Hip Extension 4/5  minimal pain   Left Hip ABduction 3+/5  buttock pain   Flexibility   Soft Tissue Assessment /Muscle Length --  tight pecs bilat   Quadriceps prone 140 degrees bilat knee flexion  Palpation   Spinal mobility hypomobile C3-T 4 Lt UPA   Palpation comment very tight in Lt piriformis and gluts.                      [place] Adult PT Treatment/Exercise - 01/28/16 0001    Neck Exercises: Seated   Shoulder Shrugs 10 reps   Shoulder Rolls 10 reps   Other Seated Exercise side bend stretches, retraction, shoulders and cervical   Modalities   Modalities Electrical Stimulation;Moist Heat   Moist Heat Therapy   Number Minutes Moist Heat 15 Minutes   Moist Heat Location Cervical;Shoulder  Lt pecs, posterior shoulder girdle   Electrical Stimulation   Electrical Stimulation Location same as heat   Electrical Stimulation Action IFC   Electrical Stimulation Parameters to tolerance   Electrical Stimulation Goals Pain;Tone   Manual Therapy   Manual Therapy Soft tissue mobilization;Joint mobilization   Joint Mobilization cervical and upper thoracic mobs grade III CPA and Lt UPA   Soft tissue mobilization Lt upper trap, cervical paraspinals and periocciput           Trigger Point Dry Needling - 01/28/16 1023    Consent Given? Yes   Education Handout Provided Yes   Muscles Treated Upper Body Upper trapezius;Oblique capitus;Suboccipitals muscle group;Pectoralis major  Lt side   Upper Trapezius Response Twitch reponse elicited;Palpable increased muscle length   Oblique Capitus Response Twitch response elicited;Palpable increased muscle length   SubOccipitals Response Twitch response elicited;Palpable increased muscle length   Pectoralis Major Response Twitch response elicited;Palpable increased muscle length              PT Education - 01/28/16 1046    Education provided Yes   Education Details TDN   Person(s) Educated Patient   Methods Explanation;Handout   Comprehension Returned demonstration;Verbalized understanding             PT Long Term Goals - 01/28/16 1051    PT LONG TERM GOAL #1   Title Improve posture and alignment in standing and with gait 01/10/16   Status Achieved   PT LONG TERM GOAL #2   Title Increase strength bilat LE's by 1/2 to 1 mm grade 02/25/16   Time 4   Period Weeks   Status Partially Met   PT LONG TERM GOAL #3   Title Improve standing and walking tolerance to 15-20 min 02/25/16   Time 4   Period Weeks   Status On-going   PT LONG TERM GOAL #4   Title I in HEP 02/25/16   Time 4   Period Weeks   Status On-going   PT LONG TERM GOAL #5   Title Improve FOTO to </= 44% limitation 02/25/16   Time 4   Period Weeks   Status On-going   Additional Long Term Goals   Additional Long Term Goals Yes   PT LONG TERM GOAL #6   Title demo painfree cervical ROM ( 02/25/16)    Time 4   Period Weeks   Status New               Plan - 01/28/16 1046    Clinical Impression Statement Patient with multiple areas of muscular tightness in her Lt shoulder complex, cervical/thoracic region and Lt gluts/piriformis.  She has good twitch responses to TDN of the upper shoulder complex with some increased cervical  rotation afterwards.  she hasn't met any more goals however TDN should help her.  Patient continues with guarding posturing due  to pain and this doesn't help her situation.  This was discussed with patient and she verbalized understanding and desire to break this habit.    Pt will benefit from skilled therapeutic intervention in order to improve on the following deficits Postural dysfunction;Improper body mechanics;Pain;Decreased range of motion;Decreased strength;Increased fascial restricitons;Decreased endurance;Decreased activity tolerance;Abnormal gait   Rehab Potential Good   PT Frequency 2x / week   PT Duration 4 weeks   PT Treatment/Interventions Patient/family education;ADLs/Self Care Home Management;Therapeutic exercise;Therapeutic activities;Neuromuscular re-education;Balance training;Manual techniques;Dry needling;Cryotherapy;Electrical Stimulation;Moist Heat;Ultrasound;Traction   PT Next Visit Plan assess response to TDN, revisit Lt buttocks PRN, continue TDN and possibly trial of cervical traction to decrease muscular gaurding.  Will renew patient treatment to allow for TDN trial     Consulted and Agree with Plan of Care Patient        Problem List Patient Active Problem List   Diagnosis Date Noted  . Neck pain 11/17/2015  . Low back pain 11/17/2015    Jeral Pinch PT 01/28/2016, 11:01 AM  [facility] [place], [place], [address] Phone: [phone]   Fax:  [phone]  Name: CORETTA LEISEY MRN: [id number] Date of Birth: May 11, 1966

## 2016-01-28 NOTE — Patient Instructions (Signed)

## 2016-01-31 ENCOUNTER — Encounter: Payer: Self-pay | Admitting: Physical Therapy

## 2016-02-04 ENCOUNTER — Encounter: Payer: Self-pay | Admitting: Rehabilitative and Restorative Service Providers"

## 2016-02-18 ENCOUNTER — Ambulatory Visit: Payer: Self-pay | Admitting: Urology

## 2016-03-17 ENCOUNTER — Ambulatory Visit: Payer: Self-pay | Admitting: Family Medicine

## 2016-04-03 ENCOUNTER — Encounter: Payer: Self-pay | Admitting: Sports Medicine

## 2016-04-03 ENCOUNTER — Ambulatory Visit (INDEPENDENT_AMBULATORY_CARE_PROVIDER_SITE_OTHER): Payer: Self-pay | Admitting: Sports Medicine

## 2016-04-03 VITALS — BP 129/66 | Ht 65.0 in | Wt 232.0 lb

## 2016-04-03 DIAGNOSIS — M5442 Lumbago with sciatica, left side: Secondary | ICD-10-CM

## 2016-04-03 DIAGNOSIS — M542 Cervicalgia: Secondary | ICD-10-CM

## 2016-04-03 MED ORDER — METHYLPREDNISOLONE ACETATE 80 MG/ML IJ SUSP
80.0000 mg | Freq: Once | INTRAMUSCULAR | Status: AC
  Administered 2016-04-03: 80 mg via INTRAMUSCULAR

## 2016-04-03 NOTE — Progress Notes (Signed)
   Subjective:    Patient ID: Michaela Aguilar, female    DOB: Aug 11, 1966, 50 y.o.   MRN: [id number]  HPI   Patient comes in today for follow-up on neck and low back pain. She was last seen in the office in March. An MRI of her cervical spine done in 2016 was unremarkable. An MRI of her lumbar spine in 2016 showed a mild L5-S1 disc protrusion but no significant stenosis. She did have great success with physical therapy, especially dry needling for her neck, but her financial assistance plan expired. She has since been reapproved and would like to return to physical therapy. She complains of a tightness in her cervical spine. She is also getting left-sided low back pain that will radiate into her left leg. Some left lower leg cramping as well. She feels a little bit unsteady and would like a prescription for a cane. She would also like a repeat IM Depo-Medrol injection today. The injection administered in March helped her for about 2 weeks.    Review of Systems     Objective:   Physical Exam  Well-developed, well-nourished. No acute distress.  Cervical spine: Limited range of motion in all planes. Tenderness to palpation along the paraspinal musculature. No spasm. No focal neurological deficits of either upper extremity  Neurological exam: She has brisk and equal reflexes at the Achilles and patellar tendons bilaterally. Negative straight leg raise bilaterally. No atrophy. Good strength. She does walk with a slight limp.      Assessment & Plan:   Chronic neck pain secondary to cervical strain Low back pain with left leg radiculopathy with MRI evidence of a small L5-S1 disc protrusion  I agree with a return to physical therapy. I've also given her a prescription for a cane. We will inject her with 80 mg of Depo-Medrol IM. I've reassured her that her cervical spine MRI is unremarkable. In regards to her low back pain and left leg radiculopathy, if symptoms persist despite physical therapy, we  could consider a diagnostic/therapeutic epidural steroid injection. She will follow-up as needed.

## 2016-04-11 ENCOUNTER — Encounter: Payer: Self-pay | Admitting: Rehabilitative and Restorative Service Providers"

## 2016-04-11 ENCOUNTER — Ambulatory Visit (INDEPENDENT_AMBULATORY_CARE_PROVIDER_SITE_OTHER): Payer: Self-pay | Admitting: Rehabilitative and Restorative Service Providers"

## 2016-04-11 DIAGNOSIS — M542 Cervicalgia: Secondary | ICD-10-CM

## 2016-04-11 DIAGNOSIS — R293 Abnormal posture: Secondary | ICD-10-CM

## 2016-04-11 DIAGNOSIS — R29898 Other symptoms and signs involving the musculoskeletal system: Secondary | ICD-10-CM

## 2016-04-11 DIAGNOSIS — M6281 Muscle weakness (generalized): Secondary | ICD-10-CM

## 2016-04-11 NOTE — Therapy (Signed)
[facility] [place], [place], [address] Phone: [phone]   Fax:  [phone]  Physical Therapy Evaluation  Patient Details  Name: Michaela Aguilar MRN: [id number] Date of Birth: [birth year] Referring Provider: Dr. Star Age   Encounter Date: 04/11/2016      PT End of Session - 04/11/16 1021    Visit Number 1   Number of Visits 12   Date for PT Re-Evaluation 05/23/16   PT Start Time 0930   PT Stop Time 1029   PT Time Calculation (min) 59 min   Activity Tolerance Patient tolerated treatment well      Past Medical History  Diagnosis Date  . Bronchitis   . Kidney stones per pt.  . Depression   . Anxiety     Past Surgical History  Procedure Laterality Date  . Cesarean section    . Hernia repair    . Extracorporeal shock wave lithotripsy  1995 2010    There were no vitals filed for this visit.       Subjective Assessment - 04/11/16 0937    Subjective Patient reports that she had great relief from the dry needling in the neck at her last visit. She was waiting for financila assistance to continue with PT. Would like to try the TDN a few more times for her neck. She is now having more pain in the Lt hip which overshadows the neck pain.    Pertinent History Had a cortisone injection in the Lt hip and this helped her hip pain temporarily.    How long can you sit comfortably? 5-10 min    How long can you stand comfortably? 2 min    How long can you walk comfortably? 2 min    Diagnostic tests xrays; CT scan    Patient Stated Goals get rid of some of the pain    Currently in Pain? Yes   Pain Score 7    Pain Location Neck   Pain Orientation Left   Pain Descriptors / Indicators Aching   Pain Type Chronic pain   Pain Radiating Towards sometimes into the Rt side of her neck; down into the Lt arm at times    Pain Onset More than a month ago   Pain Frequency Constant   Aggravating Factors  lying  down; reaching with Lt UE   Pain Relieving Factors TDN; heat; TENs unit; better during the day   Pain Score 7   Pain Location Hip   Pain Orientation Left   Pain Descriptors / Indicators Aching;Constant   Pain Type Chronic pain   Pain Radiating Towards into the back of the Lt leg around into the front of the Lt knee area; cramping into the front of the calf    Pain Onset More than a month ago   Pain Frequency Constant   Aggravating Factors  standing > 5-7 min; walking 5-7 min; bending; lifting; reaching    Pain Relieving Factors heat; TENS unit; swimming or walking in the water 1-2 times/wk             [facility] PT Assessment - 04/11/16 0001    Assessment   Medical Diagnosis Neck pain    Referring Provider Dr. Christia Reading Drapper    Onset Date/Surgical Date 05/19/15   Hand Dominance Right   Next MD Visit as needed (will see PT for ~8 visits and schedule return)    Precautions   Precautions None   Balance Screen  Has the patient fallen in the past 6 months Yes   How many times? 2   Has the patient had a decrease in activity level because of a fear of falling?  No   Is the patient reluctant to leave their home because of a fear of falling?  No   Home Environment   Additional Comments single level home 15 steps to enter railing on Lt ascending - difficult due to pain    Prior Function   Level of Independence Independent   Vocation Other (comment);Unemployed   Vocation Requirements unemployed since 2010 - in school for 2 years - not working or in school when she fell    Leisure household chores; sedentary life style    Observation/Other Assessments   Focus on Therapeutic Outcomes (FOTO)  66% limitation    Sensation   Additional Comments Lt UE WFL's; pins and needles in the whole foot - feels "asleep" comes and goes    Posture/Postural Control   Posture Comments head forward; shoulders rounded and elevated; incresaed thoracic kyphosis; weight shifted to Rt; trunk shifted to the Rt     AROM   Cervical Flexion 50   Cervical Extension 33   Cervical - Right Side Bend 32   Cervical - Left Side Bend 37   Cervical - Right Rotation 62   Cervical - Left Rotation 58   Strength   Overall Strength Comments UE strength asses - patient unable to give maximum effort for testing due to pain in LT UE - muscle strength testing less accurate - Grossly Lt UE strength 4+/5 to 5-5 throughout    Flexibility   Soft Tissue Assessment /Muscle Length --  tight pecs bilat   Palpation   Palpation comment muscular tightness through the Lt upper quarter with sensitivity through ant/lat/post cervical musculature and upper trap as well as pecs    Ambulation/Gait   Gait Comments antalgic gait limp on Lt LE with Lt LE ER/toe out; using quad cane with weight shifted to the Rt throughout gait pattern                    [facility] Adult PT Treatment/Exercise - 04/11/16 0001    Modalities   Modalities Electrical Stimulation;Moist Heat   Moist Heat Therapy   Number Minutes Moist Heat 15 Minutes   Moist Heat Location Cervical;Shoulder  Lt pecs, posterior shoulder girdle   Electrical Stimulation   Electrical Stimulation Location Lt cervical musculature; Lt upper trap   Electrical Stimulation Action IFC   Electrical Stimulation Parameters to tolerance   Electrical Stimulation Goals Pain;Tone   Manual Therapy   Manual Therapy Soft tissue mobilization   Soft tissue mobilization Lt upper trap, cervical paraspinals and periocciput          Trigger Point Dry Needling - 04/11/16 0958    Consent Given? Yes   Education Handout Provided Yes   Upper Trapezius Response Twitch reponse elicited;Palpable increased muscle length   Oblique Capitus Response Twitch response elicited;Palpable increased muscle length   SubOccipitals Response Twitch response elicited;Palpable increased muscle length                   PT Long Term Goals - 04/11/16 1229    PT LONG TERM GOAL #1   Title Improve  posture and alignment in standing and with gait 05/23/16   Time 6   Period Weeks   Status New   PT LONG TERM GOAL #2   Title Increase strength bilat  UE's to 5-/5 to 5/5 throughout 05/23/16   Time 6   Period Weeks   Status New   PT LONG TERM GOAL #3   Title Decrease muscular tightness to palpation and improve tissue extensibility Lt upper quarter 05/23/16   Time 6   Period Weeks   PT LONG TERM GOAL #4   Title I in HEP 05/23/16   Time 6   Period Weeks   Status New   PT LONG TERM GOAL #5   Title Improve FOTO to </= 44% limitation 05/23/16   Time 6   Period Weeks   Status New   PT LONG TERM GOAL #6   Title demo painfree cervical ROM ( 05/23/16)    Time 6   Period Weeks   Status New               Plan - 04/11/16 1225    Clinical Impression Statement Patient presents with multiple areas of pain including neck pain and discomfort. Symptoms responded well to TDN treatment and patient returns for additional treatment of cervical dysfunction with TDN as weil as postural correction; exercise and education. She has poor posture and alignment ; limited cervical mobility; muscular tightness to palpation and pain on a frequent basis.    Rehab Potential Good   PT Frequency 2x / week   PT Duration 6 weeks   PT Treatment/Interventions Patient/family education;ADLs/Self Care Home Management;Therapeutic exercise;Therapeutic activities;Neuromuscular re-education;Balance training;Manual techniques;Dry needling;Cryotherapy;Electrical Stimulation;Moist Heat;Ultrasound;Traction   PT Next Visit Plan assess response to TDN; progress with postural correction and appropriate exercise for cervical dysfunction; further assessment of Lt hip and back pain as MD orders.    PT Home Exercise Plan HEP; continue use of friend's TENS unit for pain management   Consulted and Agree with Plan of Care Patient      Patient will benefit from skilled therapeutic intervention in order to improve the following deficits  and impairments:  Postural dysfunction, Improper body mechanics, Pain, Decreased range of motion, Decreased strength, Increased fascial restricitons, Decreased endurance, Decreased activity tolerance, Abnormal gait  Visit Diagnosis: Abnormal posture - Plan: PT plan of care cert/re-cert  Other symptoms and signs involving the musculoskeletal system - Plan: PT plan of care cert/re-cert  Muscle weakness (generalized) - Plan: PT plan of care cert/re-cert  Cervicalgia - Plan: PT plan of care cert/re-cert     Problem List Patient Active Problem List   Diagnosis Date Noted  . Neck pain 11/17/2015  . Low back pain 11/17/2015    Errika Narvaiz Nilda Simmer PT, MPH  04/11/2016, 12:39 PM  [facility] [place], [place], [address] Phone: [phone]   Fax:  [phone]  Name: Michaela Aguilar MRN: [id number] Date of Birth: 1966-09-13

## 2016-04-17 ENCOUNTER — Ambulatory Visit (INDEPENDENT_AMBULATORY_CARE_PROVIDER_SITE_OTHER): Payer: Self-pay | Admitting: Physical Therapy

## 2016-04-17 DIAGNOSIS — R29898 Other symptoms and signs involving the musculoskeletal system: Secondary | ICD-10-CM

## 2016-04-17 DIAGNOSIS — M542 Cervicalgia: Secondary | ICD-10-CM

## 2016-04-17 DIAGNOSIS — M6281 Muscle weakness (generalized): Secondary | ICD-10-CM

## 2016-04-17 DIAGNOSIS — R293 Abnormal posture: Secondary | ICD-10-CM

## 2016-04-17 NOTE — Patient Instructions (Signed)
Resisted External Rotation: in Neutral - Bilateral   PALMS UP Sit or stand, tubing in both hands, elbows at sides, bent to 90, forearms forward. Pinch shoulder blades together and rotate forearms out. Keep elbows at sides. Repeat __10__ times per set. Do _2-3___ sets per session. Do _2-3___ sessions per week.   Low Row: Standing   Face anchor, feet shoulder width apart. Palms up, pull arms back, squeezing shoulder blades together. Repeat 10__ times per set. Do 2-3__ sets per session. Do 2-3__ sessions per week. Anchor Height: Waist  Sash   On back, knees bent, feet flat, left hand on left hip, right hand above left. Pull right arm DIAGONALLY (hip to shoulder) across chest. Bring right arm along head toward floor. Hold momentarily. Slowly return to starting position. Repeat _10__ times, 2 sets. Do with left, then right arm. Band color __yellow____   [facility] [place] [place], [place] [address]  [phone] (office) [phone] (fax)

## 2016-04-17 NOTE — Therapy (Signed)
[place], [place], [address] Phone: [phone]   Fax:  [phone]  Physical Therapy Treatment  Patient Details  Name: Michaela Aguilar MRN: [id number] Date of Birth: 1966/04/07 Referring Provider: Dr. Lilia Argue   Encounter Date: 04/17/2016      PT End of Session - 04/17/16 0810    Visit Number 2   Number of Visits 12   Date for PT Re-Evaluation 05/23/16   PT Start Time 0806  pt arrived late   PT Stop Time 0857   PT Time Calculation (min) 51 min   Activity Tolerance Patient tolerated treatment well      Past Medical History  Diagnosis Date  . Bronchitis   . Kidney stones per pt.  . Depression   . Anxiety     Past Surgical History  Procedure Laterality Date  . Cesarean section    . Hernia repair    . Extracorporeal shock wave lithotripsy  1995 2010    There were no vitals filed for this visit.      Subjective Assessment - 04/17/16 0811    Subjective Pt reports her neck is not hurting as bad when she is supine.  [name] believes the needing has helped.  She continues to use TENS and heat.     Currently in Pain? Yes   Pain Score 3    Pain Location Neck   Pain Orientation Left   Pain Descriptors / Indicators Aching   Pain Score 7   Pain Location Hip   Pain Orientation Left   Pain Descriptors / Indicators Aching   Aggravating Factors  prolonged standing, bending, lifting   Pain Relieving Factors heat, TENS, swimming.             [facility] PT Assessment - 04/17/16 0001    Assessment   Medical Diagnosis Neck pain    Referring Provider Dr. Lilia Argue    Onset Date/Surgical Date 05/19/15   Hand Dominance Right   Next MD Visit as needed (will see PT for ~8 visits and schedule return)    AROM   Cervical - Right Side Bend 40   Cervical - Left Side Bend 40   Cervical - Right Rotation 65   Cervical - Left Rotation 70          [facility] Adult PT Treatment/Exercise - 04/17/16  0001    Exercises   Exercises Neck;Shoulder   Neck Exercises: Machines for Strengthening   UBE (Upper Arm Bike) L1: 2 min each direction    Neck Exercises: Theraband   Shoulder External Rotation 15 reps  yellow    Shoulder Exercises: Supine   Other Supine Exercises D2 sash with yellow band x 10 reps each arm.    Shoulder Exercises: Standing   Extension Strengthening;Both;10 reps;Theraband  2 sets   Theraband Level (Shoulder Extension) Level 1 (Yellow)   Row 10 reps;Both;Theraband  2 sets   Theraband Level (Shoulder Row) Level 1 (Yellow)   Other Standing Exercises scap retraction around pool noodle x 5 sec hold x 10 reps    Other Standing Exercises bilat shoulder ER with yellow band x 10 reps x 2 sets   Modalities   Modalities Electrical Stimulation;Moist Heat   Moist Heat Therapy   Number Minutes Moist Heat 15 Minutes   Moist Heat Location Cervical;Hip   Electrical Stimulation   Electrical Stimulation Location Lt cervical musculature; Lt upper trap   Electrical Stimulation Action IFC   Electrical Stimulation  Parameters to tolerance    Electrical Stimulation Goals Pain;Tone   Neck Exercises: Stretches   Upper Trapezius Stretch 3 reps;20 seconds                PT Education - 04/17/16 0845    Education provided Yes   Education Details HEP   Person(s) Educated Patient   Methods Explanation;Handout   Comprehension Returned demonstration;Verbalized understanding             PT Long Term Goals - 04/11/16 1229    PT LONG TERM GOAL #1   Title Improve posture and alignment in standing and with gait 05/23/16   Time 6   Period Weeks   Status New   PT LONG TERM GOAL #2   Title Increase strength bilat UE's to 5-/5 to 5/5 throughout 05/23/16   Time 6   Period Weeks   Status New   PT LONG TERM GOAL #3   Title Decrease muscular tightness to palpation and improve tissue extensibility Lt upper quarter 05/23/16   Time 6   Period Weeks   PT LONG TERM GOAL #4   Title I  in HEP 05/23/16   Time 6   Period Weeks   Status New   PT LONG TERM GOAL #5   Title Improve FOTO to </= 44% limitation 05/23/16   Time 6   Period Weeks   Status New   PT LONG TERM GOAL #6   Title demo painfree cervical ROM ( 05/23/16)    Time 6   Period Weeks   Status New               Plan - 04/17/16 [id number]    Clinical Impression Statement Pt tolerated all exercises well, without increase in pain.  Pt required minimal cues for proper form of exercise. Pt reported decrease in symptoms with use of estim/ MHP at end of session.   Pt demonstrated improved cervical ROM this visit.  Progressing towards goals.    Rehab Potential Good   PT Frequency 2x / week   PT Duration 6 weeks   PT Treatment/Interventions Patient/family education;ADLs/Self Care Home Management;Therapeutic exercise;Therapeutic activities;Neuromuscular re-education;Balance training;Manual techniques;Dry needling;Cryotherapy;Electrical Stimulation;Moist Heat;Ultrasound;Traction   PT Next Visit Plan Continue progressive postural correction and appropriate exercise for cervical dysfunction; further assessment of Lt hip and back pain as MD orders.    Consulted and Agree with Plan of Care Patient      Patient will benefit from skilled therapeutic intervention in order to improve the following deficits and impairments:  Postural dysfunction, Improper body mechanics, Pain, Decreased range of motion, Decreased strength, Increased fascial restricitons, Decreased endurance, Decreased activity tolerance, Abnormal gait  Visit Diagnosis: Abnormal posture  Other symptoms and signs involving the musculoskeletal system  Muscle weakness (generalized)  Cervicalgia     Problem List Patient Active Problem List   Diagnosis Date Noted  . Neck pain 11/17/2015  . Low back pain 11/17/2015   [name], PTA 04/17/2016 8:48 AM  [facility] [place], [place], [address] Phone: [phone]   Fax:  [phone]  Name: Michaela Aguilar MRN: [id number] Date of Birth: 1966/02/10

## 2016-04-19 ENCOUNTER — Encounter: Payer: Self-pay | Admitting: Rehabilitative and Restorative Service Providers"

## 2016-04-21 ENCOUNTER — Encounter: Payer: Self-pay | Admitting: Rehabilitative and Restorative Service Providers"

## 2016-04-23 ENCOUNTER — Emergency Department (HOSPITAL_COMMUNITY)
Admission: EM | Admit: 2016-04-23 | Discharge: 2016-04-23 | Disposition: A | Discharge status: Home / Self Care | Payer: Self-pay | Attending: Emergency Medicine | Admitting: Emergency Medicine

## 2016-04-23 ENCOUNTER — Emergency Department (HOSPITAL_COMMUNITY): Payer: Self-pay

## 2016-04-23 ENCOUNTER — Encounter (HOSPITAL_COMMUNITY): Payer: Self-pay | Admitting: *Deleted

## 2016-04-23 DIAGNOSIS — Z87891 Personal history of nicotine dependence: Secondary | ICD-10-CM | POA: Insufficient documentation

## 2016-04-23 DIAGNOSIS — J40 Bronchitis, not specified as acute or chronic: Secondary | ICD-10-CM | POA: Insufficient documentation

## 2016-04-23 DIAGNOSIS — Z79899 Other long term (current) drug therapy: Secondary | ICD-10-CM | POA: Insufficient documentation

## 2016-04-23 DIAGNOSIS — F329 Major depressive disorder, single episode, unspecified: Secondary | ICD-10-CM | POA: Insufficient documentation

## 2016-04-23 LAB — CBC WITH DIFFERENTIAL/PLATELET
Basophils Absolute: 0 10*3/uL (ref 0.0–0.1)
Basophils Relative: 0 %
EOS ABS: 0.2 10*3/uL (ref 0.0–0.7)
Eosinophils Relative: 1 %
HEMATOCRIT: 41.5 % (ref 36.0–46.0)
HEMOGLOBIN: 14.1 g/dL (ref 12.0–15.0)
LYMPHS ABS: 2.1 10*3/uL (ref 0.7–4.0)
Lymphocytes Relative: 17 %
MCH: 32.2 pg (ref 26.0–34.0)
MCHC: 34 g/dL (ref 30.0–36.0)
MCV: 94.7 fL (ref 78.0–100.0)
MONOS PCT: 7 %
Monocytes Absolute: 0.9 10*3/uL (ref 0.1–1.0)
NEUTROS ABS: 9.1 10*3/uL — AB (ref 1.7–7.7)
NEUTROS PCT: 75 %
Platelets: 378 10*3/uL (ref 150–400)
RBC: 4.38 MIL/uL (ref 3.87–5.11)
RDW: 12 % (ref 11.5–15.5)
WBC: 12.3 10*3/uL — AB (ref 4.0–10.5)

## 2016-04-23 LAB — COMPREHENSIVE METABOLIC PANEL
ALBUMIN: 3.9 g/dL (ref 3.5–5.0)
ALK PHOS: 81 U/L (ref 38–126)
ALT: 24 U/L (ref 14–54)
AST: 19 U/L (ref 15–41)
Anion gap: 7 (ref 5–15)
BILIRUBIN TOTAL: 0.4 mg/dL (ref 0.3–1.2)
BUN: 13 mg/dL (ref 6–20)
CALCIUM: 9.6 mg/dL (ref 8.9–10.3)
CO2: 26 mmol/L (ref 22–32)
CREATININE: 0.69 mg/dL (ref 0.44–1.00)
Chloride: 103 mmol/L (ref 101–111)
GFR calc non Af Amer: >60 mL/min (ref >60)
GLUCOSE: 242 mg/dL — AB (ref 65–99)
Potassium: 3.7 mmol/L (ref 3.5–5.1)
SODIUM: 136 mmol/L (ref 135–145)
TOTAL PROTEIN: 7 g/dL (ref 6.5–8.1)

## 2016-04-23 MED ORDER — METHYLPREDNISOLONE SODIUM SUCC 125 MG IJ SOLR
125.0000 mg | Freq: Once | INTRAMUSCULAR | Status: AC
  Administered 2016-04-23: 125 mg via INTRAVENOUS
  Filled 2016-04-23: qty 2

## 2016-04-23 MED ORDER — ALBUTEROL SULFATE (2.5 MG/3ML) 0.083% IN NEBU: 5.0000 mg | INHALATION_SOLUTION | Freq: Once | RESPIRATORY_TRACT | Status: DC

## 2016-04-23 MED ORDER — IPRATROPIUM-ALBUTEROL 0.5-2.5 (3) MG/3ML IN SOLN
3.0000 mL | Freq: Once | RESPIRATORY_TRACT | Status: AC
  Administered 2016-04-23: 3 mL via RESPIRATORY_TRACT
  Filled 2016-04-23: qty 3

## 2016-04-23 MED ORDER — ALBUTEROL SULFATE HFA 108 (90 BASE) MCG/ACT IN AERS
2.0000 | INHALATION_SPRAY | RESPIRATORY_TRACT | Status: DC | PRN
  Administered 2016-04-23: 2 via RESPIRATORY_TRACT
  Filled 2016-04-23: qty 6.7

## 2016-04-23 MED ORDER — AZITHROMYCIN 250 MG PO TABS: ORAL_TABLET | ORAL | Status: DC

## 2016-04-23 MED ORDER — ALBUTEROL SULFATE (2.5 MG/3ML) 0.083% IN NEBU
2.5000 mg | INHALATION_SOLUTION | Freq: Once | RESPIRATORY_TRACT | Status: AC
  Administered 2016-04-23: 2.5 mg via RESPIRATORY_TRACT
  Filled 2016-04-23: qty 3

## 2016-04-23 NOTE — ED Notes (Signed)
Pt c/o difficulty breathing that started last night. Pt reports that when she coughs she feels as if her "throat is closing up". Pt reports small yellow colored productive cough. Pt also c/o mid chest pain that radiates up to her throat.

## 2016-04-23 NOTE — ED Provider Notes (Signed)
CSN: [id number]     Arrival date & time 04/23/16  1451 History   First MD Initiated Contact with Patient 04/23/16 1505     Chief Complaint  Patient presents with  . Shortness of Breath     (Consider location/radiation/quality/duration/timing/severity/associated sxs/prior Treatment) Patient is a 50 y.o. female presenting with shortness of breath. The history is provided by the patient (She complains of some cough and wheezing little shortness of breath).  Shortness of Breath Severity:  Moderate Onset quality:  Sudden Timing:  Intermittent Progression:  Waxing and waning Chronicity:  New Context: activity   Associated symptoms: wheezing   Associated symptoms: no abdominal pain, no chest pain, no cough, no headaches and no rash     Past Medical History  Diagnosis Date  . Bronchitis   . Kidney stones per pt.  . Depression   . Anxiety    Past Surgical History  Procedure Laterality Date  . Cesarean section    . Hernia repair    . Extracorporeal shock wave lithotripsy  1995 2010   No family history on file. Social History  Substance Use Topics  . Smoking status: Former Smoker    Quit date: 08/08/2013  . Smokeless tobacco: None  . Alcohol Use: No   OB History    No data available     Review of Systems  Constitutional: Negative for appetite change and fatigue.  HENT: Negative for congestion, ear discharge and sinus pressure.   Eyes: Negative for discharge.  Respiratory: Positive for shortness of breath and wheezing. Negative for cough.   Cardiovascular: Negative for chest pain.  Gastrointestinal: Negative for abdominal pain and diarrhea.  Genitourinary: Negative for frequency and hematuria.  Musculoskeletal: Negative for back pain.  Skin: Negative for rash.  Neurological: Negative for seizures and headaches.  Psychiatric/Behavioral: Negative for hallucinations.      Allergies  Review of patient's allergies indicates no known allergies.  Home Medications    Prior to Admission medications   Medication Sig Start Date End Date Taking? Authorizing Provider  ALPRAZolam Duanne Moron) 1 MG tablet Take 1 mg by mouth 2 (two) times daily as needed for anxiety. For pain   Yes Historical Provider, MD  azithromycin (ZITHROMAX Z-PAK) 250 MG tablet 2 po day one, then 1 daily x 4 days 04/23/16   Milton Ferguson, MD   BP 110/70 mmHg  Pulse 74  Temp(Src) 98.1 F (36.7 C) (Oral)  Resp 15  Ht 5\' 5"  (1.651 m)  Wt 234 lb (106.142 kg)  BMI 38.94 kg/m2  SpO2 99%  LMP 02/16/2014 Physical Exam  Constitutional: She is oriented to person, place, and time. She appears well-developed.  HENT:  Head: Normocephalic.  Eyes: Conjunctivae and EOM are normal. No scleral icterus.  Neck: Neck supple. No thyromegaly present.  Cardiovascular: Normal rate and regular rhythm.  Exam reveals no gallop and no friction rub.   No murmur heard. Pulmonary/Chest: No stridor. She has wheezes. She has no rales. She exhibits no tenderness.  Abdominal: She exhibits no distension. There is no tenderness. There is no rebound.  Musculoskeletal: Normal range of motion. She exhibits no edema.  Lymphadenopathy:    She has no cervical adenopathy.  Neurological: She is oriented to person, place, and time. She exhibits normal muscle tone. Coordination normal.  Skin: No rash noted. No erythema.  Psychiatric: She has a normal mood and affect. Her behavior is normal.    ED Course  Procedures (including critical care time) Labs Review Labs Reviewed  CBC WITH  DIFFERENTIAL/PLATELET - Abnormal; Notable for the following:    WBC 12.3 (*)    Neutro Abs 9.1 (*)    All other components within normal limits  COMPREHENSIVE METABOLIC PANEL - Abnormal; Notable for the following:    Glucose, Bld 242 (*)    All other components within normal limits    Imaging Review Dg Chest 2 View  04/23/2016  CLINICAL DATA:  Shortness of breath. Productive cough. Chest pain. Ex-smoker. EXAM: CHEST  2 VIEW COMPARISON:   12/13/2011 FINDINGS: Midline trachea. Normal heart size and mediastinal contours. No pleural effusion or pneumothorax. Numerous leads and wires project over the chest. Clear lungs. IMPRESSION: No acute cardiopulmonary disease. Electronically Signed   By: Abigail Miyamoto M.D.   On: 04/23/2016 15:42   I have personally reviewed and evaluated these images and lab results as part of my medical decision-making.   EKG Interpretation None      MDM   Final diagnoses:  Bronchitis    Patient with bronchitis and bronchospasm. Patient's wheezing improved with albuterol. She'll be sent home with Z-Pak and albuterol inhaler and will follow-up if not improving    Milton Ferguson, MD 04/23/16 1655

## 2016-04-23 NOTE — ED Notes (Signed)
RT at bedside.

## 2016-04-23 NOTE — ED Notes (Signed)
RT paged for inhaler

## 2016-04-23 NOTE — Discharge Instructions (Signed)
Follow up in 1-2 weeks if not improving.

## 2016-04-24 ENCOUNTER — Ambulatory Visit (INDEPENDENT_AMBULATORY_CARE_PROVIDER_SITE_OTHER): Payer: Self-pay | Admitting: Physical Therapy

## 2016-04-24 DIAGNOSIS — M542 Cervicalgia: Secondary | ICD-10-CM

## 2016-04-24 DIAGNOSIS — R293 Abnormal posture: Secondary | ICD-10-CM

## 2016-04-24 DIAGNOSIS — R29898 Other symptoms and signs involving the musculoskeletal system: Secondary | ICD-10-CM

## 2016-04-24 DIAGNOSIS — M6281 Muscle weakness (generalized): Secondary | ICD-10-CM

## 2016-04-24 NOTE — Therapy (Signed)
[place], [place], [address] Phone: [phone]   Fax:  [phone]  Physical Therapy Treatment  Patient Details  Name: Michaela Aguilar MRN: [id number] Date of Birth: [birth year] Referring Provider: Dr. Lilia Argue   Encounter Date: 04/24/2016      PT End of Session - 04/24/16 0717    Visit Number 3   Number of Visits 12   Date for PT Re-Evaluation 05/23/16   PT Start Time 0715   PT Stop Time 0804   PT Time Calculation (min) 49 min      Past Medical History  Diagnosis Date  . Bronchitis   . Kidney stones per pt.  . Depression   . Anxiety     Past Surgical History  Procedure Laterality Date  . Cesarean section    . Hernia repair    . Extracorporeal shock wave lithotripsy  1995 2010    There were no vitals filed for this visit.      Subjective Assessment - 04/24/16 0717    Subjective Pt reports she had a headache all night; didn't sleep well.  She reports increased neck pain today.    Currently in Pain? Yes   Pain Score 6    Pain Location Neck   Pain Orientation Left   Pain Descriptors / Indicators Aching   Aggravating Factors  reaching with Lt UE   Pain Relieving Factors TDN, heat, TENS            [facility] PT Assessment - 04/24/16 0001    Assessment   Medical Diagnosis Neck pain    Referring Provider Dr. Lilia Argue    Onset Date/Surgical Date 05/19/15   Hand Dominance Right   Next MD Visit as needed (will see PT for ~8 visits and schedule return)    AROM   Cervical Flexion 50   Cervical Extension 50   Cervical - Right Side Bend 50   Cervical - Left Side Bend 50          [facility] Adult PT Treatment/Exercise - 04/24/16 0001    Neck Exercises: Machines for Strengthening   UBE (Upper Arm Bike) L1: 2 min each direction    Neck Exercises: Theraband   Shoulder External Rotation 10 reps;Red  2 sets, with scap squeeze around noodle   Neck Exercises: Standing   Neck  Retraction 10 reps;3 secs   Wall Push Ups 10 reps  (reverse)   Shoulder Exercises: Supine   Horizontal ABduction Strengthening;Both;10 reps;Theraband   Theraband Level (Shoulder Horizontal ABduction) Level 2 (Red)   Other Supine Exercises D2 sash with red band x 10 reps each arm, 2 sets.    Shoulder Exercises: Standing   Extension Strengthening;Both;10 reps;Theraband  2 sets   Theraband Level (Shoulder Extension) Level 2 (Red)   Row 10 reps;Both;Theraband  2 sets   Theraband Level (Shoulder Row) Level 2 (Red)   Modalities   Modalities Electrical Stimulation;Moist Heat   Moist Heat Therapy   Number Minutes Moist Heat 15 Minutes   Moist Heat Location Cervical   Electrical Stimulation   Electrical Stimulation Location Lt cervical musculature; Lt upper trap   Electrical Stimulation Action IFC   Electrical Stimulation Parameters to tolerance    Electrical Stimulation Goals Tone;Pain                     PT Long Term Goals - 04/24/16 1142    PT LONG TERM GOAL #1   Title  Improve posture and alignment in standing and with gait 05/23/16   Time 6   Period Weeks   Status On-going   PT LONG TERM GOAL #2   Title Increase strength bilat UE's to 5-/5 to 5/5 throughout 05/23/16   Time 6   Period Weeks   Status On-going   PT LONG TERM GOAL #3   Title Decrease muscular tightness to palpation and improve tissue extensibility Lt upper quarter 05/23/16   Time 6   Period Weeks   Status On-going   PT LONG TERM GOAL #4   Title I in HEP 05/23/16   Time 6   Period Weeks   Status On-going   PT LONG TERM GOAL #5   Title Improve FOTO to </= 44% limitation 05/23/16   Time 6   Period Weeks   Status On-going   PT LONG TERM GOAL #6   Title demo painfree cervical ROM ( 05/23/16)    Time 6   Period Weeks   Status Partially Met               Plan - 04/24/16 0743    Clinical Impression Statement Pt demonstrated improved cervical ROM; continues to be painful at end range.  Pt  tolerated exercises with increased resistance without increase in symptoms.  Progressing gradually towards goals.    Rehab Potential Good   PT Frequency 2x / week   PT Duration 6 weeks   PT Treatment/Interventions Patient/family education;ADLs/Self Care Home Management;Therapeutic exercise;Therapeutic activities;Neuromuscular re-education;Balance training;Manual techniques;Dry needling;Cryotherapy;Electrical Stimulation;Moist Heat;Ultrasound;Traction   PT Next Visit Plan Continue progressive postural correction and appropriate exercise for cervical dysfunction; further assessment of Lt hip and back pain as MD orders.    Consulted and Agree with Plan of Care Patient      Patient will benefit from skilled therapeutic intervention in order to improve the following deficits and impairments:  Postural dysfunction, Improper body mechanics, Pain, Decreased range of motion, Decreased strength, Increased fascial restricitons, Decreased endurance, Decreased activity tolerance, Abnormal gait  Visit Diagnosis: Abnormal posture  Other symptoms and signs involving the musculoskeletal system  Muscle weakness (generalized)  Cervicalgia     Problem List Patient Active Problem List   Diagnosis Date Noted  . Neck pain 11/17/2015  . Low back pain 11/17/2015   [name], PTA 04/24/2016 11:43 AM  [facility] [place], [place], [address] Phone: [phone]   Fax:  [phone]  Name: Michaela Aguilar MRN: [id number] Date of Birth: 10-15-1966

## 2016-04-26 ENCOUNTER — Ambulatory Visit (INDEPENDENT_AMBULATORY_CARE_PROVIDER_SITE_OTHER): Payer: Self-pay | Admitting: Physical Therapy

## 2016-04-26 DIAGNOSIS — R29898 Other symptoms and signs involving the musculoskeletal system: Secondary | ICD-10-CM

## 2016-04-26 DIAGNOSIS — M6281 Muscle weakness (generalized): Secondary | ICD-10-CM

## 2016-04-26 DIAGNOSIS — M542 Cervicalgia: Secondary | ICD-10-CM

## 2016-04-26 DIAGNOSIS — R293 Abnormal posture: Secondary | ICD-10-CM

## 2016-04-26 NOTE — Patient Instructions (Signed)
Scapula Adduction With Pectorals, Low   Stand in doorframe with palms against frame and arms at 45. Lean forward and squeeze shoulder blades. Hold _30__ seconds. Repeat _2__ times per session.    Scapula Adduction With Pectorals, Mid-Range   Stand in doorframe with palms against frame and arms at 90. Lean forward and squeeze shoulder blades. Hold __30_ seconds. Repeat __2_ times per session. Do _2__ sessions per day.  \Scapula Adduction With Pectorals, High   Stand in doorframe with palms against frame and arms at 120. Lean forward and squeeze shoulder blades. Hold _30__ seconds. Repeat _2__ times per session. Do ___ sessions per day. DO THIS ONE ARM AT A TIME.    [facility] [place], [place] [address]  [phone] (office) [phone] (fax)

## 2016-04-26 NOTE — Therapy (Addendum)
[place], [place], [address] Phone: [phone]   Fax:  [phone]  Physical Therapy Treatment  Patient Details  Name: Michaela Aguilar MRN: [id number] Date of Birth: 10/21/1966 Referring Provider: Dr. Lilia Argue   Encounter Date: 04/26/2016      PT End of Session - 04/26/16 1406    Visit Number 4   Number of Visits 12   Date for PT Re-Evaluation 05/23/16   PT Start Time 1402   PT Stop Time 1446   PT Time Calculation (min) 44 min   Activity Tolerance Patient tolerated treatment well;No increased pain      Past Medical History  Diagnosis Date  . Bronchitis   . Kidney stones per pt.  . Depression   . Anxiety     Past Surgical History  Procedure Laterality Date  . Cesarean section    . Hernia repair    . Extracorporeal shock wave lithotripsy  1995 2010    There were no vitals filed for this visit.      Subjective Assessment - 04/26/16 1406    Subjective Pt reports she visited her daugther.  Was in pool 2 days for a few hours.  Headache is fully resolved.    Currently in Pain? Yes   Pain Score 3    Pain Location Neck   Pain Orientation Left   Pain Descriptors / Indicators Sore            [facility] PT Assessment - 04/26/16 0001    Assessment   Medical Diagnosis Neck pain    Referring Provider Dr. Lilia Argue    Onset Date/Surgical Date 05/19/15   Hand Dominance Right   Next MD Visit as needed (will see PT for ~8 visits and schedule return)           [facility] Adult PT Treatment/Exercise - 04/26/16 0001    Neck Exercises: Machines for Strengthening   UBE (Upper Arm Bike) L2: 2 min each direction    Neck Exercises: Standing   Wall Push Ups 10 reps  (reverse, 3 sec hold)   Neck Exercises: Seated   Other Seated Exercise elbows on thighs:  cervical flexion to neutral and diagonals. 5 reps each direction    Shoulder Exercises: Standing   External Rotation Both;10  reps;Theraband  2 sets, then 2 sets pulsed quickly.    Extension Strengthening;Both;10 reps;Theraband   Theraband Level (Shoulder Extension) Level 2 (Red)   Row Strengthening;Both;10 reps;Theraband   Theraband Level (Shoulder Row) Level 3 (Green)   Shoulder Exercises: Stretch   Other Shoulder Stretches 3 position doorway stretch x 30 sec x 2 reps each    Modalities   Modalities Electrical Stimulation;Moist Heat   Moist Heat Therapy   Number Minutes Moist Heat 15 Minutes   Moist Heat Location Cervical   Electrical Stimulation   Electrical Stimulation Location Lt cervical musculature; Lt upper trap   Electrical Stimulation Action IFC   Electrical Stimulation Parameters to tolerance    Electrical Stimulation Goals Tone;Pain   Manual Therapy   Manual Therapy Taping   Kinesiotex Inhibit Muscle   Kinesiotix   Inhibit Muscle  Rock tape applied to Lt upper trap, levator and paraspinals to increase proprioception and decrease pain.    Neck Exercises: Stretches   Upper Trapezius Stretch 3 reps;20 seconds           PT Long Term Goals - 04/24/16 1142    PT LONG TERM GOAL #1  Title Improve posture and alignment in standing and with gait 05/23/16   Time 6   Period Weeks   Status On-going   PT LONG TERM GOAL #2   Title Increase strength bilat UE's to 5-/5 to 5/5 throughout 05/23/16   Time 6   Period Weeks   Status On-going   PT LONG TERM GOAL #3   Title Decrease muscular tightness to palpation and improve tissue extensibility Lt upper quarter 05/23/16   Time 6   Period Weeks   Status On-going   PT LONG TERM GOAL #4   Title I in HEP 05/23/16   Time 6   Period Weeks   Status On-going   PT LONG TERM GOAL #5   Title Improve FOTO to </= 44% limitation 05/23/16   Time 6   Period Weeks   Status On-going   PT LONG TERM GOAL #6   Title demo painfree cervical ROM ( 05/23/16)    Time 6   Period Weeks   Status Partially Met               Plan - 04/26/16 1431    Clinical  Impression Statement Pt reported decrease in pain, however "tightness" feeling remainded in neck with therapeutic exercise.  Trial of Rock tape applied to neck for pain reduction and increased proprioception.   Progressing gradually towards goals.    Rehab Potential Good   PT Frequency 2x / week   PT Duration 6 weeks   PT Treatment/Interventions Patient/family education;ADLs/Self Care Home Management;Therapeutic exercise;Therapeutic activities;Neuromuscular re-education;Balance training;Manual techniques;Dry needling;Cryotherapy;Electrical Stimulation;Moist Heat;Ultrasound;Traction   PT Next Visit Plan Assess response to [place] tape.  Continue postural strengthening. Assess UE strength.    Consulted and Agree with Plan of Care Patient      Patient will benefit from skilled therapeutic intervention in order to improve the following deficits and impairments:  Postural dysfunction, Improper body mechanics, Pain, Decreased range of motion, Decreased strength, Increased fascial restricitons, Decreased endurance, Decreased activity tolerance, Abnormal gait  Visit Diagnosis: Abnormal posture  Other symptoms and signs involving the musculoskeletal system  Muscle weakness (generalized)  Cervicalgia     Problem List Patient Active Problem List   Diagnosis Date Noted  . Neck pain 11/17/2015  . Low back pain 11/17/2015   [name], PTA 04/26/2016 5:00 PM  [place], [place], [address] Phone: [phone]   Fax:  [phone]  Name: Michaela Aguilar MRN: [id number] Date of Birth: Apr 14, 1966    PHYSICAL THERAPY DISCHARGE SUMMARY  Visits from Start of Care: 4  Current functional level related to goals / functional outcomes: Continued c/o pain and difficulty with functional activities    Remaining deficits: Continued limitations in mobility and function. See progress note for discharge status    Education / Equipment: HEP Plan: Patient agrees to discharge.  Patient goals were partially met. Patient is being discharged due to not returning since the last visit.  ?????   [name] PT, MPH 07/05/16 1:01 PM

## 2016-05-03 ENCOUNTER — Ambulatory Visit (HOSPITAL_COMMUNITY)
Admission: RE | Admit: 2016-05-03 | Discharge: 2016-05-03 | Disposition: A | Discharge status: Home / Self Care | Payer: Self-pay | Source: Ambulatory Visit | Attending: Urology | Admitting: Urology

## 2016-05-03 ENCOUNTER — Other Ambulatory Visit: Payer: Self-pay | Admitting: Urology

## 2016-05-03 ENCOUNTER — Other Ambulatory Visit (HOSPITAL_COMMUNITY)
Admission: RE | Admit: 2016-05-03 | Discharge: 2016-05-03 | Disposition: A | Discharge status: Home / Self Care | Payer: Self-pay | Source: Other Acute Inpatient Hospital | Attending: Urology | Admitting: Urology

## 2016-05-03 ENCOUNTER — Ambulatory Visit (INDEPENDENT_AMBULATORY_CARE_PROVIDER_SITE_OTHER): Payer: Self-pay | Admitting: Urology

## 2016-05-03 DIAGNOSIS — N200 Calculus of kidney: Secondary | ICD-10-CM

## 2016-05-03 DIAGNOSIS — Z9889 Other specified postprocedural states: Secondary | ICD-10-CM | POA: Insufficient documentation

## 2016-05-03 LAB — URINALYSIS, ROUTINE W REFLEX MICROSCOPIC
BILIRUBIN URINE: NEGATIVE
Glucose, UA: NEGATIVE mg/dL
KETONES UR: NEGATIVE mg/dL
Leukocytes, UA: NEGATIVE
NITRITE: NEGATIVE
Protein, ur: NEGATIVE mg/dL
Specific Gravity, Urine: 1.025 (ref 1.005–1.030)
pH: 5 (ref 5.0–8.0)

## 2016-05-03 LAB — URINE MICROSCOPIC-ADD ON
Bacteria, UA: NONE SEEN
WBC UA: NONE SEEN WBC/hpf (ref 0–5)

## 2016-05-04 ENCOUNTER — Encounter: Payer: Self-pay | Admitting: Physical Therapy

## 2016-06-08 ENCOUNTER — Ambulatory Visit (INDEPENDENT_AMBULATORY_CARE_PROVIDER_SITE_OTHER): Payer: Self-pay | Admitting: Sports Medicine

## 2016-06-08 ENCOUNTER — Encounter: Payer: Self-pay | Admitting: Sports Medicine

## 2016-06-08 ENCOUNTER — Other Ambulatory Visit: Payer: Self-pay | Admitting: Sports Medicine

## 2016-06-08 VITALS — BP 117/70 | HR 71 | Ht 65.0 in | Wt 237.0 lb

## 2016-06-08 DIAGNOSIS — M5442 Lumbago with sciatica, left side: Secondary | ICD-10-CM

## 2016-06-08 NOTE — Progress Notes (Signed)
   CC: Neck and left lower back pain  HPI:  [name] is a 50 y.o. woman who presents for follow up management of neck and back pain. Cervical MRI from 05/2015 was motion degraded but did not show any evidence for dis degenration, herniation, mass, or vertebral body abnormality. MRI lumbar spine did show L5-S1 disc protrusion without significant stenosis. On last visit, patient was given IM Depo-Medrol with relief. She also restarted PT although this has been difficulty as she has to drive 1.5 hours for her sessions. She is using a can now as well which helps prevent falls. She has not been applying direct heat to her painful areas, but has been swimming and using a hottub with relief as well. She has tried an OTC sublingual "leg cramping" medicine which she thinks is helpful.  For her neck pain, she has been using dry needling. She has some pain radiating down her left arm to her fingertips. She feels the pain more behind her left mandible and sometimes in the ear.  Her left lower back pain occurs at the buttocks and radiates with a cramping sensation down the back of her leg past her knee to her foot. She has occasional pins and needles sensation and numbness with this.  Past Medical History:  Diagnosis Date  . Anxiety   . Bronchitis   . Depression   . Kidney stones per pt.    Review of Systems:  Review of Systems  Musculoskeletal: Positive for back pain and neck pain. Negative for falls.  Neurological: Positive for tingling. Negative for sensory change and focal weakness.     Physical Exam:  Vitals:   06/08/16 1003  BP: 117/70  Pulse: 71  Weight: 237 lb (107.5 kg)  Height: 5\' 5"  (1.651 m)   MSK: Extension and flexion at the knees intact bilaterally. She has some pain with palpation at the left SI area. Decreased hip abduction on left side against resistance, intact on right. Cramping pain at left buttocks and posterior/lateral left thigh elicited with hip abduction and  flexion. Negative Faber test.   Neuro: Patellar tendon reflexes intact bilaterally, slight diminished achilles tendon reflexes b/l. Straight leg test negative.  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 I agree with the above plan of care. Recent MRI of the patient's cervical spine was fairly unremarkable. However, an MRI of her lumbar spine did show a bulging disc at L5-S1 which may irritate the left exiting S1 nerve root. For diagnostic as well as therapeutic reasons I will refer this patient to [facility] imaging for a lumbar epidural steroid injection. Patient is instructed to follow-up with me one week after the injection. I explained to her that it sometimes takes 2 or 3 epidural injections before her pain will resolve. I've encouraged her to continue with physical therapy as well.

## 2016-06-15 ENCOUNTER — Ambulatory Visit
Admission: RE | Admit: 2016-06-15 | Discharge: 2016-06-15 | Disposition: A | Discharge status: Home / Self Care | Payer: No Typology Code available for payment source | Source: Ambulatory Visit | Attending: Sports Medicine | Admitting: Sports Medicine

## 2016-06-15 DIAGNOSIS — M5442 Lumbago with sciatica, left side: Secondary | ICD-10-CM

## 2016-06-15 MED ORDER — METHYLPREDNISOLONE ACETATE 40 MG/ML INJ SUSP (RADIOLOG
120.0000 mg | Freq: Once | INTRAMUSCULAR | Status: AC
  Administered 2016-06-15: 120 mg via EPIDURAL

## 2016-06-15 MED ORDER — IOPAMIDOL (ISOVUE-M 200) INJECTION 41%
1.0000 mL | Freq: Once | INTRAMUSCULAR | Status: AC
  Administered 2016-06-15: 1 mL via EPIDURAL

## 2016-06-15 NOTE — Discharge Instructions (Signed)

## 2016-07-12 ENCOUNTER — Ambulatory Visit (INDEPENDENT_AMBULATORY_CARE_PROVIDER_SITE_OTHER): Payer: Self-pay | Admitting: Sports Medicine

## 2016-07-12 ENCOUNTER — Other Ambulatory Visit: Payer: Self-pay | Admitting: Sports Medicine

## 2016-07-12 VITALS — BP 128/90 | HR 81 | Ht 65.0 in | Wt 238.0 lb

## 2016-07-12 DIAGNOSIS — M545 Low back pain, unspecified: Secondary | ICD-10-CM

## 2016-07-12 DIAGNOSIS — G8929 Other chronic pain: Secondary | ICD-10-CM

## 2016-07-12 DIAGNOSIS — M5442 Lumbago with sciatica, left side: Secondary | ICD-10-CM

## 2016-07-12 MED ORDER — CYCLOBENZAPRINE HCL 10 MG PO TABS: 10.0000 mg | ORAL_TABLET | Freq: Two times a day (BID) | ORAL | 1 refills | Status: DC | PRN

## 2016-07-12 NOTE — Progress Notes (Signed)
   Subjective:    Patient ID: Michaela Aguilar, female    DOB: 11-22-1965, 50 y.o.   MRN: [id number]  HPI   Patient comes in today for follow-up on low back pain and left leg radiculopathy. She is status post lumbar ESI on the left at L5-S1. She states that she had 4-5 days of symptom relief before her symptoms began to return. She would like to proceed with the second injection. This pain all started after she slipped and fell over a year ago. An MRI of her lumbar spine done on 05/19/2015 did demonstrate a leftward L5-S1 disc protrusion. She is also complaining of cramping in both of her legs. She has had good success with Flexeril in the past for muscle spasms.    Review of Systems     Objective:   Physical Exam  Examination demonstrates limited lumbar range of motion secondary to pain. She has no gross focal neurological deficits of either lower extremity Ambulates with the assistance of a cane      Assessment & Plan:   Low back pain and left leg radiculopathy with MRI evidence of a leftward L5-S1 disc protrusion  Since the patient did get symptom relief for 4-5 days with her first epidural steroid injection, I decided to go ahead and schedule a second injection. She will notify me several days after her second injection to let me know how she is doing. I explained to her that we could order a third injection as well as needed. If she receives a positive yet limited benefit from these injections, then she may benefit from a referral to neurosurgery to see if they think an L5-S1 discectomy is appropriate. I've given her a prescription for Flexeril 10 mg to take twice a day as needed for spasm.

## 2016-07-19 ENCOUNTER — Other Ambulatory Visit: Payer: Self-pay | Admitting: Sports Medicine

## 2016-07-19 ENCOUNTER — Ambulatory Visit
Admission: RE | Admit: 2016-07-19 | Discharge: 2016-07-19 | Disposition: A | Discharge status: Home / Self Care | Payer: No Typology Code available for payment source | Source: Ambulatory Visit | Attending: Sports Medicine | Admitting: Sports Medicine

## 2016-07-19 DIAGNOSIS — M545 Low back pain, unspecified: Secondary | ICD-10-CM

## 2016-07-19 DIAGNOSIS — G8929 Other chronic pain: Secondary | ICD-10-CM

## 2016-07-19 MED ORDER — IOPAMIDOL (ISOVUE-M 200) INJECTION 41%
1.0000 mL | Freq: Once | INTRAMUSCULAR | Status: AC
  Administered 2016-07-19: 1 mL via EPIDURAL

## 2016-07-19 MED ORDER — METHYLPREDNISOLONE ACETATE 40 MG/ML INJ SUSP (RADIOLOG
120.0000 mg | Freq: Once | INTRAMUSCULAR | Status: AC
  Administered 2016-07-19: 120 mg via EPIDURAL

## 2016-07-19 NOTE — Discharge Instructions (Signed)

## 2016-08-01 ENCOUNTER — Emergency Department (HOSPITAL_COMMUNITY)
Admission: EM | Admit: 2016-08-01 | Discharge: 2016-08-01 | Disposition: A | Discharge status: Home / Self Care | Payer: No Typology Code available for payment source | Attending: Emergency Medicine | Admitting: Emergency Medicine

## 2016-08-01 ENCOUNTER — Emergency Department (HOSPITAL_COMMUNITY): Payer: No Typology Code available for payment source

## 2016-08-01 ENCOUNTER — Encounter (HOSPITAL_COMMUNITY): Payer: Self-pay | Admitting: Emergency Medicine

## 2016-08-01 DIAGNOSIS — Z87891 Personal history of nicotine dependence: Secondary | ICD-10-CM | POA: Insufficient documentation

## 2016-08-01 DIAGNOSIS — N390 Urinary tract infection, site not specified: Secondary | ICD-10-CM | POA: Insufficient documentation

## 2016-08-01 DIAGNOSIS — N202 Calculus of kidney with calculus of ureter: Secondary | ICD-10-CM | POA: Insufficient documentation

## 2016-08-01 DIAGNOSIS — R319 Hematuria, unspecified: Secondary | ICD-10-CM

## 2016-08-01 DIAGNOSIS — N23 Unspecified renal colic: Secondary | ICD-10-CM

## 2016-08-01 LAB — BASIC METABOLIC PANEL
Anion gap: 8 (ref 5–15)
BUN: 16 mg/dL (ref 6–20)
CHLORIDE: 105 mmol/L (ref 101–111)
CO2: 25 mmol/L (ref 22–32)
CREATININE: 0.68 mg/dL (ref 0.44–1.00)
Calcium: 9.6 mg/dL (ref 8.9–10.3)
GFR calc Af Amer: >60 mL/min (ref >60)
Glucose, Bld: 171 mg/dL — ABNORMAL HIGH (ref 65–99)
Potassium: 4.1 mmol/L (ref 3.5–5.1)
SODIUM: 138 mmol/L (ref 135–145)

## 2016-08-01 LAB — CBC WITH DIFFERENTIAL/PLATELET
Basophils Absolute: 0 10*3/uL (ref 0.0–0.1)
Basophils Relative: 0 %
EOS ABS: 0.1 10*3/uL (ref 0.0–0.7)
EOS PCT: 1 %
HCT: 39.2 % (ref 36.0–46.0)
Hemoglobin: 13.2 g/dL (ref 12.0–15.0)
LYMPHS ABS: 3.3 10*3/uL (ref 0.7–4.0)
Lymphocytes Relative: 23 %
MCH: 32.1 pg (ref 26.0–34.0)
MCHC: 33.7 g/dL (ref 30.0–36.0)
MCV: 95.4 fL (ref 78.0–100.0)
MONO ABS: 1.3 10*3/uL — AB (ref 0.1–1.0)
Monocytes Relative: 9 %
Neutro Abs: 9.3 10*3/uL — ABNORMAL HIGH (ref 1.7–7.7)
Neutrophils Relative %: 67 %
PLATELETS: 281 10*3/uL (ref 150–400)
RBC: 4.11 MIL/uL (ref 3.87–5.11)
RDW: 11.9 % (ref 11.5–15.5)
WBC: 14 10*3/uL — AB (ref 4.0–10.5)

## 2016-08-01 LAB — URINALYSIS, ROUTINE W REFLEX MICROSCOPIC
BILIRUBIN URINE: NEGATIVE
Glucose, UA: NEGATIVE mg/dL
KETONES UR: NEGATIVE mg/dL
Leukocytes, UA: NEGATIVE
Nitrite: NEGATIVE
PROTEIN: NEGATIVE mg/dL
Specific Gravity, Urine: 1.025 (ref 1.005–1.030)
pH: 6 (ref 5.0–8.0)

## 2016-08-01 LAB — URINE MICROSCOPIC-ADD ON

## 2016-08-01 MED ORDER — CEPHALEXIN 500 MG PO CAPS: 500.0000 mg | ORAL_CAPSULE | Freq: Four times a day (QID) | ORAL | 0 refills | Status: DC

## 2016-08-01 MED ORDER — SODIUM CHLORIDE 0.9 % IV SOLN: 1000.0000 mL | Freq: Once | INTRAVENOUS | Status: DC

## 2016-08-01 MED ORDER — OXYCODONE-ACETAMINOPHEN 5-325 MG PO TABS: 1.0000 | ORAL_TABLET | ORAL | 0 refills | Status: DC | PRN

## 2016-08-01 MED ORDER — SODIUM CHLORIDE 0.9 % IV SOLN
1000.0000 mL | INTRAVENOUS | Status: DC
  Administered 2016-08-01 (×2): 1000 mL via INTRAVENOUS

## 2016-08-01 MED ORDER — HYDROMORPHONE HCL 1 MG/ML IJ SOLN
1.0000 mg | Freq: Once | INTRAMUSCULAR | Status: AC
  Administered 2016-08-01: 1 mg via INTRAVENOUS
  Filled 2016-08-01: qty 1

## 2016-08-01 MED ORDER — ONDANSETRON HCL 4 MG PO TABS: 4.0000 mg | ORAL_TABLET | Freq: Four times a day (QID) | ORAL | 0 refills | Status: DC | PRN

## 2016-08-01 MED ORDER — CEFTRIAXONE SODIUM 1 G IJ SOLR
1.0000 g | Freq: Once | INTRAMUSCULAR | Status: AC
  Administered 2016-08-01: 1 g via INTRAVENOUS
  Filled 2016-08-01: qty 10

## 2016-08-01 MED ORDER — ONDANSETRON HCL 4 MG/2ML IJ SOLN
4.0000 mg | Freq: Once | INTRAMUSCULAR | Status: AC
  Administered 2016-08-01: 4 mg via INTRAVENOUS
  Filled 2016-08-01: qty 2

## 2016-08-01 MED ORDER — KETOROLAC TROMETHAMINE 30 MG/ML IJ SOLN
30.0000 mg | Freq: Once | INTRAMUSCULAR | Status: AC
  Administered 2016-08-01: 30 mg via INTRAVENOUS
  Filled 2016-08-01: qty 1

## 2016-08-01 NOTE — ED Provider Notes (Signed)
[facility] DEPT Provider Note   CSN: [id number] Arrival date & time: 08/01/16  0058     History   Chief Complaint Chief Complaint  Patient presents with  . Flank Pain    HPI Michaela Aguilar is a 50 y.o. female. She has a history of bronchitis, depression, kidney stones. She had onset yesterday of right flank pain which has been getting progressively worse. Pain is now radiating down to the right lower quadrant. This is typical of her kidney stones. She did start taking tamsulosin. She is scheduled to see her urologist this week. She currently rates pain at 10/10. There is associated nausea but no vomiting. Nothing makes pain better nothing makes it worse. She denies fever or chills. She denies urinary urgency, frequency, hesitancy.  The history is provided by the patient.  Flank Pain     Past Medical History:  Diagnosis Date  . Anxiety   . Bronchitis   . Depression   . Kidney stones per pt.    Patient Active Problem List   Diagnosis Date Noted  . Neck pain 11/17/2015  . Low back pain 11/17/2015    Past Surgical History:  Procedure Laterality Date  . CESAREAN SECTION    . [place] 2010  . HERNIA REPAIR      OB History    No data available       Home Medications    Prior to Admission medications   Medication Sig Start Date End Date Taking? Authorizing Provider  ALPRAZolam Duanne Moron) 1 MG tablet Take 1 mg by mouth 2 (two) times daily as needed for anxiety. For pain    Historical Provider, MD  cyclobenzaprine (FLEXERIL) 10 MG tablet Take 1 tablet (10 mg total) by mouth 2 (two) times daily as needed for muscle spasms. 07/12/16   Thurman Coyer, DO    Family History No family history on file.  Social History Social History  Substance Use Topics  . Smoking status: Former Smoker    Quit date: 08/08/2013  . Smokeless tobacco: Never Used  . Alcohol use No     Allergies   Review of patient's allergies indicates no known  allergies.   Review of Systems Review of Systems  Genitourinary: Positive for flank pain.  All other systems reviewed and are negative.    Physical Exam Updated Vital Signs BP 107/95 (BP Location: Left Arm)   Pulse 67   Temp 97.7 F (36.5 C) (Oral)   Resp 22   Ht 5\' 5"  (1.651 m)   Wt 235 lb (106.6 kg)   LMP 02/16/2014   SpO2 98%   BMI 39.11 kg/m   Physical Exam  Nursing note and vitals reviewed.  50 year old female, restless and very uncomfortable, but is in no acute distress. Vital signs are normal. Oxygen saturation is 98%, which is normal. Head is normocephalic and atraumatic. PERRLA, EOMI. Oropharynx is clear. Neck is nontender and supple without adenopathy or JVD. Back is nontender in the midline. There is moderate right CVA tenderness. Lungs are clear without rales, wheezes, or rhonchi. Chest is nontender. Heart has regular rate and rhythm without murmur. Abdomen is soft, flat, with right lower quadrant tenderness. There are no masses or hepatosplenomegaly and peristalsis is hypoactive. Extremities have no cyanosis or edema, full range of motion is present. Skin is warm and dry without rash. Neurologic: Mental status is normal, cranial nerves are intact, there are no motor or sensory deficits.  ED Treatments /  Results  Labs (all labs ordered are listed, but only abnormal results are displayed) Labs Reviewed  URINALYSIS, ROUTINE W REFLEX MICROSCOPIC (NOT AT [facility]) - Abnormal; Notable for the following:       Result Value   Hgb urine dipstick SMALL (*)    All other components within normal limits  BASIC METABOLIC PANEL - Abnormal; Notable for the following:    Glucose, Bld 171 (*)    All other components within normal limits  CBC WITH DIFFERENTIAL/PLATELET - Abnormal; Notable for the following:    WBC 14.0 (*)    Neutro Abs 9.3 (*)    Monocytes Absolute 1.3 (*)    All other components within normal limits  URINE MICROSCOPIC-ADD ON - Abnormal; Notable for the  following:    Squamous Epithelial / LPF 6-30 (*)    Bacteria, UA MANY (*)    Crystals CA OXALATE CRYSTALS (*)    All other components within normal limits  URINE CULTURE    Radiology Dg Abdomen 1 View  Result Date: 08/01/2016 CLINICAL DATA:  Right flank pain increasing since Sunday. Previous history of kidney stones. Lithotripsy 3 months ago. EXAM: ABDOMEN - 1 VIEW COMPARISON:  05/03/2016 FINDINGS: Stool-filled colon obscures visualization of the kidneys. No small or large bowel distention. Calcifications projected over the lower pole of the right kidney consistent with residual stones. Suggestion of the stone projected over the right L3 transverse process may represent a ureteral stone at the ureteropelvic junction. Calcifications in the pelvis are consistent with phleboliths. IMPRESSION: Stones projected over the right kidney and proximal right ureter. Electronically Signed   By: Lucienne Capers M.D.   On: 08/01/2016 02:13    Procedures Procedures (including critical care time)  Medications Ordered in ED Medications  0.9 %  sodium chloride infusion (not administered)    Followed by  0.9 %  sodium chloride infusion (1,000 mLs Intravenous New Bag/Given 08/01/16 0236)  ketorolac (TORADOL) 30 MG/ML injection 30 mg (30 mg Intravenous Given 08/01/16 0153)  HYDROmorphone (DILAUDID) injection 1 mg (1 mg Intravenous Given 08/01/16 0148)  ondansetron (ZOFRAN) injection 4 mg (4 mg Intravenous Given 08/01/16 0149)  cefTRIAXone (ROCEPHIN) 1 g in dextrose 5 % 50 mL IVPB (0 g Intravenous Stopped 08/01/16 0301)  HYDROmorphone (DILAUDID) injection 1 mg (1 mg Intravenous Given 08/01/16 0235)     Initial Impression / Assessment and Plan / ED Course  I have reviewed the triage vital signs and the nursing notes.  Pertinent labs & imaging results that were available during my care of the patient were reviewed by me and considered in my medical decision making (see chart for details).  Clinical Course    Right flank pain consistent with ureteral colic. Old records are reviewed, and renal stone CT in January showed residual 60mm right renal calculus as well as another 7 mm calculus and a 2 mm calculus. KUB in June showed stable appearance of the calculi. She will be sent for another KUB tonight to see if any of the calculi have moved. She has 10 CT scans her abdomen and pelvis, will try to avoid additional CT scan today.  KUB shows that one of the calculi which had been in the right kidney has shifted to the region of the right renal pelvis, and this is apparently the culprit stone. Urinalysis does show significant bacteriuria, so she is started on antibiotics and given a dose of ceftriaxone. She required 2 doses of hydromorphone to get pain relief, but she is now resting  comfortably. She has an appointment with her urologist tomorrow which she is to keep. She is discharged with prescriptions for ondansetron and oxycodone-acetaminophen. Also given prescription for cephalexin for infection. Advised return immediately should she develop a fever.  Final Clinical Impressions(s) / ED Diagnoses   Final diagnoses:  Renal colic on right side  Urinary tract infection with hematuria, site unspecified    New Prescriptions New Prescriptions   CEPHALEXIN (KEFLEX) 500 MG CAPSULE    Take 1 capsule (500 mg total) by mouth 4 (four) times daily.   ONDANSETRON (ZOFRAN) 4 MG TABLET    Take 1 tablet (4 mg total) by mouth every 6 (six) hours as needed.   ONDANSETRON (ZOFRAN) 4 MG TABLET    Take 1 tablet (4 mg total) by mouth every 6 (six) hours as needed.   OXYCODONE-ACETAMINOPHEN (PERCOCET) 5-325 MG TABLET    Take 1 tablet by mouth every 4 (four) hours as needed for moderate pain.   OXYCODONE-ACETAMINOPHEN (PERCOCET) 5-325 MG TABLET    Take 1-2 tablets by mouth every 4 (four) hours as needed for moderate pain.     Delora Fuel, MD [id number]

## 2016-08-01 NOTE — ED Notes (Addendum)
Resting quietly, with friend at bedside. Lights dimmed for comfort

## 2016-08-01 NOTE — ED Notes (Signed)
To and from Radiology

## 2016-08-01 NOTE — ED Notes (Signed)
Patient given Ginger ale to drink at this time.

## 2016-08-01 NOTE — Discharge Instructions (Signed)
If you develop a fever, return immediately!

## 2016-08-02 ENCOUNTER — Ambulatory Visit (HOSPITAL_COMMUNITY)
Admission: RE | Admit: 2016-08-02 | Discharge: 2016-08-02 | Disposition: A | Discharge status: Home / Self Care | Payer: No Typology Code available for payment source | Source: Ambulatory Visit | Attending: Urology | Admitting: Urology

## 2016-08-02 ENCOUNTER — Other Ambulatory Visit: Payer: Self-pay | Admitting: Urology

## 2016-08-02 DIAGNOSIS — N209 Urinary calculus, unspecified: Secondary | ICD-10-CM | POA: Insufficient documentation

## 2016-08-02 DIAGNOSIS — N200 Calculus of kidney: Secondary | ICD-10-CM

## 2016-08-02 LAB — URINE CULTURE

## 2016-08-07 ENCOUNTER — Other Ambulatory Visit: Payer: Self-pay | Admitting: *Deleted

## 2016-08-07 ENCOUNTER — Other Ambulatory Visit: Payer: Self-pay | Admitting: Sports Medicine

## 2016-08-07 DIAGNOSIS — M5442 Lumbago with sciatica, left side: Secondary | ICD-10-CM

## 2016-08-09 ENCOUNTER — Ambulatory Visit
Admission: RE | Admit: 2016-08-09 | Discharge: 2016-08-09 | Disposition: A | Discharge status: Home / Self Care | Payer: No Typology Code available for payment source | Source: Ambulatory Visit | Attending: Sports Medicine | Admitting: Sports Medicine

## 2016-08-09 DIAGNOSIS — M5442 Lumbago with sciatica, left side: Secondary | ICD-10-CM

## 2016-08-09 MED ORDER — METHYLPREDNISOLONE ACETATE 40 MG/ML INJ SUSP (RADIOLOG
120.0000 mg | Freq: Once | INTRAMUSCULAR | Status: AC
  Administered 2016-08-09: 120 mg via EPIDURAL

## 2016-08-09 MED ORDER — IOPAMIDOL (ISOVUE-M 200) INJECTION 41%
1.0000 mL | Freq: Once | INTRAMUSCULAR | Status: AC
  Administered 2016-08-09: 1 mL via EPIDURAL

## 2016-08-09 MED FILL — Ondansetron HCl Tab 4 MG: ORAL | Qty: 4 | Status: AC

## 2016-08-09 MED FILL — Oxycodone w/ Acetaminophen Tab 5-325 MG: ORAL | Qty: 6 | Status: AC

## 2016-08-23 ENCOUNTER — Ambulatory Visit (INDEPENDENT_AMBULATORY_CARE_PROVIDER_SITE_OTHER): Payer: Self-pay | Admitting: Urology

## 2016-08-23 DIAGNOSIS — N200 Calculus of kidney: Secondary | ICD-10-CM

## 2016-08-31 ENCOUNTER — Other Ambulatory Visit: Payer: Self-pay | Admitting: Radiology

## 2016-08-31 DIAGNOSIS — N200 Calculus of kidney: Secondary | ICD-10-CM

## 2016-09-05 ENCOUNTER — Encounter (HOSPITAL_COMMUNITY): Payer: Self-pay

## 2016-09-05 ENCOUNTER — Encounter (HOSPITAL_COMMUNITY)
Admission: RE | Admit: 2016-09-05 | Discharge: 2016-09-05 | Disposition: A | Discharge status: Home / Self Care | Payer: Self-pay | Source: Ambulatory Visit | Attending: Urology | Admitting: Urology

## 2016-09-05 DIAGNOSIS — N200 Calculus of kidney: Secondary | ICD-10-CM | POA: Insufficient documentation

## 2016-09-05 DIAGNOSIS — Z01818 Encounter for other preprocedural examination: Secondary | ICD-10-CM | POA: Insufficient documentation

## 2016-09-05 HISTORY — DX: Personal history of urinary calculi: Z87.442

## 2016-09-05 NOTE — Patient Instructions (Signed)
Michaela Aguilar  09/05/2016     @PREFPERIOPPHARMACY @   Your procedure is scheduled on  09/06/2016   Report to [facility] at  4  A.M.  Call this number if you have problems the morning of surgery:  [phone]   Remember:  Do not eat food or drink liquids after midnight.  Take these medicines the morning of surgery with A SIP OF WATER  Xanax, zofran or phenergan, oxycodone.   Do not wear jewelry, make-up or nail polish.  Do not wear lotions, powders, or perfumes, or deoderant.  Do not shave 48 hours prior to surgery.  Men may shave face and neck.  Do not bring valuables to the hospital.  [facility] is not responsible for any belongings or valuables.  Contacts, dentures or bridgework may not be worn into surgery.  Leave your suitcase in the car.  After surgery it may be brought to your room.  For patients admitted to the hospital, discharge time will be determined by your treatment team.  Patients discharged the day of surgery will not be allowed to drive home.   Name and phone number of your driver:   family Special instructions:  none  Please read over the following fact sheets that you were given. Anesthesia Post-op Instructions and Care and Recovery After Surgery       Cystoscopy Cystoscopy is a procedure that is used to help your caregiver diagnose and sometimes treat conditions that affect your lower urinary tract. Your lower urinary tract includes your bladder and the tube through which urine passes from your bladder out of your body (urethra). Cystoscopy is performed with a thin, tube-shaped instrument (cystoscope). The cystoscope has lenses and a light at the end so that your caregiver can see inside your bladder. The cystoscope is inserted at the entrance of your urethra. Your caregiver guides it through your urethra and into your bladder. There are two main types of cystoscopy:  Flexible cystoscopy (with a flexible cystoscope).  Rigid cystoscopy (with  a rigid cystoscope). Cystoscopy may be recommended for many conditions, including:  Urinary tract infections.  Blood in your urine (hematuria).  Loss of bladder control (urinary incontinence) or overactive bladder.  Unusual cells found in a urine sample.  Urinary blockage.  Painful urination. Cystoscopy may also be done to remove a sample of your tissue to be checked under a microscope (biopsy). It may also be done to remove or destroy bladder stones. LET YOUR CAREGIVER KNOW ABOUT:  Allergies to food or medicine.  Medicines taken, including vitamins, herbs, eyedrops, over-the-counter medicines, and creams.  Use of steroids (by mouth or creams).  Previous problems with anesthetics or numbing medicines.  History of bleeding problems or blood clots.  Previous surgery.  Other health problems, including diabetes and kidney problems.  Possibility of pregnancy, if this applies. PROCEDURE The area around the opening to your urethra will be cleaned. A medicine to numb your urethra (local anesthetic) is used. If a tissue sample or stone is removed during the procedure, you may be given a medicine to make you sleep (general anesthetic). Your caregiver will gently insert the tip of the cystoscope into your urethra. The cystoscope will be slowly glided through your urethra and into your bladder. Sterile fluid will flow through the cystoscope and into your bladder. The fluid will expand and stretch your bladder. This gives your caregiver a better view of your bladder walls. The procedure lasts about 15-20 minutes.  AFTER THE PROCEDURE If a local anesthetic is used, you will be allowed to go home as soon as you are ready. If a general anesthetic is used, you will be taken to a recovery area until you are stable. You may have temporary bleeding and burning on urination.   This information is not intended to replace advice given to you by your health care provider. Make sure you discuss any  questions you have with your health care provider.   Document Released: 10/27/2000 Document Revised: 11/20/2014 Document Reviewed: 04/22/2012 Elsevier Interactive Patient Education 2016 [employer]. Cystoscopy, Care After Refer to this sheet in the next few weeks. These instructions provide you with information on caring for yourself after your procedure. Your caregiver may also give you more specific instructions. Your treatment has been planned according to current medical practices, but problems sometimes occur. Call your caregiver if you have any problems or questions after your procedure. HOME CARE INSTRUCTIONS  Things you can do to ease any discomfort after your procedure include:  Drinking enough water and fluids to keep your urine clear or pale yellow.  Taking a warm bath to relieve any burning feelings. SEEK IMMEDIATE MEDICAL CARE IF:   You have an increase in blood in your urine.  You notice blood clots in your urine.  You have difficulty passing urine.  You have the chills.  You have abdominal pain.  You have a fever or persistent symptoms for more than 2-3 days.  You have a fever and your symptoms suddenly get worse. MAKE SURE YOU:   Understand these instructions.  Will watch your condition.  Will get help right away if you are not doing well or get worse.   This information is not intended to replace advice given to you by your health care provider. Make sure you discuss any questions you have with your health care provider.   Document Released: 05/19/2005 Document Revised: 11/20/2014 Document Reviewed: 04/22/2012 Elsevier Interactive Patient Education 2016 [employer] PATIENT INSTRUCTIONS POST-ANESTHESIA  IMMEDIATELY FOLLOWING SURGERY:  Do not drive or operate machinery for the first twenty four hours after surgery.  Do not make any important decisions for twenty four hours after surgery or while taking narcotic pain medications or sedatives.  If you  develop intractable nausea and vomiting or a severe headache please notify your doctor immediately.  FOLLOW-UP:  Please make an appointment with your surgeon as instructed. You do not need to follow up with anesthesia unless specifically instructed to do so.  WOUND CARE INSTRUCTIONS (if applicable):  Keep a dry clean dressing on the anesthesia/puncture wound site if there is drainage.  Once the wound has quit draining you may leave it open to air.  Generally you should leave the bandage intact for twenty four hours unless there is drainage.  If the epidural site drains for more than 36-48 hours please call the anesthesia department.  QUESTIONS?:  Please feel free to call your physician or the hospital operator if you have any questions, and they will be happy to assist you.

## 2016-09-06 ENCOUNTER — Encounter (HOSPITAL_COMMUNITY)
Admission: RE | Disposition: A | Discharge status: Home / Self Care | Payer: Self-pay | Source: Ambulatory Visit | Attending: Urology

## 2016-09-06 ENCOUNTER — Ambulatory Visit (HOSPITAL_COMMUNITY)
Admission: RE | Admit: 2016-09-06 | Discharge: 2016-09-06 | Disposition: A | Discharge status: Home / Self Care | Payer: Self-pay | Source: Ambulatory Visit | Attending: Urology | Admitting: Urology

## 2016-09-06 ENCOUNTER — Ambulatory Visit (HOSPITAL_COMMUNITY): Payer: Self-pay

## 2016-09-06 ENCOUNTER — Ambulatory Visit (HOSPITAL_COMMUNITY): Payer: Self-pay | Admitting: Anesthesiology

## 2016-09-06 ENCOUNTER — Encounter (HOSPITAL_COMMUNITY): Payer: Self-pay | Admitting: *Deleted

## 2016-09-06 DIAGNOSIS — F329 Major depressive disorder, single episode, unspecified: Secondary | ICD-10-CM | POA: Insufficient documentation

## 2016-09-06 DIAGNOSIS — N132 Hydronephrosis with renal and ureteral calculous obstruction: Secondary | ICD-10-CM | POA: Insufficient documentation

## 2016-09-06 DIAGNOSIS — N200 Calculus of kidney: Secondary | ICD-10-CM

## 2016-09-06 DIAGNOSIS — Z87891 Personal history of nicotine dependence: Secondary | ICD-10-CM | POA: Insufficient documentation

## 2016-09-06 DIAGNOSIS — Z87442 Personal history of urinary calculi: Secondary | ICD-10-CM | POA: Insufficient documentation

## 2016-09-06 DIAGNOSIS — F419 Anxiety disorder, unspecified: Secondary | ICD-10-CM | POA: Insufficient documentation

## 2016-09-06 HISTORY — PX: CYSTOSCOPY WITH STENT PLACEMENT: SHX5790

## 2016-09-06 HISTORY — PX: CYSTOSCOPY/RETROGRADE/URETEROSCOPY/STONE EXTRACTION WITH BASKET: SHX5317

## 2016-09-06 LAB — GLUCOSE, CAPILLARY: GLUCOSE-CAPILLARY: 131 mg/dL — AB (ref 65–99)

## 2016-09-06 SURGERY — CYSTOSCOPY, WITH CALCULUS REMOVAL USING BASKET
Anesthesia: General | Laterality: Right

## 2016-09-06 MED ORDER — GLYCOPYRROLATE 0.2 MG/ML IJ SOLN
INTRAMUSCULAR | Status: DC | PRN
  Administered 2016-09-06: 0.2 mg via INTRAVENOUS

## 2016-09-06 MED ORDER — LIDOCAINE HCL (PF) 1 % IJ SOLN
INTRAMUSCULAR | Status: AC
  Filled 2016-09-06: qty 5

## 2016-09-06 MED ORDER — OXYCODONE-ACETAMINOPHEN 5-325 MG PO TABS: 1.0000 | ORAL_TABLET | ORAL | 0 refills | Status: DC | PRN

## 2016-09-06 MED ORDER — DIATRIZOATE MEGLUMINE 30 % UR SOLN
URETHRAL | Status: AC
  Filled 2016-09-06: qty 300

## 2016-09-06 MED ORDER — MIDAZOLAM HCL 2 MG/2ML IJ SOLN
1.0000 mg | INTRAMUSCULAR | Status: DC | PRN
  Administered 2016-09-06 (×3): 2 mg via INTRAVENOUS
  Filled 2016-09-06 (×2): qty 2

## 2016-09-06 MED ORDER — FENTANYL CITRATE (PF) 100 MCG/2ML IJ SOLN
INTRAMUSCULAR | Status: DC | PRN
Start: 2016-09-06 — End: 2016-09-06
  Administered 2016-09-06: 50 ug via INTRAVENOUS

## 2016-09-06 MED ORDER — STERILE WATER FOR IRRIGATION IR SOLN
Status: DC | PRN
  Administered 2016-09-06: 500 mL

## 2016-09-06 MED ORDER — ONDANSETRON HCL 4 MG/2ML IJ SOLN
INTRAMUSCULAR | Status: AC
  Filled 2016-09-06: qty 2

## 2016-09-06 MED ORDER — PROMETHAZINE HCL 25 MG/ML IJ SOLN
6.2500 mg | Freq: Once | INTRAMUSCULAR | Status: AC
  Administered 2016-09-06: 6.25 mg via INTRAVENOUS

## 2016-09-06 MED ORDER — SODIUM CHLORIDE 0.9 % IR SOLN
Status: DC | PRN
  Administered 2016-09-06 (×2): 3000 mL

## 2016-09-06 MED ORDER — PROPOFOL 10 MG/ML IV BOLUS
INTRAVENOUS | Status: DC | PRN
  Administered 2016-09-06: 250 mg via INTRAVENOUS

## 2016-09-06 MED ORDER — FENTANYL CITRATE (PF) 100 MCG/2ML IJ SOLN
INTRAMUSCULAR | Status: AC
  Filled 2016-09-06: qty 2

## 2016-09-06 MED ORDER — PROMETHAZINE HCL 12.5 MG PO TABS: 12.5000 mg | ORAL_TABLET | Freq: Four times a day (QID) | ORAL | 0 refills | Status: DC | PRN

## 2016-09-06 MED ORDER — DIATRIZOATE MEGLUMINE 30 % UR SOLN
URETHRAL | Status: DC | PRN
  Administered 2016-09-06: 7 mL

## 2016-09-06 MED ORDER — GLYCOPYRROLATE 0.2 MG/ML IJ SOLN
INTRAMUSCULAR | Status: AC
Start: 2016-09-06 — End: 2016-09-06
  Filled 2016-09-06: qty 1

## 2016-09-06 MED ORDER — ONDANSETRON HCL 4 MG/2ML IJ SOLN
4.0000 mg | Freq: Once | INTRAMUSCULAR | Status: AC
  Administered 2016-09-06: 4 mg via INTRAVENOUS

## 2016-09-06 MED ORDER — PROMETHAZINE HCL 25 MG/ML IJ SOLN
INTRAMUSCULAR | Status: AC
  Filled 2016-09-06: qty 1

## 2016-09-06 MED ORDER — SODIUM CHLORIDE 0.9% FLUSH
INTRAVENOUS | Status: AC
  Filled 2016-09-06: qty 10

## 2016-09-06 MED ORDER — HYDROMORPHONE HCL 1 MG/ML IJ SOLN
0.2500 mg | INTRAMUSCULAR | Status: DC | PRN
  Administered 2016-09-06 (×2): 0.5 mg via INTRAVENOUS
  Filled 2016-09-06 (×2): qty 0.5

## 2016-09-06 MED ORDER — SULFAMETHOXAZOLE-TRIMETHOPRIM 800-160 MG PO TABS: 1.0000 | ORAL_TABLET | Freq: Two times a day (BID) | ORAL | 0 refills | Status: DC

## 2016-09-06 MED ORDER — FENTANYL CITRATE (PF) 100 MCG/2ML IJ SOLN
25.0000 ug | INTRAMUSCULAR | Status: AC | PRN
  Administered 2016-09-06 (×2): 25 ug via INTRAVENOUS

## 2016-09-06 MED ORDER — PROPOFOL 10 MG/ML IV BOLUS
INTRAVENOUS | Status: AC
  Filled 2016-09-06: qty 20

## 2016-09-06 MED ORDER — MIDAZOLAM HCL 2 MG/2ML IJ SOLN
INTRAMUSCULAR | Status: AC
  Filled 2016-09-06: qty 2

## 2016-09-06 MED ORDER — DEXAMETHASONE SODIUM PHOSPHATE 4 MG/ML IJ SOLN
4.0000 mg | INTRAMUSCULAR | Status: AC
  Administered 2016-09-06: 4 mg via INTRAVENOUS

## 2016-09-06 MED ORDER — LACTATED RINGERS IV SOLN
INTRAVENOUS | Status: DC
  Administered 2016-09-06: 13:00:00 via INTRAVENOUS
  Administered 2016-09-06: 1000 mL via INTRAVENOUS

## 2016-09-06 MED ORDER — DEXAMETHASONE SODIUM PHOSPHATE 4 MG/ML IJ SOLN
INTRAMUSCULAR | Status: AC
  Filled 2016-09-06: qty 1

## 2016-09-06 MED ORDER — CEFAZOLIN IN D5W 1 GM/50ML IV SOLN
1.0000 g | INTRAVENOUS | Status: AC
  Administered 2016-09-06: 1 g via INTRAVENOUS
  Filled 2016-09-06: qty 50

## 2016-09-06 SURGICAL SUPPLY — 26 items
BAG DRAIN URO TABLE W/ADPT NS (DRAPE) ×3 IMPLANT
BAG DRN 8 ADPR NS SKTRN CSTL (DRAPE) ×1
BAG HAMPER (MISCELLANEOUS) ×3 IMPLANT
CATH INTERMIT  6FR 70CM (CATHETERS) ×3 IMPLANT
CLOTH BEACON ORANGE TIMEOUT ST (SAFETY) ×3 IMPLANT
EXTRACTOR STONE NITINOL NGAGE (UROLOGICAL SUPPLIES) ×3 IMPLANT
FIBER LASER FLEXIVA 200 (UROLOGICAL SUPPLIES) ×3 IMPLANT
GLOVE BIO SURGEON STRL SZ8 (GLOVE) ×3 IMPLANT
GLOVE BIOGEL M 8.0 STRL (GLOVE) ×3 IMPLANT
GLOVE BIOGEL PI IND STRL 6.5 (GLOVE) IMPLANT
GLOVE BIOGEL PI IND STRL 7.0 (GLOVE) IMPLANT
GLOVE BIOGEL PI INDICATOR 6.5 (GLOVE) ×2
GLOVE BIOGEL PI INDICATOR 7.0 (GLOVE) ×4
GOWN STRL REUS W/ TWL LRG LVL3 (GOWN DISPOSABLE) ×1 IMPLANT
GOWN STRL REUS W/ TWL XL LVL3 (GOWN DISPOSABLE) ×1 IMPLANT
GOWN STRL REUS W/TWL LRG LVL3 (GOWN DISPOSABLE) ×9 IMPLANT
GOWN STRL REUS W/TWL XL LVL3 (GOWN DISPOSABLE) ×3
GUIDEWIRE STR DUAL SENSOR (WIRE) ×3 IMPLANT
GUIDEWIRE STR ZIPWIRE 035X150 (MISCELLANEOUS) ×3 IMPLANT
IV NS IRRIG 3000ML ARTHROMATIC (IV SOLUTION) ×6 IMPLANT
KIT ROOM TURNOVER AP CYSTO (KITS) ×3 IMPLANT
MANIFOLD NEPTUNE II (INSTRUMENTS) ×3 IMPLANT
PACK CYSTO (CUSTOM PROCEDURE TRAY) ×3 IMPLANT
PAD ARMBOARD 7.5X6 YLW CONV (MISCELLANEOUS) ×3 IMPLANT
STENT URET 6FRX26 CONTOUR (STENTS) ×2 IMPLANT
SYRINGE 10CC LL (SYRINGE) ×3 IMPLANT

## 2016-09-06 NOTE — Progress Notes (Signed)
Wasted 1 CC fentanyl in sink.

## 2016-09-06 NOTE — Anesthesia Preprocedure Evaluation (Signed)
Anesthesia Evaluation  Patient identified by MRN, date of birth, ID band Patient awake    Reviewed: Allergy & Precautions, NPO status , Patient's Chart, lab work & pertinent test results  History of Anesthesia Complications (+) history of anesthetic complications (severe sore throat with prior surgery in [place]. Unable to locate records.)  Airway Mallampati: I  TM Distance: >3 FB     Dental  (+) Missing, Teeth Intact   Pulmonary former smoker,    breath sounds clear to auscultation       Cardiovascular negative cardio ROS   Rhythm:Regular Rate:Normal     Neuro/Psych PSYCHIATRIC DISORDERS Anxiety Depression    GI/Hepatic negative GI ROS,   Endo/Other    Renal/GU      Musculoskeletal   Abdominal   Peds  Hematology   Anesthesia Other Findings   Reproductive/Obstetrics                             Anesthesia Physical Anesthesia Plan  ASA: II  Anesthesia Plan: General   Post-op Pain Management:    Induction: Intravenous  Airway Management Planned: LMA  Additional Equipment:   Intra-op Plan:   Post-operative Plan: Extubation in OR  Informed Consent: I have reviewed the patients History and Physical, chart, labs and discussed the procedure including the risks, benefits and alternatives for the proposed anesthesia with the patient or authorized representative who has indicated his/her understanding and acceptance.     Plan Discussed with:   Anesthesia Plan Comments: (Start with LMA#3 due to anesthesia hx.)        Anesthesia Quick Evaluation

## 2016-09-06 NOTE — Op Note (Signed)
Preoperative diagnosis: Right renal stone  Postoperative diagnosis: Same  Procedure: 1 cystoscopy 2 right retrograde pyelography 3.  Intraoperative fluoroscopy, under one hour, with interpretation 4.  Right ureteroscopic stone manipulation with laser lithotripsy 5.  Right 6 x 26 JJ stent placement  Attending: Rosie Fate  Anesthesia: General  Estimated blood loss: None  Drains: Right 6 x 26 JJ ureteral stent with tether  Specimens: stone for analysis  Antibiotics: ancef  Findings: Right UPJ stone. Mild hydronephrosis. No masses/lesions in the bladder. Ureteral orifices in normal anatomic location.  Indications: Patient is a 50 year old female with a history of right renal stone and who has persistent right flank pain.  After discussing treatment options, she decided proceed with right ureteroscopic stone manipulation.  Procedure her in detail: The patient was brought to the operating room and a brief timeout was done to ensure correct patient, correct procedure, correct site.  General anesthesia was administered patient was placed in dorsal lithotomy position.  Her genitalia was then prepped and draped in usual sterile fashion.  A rigid 74 French cystoscope was passed in the urethra and the bladder.  Bladder was inspected free masses or lesions.  the right ureteral orifices were in the normal orthotopic locations.  a 6 french ureteral catheter was then instilled into the right ureter orifice.  a gentle retrograde was obtained and findings noted above.  we then placed a zip wire through the ureteral catheter and advanced up to the renal pelvis.  we then removed the cystoscope and cannulated the right ureteral orifice with a semirigid ureteroscope.  No stone was found in the ureter. Once we reached the UPJ a sensor wire was advanced in to the renal pelvis. We then removed the ureteroscope and advanced 12/14 x 38 access sheath up to the renal pelvis. a flexible ureteroscope was then used  to perform nephroscopy.. We encountered the stone in the renal pelvis/UPJ.  using a 200 nm laser fiber and fragmented the stone into smaller pieces.  the pieces were then removed with a Ngage basket. Once the fragments were removed we removed the access sheath under direct vision and noted no injury to the ureter. we then placed a 6 x 26 double-j ureteral stent over the original zip wire. We removed the wire and good coil was noted in the the renal pelvis under fluoroscopy and the bladder under direct vision.      the bladder was then drained and this concluded the procedure which was well tolerated by patient.  Complications: None  Condition: Stable, extubated, transferred to PACU  Plan: Patient is to be discharged home as to follow-up in 2 weeks. She is to remove her stent by pulling the tether in 48 hours

## 2016-09-06 NOTE — Progress Notes (Signed)
Witnessed waste 1cc fentanyl in sink.

## 2016-09-06 NOTE — Discharge Instructions (Signed)
Cystoscopy, Care After Refer to this sheet in the next few weeks. These instructions provide you with information on caring for yourself after your procedure. Your caregiver may also give you more specific instructions. Your treatment has been planned according to current medical practices, but problems sometimes occur. Call your caregiver if you have any problems or questions after your procedure. HOME CARE INSTRUCTIONS  Things you can do to ease any discomfort after your procedure include:  Drinking enough water and fluids to keep your urine clear or pale yellow.  Taking a warm bath to relieve any burning feelings. SEEK IMMEDIATE MEDICAL CARE IF:   You have an increase in blood in your urine.  You notice blood clots in your urine.  You have difficulty passing urine.  You have the chills.  You have abdominal pain.  You have a fever or persistent symptoms for more than 2-3 days.  You have a fever and your symptoms suddenly get worse. MAKE SURE YOU:   Understand these instructions.  Will watch your condition.  Will get help right away if you are not doing well or get worse.   This information is not intended to replace advice given to you by your health care provider. Make sure you discuss any questions you have with your health care provider.   Document Released: 05/19/2005 Document Revised: 11/20/2014 Document Reviewed: 04/22/2012 Elsevier Interactive Patient Education 2016 [employer]  PATIENT INSTRUCTIONS POST-ANESTHESIA  IMMEDIATELY FOLLOWING SURGERY:  Do not drive or operate machinery for the first twenty four hours after surgery.  Do not make any important decisions for twenty four hours after surgery or while taking narcotic pain medications or sedatives.  If you develop intractable nausea and vomiting or a severe headache please notify your doctor immediately.  FOLLOW-UP:  Please make an appointment with your surgeon as instructed. You do not need to follow up with  anesthesia unless specifically instructed to do so.  WOUND CARE INSTRUCTIONS (if applicable):  Keep a dry clean dressing on the anesthesia/puncture wound site if there is drainage.  Once the wound has quit draining you may leave it open to air.  Generally you should leave the bandage intact for twenty four hours unless there is drainage.  If the epidural site drains for more than 36-48 hours please call the anesthesia department.  QUESTIONS?:  Please feel free to call your physician or the hospital operator if you have any questions, and they will be happy to assist you.

## 2016-09-06 NOTE — Addendum Note (Signed)
Addendum  created 09/06/16 1323 by Vista Deck, CRNA   Sign clinical note

## 2016-09-06 NOTE — H&P (Signed)
Urology Admission H&P  Chief Complaint: right flank pain  History of Present Illness: Michaela Aguilar is a 50yo with right proximal calculus who has failed ESWL and MET.  Past Medical History:  Diagnosis Date  . Anxiety   . Bronchitis   . Depression   . History of kidney stones   . Kidney stones per pt.   Past Surgical History:  Procedure Laterality Date  . CESAREAN SECTION     x2  . [place] 2010  . HERNIA REPAIR     incisional hernia    Home Medications:  Prescriptions Prior to Admission  Medication Sig Dispense Refill Last Dose  . ALPRAZolam (XANAX) 1 MG tablet Take 1 mg by mouth 2 (two) times daily as needed for anxiety. For pain   09/04/2016 at 1900  . Multiple Vitamins-Minerals (MULTIVITAMIN WITH MINERALS) tablet Take 1 tablet by mouth daily.   Past Week at Unknown time  . ondansetron (ZOFRAN) 4 MG tablet Take 1 tablet (4 mg total) by mouth every 6 (six) hours as needed. 12 tablet 0 Past Week at Unknown time  . oxyCODONE-acetaminophen (PERCOCET) 5-325 MG tablet Take 1 tablet by mouth every 4 (four) hours as needed for moderate pain. 6 tablet 0 Past Week at Unknown time  . promethazine (PHENERGAN) 12.5 MG tablet Take 12.5 mg by mouth every 6 (six) hours as needed for nausea or vomiting.   Past Week at Unknown time   Allergies: No Known Allergies  History reviewed. No pertinent family history. Social History:  reports that she quit smoking about 3 years ago. Her smoking use included Cigarettes. She has a 20.00 pack-year smoking history. She has never used smokeless tobacco. She reports that she uses drugs, including Marijuana. She reports that she does not drink alcohol.  Review of Systems  Genitourinary: Positive for flank pain, frequency, hematuria and urgency.  All other systems reviewed and are negative.   Physical Exam:  Vital signs in last 24 hours: Temp:  [98.1 F (36.7 C)] 98.1 F (36.7 C) (10/25 0954) Pulse Rate:  [60] 60 (10/25  0954) Resp:  [14-26] 16 (10/25 1055) BP: (102-138)/(64-93) 106/71 (10/25 1050) SpO2:  [93 %-97 %] 97 % (10/25 1055) Weight:  [106.6 kg (235 lb)] 106.6 kg (235 lb) (10/25 0954) Physical Exam  Constitutional: She is oriented to person, place, and time. She appears well-developed and well-nourished.  HENT:  Head: Normocephalic and atraumatic.  Eyes: EOM are normal. Pupils are equal, round, and reactive to light.  Neck: Normal range of motion. No thyromegaly present.  Cardiovascular: Normal rate and regular rhythm.   Respiratory: No respiratory distress.  GI: Soft. She exhibits no distension.  Musculoskeletal: Normal range of motion. She exhibits no edema.  Neurological: She is alert and oriented to person, place, and time.  Skin: Skin is warm and dry.  Psychiatric: She has a normal mood and affect. Her behavior is normal. Judgment and thought content normal.    Laboratory Data:  Results for orders placed or performed during the hospital encounter of 09/06/16 (from the past 24 hour(s))  Glucose, capillary     Status: Abnormal   Collection Time: 09/06/16  9:37 AM  Result Value Ref Range   Glucose-Capillary 131 (H) 65 - 99 mg/dL   No results found for this or any previous visit (from the past 240 hour(s)). Creatinine: No results for input(s): CREATININE in the last 168 hours. Baseline Creatinine:unknwon  Impression/Assessment:  50yo with right proximal ureteral calculsu  Plan:  The risks/benefits/alternatives to right ureteroscopic stone extractionw as explained to the patient and she understands and wishes to proceed with surgery  Nicolette Bang 09/06/2016, 11:25 AM

## 2016-09-06 NOTE — Anesthesia Postprocedure Evaluation (Signed)
Anesthesia Post Note  Patient: Michaela Aguilar  Procedure(s) Performed: Procedure(s) (LRB): CYSTOSCOPY/RIGHT RETROGRADE PYELOGRAM, RIGHT STONE EXTRACTION WITH LASER (Right) CYSTOSCOPY WITH RIGHT URETERAL STENT PLACEMENT (Right)  Patient location during evaluation: PACU Anesthesia Type: General Level of consciousness: awake Pain management: pain level controlled Vital Signs Assessment: post-procedure vital signs reviewed and stable Respiratory status: spontaneous breathing and non-rebreather facemask Cardiovascular status: stable Anesthetic complications: no    Last Vitals:  Vitals:   09/06/16 1235 09/06/16 1245  BP: 127/79 129/86  Pulse: 72 61  Resp: 20 19  Temp: 36.5 C     Last Pain:  Vitals:   09/06/16 1235  TempSrc:   PainSc: (P) 6                  Tomorrow Dehaas

## 2016-09-06 NOTE — Transfer of Care (Signed)
Immediate Anesthesia Transfer of Care Note  Patient: Michaela Aguilar  Procedure(s) Performed: Procedure(s): CYSTOSCOPY/RIGHT RETROGRADE PYELOGRAM, RIGHT STONE EXTRACTION WITH LASER (Right) CYSTOSCOPY WITH RIGHT URETERAL STENT PLACEMENT (Right)  Patient Location: PACU  Anesthesia Type:General  Level of Consciousness: awake, alert  and oriented  Airway & Oxygen Therapy: Patient Spontanous Breathing and Patient connected to nasal cannula oxygen  Post-op Assessment: Report given to RN and Post -op Vital signs reviewed and stable  Post vital signs: Reviewed and stable  Last Vitals:  Vitals:   09/06/16 1130 09/06/16 1135  BP:  120/70  Pulse:    Resp: 20 20  Temp:      Last Pain:  Vitals:   09/06/16 0954  TempSrc: Oral      Patients Stated Pain Goal: 8 ([id number])  Complications: No apparent anesthesia complications

## 2016-09-06 NOTE — Addendum Note (Signed)
Addendum  created 09/06/16 1325 by Jonna Munro, CRNA   Sign clinical note

## 2016-09-06 NOTE — Anesthesia Procedure Notes (Signed)
Procedure Name: LMA Insertion Date/Time: 09/06/2016 11:49 AM Performed by: Jonna Munro Pre-anesthesia Checklist: Patient identified, Emergency Drugs available, Suction available, Patient being monitored and Timeout performed Patient Re-evaluated:Patient Re-evaluated prior to inductionOxygen Delivery Method: Circle system utilized Preoxygenation: Pre-oxygenation with 100% oxygen Intubation Type: IV induction LMA: LMA inserted LMA Size: 3.0 Number of attempts: 1 Placement Confirmation: positive ETCO2 and breath sounds checked- equal and bilateral Tube secured with: Tape Dental Injury: Teeth and Oropharynx as per pre-operative assessment  Comments: LMA well lubricated, easy, atraumatic placement.

## 2016-09-06 NOTE — Addendum Note (Signed)
Addendum  created 09/06/16 1416 by Vista Deck, CRNA   Anesthesia Intra Flowsheets edited

## 2016-09-11 ENCOUNTER — Encounter (HOSPITAL_COMMUNITY): Payer: Self-pay | Admitting: Urology

## 2016-09-18 ENCOUNTER — Encounter: Payer: Self-pay | Admitting: Sports Medicine

## 2016-09-18 ENCOUNTER — Ambulatory Visit (INDEPENDENT_AMBULATORY_CARE_PROVIDER_SITE_OTHER): Payer: Self-pay | Admitting: Sports Medicine

## 2016-09-18 VITALS — BP 96/55 | HR 62 | Ht 65.0 in | Wt 225.0 lb

## 2016-09-18 DIAGNOSIS — G8929 Other chronic pain: Secondary | ICD-10-CM

## 2016-09-18 DIAGNOSIS — M5442 Lumbago with sciatica, left side: Secondary | ICD-10-CM

## 2016-09-18 DIAGNOSIS — M25562 Pain in left knee: Secondary | ICD-10-CM

## 2016-09-18 LAB — STONE ANALYSIS
CA OXALATE, DIHYDRATE: 30 %
CA OXALATE, MONOHYDR.: 60 %
CA PHOS CRY STONE QL IR: 10 %
Stone Weight KSTONE: 15 mg

## 2016-09-18 NOTE — Progress Notes (Signed)
   Subjective:    Patient ID: Michaela Aguilar, female    DOB: January 10, 1966, 50 y.o.   MRN: [id number]  HPI   Patient comes in today for follow-up on left leg radiculopathy. She has had a total of 3 epidural steroid injections. Her pain has improved dramatically but she still has left leg weakness particularly with maneuvering up and down stairs. The cramping she was getting in the left calf has improved as well. Previous MRIs showed a leftward L5-S1 disc protrusion. She is also complaining of some left knee pain which started a couple of days ago. It started acutely without any specific injury. This has happened to her before. She describes a sharp pain that is around the left patella. Pain will radiate to the back of the knee. Some mild swelling.  Interim medical history reviewed Medications reviewed Allergies reviewed    Review of Systems As above    Objective:   Physical Exam  Well-developed, well-nourished. No acute distress. Sitting comfortable in the exam room  She does have weakness both with resisted dorsiflexion and plantar flexion of the left ankle. Sensation is grossly intact to light touch. She is ambulating with the assistance of a cane due to her left leg weakness and her left knee pain.  Examination of the left knee shows full range of motion. No obvious effusion. Slight tenderness to palpation underneath the left patellar facet. Knee is grossly stable ligamentous exam. Some slight tenderness to palpation along medial and lateral joint lines.         Assessment & Plan:   Persistent left leg weakness secondary to L5-S1 lumbar disc herniation Left knee pain  Although the patient has had significant improvement in her pain with recent lumbar epidural steroid injections, her persistent weakness does concern me. I would like to refer her to Dr. Lorin Mercy at [facility]. She may need a discectomy. In regards to her left knee pain, her symptoms have only been present only  for a couple of days. She has been icing and taking over-the-counter pain medicine. This has helped her in the past. She understands that if her symptoms persist, she may return to the office for reevaluation, consideration of diagnostic imaging, and consideration of a cortisone injection. Otherwise, follow-up as needed.

## 2016-09-19 DIAGNOSIS — Z139 Encounter for screening, unspecified: Secondary | ICD-10-CM

## 2016-09-19 LAB — GLUCOSE, POCT (MANUAL RESULT ENTRY): POC GLUCOSE: 126 mg/dL — AB (ref 70–99)

## 2016-09-19 NOTE — Congregational Nurse Program (Signed)
Congregational Nurse Program Note  Date of Encounter: 09/19/2016  Past Medical History: Past Medical History:  Diagnosis Date  . Anxiety   . Bronchitis   . Depression   . History of kidney stones   . Kidney stones per pt.    Encounter Details:     CNP Questionnaire - 09/19/16 1440      Patient Demographics   Is this a new or existing patient? New   Patient is considered a/an Not Applicable   Race Caucasian/White     Patient Assistance   Location of Patient Wing   Patient's financial/insurance status Low Income;Self-Pay (Uninsured)   Uninsured Patient ([place]) Yes   Interventions Counseled to make appt. with provider;Assisted patient in making appt.   Patient referred to apply for the following financial assistance Not Applicable   Food insecurities addressed Not Applicable   Transportation assistance No   Assistance securing medications No   Educational health offerings Chronic disease;Navigating the healthcare system;Nutrition     Encounter Details   Primary purpose of visit Chronic Illness/Condition Visit;Education/Health Concerns;Navigating the Healthcare System   Was an Emergency Department visit averted? Not Applicable   Does patient have a medical provider? No   Patient referred to [facility] PCP   Was a mental health screening completed? (GAINS tool) No   Does patient have dental issues? No   Does patient have vision issues? No   Does your patient have an abnormal blood pressure today? No   Since previous encounter, have you referred patient for abnormal blood pressure that resulted in a new diagnosis or medication change? No   Does your patient have an abnormal blood glucose today? No   Since previous encounter, have you referred patient for abnormal blood glucose that resulted in a new diagnosis or medication change? No   Was there a life-saving intervention made? No     New Client to [facility]. Client  seeking resources for a Primary Care provider. Client states she recently had a urology procedure and she was told her glucose levels were elevated and she states she has a family history of diabetes.  She also reports excessive thirst and dry mouth. Surgical History per client: 2 C-Sections, umbilical hernia repair, kidney stone. Past medical history: Anxiety and Depression( currently an active patient at [facility] next appt 09/25/16), cataracts, back injury due to a fall in 2015 . Client lives with a roommate, she is currently awaiting disability hearing. She currently has no income and no health insurance.  Vitals today: 124/78, pulse 64, Temp orally 98.2, Capillary glucose non-fasting 126 ( had eaten approx. 1 1/2 hours prior to reading). Discussed options for primary medial care and client [facility] to proximity. Appointment secured for 09/21/16 at 1:45 pm. [facility] information [facility] given along with appointment date and time. RN contact information given along with summary of vital signs and glucose performed today.  Will follow up with client after primary care visit. Client to continue at [facility] for [place] services.

## 2016-09-20 ENCOUNTER — Ambulatory Visit (INDEPENDENT_AMBULATORY_CARE_PROVIDER_SITE_OTHER): Payer: Self-pay | Admitting: Urology

## 2016-09-20 DIAGNOSIS — N200 Calculus of kidney: Secondary | ICD-10-CM

## 2016-09-21 ENCOUNTER — Encounter: Payer: Self-pay | Admitting: Physician Assistant

## 2016-09-21 ENCOUNTER — Ambulatory Visit: Payer: Self-pay | Admitting: Physician Assistant

## 2016-09-21 VITALS — BP 106/64 | HR 85 | Temp 97.3°F | Ht 64.75 in | Wt 227.5 lb

## 2016-09-21 DIAGNOSIS — F39 Unspecified mood [affective] disorder: Secondary | ICD-10-CM

## 2016-09-21 DIAGNOSIS — Z1239 Encounter for other screening for malignant neoplasm of breast: Secondary | ICD-10-CM

## 2016-09-21 DIAGNOSIS — E669 Obesity, unspecified: Secondary | ICD-10-CM

## 2016-09-21 DIAGNOSIS — G8929 Other chronic pain: Secondary | ICD-10-CM

## 2016-09-21 DIAGNOSIS — Z13220 Encounter for screening for lipoid disorders: Secondary | ICD-10-CM

## 2016-09-21 DIAGNOSIS — Z131 Encounter for screening for diabetes mellitus: Secondary | ICD-10-CM

## 2016-09-21 DIAGNOSIS — Z6838 Body mass index (BMI) 38.0-38.9, adult: Secondary | ICD-10-CM

## 2016-09-21 LAB — GLUCOSE, POCT (MANUAL RESULT ENTRY): POC Glucose: 196 mg/dl — AB (ref 70–99)

## 2016-09-21 NOTE — Progress Notes (Signed)
BP 106/64   Pulse 85   Temp 97.3 F (36.3 C)   Ht 5' 4.75" (1.645 m)   Wt 227 lb 8 oz (103.2 kg)   LMP 02/16/2014   SpO2 93%   BMI 38.15 kg/m    Subjective:    Patient ID: Michaela Aguilar, female    DOB: 24-Jun-1966, 50 y.o.   MRN: [id number]  HPI: Michaela Aguilar is a 50 y.o. female presenting on 09/21/2016 for New Patient (Initial Visit) (pt states she is due for a pap and mammo)   HPI  Pt previously went to Dr Lorriane Shire but that was about 10 years ago.    Pt goes to [facility] for [place]   Last pap 2012, last mammo 09/22/15  Relevant past medical, surgical, family and social history reviewed and updated as indicated. Interim medical history since our last visit reviewed. Allergies and medications reviewed and updated.   Current Outpatient Prescriptions:  .  ALPRAZolam (XANAX) 1 MG tablet, Take 1 mg by mouth every 6 (six) hours as needed for anxiety. For pain, Disp: , Rfl:  .  amitriptyline (ELAVIL) 25 MG tablet, Take 25 mg by mouth at bedtime., Disp: , Rfl:  .  citalopram (CELEXA) 40 MG tablet, Take 40 mg by mouth daily., Disp: , Rfl:  .  Multiple Vitamins-Minerals (MULTIVITAMIN WITH MINERALS) tablet, Take 1 tablet by mouth daily., Disp: , Rfl:    Review of Systems  Constitutional: Positive for diaphoresis. Negative for appetite change, chills, fatigue, fever and unexpected weight change.  HENT: Negative for congestion, dental problem, drooling, ear pain, facial swelling, hearing loss, mouth sores, sneezing, sore throat, trouble swallowing and voice change.   Eyes: Negative for pain, discharge, redness, itching and visual disturbance.  Respiratory: Negative for cough, choking, shortness of breath and wheezing.   Cardiovascular: Negative for chest pain, palpitations and leg swelling.  Gastrointestinal: Negative for abdominal pain, blood in stool, constipation, diarrhea and vomiting.  Endocrine: Negative for cold intolerance, heat intolerance and polydipsia.  Genitourinary: Negative  for decreased urine volume, dysuria and hematuria.  Musculoskeletal: Positive for back pain and gait problem. Negative for arthralgias.  Skin: Negative for rash.  Allergic/Immunologic: Negative for environmental allergies.  Neurological: Negative for seizures, syncope, light-headedness and headaches.  Hematological: Negative for adenopathy.  Psychiatric/Behavioral: Positive for dysphoric mood. Negative for agitation and suicidal ideas. The patient is nervous/anxious.     Per HPI unless specifically indicated above     Objective:    BP 106/64   Pulse 85   Temp 97.3 F (36.3 C)   Ht 5' 4.75" (1.645 m)   Wt 227 lb 8 oz (103.2 kg)   LMP 02/16/2014   SpO2 93%   BMI 38.15 kg/m   Wt Readings from Last 3 Encounters:  09/21/16 227 lb 8 oz (103.2 kg)  09/19/16 227 lb 9.6 oz (103.2 kg)  09/18/16 225 lb (102.1 kg)    Physical Exam  Constitutional: She is oriented to person, place, and time. She appears well-developed and well-nourished.  HENT:  Head: Normocephalic and atraumatic.  Mouth/Throat: Oropharynx is clear and moist. No oropharyngeal exudate.  Eyes: Conjunctivae and EOM are normal. Pupils are equal, round, and reactive to light.  Neck: Neck supple. No thyromegaly present.  Cardiovascular: Normal rate and regular rhythm.   Pulmonary/Chest: Effort normal and breath sounds normal.  Abdominal: Soft. Bowel sounds are normal. She exhibits no mass. There is no hepatosplenomegaly. There is no tenderness.  Musculoskeletal: She exhibits no edema.  Lymphadenopathy:    She has no cervical adenopathy.  Neurological: She is alert and oriented to person, place, and time. Gait (pt walks slowly with assistance of a cane) abnormal.  Skin: Skin is warm and dry.  Psychiatric: She has a normal mood and affect. Her behavior is normal.  Vitals reviewed.   Results for orders placed or performed in visit on 09/21/16  POCT Glucose (CBG)  Result Value Ref Range   POC Glucose 196 (A) 70 - 99 mg/dl       Assessment & Plan:   Encounter Diagnoses  Name Primary?  . Mood disorder ([place]) Yes  . Class 2 obesity with body mass index (BMI) of 38.0 to 38.9 in adult, unspecified obesity type, unspecified whether serious comorbidity present   . Screening for diabetes mellitus (DM)   . Screening cholesterol level   . Screening for breast cancer   . Other chronic pain     -check baseline labs- tsh a1c, lipids -screening mammogram -will follow up in month.  Will plan to do PAP at that appointment unless she has diabetes -pt to continue with [facility] for [place] issues -pt to continue with current orthopedist for back issues -f/u one month.  RTO sooner prn

## 2016-09-22 LAB — LIPID PANEL
CHOLESTEROL: 172 mg/dL (ref <200)
HDL: 41 mg/dL — ABNORMAL LOW (ref >50)
LDL Cholesterol: 106 mg/dL — ABNORMAL HIGH (ref <100)
TRIGLYCERIDES: 125 mg/dL (ref <150)
Total CHOL/HDL Ratio: 4.2 Ratio (ref <5.0)
VLDL: 25 mg/dL (ref <30)

## 2016-09-22 LAB — TSH: TSH: 1.42 m[IU]/L

## 2016-09-23 LAB — HEMOGLOBIN A1C
Hgb A1c MFr Bld: 6.3 % — ABNORMAL HIGH (ref <5.7)
MEAN PLASMA GLUCOSE: 134 mg/dL

## 2016-09-28 ENCOUNTER — Ambulatory Visit (HOSPITAL_COMMUNITY)
Admission: RE | Admit: 2016-09-28 | Discharge: 2016-09-28 | Disposition: A | Discharge status: Home / Self Care | Payer: Self-pay | Source: Ambulatory Visit | Attending: Physician Assistant | Admitting: Physician Assistant

## 2016-10-17 ENCOUNTER — Ambulatory Visit (INDEPENDENT_AMBULATORY_CARE_PROVIDER_SITE_OTHER): Payer: Self-pay

## 2016-10-17 ENCOUNTER — Ambulatory Visit (INDEPENDENT_AMBULATORY_CARE_PROVIDER_SITE_OTHER): Payer: Self-pay | Admitting: Orthopaedic Surgery

## 2016-10-17 ENCOUNTER — Encounter (INDEPENDENT_AMBULATORY_CARE_PROVIDER_SITE_OTHER): Payer: Self-pay | Admitting: Orthopaedic Surgery

## 2016-10-17 VITALS — BP 139/85 | HR 61 | Ht 65.0 in | Wt 226.0 lb

## 2016-10-17 DIAGNOSIS — M5442 Lumbago with sciatica, left side: Secondary | ICD-10-CM

## 2016-10-17 NOTE — Progress Notes (Signed)
Office Visit Note/ Orthopedic consultation  Reason:  Fall 05/2015 with chronic LBP  Patient: Michaela Aguilar           Date of Birth: Nov 30, 1965           MRN: [id number] Visit Date: 10/17/2016              Requested by: Thurman Coyer, DO [address], [place] [address] PCP: Soyla Dryer, PA-C   Assessment & Plan: Visit Diagnoses:  1. Left-sided low back pain with left-sided sciatica, unspecified chronicity     Plan: Patient is a working on weight loss and has lost 20 pounds I congratulated her on this and she understands this also will help with the her Route likely diagnosis of borderline diabetes. She needs continued work on weight loss. I reviewed the lumbar MRI with her which shows the minimal bulge at L5-S1 without compression. She actually has fairly good hydration of her disks and well-maintained disc height for her age she has a little bit of facet arthropathy at L4-5 but has no compression. She's not able do a single sit up have some core weakness and needs to continue working on some core strengthening continue on weight loss continue on walking and gradually progressed a single-point cane and then as she gets stronger and the improvement in her core symptoms she could stop using the cane. She has some mild chondromalacia patella left knee. Previous 4 view x-rays of time her fall in July 2016 were reviewed with her which were negative for arthritis and negative for fracture. We discussed doing some straight leg raise strengthening using a pursestring over her ankle as a counterweight. She and I both agree that using narcotic medication for pain is not a good idea. She'll continue on her health redness changes with weight loss core strengthening and return when necessary.Thank you for line me to see her in consultation.  Follow-Up Instructions: No Follow-up on file.   Orders:  Orders Placed This Encounter  Procedures  . XR Lumbar Spine 2-3 Views   No orders of  the defined types were placed in this encounter.     Procedures: No procedures performed   Clinical Data: No additional findings.   Subjective: Chief Complaint  Patient presents with  . Lower Back - Pain    Patient fell on 05/19/2015 at grocery store. She had MRI afterwards and has recently had ESI's by Dr Micheline Chapman. She complains of left low back pain that radiates down into left buttocks. She states that she is starting to have trouble with her knee. She ambulates with a cane today. She denies any recent x-rays or MRI. She has tried Motrin for pain with no relief.    Review of Systems  Constitutional: Negative for chills and diaphoresis.  HENT: Negative for ear discharge, ear pain and nosebleeds.   Eyes: Negative for discharge and visual disturbance.  Respiratory: Negative for cough, choking and shortness of breath.   Cardiovascular: Negative for chest pain and palpitations.  Gastrointestinal: Negative for abdominal distention and abdominal pain.  Endocrine: Negative for cold intolerance and heat intolerance.       History of gestational diabetes many years ago. She just had some tests done and may be a borderline diabetic.  Genitourinary: Negative for flank pain and hematuria.  Musculoskeletal: Positive for back pain.       Follow with the back pain symptoms on the left side since fall in a grocery store 2016 and July.  She's been worked up with cervical MRI thoracic MRI and lumbar MRI.  Skin: Negative for rash and wound.  Neurological: Negative for seizures and speech difficulty.  Hematological: Negative for adenopathy. Does not bruise/bleed easily.  Psychiatric/Behavioral: Negative for agitation and suicidal ideas.     Objective: Vital Signs: BP 139/85   Pulse 61   Ht 5\' 5"  (1.651 m)   Wt 226 lb (102.5 kg)   LMP 02/16/2014   BMI 37.61 kg/m   Physical Exam  Constitutional: She is oriented to person, place, and time. She appears well-developed.  HENT:  Head:  Normocephalic.  Right Ear: External ear normal.  Left Ear: External ear normal.  Eyes: Pupils are equal, round, and reactive to light.  Neck: No tracheal deviation present. No thyromegaly present.  Cardiovascular: Normal rate.   Pulmonary/Chest: Effort normal.  Abdominal: Soft.  Musculoskeletal:  The patient's pelvis is level shows mild scoliosis negative logroll. She has some discomfort with straight leg raising at 90. Knee and ankle jerk are 2+ and symmetrical in isolated motor weakness. Patient Center with a 4 pronged cane. She can ambulate without the cane in the house. Patient has crepitus with the left knee. No locking. No effusion is noted collateral ligaments are stable. Patellofemoral crepitus chest tenderness on popped a region but no definite palpable Baker's cyst.  Neurological: She is alert and oriented to person, place, and time.  Skin: Skin is warm and dry.  Psychiatric: She has a normal mood and affect. Her behavior is normal.    Ortho Exam the cellulitis of the upper extremities. Crepitus with left knee range of motion negative logroll both hips. She has some sciatic notch tenderness. No rash overexposed skin skin of the lumbar spine is normal. Anterior tib EHL peroneal*strong posterior tibial is normal.  Specialty Comments:  No specialty comments available.  Imaging: Xr Lumbar Spine 2-3 Views  Result Date: 10/17/2016 [facility] and lateral thoracolumbar x-rays reviewed. This shows the thoracolumbar curve apex at L1 less than 15. Mild endplate the changes at L3-4 without spondylolisthesis. Impression: Lumbar x-rays negative for compression fracture slight scoliosis.    PMFS History: Patient Active Problem List   Diagnosis Date Noted  . Neck pain 11/17/2015  . Low back pain 11/17/2015   Past Medical History:  Diagnosis Date  . Anxiety   . Bronchitis   . Depression   . Gestational diabetes    1992  . History of kidney stones   . Kidney stones per pt.    Family  History  Problem Relation Age of Onset  . Osteoporosis Mother   . Depression Father   . Mental illness Father   . Cancer Maternal Uncle     lung cancer  . COPD Maternal Uncle   . Depression Maternal Uncle   . Emphysema Maternal Grandmother   . Cancer Maternal Grandfather     lung cancer    Past Surgical History:  Procedure Laterality Date  . CESAREAN SECTION     x2  . CYSTOSCOPY WITH STENT PLACEMENT Right 09/06/2016   Procedure: CYSTOSCOPY WITH RIGHT URETERAL STENT PLACEMENT;  Surgeon: Cleon Gustin, MD;  Location: [facility];  Service: Urology;  Laterality: Right;  . CYSTOSCOPY/RETROGRADE/URETEROSCOPY/STONE EXTRACTION WITH BASKET Right 09/06/2016   Procedure: CYSTOSCOPY/RIGHT RETROGRADE PYELOGRAM, RIGHT RENAL STONE EXTRACTION WITH LASER;  Surgeon: Cleon Gustin, MD;  Location: [facility];  Service: Urology;  Laterality: Right;  . [place] 2010  . HERNIA REPAIR     incisional  hernia   Social History   Occupational History  . Not on file.   Social History Main Topics  . Smoking status: Former Smoker    Packs/day: 0.50    Years: 15.00    Types: Cigarettes, Cigars    Quit date: 2012  . Smokeless tobacco: Never Used     Comment: quit smoking cigarretes 2012. pt states she smokes 1 black and mild every 3 days  . Alcohol use No  . Drug use: No     Comment: last marijuana use early October 2017  . Sexual activity: Yes    Birth control/ protection: None, Post-menopausal

## 2016-10-25 ENCOUNTER — Ambulatory Visit: Payer: Self-pay | Admitting: Sports Medicine

## 2016-10-26 ENCOUNTER — Ambulatory Visit: Payer: Self-pay | Admitting: Physician Assistant

## 2016-10-26 ENCOUNTER — Encounter: Payer: Self-pay | Admitting: Physician Assistant

## 2016-10-26 VITALS — BP 118/70 | HR 75 | Temp 97.7°F | Ht 65.0 in | Wt 226.5 lb

## 2016-10-26 DIAGNOSIS — Z124 Encounter for screening for malignant neoplasm of cervix: Secondary | ICD-10-CM

## 2016-10-26 DIAGNOSIS — R7303 Prediabetes: Secondary | ICD-10-CM

## 2016-10-26 DIAGNOSIS — Z6838 Body mass index (BMI) 38.0-38.9, adult: Secondary | ICD-10-CM

## 2016-10-26 DIAGNOSIS — E669 Obesity, unspecified: Secondary | ICD-10-CM

## 2016-10-26 DIAGNOSIS — E119 Type 2 diabetes mellitus without complications: Secondary | ICD-10-CM | POA: Insufficient documentation

## 2016-10-26 NOTE — Progress Notes (Signed)
BP 118/70 (BP Location: Left Arm, Patient Position: Sitting, Cuff Size: Large)   Pulse 75   Temp 97.7 F (36.5 C) (Other (Comment))   Ht 5\' 5"  (1.651 m)   Wt 226 lb 8 oz (102.7 kg)   LMP 02/16/2014   SpO2 97%   BMI 37.69 kg/m    Subjective:    Patient ID: Michaela Aguilar, female    DOB: 1966/07/12, 50 y.o.   MRN: [id number]  HPI: AHMIRACLE UPCHURCH is a 51 y.o. female presenting on 10/26/2016 for Gynecologic Exam and Follow-up   HPI   Pt sees dr Micheline Chapman in [place] and dr Lorin Mercy - orthopedists- for her leg and back.   She Has f/u January 3  She is doing well today except for her usual leg and back pain  Relevant past medical, surgical, family and social history reviewed and updated as indicated. Interim medical history since our last visit reviewed. Allergies and medications reviewed and updated.  Current Outpatient Prescriptions:  .  ALPRAZolam (XANAX) 1 MG tablet, Take 1 mg by mouth every 6 (six) hours as needed for anxiety. For pain, Disp: , Rfl:  .  amitriptyline (ELAVIL) 25 MG tablet, Take 25 mg by mouth at bedtime., Disp: , Rfl:  .  citalopram (CELEXA) 40 MG tablet, Take 40 mg by mouth daily., Disp: , Rfl:  .  Multiple Vitamins-Minerals (MULTIVITAMIN WITH MINERALS) tablet, Take 1 tablet by mouth daily., Disp: , Rfl:   Review of Systems  Constitutional: Negative for appetite change, chills, diaphoresis, fatigue, fever and unexpected weight change.  HENT: Negative for congestion, dental problem, drooling, ear pain, facial swelling, hearing loss, mouth sores, sneezing, sore throat, trouble swallowing and voice change.   Eyes: Negative for pain, discharge, redness, itching and visual disturbance.  Respiratory: Negative for cough, choking, shortness of breath and wheezing.   Cardiovascular: Positive for leg swelling. Negative for chest pain and palpitations.  Gastrointestinal: Negative for abdominal pain, blood in stool, constipation, diarrhea and vomiting.  Endocrine: Negative for  cold intolerance, heat intolerance and polydipsia.  Genitourinary: Negative for decreased urine volume, dysuria and hematuria.  Musculoskeletal: Positive for arthralgias, back pain and gait problem.  Skin: Negative for rash.  Allergic/Immunologic: Negative for environmental allergies.  Neurological: Negative for seizures, syncope, light-headedness and headaches.  Hematological: Negative for adenopathy.  Psychiatric/Behavioral: Positive for dysphoric mood. Negative for agitation and suicidal ideas. The patient is nervous/anxious.     Per HPI unless specifically indicated above     Objective:    BP 118/70 (BP Location: Left Arm, Patient Position: Sitting, Cuff Size: Large)   Pulse 75   Temp 97.7 F (36.5 C) (Other (Comment))   Ht 5\' 5"  (1.651 m)   Wt 226 lb 8 oz (102.7 kg)   LMP 02/16/2014   SpO2 97%   BMI 37.69 kg/m   Wt Readings from Last 3 Encounters:  10/26/16 226 lb 8 oz (102.7 kg)  10/17/16 226 lb (102.5 kg)  09/21/16 227 lb 8 oz (103.2 kg)    Physical Exam  Constitutional: She is oriented to person, place, and time. She appears well-developed and well-nourished.  Pulmonary/Chest: Effort normal.  Breast exam normal  Abdominal: Soft. She exhibits no mass. There is no tenderness. There is no rebound and no guarding.  Genitourinary: Vagina normal and uterus normal. No breast swelling, tenderness, discharge or bleeding. There is no rash, tenderness or lesion on the right labia. There is no rash, tenderness or lesion on the left labia. Cervix exhibits  no motion tenderness, no discharge and no friability. Right adnexum displays no mass, no tenderness and no fullness. Left adnexum displays no mass, no tenderness and no fullness.  Genitourinary Comments: (nurse Butch Penny assisted)  Neurological: She is alert and oriented to person, place, and time.  Skin: Skin is warm and dry.  Psychiatric: She has a normal mood and affect. Her behavior is normal.  Nursing note and vitals  reviewed.   Results for orders placed or performed in visit on 09/21/16  TSH  Result Value Ref Range   TSH 1.42 mIU/L  HgB A1c  Result Value Ref Range   Hgb A1c MFr Bld 6.3 (H) <5.7 %   Mean Plasma Glucose 134 mg/dL  Lipid Profile  Result Value Ref Range   Cholesterol 172 <200 mg/dL   Triglycerides 125 <150 mg/dL   HDL 41 (L) >50 mg/dL   Total CHOL/HDL Ratio 4.2 <5.0 Ratio   VLDL 25 <30 mg/dL   LDL Cholesterol 106 (H) <100 mg/dL  POCT Glucose (CBG)  Result Value Ref Range   POC Glucose 196 (A) 70 - 99 mg/dl      Assessment & Plan:   Encounter Diagnoses  Name Primary?  . Routine Papanicolaou smear Yes  . Prediabetes   . Class 2 obesity with body mass index (BMI) of 38.0 to 38.9 in adult, unspecified obesity type, unspecified whether serious comorbidity present     -reviewed labs with pt -counseled pt on prediabetes and ways to postpone diabetes.  Gave handouts -counseled pt on BSE -pt to follow up in 5 months to recheck.  She is to RTO sooner prn

## 2016-10-26 NOTE — Patient Instructions (Signed)
Prediabetes Prediabetes is the condition of having a blood sugar (blood glucose) level that is higher than it should be, but not high enough for you to be diagnosed with type 2 diabetes. Having prediabetes puts you at risk for developing type 2 diabetes (type 2 diabetes mellitus). Prediabetes may be called impaired glucose tolerance or impaired fasting glucose. Prediabetes usually does not cause symptoms. Your health care provider can diagnose this condition with blood tests. You may be tested for prediabetes if you are overweight and if you have at least one other risk factor for prediabetes. Risk factors for prediabetes include:  Having a family member with type 2 diabetes.  Being overweight or obese.  Being older than age 45.  Being of American-Indian, African-American, Hispanic/Latino, or Asian/Pacific Islander descent.  Having an inactive (sedentary) lifestyle.  Having a history of gestational diabetes or polycystic ovarian syndrome (PCOS).  Having low levels of good cholesterol (HDL-C) or high levels of blood fats (triglycerides).  Having high blood pressure. What is blood glucose and how is blood glucose measured?   Blood glucose refers to the amount of glucose in your bloodstream. Glucose comes from eating foods that contain sugars and starches (carbohydrates) that the body breaks down into glucose. Your blood glucose level may be measured in mg/dL (milligrams per deciliter) or mmol/L (millimoles per liter).Your blood glucose may be checked with one or more of the following blood tests:  A fasting blood glucose (FBG) test. You will not be allowed to eat (you will fast) for at least 8 hours before a blood sample is taken.  A normal range for FBG is 70-100 mg/dl (3.9-5.6 mmol/L).  An A1c (hemoglobin A1c) blood test. This test provides information about blood glucose control over the previous 2?3months.  An oral glucose tolerance test (OGTT). This test measures your blood glucose  twice:  After fasting. This is your baseline level.  Two hours after you drink a beverage that contains glucose. You may be diagnosed with prediabetes:  If your FBG is 100?125 mg/dL (5.6-6.9 mmol/L).  If your A1c level is 5.7?6.4%.  If your OGGT result is 140?199 mg/dL (7.8-11 mmol/L). These blood tests may be repeated to confirm your diagnosis. What happens if blood glucose is too high? The pancreas produces a hormone (insulin) that helps move glucose from the bloodstream into cells. When cells in the body do not respond properly to insulin that the body makes (insulin resistance), excess glucose builds up in the blood instead of going into cells. As a result, high blood glucose (hyperglycemia) can develop, which can cause many complications. This is a symptom of prediabetes. What can happen if blood glucose stays higher than normal for a long time? Having high blood glucose for a long time is dangerous. Too much glucose in your blood can damage your nerves and blood vessels. Long-term damage can lead to complications from diabetes, which may include:  Heart disease.  Stroke.  Blindness.  Kidney disease.  Depression.  Poor circulation in the feet and legs, which could lead to surgical removal (amputation) in severe cases. How can prediabetes be prevented from turning into type 2 diabetes?   To help prevent type 2 diabetes, take the following actions:  Be physically active.  Do moderate-intensity physical activity for at least 30 minutes on at least 5 days of the week, or as much as told by your health care provider. This could be brisk walking, biking, or water aerobics.  Ask your health care provider what activities are   safe for you. A mix of physical activities may be best, such as walking, swimming, cycling, and strength training.  Lose weight as told by your health care provider.  Losing 5-7% of your body weight can reverse insulin resistance.  Your health care  provider can determine how much weight loss is best for you and can help you lose weight safely.  Follow a healthy meal plan. This includes eating lean proteins, complex carbohydrates, fresh fruits and vegetables, low-fat dairy products, and healthy fats.  Follow instructions from your health care provider about eating or drinking restrictions.  Make an appointment to see a diet and nutrition specialist (registered dietitian) to help you create a healthy eating plan that is right for you.  Do not smoke or use any tobacco products, such as cigarettes, chewing tobacco, and e-cigarettes. If you need help quitting, ask your health care provider.  Take over-the-counter and prescription medicines as told by your health care provider. You may be prescribed medicines that help lower the risk of type 2 diabetes. This information is not intended to replace advice given to you by your health care provider. Make sure you discuss any questions you have with your health care provider. Document Released: 02/21/2016 Document Revised: 04/06/2016 Document Reviewed: 12/21/2015 Elsevier Interactive Patient Education  2017 [employer] Inc.  

## 2016-11-15 ENCOUNTER — Ambulatory Visit (INDEPENDENT_AMBULATORY_CARE_PROVIDER_SITE_OTHER): Payer: Self-pay | Admitting: Sports Medicine

## 2016-11-15 ENCOUNTER — Encounter: Payer: Self-pay | Admitting: Sports Medicine

## 2016-11-15 VITALS — BP 137/78 | HR 74 | Ht 65.0 in | Wt 226.0 lb

## 2016-11-15 DIAGNOSIS — M5442 Lumbago with sciatica, left side: Secondary | ICD-10-CM

## 2016-11-15 DIAGNOSIS — G8929 Other chronic pain: Secondary | ICD-10-CM

## 2016-11-15 MED ORDER — DICLOFENAC SODIUM 75 MG PO TBEC: 75.0000 mg | DELAYED_RELEASE_TABLET | Freq: Two times a day (BID) | ORAL | 0 refills | Status: DC | PRN

## 2016-11-15 MED ORDER — GABAPENTIN 300 MG PO CAPS: 300.0000 mg | ORAL_CAPSULE | Freq: Every evening | ORAL | 0 refills | Status: DC | PRN

## 2016-11-17 NOTE — Progress Notes (Signed)
Patient ID: Michaela Aguilar, female   DOB: Nov 13, 1966, 51 y.o.   MRN: [id number]  Patient comes in today to discuss ongoing low back pain. She was recently seen by Dr. Lorin Mercy at [facility]. He does not believe she has any sort of surgical pathology. He recommended physical therapy but it has been difficult for the patient to do this. She continues to complain of pain in her low back with radiating pain, numbness, and tingling down the left leg. This causes her to also experience weakness requiring her to walk with a cane. Physical exam was not repeated today. We simply talked about her ongoing dilemma. I've explained to her that physical therapy is very important and we will try to arrange for physical therapy closer to her house to make it more convenient for her. I would also like to try her on some Voltaren 75 mg twice daily with food and Neurontin 300 mg daily at bedtime. Patient will follow-up with me in 6 weeks for reevaluation.  Total time spent with the patient was 15 minutes with greater than 50% of the time spent in face-to-face consultation discussing her ongoing low back issues and treatment plan.

## 2016-12-08 ENCOUNTER — Other Ambulatory Visit: Payer: Self-pay | Admitting: Urology

## 2016-12-08 DIAGNOSIS — N200 Calculus of kidney: Secondary | ICD-10-CM

## 2016-12-20 ENCOUNTER — Ambulatory Visit (HOSPITAL_COMMUNITY): Payer: Self-pay

## 2016-12-26 ENCOUNTER — Ambulatory Visit: Payer: Self-pay | Admitting: Sports Medicine

## 2017-01-04 ENCOUNTER — Ambulatory Visit (HOSPITAL_COMMUNITY): Admission: RE | Admit: 2017-01-04 | Payer: Self-pay | Source: Ambulatory Visit

## 2017-01-12 ENCOUNTER — Ambulatory Visit (HOSPITAL_COMMUNITY)
Admission: RE | Admit: 2017-01-12 | Discharge: 2017-01-12 | Disposition: A | Discharge status: Home / Self Care | Payer: Self-pay | Source: Ambulatory Visit | Attending: Urology | Admitting: Urology

## 2017-01-12 DIAGNOSIS — N200 Calculus of kidney: Secondary | ICD-10-CM

## 2017-01-15 ENCOUNTER — Ambulatory Visit (INDEPENDENT_AMBULATORY_CARE_PROVIDER_SITE_OTHER): Payer: Self-pay | Admitting: Sports Medicine

## 2017-01-15 ENCOUNTER — Encounter: Payer: Self-pay | Admitting: Sports Medicine

## 2017-01-15 VITALS — BP 131/76 | HR 65 | Ht 66.0 in | Wt 221.0 lb

## 2017-01-15 DIAGNOSIS — M25562 Pain in left knee: Secondary | ICD-10-CM

## 2017-01-15 DIAGNOSIS — G8929 Other chronic pain: Secondary | ICD-10-CM

## 2017-01-15 MED ORDER — DICLOFENAC SODIUM 75 MG PO TBEC: 75.0000 mg | DELAYED_RELEASE_TABLET | Freq: Two times a day (BID) | ORAL | 1 refills | Status: DC | PRN

## 2017-01-15 MED ORDER — GABAPENTIN 300 MG PO CAPS: 300.0000 mg | ORAL_CAPSULE | Freq: Every evening | ORAL | 1 refills | Status: DC | PRN

## 2017-01-15 NOTE — Progress Notes (Signed)
   Subjective:    Patient ID: Michaela Aguilar, female    DOB: 07-28-1966, 51 y.o.   MRN: [id number]  HPI  Patient comes in today for follow-up on left leg sciatica. Voltaren and Neurontin have both been helpful. Her main complaint today is left knee instability. She's not sure if it's because of her low back or if it's a separate knee issue. She is unable to completely flex the knee. She describes feelings of instability which occur quite frequently. She is still having to ambulate with a cane. She was unable to do physical therapy due to financial restrictions.     Review of Systems    As above  Objective:   Physical Exam  Well-developed, well-nourished. No acute distress  Neurological exam shows moderate atrophy of the left quadriceps compared to the right. No noticeable calf atrophy. Reflexes are brisk and equal at the Achilles and patellar reflexes bilaterally. Sensation is intact to light touch grossly.  Left knee exam: Range of motion is 0-110. Patient is unable to flex beyond that. No effusion. No tenderness to palpation. Knee is stable to valgus and varus stressing. Negative anterior drawer, negative posterior drawer. Neurovascularly intact distally. Walking with a limp and the assistance of a cane.  Previous x-rays of the left knee showed nothing acute.      Assessment & Plan:  Sciatica-stable Left knee instability-rule out internal derangement  Patient has definite atrophy of her left quadriceps which may be contributing to her left knee instability. However, her inability to completely flex the knee could be due to an intra-articular loose body. I would like to get an MRI of the left knee specifically to rule out significant ligamentous injury which may be contributing to her instability. This will also rule out any intra-articular loose bodies. Patient will follow-up with me one to 2 days after that study to discuss the results. In the meantime, she will purchase a double  upright brace (she was shown a model in the office today) and I will refill her Voltaren and gabapentin.

## 2017-01-17 ENCOUNTER — Ambulatory Visit (INDEPENDENT_AMBULATORY_CARE_PROVIDER_SITE_OTHER): Payer: Self-pay | Admitting: Urology

## 2017-01-17 DIAGNOSIS — N200 Calculus of kidney: Secondary | ICD-10-CM

## 2017-01-22 ENCOUNTER — Ambulatory Visit (HOSPITAL_COMMUNITY): Payer: Self-pay

## 2017-01-24 ENCOUNTER — Ambulatory Visit (HOSPITAL_COMMUNITY)
Admission: RE | Admit: 2017-01-24 | Discharge: 2017-01-24 | Disposition: A | Discharge status: Home / Self Care | Payer: Self-pay | Source: Ambulatory Visit | Attending: Sports Medicine | Admitting: Sports Medicine

## 2017-01-24 DIAGNOSIS — S83412A Sprain of medial collateral ligament of left knee, initial encounter: Secondary | ICD-10-CM | POA: Insufficient documentation

## 2017-01-24 DIAGNOSIS — M25562 Pain in left knee: Secondary | ICD-10-CM | POA: Insufficient documentation

## 2017-01-24 DIAGNOSIS — M25462 Effusion, left knee: Secondary | ICD-10-CM | POA: Insufficient documentation

## 2017-01-24 DIAGNOSIS — G8929 Other chronic pain: Secondary | ICD-10-CM

## 2017-01-24 DIAGNOSIS — M94262 Chondromalacia, left knee: Secondary | ICD-10-CM | POA: Insufficient documentation

## 2017-01-24 DIAGNOSIS — M7052 Other bursitis of knee, left knee: Secondary | ICD-10-CM | POA: Insufficient documentation

## 2017-01-24 DIAGNOSIS — X58XXXA Exposure to other specified factors, initial encounter: Secondary | ICD-10-CM | POA: Insufficient documentation

## 2017-01-25 ENCOUNTER — Ambulatory Visit (INDEPENDENT_AMBULATORY_CARE_PROVIDER_SITE_OTHER): Payer: Self-pay | Admitting: Sports Medicine

## 2017-01-25 ENCOUNTER — Encounter: Payer: Self-pay | Admitting: Sports Medicine

## 2017-01-25 VITALS — BP 130/86 | Ht 65.0 in | Wt 221.0 lb

## 2017-01-25 DIAGNOSIS — M25562 Pain in left knee: Secondary | ICD-10-CM

## 2017-01-25 DIAGNOSIS — G8929 Other chronic pain: Secondary | ICD-10-CM

## 2017-01-26 NOTE — Progress Notes (Signed)
   Subjective:    Patient ID: Michaela Aguilar, female    DOB: [birth year], 51 y.o.   MRN: [id number]  HPI   Patient comes in today to discuss MRI findings of her left knee. She has a 5 mm free osteochondral fragment anterior to the anterior horn of the lateral meniscus. Minimal degenerative changes. There is fluid around the proximal MCL but the patient denies any history of trauma to her knee. Her knee pain began 2 months ago. She thinks that it is compensatory from an altered gait due to her radiculopathy. She gets significant mechanical symptoms including locking and giving way. She has started to develop swelling as well.    Review of Systems    as above Objective:   Physical Exam  Well-developed, well-nourished. No acute distress  Left knee: There is moderate quad atrophy. Her range of motion today is 0-130. Trace effusion. Good ligamentous stability. Neurovascularly intact distally. Ambulating with the assistance of a cane.  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 Although she does not recall any injury, I do agree that this is likely compensatory from her altered gait due to her sciatica. I will refer the patient to Dr. Ninfa Linden at [facility] to discuss merits of arthroscopy. Patient will follow-up with me at some point after that consultation.

## 2017-02-19 ENCOUNTER — Ambulatory Visit (INDEPENDENT_AMBULATORY_CARE_PROVIDER_SITE_OTHER): Payer: Self-pay | Admitting: Orthopaedic Surgery

## 2017-02-19 DIAGNOSIS — G8929 Other chronic pain: Secondary | ICD-10-CM

## 2017-02-19 DIAGNOSIS — M25562 Pain in left knee: Secondary | ICD-10-CM

## 2017-02-19 MED ORDER — METHYLPREDNISOLONE ACETATE 40 MG/ML IJ SUSP
40.0000 mg | INTRAMUSCULAR | Status: AC | PRN
  Administered 2017-02-19: 40 mg via INTRA_ARTICULAR

## 2017-02-19 NOTE — Progress Notes (Signed)
Office Visit Note   Patient: Michaela Aguilar           Date of Birth: 1966-08-05           MRN: [id number] Visit Date: 02/19/2017              Requested by: Michaela Coyer, DO [id number] [place], [place] [address] PCP: Michaela Dryer, PA-C   Assessment & Plan: Visit Diagnoses:  1. Chronic pain of left knee     Plan: We will try hinged knee brace for her knee and have her work on quad strengthening exercises. He tolerated the steroid injection well. I like see her back in just 2 weeks to determine whether or not she is benefited from this and whether or not we feel that surgery could help her.  Follow-Up Instructions: Return in about 2 weeks (around 03/05/2017).   Orders:  No orders of the defined types were placed in this encounter.  No orders of the defined types were placed in this encounter.     Procedures: Large Joint Inj Date/Time: 02/19/2017 4:43 PM Performed by: Mcarthur Rossetti Authorized by: Mcarthur Rossetti   Location:  Knee Site:  L knee Ultrasound Guidance: No   Fluoroscopic Guidance: No   Arthrogram: No   Medications:  40 mg methylPREDNISolone acetate 40 MG/ML     Clinical Data: No additional findings.   Subjective: No chief complaint on file. The patient comes in with chief complaint of left knee pain. She is referred by Dr. Micheline Aguilar due to an MRI showing a questionable loose body in her knee. Her pain though is been chronic. She's been on and off pain medications. She's never had a steroid injection that knee and is never worn a knee brace for work on quad training exercises. She was sent to me just due to the question will findings on MRI. She says the knee gives out overall the time it hurts all the time whether she is doing activities or not.  HPI  Review of Systems She denies any headache, chest pain, short of breath, fever, chills, nausea, vomiting.  Objective: Vital Signs: LMP 02/16/2014   Physical Exam She is alert  and oriented 3 and underwent a cane in no acute distress Ortho Exam Examination of her left knee show some pain with range of motion but there is no effusion. The knee is ligamentously stable on exam and the range of motion is full. Her patella tracks normally. Specialty Comments:  No specialty comments available.  Imaging: No results found. I independently reviewed an MRI of her left knee. There is questionable loose body in her knee but the cartilage is intact throughout the knee with just slight thinning. Her meniscus and ligaments are all intact. The patellofemoral ligament is also intact. The anterior cruciate ligament and PCL looked pristine.  PMFS History: Patient Active Problem List   Diagnosis Date Noted  . Prediabetes 10/26/2016  . Class 2 obesity with body mass index (BMI) of 38.0 to 38.9 in adult 10/26/2016  . Neck pain 11/17/2015  . Low back pain 11/17/2015   Past Medical History:  Diagnosis Date  . Anxiety   . Bronchitis   . Depression   . Gestational diabetes    1992  . History of kidney stones   . Kidney stones per pt.    Family History  Problem Relation Age of Onset  . Osteoporosis Mother   . Depression Father   . Mental illness Father   .  Cancer Maternal Uncle     lung cancer  . COPD Maternal Uncle   . Depression Maternal Uncle   . Emphysema Maternal Grandmother   . Cancer Maternal Grandfather     lung cancer    Past Surgical History:  Procedure Laterality Date  . CESAREAN SECTION     x2  . CYSTOSCOPY WITH STENT PLACEMENT Right 09/06/2016   Procedure: CYSTOSCOPY WITH RIGHT URETERAL STENT PLACEMENT;  Surgeon: Cleon Gustin, MD;  Location: [facility];  Service: Urology;  Laterality: Right;  . CYSTOSCOPY/RETROGRADE/URETEROSCOPY/STONE EXTRACTION WITH BASKET Right 09/06/2016   Procedure: CYSTOSCOPY/RIGHT RETROGRADE PYELOGRAM, RIGHT RENAL STONE EXTRACTION WITH LASER;  Surgeon: Cleon Gustin, MD;  Location: [facility];  Service: Urology;  Laterality:  Right;  . [place] 2010  . HERNIA REPAIR     incisional hernia   Social History   Occupational History  . Not on file.   Social History Main Topics  . Smoking status: Former Smoker    Packs/day: 0.50    Years: 15.00    Types: Cigarettes, Cigars    Quit date: 2012  . Smokeless tobacco: Never Used     Comment: quit smoking cigarretes 2012. pt states she smokes 1 black and mild every 3 days  . Alcohol use No  . Drug use: No     Comment: last marijuana use early October 2017  . Sexual activity: Yes    Birth control/ protection: None, Post-menopausal

## 2017-02-27 ENCOUNTER — Other Ambulatory Visit: Payer: Self-pay | Admitting: Urology

## 2017-02-27 DIAGNOSIS — N200 Calculus of kidney: Secondary | ICD-10-CM

## 2017-02-28 ENCOUNTER — Other Ambulatory Visit: Payer: Self-pay | Admitting: Urology

## 2017-02-28 DIAGNOSIS — N200 Calculus of kidney: Secondary | ICD-10-CM

## 2017-03-06 ENCOUNTER — Ambulatory Visit (INDEPENDENT_AMBULATORY_CARE_PROVIDER_SITE_OTHER): Payer: Self-pay | Admitting: Orthopaedic Surgery

## 2017-03-16 ENCOUNTER — Ambulatory Visit (HOSPITAL_COMMUNITY)
Admission: RE | Admit: 2017-03-16 | Discharge: 2017-03-16 | Disposition: A | Discharge status: Home / Self Care | Payer: Self-pay | Source: Ambulatory Visit | Attending: Urology | Admitting: Urology

## 2017-03-19 ENCOUNTER — Other Ambulatory Visit (HOSPITAL_COMMUNITY)
Admission: RE | Admit: 2017-03-19 | Discharge: 2017-03-19 | Disposition: A | Discharge status: Home / Self Care | Payer: Self-pay | Source: Ambulatory Visit | Attending: Physician Assistant | Admitting: Physician Assistant

## 2017-03-20 LAB — HEMOGLOBIN A1C
Hgb A1c MFr Bld: 6.5 % — ABNORMAL HIGH (ref 4.8–5.6)
Mean Plasma Glucose: 140 mg/dL

## 2017-03-21 ENCOUNTER — Ambulatory Visit (HOSPITAL_COMMUNITY): Payer: Self-pay

## 2017-03-22 ENCOUNTER — Encounter: Payer: Self-pay | Admitting: Physician Assistant

## 2017-03-22 ENCOUNTER — Ambulatory Visit: Payer: Self-pay | Admitting: Physician Assistant

## 2017-03-22 ENCOUNTER — Other Ambulatory Visit (HOSPITAL_COMMUNITY)
Admission: RE | Admit: 2017-03-22 | Discharge: 2017-03-22 | Disposition: A | Discharge status: Home / Self Care | Payer: Self-pay | Source: Ambulatory Visit | Attending: Physician Assistant | Admitting: Physician Assistant

## 2017-03-22 VITALS — BP 118/73 | HR 83 | Temp 97.7°F | Ht 65.0 in | Wt 225.5 lb

## 2017-03-22 DIAGNOSIS — G8929 Other chronic pain: Secondary | ICD-10-CM

## 2017-03-22 DIAGNOSIS — E1165 Type 2 diabetes mellitus with hyperglycemia: Secondary | ICD-10-CM | POA: Insufficient documentation

## 2017-03-22 DIAGNOSIS — F39 Unspecified mood [affective] disorder: Secondary | ICD-10-CM

## 2017-03-22 DIAGNOSIS — E669 Obesity, unspecified: Secondary | ICD-10-CM

## 2017-03-22 DIAGNOSIS — E119 Type 2 diabetes mellitus without complications: Secondary | ICD-10-CM

## 2017-03-22 MED ORDER — METFORMIN HCL ER 500 MG PO TB24: 500.0000 mg | ORAL_TABLET | Freq: Every day | ORAL | 4 refills | Status: DC

## 2017-03-22 NOTE — Progress Notes (Signed)
BP 118/73 (BP Location: Left Arm, Patient Position: Sitting, Cuff Size: Large)   Pulse 83   Temp 97.7 F (36.5 C) (Other (Comment))   Ht 5\' 5"  (1.651 m)   Wt 225 lb 8 oz (102.3 kg)   LMP 02/16/2014   SpO2 96%   BMI 37.53 kg/m    Subjective:    Patient ID: Michaela Aguilar, female    DOB: Feb 01, 1966, 51 y.o.   MRN: [id number]  HPI: Michaela Aguilar is a 51 y.o. female presenting on 03/22/2017 for Follow-up (prediabetes)   HPI   [facility] goes [facility]  She says she is doing well.    Relevant past medical, surgical, family and social history reviewed and updated as indicated. Interim medical history since our last visit reviewed. Allergies and medications reviewed and updated.   Current Outpatient Prescriptions:  .  ALPRAZolam (XANAX) 1 MG tablet, Take 1 mg by mouth every 6 (six) hours as needed for anxiety. For pain, Disp: , Rfl:  .  amitriptyline (ELAVIL) 25 MG tablet, Take 25 mg by mouth at bedtime., Disp: , Rfl:  .  citalopram (CELEXA) 40 MG tablet, Take 40 mg by mouth daily., Disp: , Rfl:  .  diclofenac (VOLTAREN) 75 MG EC tablet, Take 1 tablet (75 mg total) by mouth 2 (two) times daily as needed. Take with food, Disp: 60 tablet, Rfl: 1 .  gabapentin (NEURONTIN) 300 MG capsule, Take 1 capsule (300 mg total) by mouth at bedtime as needed., Disp: 60 capsule, Rfl: 1 .  Multiple Vitamins-Minerals (MULTIVITAMIN WITH MINERALS) tablet, Take 1 tablet by mouth daily., Disp: , Rfl:    Review of Systems  Constitutional: Positive for fatigue. Negative for appetite change, chills, diaphoresis, fever and unexpected weight change.  HENT: Negative for congestion, drooling, ear pain, facial swelling, hearing loss, mouth sores, sneezing, sore throat, trouble swallowing and voice change.   Eyes: Positive for visual disturbance. Negative for pain, discharge, redness and itching.  Respiratory: Negative for cough, choking, shortness of breath and wheezing.   Cardiovascular: Positive for palpitations.  Negative for chest pain and leg swelling.  Gastrointestinal: Negative for abdominal pain, blood in stool, constipation, diarrhea and vomiting.  Endocrine: Negative for cold intolerance, heat intolerance and polydipsia.  Genitourinary: Negative for decreased urine volume, dysuria and hematuria.  Musculoskeletal: Positive for arthralgias and gait problem. Negative for back pain.  Skin: Negative for rash.  Allergic/Immunologic: Negative for environmental allergies.  Neurological: Negative for seizures, syncope, light-headedness and headaches.  Hematological: Negative for adenopathy.  Psychiatric/Behavioral: Positive for dysphoric mood. Negative for agitation and suicidal ideas. The patient is nervous/anxious.     Per HPI unless specifically indicated above     Objective:    BP 118/73 (BP Location: Left Arm, Patient Position: Sitting, Cuff Size: Large)   Pulse 83   Temp 97.7 F (36.5 C) (Other (Comment))   Ht 5\' 5"  (1.651 m)   Wt 225 lb 8 oz (102.3 kg)   LMP 02/16/2014   SpO2 96%   BMI 37.53 kg/m   Wt Readings from Last 3 Encounters:  03/22/17 225 lb 8 oz (102.3 kg)  01/25/17 221 lb (100.2 kg)  01/15/17 221 lb (100.2 kg)    Physical Exam  Constitutional: She is oriented to person, place, and time. She appears well-developed and well-nourished.  HENT:  Head: Normocephalic and atraumatic.  Neck: Neck supple.  Cardiovascular: Normal rate and regular rhythm.   Pulmonary/Chest: Effort normal and breath sounds normal.  Abdominal: Soft. Bowel sounds  are normal. She exhibits no mass. There is no hepatosplenomegaly. There is no tenderness.  Musculoskeletal: She exhibits no edema.  Lymphadenopathy:    She has no cervical adenopathy.  Neurological: She is alert and oriented to person, place, and time.  Skin: Skin is warm and dry.  Psychiatric: She has a normal mood and affect. Her behavior is normal.  Vitals reviewed.   Results for orders placed or performed during the hospital  encounter of 03/19/17  Hemoglobin A1c  Result Value Ref Range   Hgb A1c MFr Bld 6.5 (H) 4.8 - 5.6 %   Mean Plasma Glucose 140 mg/dL      Assessment & Plan:   Encounter Diagnoses  Name Primary?  . Controlled type 2 diabetes mellitus without complication, without long-term current use of insulin ([place]) Yes  . Obesity, unspecified classification, unspecified obesity type, unspecified whether serious comorbidity present   . Mood disorder ([place])   . Other chronic pain     -reviewed labs with pt. -pt counseled on diabetes and diabetic diet. She is given reading information -pt to attend Dm education class -will order Dm eye exam -rx metformin -urine sent for microalbumin -pt to follow up 3 months.  RTO sooner prn

## 2017-03-22 NOTE — Patient Instructions (Signed)
Diabetes Mellitus and Food It is important for you to manage your blood sugar (glucose) level. Your blood glucose level can be greatly affected by what you eat. Eating healthier foods in the appropriate amounts throughout the day at about the same time each day will help you control your blood glucose level. It can also help slow or prevent worsening of your diabetes mellitus. Healthy eating may even help you improve the level of your blood pressure and reach or maintain a healthy weight. General recommendations for healthful eating and cooking habits include:  Eating meals and snacks regularly. Avoid going long periods of time without eating to lose weight.  Eating a diet that consists mainly of plant-based foods, such as fruits, vegetables, nuts, legumes, and whole grains.  Using low-heat cooking methods, such as baking, instead of high-heat cooking methods, such as deep frying.  Work with your dietitian to make sure you understand how to use the Nutrition Facts information on food labels. How can food affect me? Carbohydrates Carbohydrates affect your blood glucose level more than any other type of food. Your dietitian will help you determine how many carbohydrates to eat at each meal and teach you how to count carbohydrates. Counting carbohydrates is important to keep your blood glucose at a healthy level, especially if you are using insulin or taking certain medicines for diabetes mellitus. Alcohol Alcohol can cause sudden decreases in blood glucose (hypoglycemia), especially if you use insulin or take certain medicines for diabetes mellitus. Hypoglycemia can be a life-threatening condition. Symptoms of hypoglycemia (sleepiness, dizziness, and disorientation) are similar to symptoms of having too much alcohol. If your health care provider has given you approval to drink alcohol, do so in moderation and use the following guidelines:  Women should not have more than one drink per day, and men  should not have more than two drinks per day. One drink is equal to: ? 12 oz of beer. ? 5 oz of wine. ? 1 oz of hard liquor.  Do not drink on an empty stomach.  Keep yourself hydrated. Have water, diet soda, or unsweetened iced tea.  Regular soda, juice, and other mixers might contain a lot of carbohydrates and should be counted.  What foods are not recommended? As you make food choices, it is important to remember that all foods are not the same. Some foods have fewer nutrients per serving than other foods, even though they might have the same number of calories or carbohydrates. It is difficult to get your body what it needs when you eat foods with fewer nutrients. Examples of foods that you should avoid that are high in calories and carbohydrates but low in nutrients include:  Trans fats (most processed foods list trans fats on the Nutrition Facts label).  Regular soda.  Juice.  Candy.  Sweets, such as cake, pie, doughnuts, and cookies.  Fried foods.  What foods can I eat? Eat nutrient-rich foods, which will nourish your body and keep you healthy. The food you should eat also will depend on several factors, including:  The calories you need.  The medicines you take.  Your weight.  Your blood glucose level.  Your blood pressure level.  Your cholesterol level.  You should eat a variety of foods, including:  Protein. ? Lean cuts of meat. ? Proteins low in saturated fats, such as fish, egg whites, and beans. Avoid processed meats.  Fruits and vegetables. ? Fruits and vegetables that may help control blood glucose levels, such as apples,   mangoes, and yams.  Dairy products. ? Choose fat-free or low-fat dairy products, such as milk, yogurt, and cheese.  Grains, bread, pasta, and rice. ? Choose whole grain products, such as multigrain bread, whole oats, and brown rice. These foods may help control blood pressure.  Fats. ? Foods containing healthful fats, such as  nuts, avocado, olive oil, canola oil, and fish.  Does everyone with diabetes mellitus have the same meal plan? Because every person with diabetes mellitus is different, there is not one meal plan that works for everyone. It is very important that you meet with a dietitian who will help you create a meal plan that is just right for you. This information is not intended to replace advice given to you by your health care provider. Make sure you discuss any questions you have with your health care provider. Document Released: 07/27/2005 Document Revised: 04/06/2016 Document Reviewed: 09/26/2013 Elsevier Interactive Patient Education  2017 Elsevier Inc.  

## 2017-03-23 LAB — MICROALBUMIN, URINE: MICROALB UR: 9.8 ug/mL — AB

## 2017-03-27 ENCOUNTER — Ambulatory Visit (INDEPENDENT_AMBULATORY_CARE_PROVIDER_SITE_OTHER): Payer: Self-pay | Admitting: Orthopaedic Surgery

## 2017-03-29 ENCOUNTER — Other Ambulatory Visit: Payer: Self-pay | Admitting: Physician Assistant

## 2017-03-29 ENCOUNTER — Telehealth: Payer: Self-pay | Admitting: Student

## 2017-03-29 MED ORDER — GLIPIZIDE 5 MG PO TABS: 5.0000 mg | ORAL_TABLET | Freq: Every day | ORAL | 3 refills | Status: DC

## 2017-03-29 NOTE — Telephone Encounter (Addendum)
Pt called c/o dizziness, feeling like passing out, having a nervous feeling, and shaking. Pt states this began when she started taking metformin.  PA advises to d/c metformin and start glipizide. Glipizide has been sent to [facility] as requested by the patient.   Pt notified and pt verbalizes understanding.

## 2017-04-03 ENCOUNTER — Ambulatory Visit (INDEPENDENT_AMBULATORY_CARE_PROVIDER_SITE_OTHER): Payer: Self-pay | Admitting: Orthopaedic Surgery

## 2017-04-03 ENCOUNTER — Encounter (INDEPENDENT_AMBULATORY_CARE_PROVIDER_SITE_OTHER): Payer: Self-pay | Admitting: Orthopaedic Surgery

## 2017-04-03 VITALS — Ht 65.0 in | Wt 225.0 lb

## 2017-04-03 DIAGNOSIS — G8929 Other chronic pain: Secondary | ICD-10-CM

## 2017-04-03 DIAGNOSIS — M25562 Pain in left knee: Secondary | ICD-10-CM

## 2017-04-03 HISTORY — DX: Other chronic pain: G89.29

## 2017-04-03 HISTORY — DX: Pain in left knee: M25.562

## 2017-04-03 NOTE — Progress Notes (Signed)
The patient is following up after an intra-articular steroid injection in her left knee. This was done in early April. She said she has done very well from that injection and does not have the same issues. We had considered an arthroscopic intervention due to questionable loose body in her knee but all ligamentous structures her knee were intact. She denies any specific issues now with her knee and feels like she is getting much better. She will to consider another injection once that's indicated.  On examination of her left knee the knee is ligamentous is stable. Is no effusion and her range of motion is full.  I'll see her back in 4 weeks and I would feel comfortable that visit placing another steroid injection in her knee and she wishes for this as well. She said she barely uses her cane even though she is using it today. She is still work on Publishing rights manager.

## 2017-05-01 ENCOUNTER — Encounter (INDEPENDENT_AMBULATORY_CARE_PROVIDER_SITE_OTHER): Payer: Self-pay | Admitting: Orthopaedic Surgery

## 2017-05-01 ENCOUNTER — Ambulatory Visit (INDEPENDENT_AMBULATORY_CARE_PROVIDER_SITE_OTHER): Payer: Self-pay

## 2017-05-01 ENCOUNTER — Ambulatory Visit (INDEPENDENT_AMBULATORY_CARE_PROVIDER_SITE_OTHER): Payer: Self-pay | Admitting: Physician Assistant

## 2017-05-01 VITALS — Ht 65.0 in | Wt 225.0 lb

## 2017-05-01 DIAGNOSIS — M25562 Pain in left knee: Secondary | ICD-10-CM

## 2017-05-01 DIAGNOSIS — G8929 Other chronic pain: Secondary | ICD-10-CM

## 2017-05-01 DIAGNOSIS — M79671 Pain in right foot: Secondary | ICD-10-CM

## 2017-05-01 NOTE — Progress Notes (Signed)
Office Visit Note   Patient: Michaela Aguilar           Date of Birth: 12/13/1965           MRN: [id number] Visit Date: 05/01/2017              Requested by: Soyla Dryer, PA-C [address] [place], [place] [address] PCP: Soyla Dryer, PA-C   Assessment & Plan: Visit Diagnoses:  1. Pain in right foot   2. Chronic pain of left knee   Differential right foot Morton's neuroma  Plan: We will have her continue to work on quad strengthening left knee. Would not recommend injections but every 3 months in the knee if needed. Right foot pain talk to her about shoewear. We'll have her start applying Pennsaid and 4 g of the 4 times daily to the area of maximal tenderness. Also talked to her about stretching exercises for MTP joints. See her back in 6 weeks to check on both the knee and foot.  Follow-Up Instructions: Return in about 6 weeks (around 06/12/2017).   Orders:  Orders Placed This Encounter  Procedures  . XR Foot Complete Right   No orders of the defined types were placed in this encounter.     Procedures: No procedures performed   Clinical Data: No additional findings.   Subjective: Chief Complaint  Patient presents with  . Left Knee - Pain    HPI Sleep comes in today with complaint of left knee pain. She last seen by Dr. Ninfa Linden in April at that time was given a cortisone injection which she states helped until just a week ago when her pain returned. Pain is not as severe as it was. She's having no since age and sensation of catching locking or painful popping she does have some giving way sensation in this began a week ago. She is asking about an injection at this time. She is diabetic. Last hemoglobin A1c was 6.5. Next exam right foot pain began within the last month. No known injury. She does not wear a lot of open back shoes. Pain is over the lateral aspect of the foot. Mostly located in the third webspace area. She is unsure if a tight shoe causing her pain to  become worse. Review of Systems Please see history of present illness otherwise negative  Objective: Vital Signs: Ht 5\' 5"  (1.651 m)   Wt 225 lb (102.1 kg)   LMP 02/16/2014   BMI 37.44 kg/m   Physical Exam  Constitutional: She is oriented to person, place, and time. She appears well-developed and well-nourished. No distress.  Pulmonary/Chest: Effort normal.  Neurological: She is alert and oriented to person, place, and time.  Psychiatric: She has a normal mood and affect. Her behavior is normal.    Ortho Exam Left knee good range of motion. No instability valgus varus stressing. No effusion abnormal warmth erythema or ecchymosis. Right foot dorsal pedal pulses present. Sensation grossly intact throughout the foot. She has tenderness at the third webspace. Negative Mulder's click. Some tenderness over the fifth metatarsal shaft to head. Also tenderness over the third and fourth metatarsal shafts. Negative piano key test third fourth and fifth metatarsals. Specialty Comments:  No specialty comments available.  Imaging: Xr Foot Complete Right  Result Date: 05/01/2017 [facility], lateral and oblique right foot: No acute fracture. No bony abnormalities particularly of the third fourth fifth metatarsals. No dislocation or malalignment of the Lisfranc joints.    PMFS History: Patient Active  Problem List   Diagnosis Date Noted  . Chronic pain of left knee 04/03/2017  . Prediabetes 10/26/2016  . Class 2 obesity with body mass index (BMI) of 38.0 to 38.9 in adult 10/26/2016  . Neck pain 11/17/2015  . Low back pain 11/17/2015   Past Medical History:  Diagnosis Date  . Anxiety   . Bronchitis   . Depression   . Gestational diabetes    1992  . History of kidney stones   . Kidney stones per pt.    Family History  Problem Relation Age of Onset  . Osteoporosis Mother   . Depression Father   . Mental illness Father   . Cancer Maternal Uncle        lung cancer  . COPD Maternal Uncle     . Depression Maternal Uncle   . Emphysema Maternal Grandmother   . Cancer Maternal Grandfather        lung cancer    Past Surgical History:  Procedure Laterality Date  . CESAREAN SECTION     x2  . CYSTOSCOPY WITH STENT PLACEMENT Right 09/06/2016   Procedure: CYSTOSCOPY WITH RIGHT URETERAL STENT PLACEMENT;  Surgeon: Cleon Gustin, MD;  Location: [facility];  Service: Urology;  Laterality: Right;  . CYSTOSCOPY/RETROGRADE/URETEROSCOPY/STONE EXTRACTION WITH BASKET Right 09/06/2016   Procedure: CYSTOSCOPY/RIGHT RETROGRADE PYELOGRAM, RIGHT RENAL STONE EXTRACTION WITH LASER;  Surgeon: Cleon Gustin, MD;  Location: [facility];  Service: Urology;  Laterality: Right;  . [place] 2010  . HERNIA REPAIR     incisional hernia   Social History   Occupational History  . Not on file.   Social History Main Topics  . Smoking status: Former Smoker    Packs/day: 0.50    Years: 15.00    Types: Cigarettes, Cigars    Quit date: 2012  . Smokeless tobacco: Never Used     Comment: quit smoking cigarretes 2012. pt states she smokes 1 black and mild every 3 days  . Alcohol use No  . Drug use: No     Comment: last marijuana use early October 2017  . Sexual activity: Yes    Birth control/ protection: None, Post-menopausal

## 2017-05-02 ENCOUNTER — Other Ambulatory Visit: Payer: Self-pay | Admitting: Physician Assistant

## 2017-05-02 ENCOUNTER — Other Ambulatory Visit (INDEPENDENT_AMBULATORY_CARE_PROVIDER_SITE_OTHER): Payer: Self-pay

## 2017-05-02 ENCOUNTER — Ambulatory Visit (HOSPITAL_COMMUNITY)
Admission: RE | Admit: 2017-05-02 | Discharge: 2017-05-02 | Disposition: A | Discharge status: Home / Self Care | Payer: Self-pay | Source: Ambulatory Visit | Attending: Urology | Admitting: Urology

## 2017-05-02 DIAGNOSIS — N200 Calculus of kidney: Secondary | ICD-10-CM | POA: Insufficient documentation

## 2017-05-02 DIAGNOSIS — E119 Type 2 diabetes mellitus without complications: Secondary | ICD-10-CM

## 2017-05-02 MED ORDER — DICLOFENAC SODIUM 2 % TD SOLN: 2.0000 g | Freq: Two times a day (BID) | TRANSDERMAL | 2 refills | Status: AC

## 2017-05-09 ENCOUNTER — Ambulatory Visit (INDEPENDENT_AMBULATORY_CARE_PROVIDER_SITE_OTHER): Payer: Self-pay | Admitting: Urology

## 2017-05-09 DIAGNOSIS — N200 Calculus of kidney: Secondary | ICD-10-CM

## 2017-05-29 ENCOUNTER — Ambulatory Visit (HOSPITAL_COMMUNITY)
Admission: RE | Admit: 2017-05-29 | Discharge: 2017-05-29 | Disposition: A | Discharge status: Home / Self Care | Payer: Self-pay | Source: Ambulatory Visit | Attending: Urology | Admitting: Urology

## 2017-05-29 ENCOUNTER — Other Ambulatory Visit: Payer: Self-pay | Admitting: Urology

## 2017-05-29 DIAGNOSIS — N200 Calculus of kidney: Secondary | ICD-10-CM

## 2017-05-30 ENCOUNTER — Ambulatory Visit (INDEPENDENT_AMBULATORY_CARE_PROVIDER_SITE_OTHER): Payer: Self-pay | Admitting: Urology

## 2017-05-30 DIAGNOSIS — N200 Calculus of kidney: Secondary | ICD-10-CM

## 2017-06-11 ENCOUNTER — Telehealth (INDEPENDENT_AMBULATORY_CARE_PROVIDER_SITE_OTHER): Payer: Self-pay | Admitting: Orthopaedic Surgery

## 2017-06-11 NOTE — Telephone Encounter (Signed)
Records&bills faxed to [facility]

## 2017-06-14 ENCOUNTER — Encounter: Payer: Self-pay | Admitting: Physician Assistant

## 2017-06-18 ENCOUNTER — Other Ambulatory Visit (HOSPITAL_COMMUNITY)
Admission: RE | Admit: 2017-06-18 | Discharge: 2017-06-18 | Disposition: A | Discharge status: Home / Self Care | Payer: Self-pay | Source: Ambulatory Visit | Attending: Physician Assistant | Admitting: Physician Assistant

## 2017-06-18 ENCOUNTER — Ambulatory Visit (INDEPENDENT_AMBULATORY_CARE_PROVIDER_SITE_OTHER): Payer: Self-pay | Admitting: Physician Assistant

## 2017-06-18 DIAGNOSIS — E119 Type 2 diabetes mellitus without complications: Secondary | ICD-10-CM | POA: Insufficient documentation

## 2017-06-18 LAB — BASIC METABOLIC PANEL
ANION GAP: 6 (ref 5–15)
BUN: 11 mg/dL (ref 6–20)
CHLORIDE: 103 mmol/L (ref 101–111)
CO2: 27 mmol/L (ref 22–32)
CREATININE: 0.6 mg/dL (ref 0.44–1.00)
Calcium: 10.1 mg/dL (ref 8.9–10.3)
GFR calc non Af Amer: >60 mL/min (ref >60)
Glucose, Bld: 127 mg/dL — ABNORMAL HIGH (ref 65–99)
POTASSIUM: 4.3 mmol/L (ref 3.5–5.1)
SODIUM: 136 mmol/L (ref 135–145)

## 2017-06-19 LAB — HEMOGLOBIN A1C
Hgb A1c MFr Bld: 6.2 % — ABNORMAL HIGH (ref 4.8–5.6)
Mean Plasma Glucose: 131 mg/dL

## 2017-06-20 ENCOUNTER — Ambulatory Visit: Payer: Self-pay | Admitting: Physician Assistant

## 2017-06-20 ENCOUNTER — Other Ambulatory Visit: Payer: Self-pay | Admitting: Physician Assistant

## 2017-06-20 ENCOUNTER — Encounter: Payer: Self-pay | Admitting: Physician Assistant

## 2017-06-20 VITALS — BP 130/70 | HR 65 | Temp 97.7°F | Wt 230.0 lb

## 2017-06-20 DIAGNOSIS — F39 Unspecified mood [affective] disorder: Secondary | ICD-10-CM

## 2017-06-20 DIAGNOSIS — E669 Obesity, unspecified: Secondary | ICD-10-CM

## 2017-06-20 DIAGNOSIS — Z1211 Encounter for screening for malignant neoplasm of colon: Secondary | ICD-10-CM

## 2017-06-20 DIAGNOSIS — G8929 Other chronic pain: Secondary | ICD-10-CM

## 2017-06-20 DIAGNOSIS — E119 Type 2 diabetes mellitus without complications: Secondary | ICD-10-CM

## 2017-06-20 NOTE — Progress Notes (Signed)
BP 130/70 (BP Location: Left Arm, Patient Position: Sitting, Cuff Size: Normal)   Pulse 65   Temp 97.7 F (36.5 C)   Wt 230 lb (104.3 kg)   LMP 02/16/2014   SpO2 97%   BMI 38.27 kg/m    Subjective:    Patient ID: Michaela Aguilar, female    DOB: 04-05-1966, 51 y.o.   MRN: [id number]  HPI: Michaela Aguilar is a 51 y.o. female presenting on 06/20/2017 for Diabetes   HPI   Pt went to [facility] education exam  Pt asks about cataracts.  She was told she has those at her [facility] eye exam and she says she "really can't see".  She asks if  [facility] (which she has) would be able to have her seen by eye dr for this.   Pt is still going to [place] says she is feeling well.  Relevant past medical, surgical, family and social history reviewed and updated as indicated. Interim medical history since our last visit reviewed. Allergies and medications reviewed and updated.   Current Outpatient Prescriptions:  .  ALPRAZolam (XANAX) 1 MG tablet, Take 1 mg by mouth every 6 (six) hours as needed for anxiety. For pain, Disp: , Rfl:  .  amitriptyline (ELAVIL) 25 MG tablet, Take 25 mg by mouth at bedtime., Disp: , Rfl:  .  citalopram (CELEXA) 40 MG tablet, Take 40 mg by mouth daily., Disp: , Rfl:  .  diclofenac (VOLTAREN) 75 MG EC tablet, Take 1 tablet (75 mg total) by mouth 2 (two) times daily as needed. Take with food, Disp: 60 tablet, Rfl: 1 .  gabapentin (NEURONTIN) 300 MG capsule, Take 1 capsule (300 mg total) by mouth at bedtime as needed., Disp: 60 capsule, Rfl: 1 .  glipiZIDE (GLUCOTROL) 5 MG tablet, Take 1 tablet (5 mg total) by mouth daily before breakfast., Disp: 30 tablet, Rfl: 3 .  metFORMIN (GLUCOPHAGE XR) 500 MG 24 hr tablet, Take 1 tablet (500 mg total) by mouth daily with breakfast., Disp: 30 tablet, Rfl: 4 .  Multiple Vitamins-Minerals (MULTIVITAMIN WITH MINERALS) tablet, Take 1 tablet by mouth daily., Disp: , Rfl:    Review of Systems  Constitutional: Negative for appetite change, chills,  diaphoresis, fatigue, fever and unexpected weight change.  HENT: Negative for congestion, dental problem, drooling, ear pain, facial swelling, hearing loss, mouth sores, sneezing, sore throat, trouble swallowing and voice change.   Eyes: Positive for visual disturbance. Negative for pain, discharge, redness and itching.  Respiratory: Negative for cough, choking, shortness of breath and wheezing.   Cardiovascular: Negative for chest pain, palpitations and leg swelling.  Gastrointestinal: Negative for abdominal pain, blood in stool, constipation, diarrhea and vomiting.  Endocrine: Negative for cold intolerance, heat intolerance and polydipsia.  Genitourinary: Negative for decreased urine volume, dysuria and hematuria.  Musculoskeletal: Negative for arthralgias, back pain and gait problem.  Skin: Negative for rash.  Allergic/Immunologic: Negative for environmental allergies.  Neurological: Negative for seizures, syncope, light-headedness and headaches.  Hematological: Negative for adenopathy.  Psychiatric/Behavioral: Negative for agitation, dysphoric mood and suicidal ideas. The patient is nervous/anxious.     Per HPI unless specifically indicated above     Objective:    BP 130/70 (BP Location: Left Arm, Patient Position: Sitting, Cuff Size: Normal)   Pulse 65   Temp 97.7 F (36.5 C)   Wt 230 lb (104.3 kg)   LMP 02/16/2014   SpO2 97%   BMI 38.27 kg/m   Wt Readings from Last  3 Encounters:  06/20/17 230 lb (104.3 kg)  05/01/17 225 lb (102.1 kg)  04/03/17 225 lb (102.1 kg)    Physical Exam  Constitutional: She is oriented to person, place, and time. She appears well-developed and well-nourished.  HENT:  Head: Normocephalic and atraumatic.  Neck: Neck supple.  Cardiovascular: Normal rate and regular rhythm.   Pulmonary/Chest: Effort normal and breath sounds normal.  Abdominal: Soft. Bowel sounds are normal. She exhibits no mass. There is no hepatosplenomegaly. There is no  tenderness.  Musculoskeletal: She exhibits no edema.  Lymphadenopathy:    She has no cervical adenopathy.  Neurological: She is alert and oriented to person, place, and time.  Skin: Skin is warm and dry.  Psychiatric: She has a normal mood and affect. Her behavior is normal.  Vitals reviewed.   Results for orders placed or performed during the hospital encounter of 00/51/10  Basic Metabolic Panel (BMET)  Result Value Ref Range   Sodium 136 135 - 145 mmol/L   Potassium 4.3 3.5 - 5.1 mmol/L   Chloride 103 101 - 111 mmol/L   CO2 27 22 - 32 mmol/L   Glucose, Bld 127 (H) 65 - 99 mg/dL   BUN 11 6 - 20 mg/dL   Creatinine, Ser 0.60 0.44 - 1.00 mg/dL   Calcium 10.1 8.9 - 10.3 mg/dL   GFR calc non Af Amer >60 >60 mL/min   GFR calc Af Amer >60 >60 mL/min   Anion gap 6 5 - 15  Hemoglobin A1c  Result Value Ref Range   Hgb A1c MFr Bld 6.2 (H) 4.8 - 5.6 %   Mean Plasma Glucose 131 mg/dL      Assessment & Plan:   Encounter Diagnoses  Name Primary?  . Controlled type 2 diabetes mellitus without complication, without long-term current use of insulin ([place]) Yes  . Mood disorder ([place])   . Other chronic pain   . Obesity, unspecified classification, unspecified obesity type, unspecified whether serious comorbidity present   . Screening for colon cancer     -reviewed labs with pt -pt to continue current medications -discussed with pt that I didn't think there is [facility], but would have nurse look into it -pt was given iFOBT for colon cancer screening -follow up 3 months.  RTO sooner prn

## 2017-06-25 ENCOUNTER — Ambulatory Visit (INDEPENDENT_AMBULATORY_CARE_PROVIDER_SITE_OTHER): Payer: Self-pay | Admitting: Physician Assistant

## 2017-07-03 ENCOUNTER — Other Ambulatory Visit: Payer: Self-pay | Admitting: Urology

## 2017-07-03 ENCOUNTER — Ambulatory Visit (HOSPITAL_COMMUNITY)
Admission: RE | Admit: 2017-07-03 | Discharge: 2017-07-03 | Disposition: A | Discharge status: Home / Self Care | Payer: Self-pay | Source: Ambulatory Visit | Attending: Urology | Admitting: Urology

## 2017-07-03 DIAGNOSIS — N200 Calculus of kidney: Secondary | ICD-10-CM | POA: Insufficient documentation

## 2017-07-04 ENCOUNTER — Ambulatory Visit (INDEPENDENT_AMBULATORY_CARE_PROVIDER_SITE_OTHER): Payer: Self-pay | Admitting: Urology

## 2017-07-04 DIAGNOSIS — N200 Calculus of kidney: Secondary | ICD-10-CM

## 2017-07-19 ENCOUNTER — Ambulatory Visit (INDEPENDENT_AMBULATORY_CARE_PROVIDER_SITE_OTHER): Payer: Self-pay | Admitting: Orthopaedic Surgery

## 2017-07-23 ENCOUNTER — Ambulatory Visit: Payer: Self-pay | Admitting: Sports Medicine

## 2017-07-24 ENCOUNTER — Other Ambulatory Visit: Payer: Self-pay | Admitting: Urology

## 2017-07-24 DIAGNOSIS — N200 Calculus of kidney: Secondary | ICD-10-CM

## 2017-08-10 ENCOUNTER — Ambulatory Visit (HOSPITAL_COMMUNITY)
Admission: RE | Admit: 2017-08-10 | Discharge: 2017-08-10 | Disposition: A | Discharge status: Home / Self Care | Payer: Self-pay | Source: Ambulatory Visit | Attending: Urology | Admitting: Urology

## 2017-08-10 DIAGNOSIS — D3502 Benign neoplasm of left adrenal gland: Secondary | ICD-10-CM | POA: Insufficient documentation

## 2017-08-10 DIAGNOSIS — N200 Calculus of kidney: Secondary | ICD-10-CM | POA: Insufficient documentation

## 2017-08-10 DIAGNOSIS — K439 Ventral hernia without obstruction or gangrene: Secondary | ICD-10-CM | POA: Insufficient documentation

## 2017-08-10 DIAGNOSIS — K5730 Diverticulosis of large intestine without perforation or abscess without bleeding: Secondary | ICD-10-CM | POA: Insufficient documentation

## 2017-08-13 ENCOUNTER — Ambulatory Visit: Payer: Self-pay | Admitting: Physician Assistant

## 2017-08-15 ENCOUNTER — Ambulatory Visit (INDEPENDENT_AMBULATORY_CARE_PROVIDER_SITE_OTHER): Payer: Self-pay | Admitting: Orthopaedic Surgery

## 2017-08-15 ENCOUNTER — Ambulatory Visit (INDEPENDENT_AMBULATORY_CARE_PROVIDER_SITE_OTHER): Payer: Self-pay | Admitting: Urology

## 2017-08-15 DIAGNOSIS — M549 Dorsalgia, unspecified: Secondary | ICD-10-CM

## 2017-08-15 DIAGNOSIS — N200 Calculus of kidney: Secondary | ICD-10-CM

## 2017-08-16 ENCOUNTER — Ambulatory Visit (INDEPENDENT_AMBULATORY_CARE_PROVIDER_SITE_OTHER): Payer: Self-pay | Admitting: Orthopaedic Surgery

## 2017-08-16 DIAGNOSIS — G8929 Other chronic pain: Secondary | ICD-10-CM

## 2017-08-16 DIAGNOSIS — M25561 Pain in right knee: Secondary | ICD-10-CM

## 2017-08-16 DIAGNOSIS — M25562 Pain in left knee: Secondary | ICD-10-CM

## 2017-08-16 HISTORY — DX: Other chronic pain: G89.29

## 2017-08-16 MED ORDER — LIDOCAINE HCL 1 % IJ SOLN
3.0000 mL | INTRAMUSCULAR | Status: AC | PRN
  Administered 2017-08-16: 3 mL

## 2017-08-16 MED ORDER — METHYLPREDNISOLONE ACETATE 40 MG/ML IJ SUSP
40.0000 mg | INTRAMUSCULAR | Status: AC | PRN
  Administered 2017-08-16: 40 mg via INTRA_ARTICULAR

## 2017-08-16 NOTE — Progress Notes (Signed)
Office Visit Note   Patient: Michaela Aguilar           Date of Birth: February 21, 1966           MRN: [id number] Visit Date: 08/16/2017              Requested by: Soyla Dryer, PA-C [address] [place], [place] [address] PCP: Soyla Dryer, PA-C   Assessment & Plan: Visit Diagnoses:  1. Chronic pain of left knee   2. Chronic pain of right knee     Plan: I did talk about trying steroid injections in her knees again and she agreed to this. I think in the long-term the only recommendation I would have her left knee is a possible arthroscopic intervention to assess for a true osteochondral defect if she does develop mechanical symptoms. This would be such as locking and catching. He would not perform an arthroscopic intervention just to treat pain. I do feel that quad strengthening exercises and weight loss would help decrease her knee pain. She could continue to have long-term problems as a relates her knees just compensating for mobility with her back. She'll follow-up as needed.  Follow-Up Instructions: Return in about 4 weeks (around 09/13/2017).   Orders:  No orders of the defined types were placed in this encounter.  No orders of the defined types were placed in this encounter.     Procedures: Large Joint Inj Date/Time: 08/16/2017 3:28 PM Performed by: Mcarthur Rossetti Authorized by: Jean Rosenthal Y   Location:  Knee Site:  R knee Ultrasound Guidance: No   Fluoroscopic Guidance: No   Arthrogram: No   Medications:  3 mL lidocaine 1 %; 40 mg methylPREDNISolone acetate 40 MG/ML Large Joint Inj Date/Time: 08/16/2017 3:28 PM Performed by: Mcarthur Rossetti Authorized by: Mcarthur Rossetti   Location:  Knee Site:  L knee Ultrasound Guidance: No   Fluoroscopic Guidance: No   Arthrogram: No   Medications:  3 mL lidocaine 1 %; 40 mg methylPREDNISolone acetate 40 MG/ML     Clinical Data: No additional findings.   Subjective: No chief  complaint on file. Patient is someone with chronic bilateral knee pain but this pain did not occur until after a fall that she had injuring her back. After that she developed pain going down her left leg and developed left knee pain that she had not had prior to this fall. An MRI of her left knee in March 2018 showed a questionable osteochondral defect and a one-time we did recommend arthroscopic intervention. She's been interested in still trying a steroid injection, times it'll calm things down. She would like to have steroid injections in both her knees today. I do feel that her back injury may have contributed to the knee issue on the left side given the MRI findings of the osteochondral defect which usually occurs in a traumatic situation. The remainder of her cartilage is intact on that MRI. The way she walks and favors her back and likely also contributed to pain in her knees.  HPI  Review of Systems Other than back pain and knee pain she denies any chest pain, shortness of breath, nausea, headache, fever chills, vomiting.  Objective: Vital Signs: LMP 02/16/2014   Physical Exam She is alert and oriented 3 and in no acute distress. She is ambulating with a cane. Ortho Exam Today neither knee shows effusion and the patellas track well with both knees have global tenderness. Both knees are ligamentously stable. Specialty  Comments:  No specialty comments available.  Imaging: No results found. I again reviewed the MRI of her left knee that was done earlier this year.  MRI showed a grade 2 sprain of the MCL which is likely healed by now. The only finding was a 5 mm free osteochondral fragment anterior the anterior horn of the lateral meniscus but it is of questionable significance. Certainly this can cause some mechanical symptoms and swelling.  PMFS History: Patient Active Problem List   Diagnosis Date Noted  . Chronic pain of right knee 08/16/2017  . Chronic pain of left knee 04/03/2017   . Diabetes mellitus without complication ([place]) 56/81/2751  . Class 2 obesity with body mass index (BMI) of 38.0 to 38.9 in adult 10/26/2016  . Neck pain 11/17/2015  . Low back pain 11/17/2015   Past Medical History:  Diagnosis Date  . Anxiety   . Bronchitis   . Depression   . Gestational diabetes    1992  . History of kidney stones   . Kidney stones per pt.    Family History  Problem Relation Age of Onset  . Osteoporosis Mother   . Depression Father   . Mental illness Father   . Cancer Maternal Uncle        lung cancer  . COPD Maternal Uncle   . Depression Maternal Uncle   . Emphysema Maternal Grandmother   . Cancer Maternal Grandfather        lung cancer    Past Surgical History:  Procedure Laterality Date  . CESAREAN SECTION     x2  . CYSTOSCOPY WITH STENT PLACEMENT Right 09/06/2016   Procedure: CYSTOSCOPY WITH RIGHT URETERAL STENT PLACEMENT;  Surgeon: Cleon Gustin, MD;  Location: [facility];  Service: Urology;  Laterality: Right;  . CYSTOSCOPY/RETROGRADE/URETEROSCOPY/STONE EXTRACTION WITH BASKET Right 09/06/2016   Procedure: CYSTOSCOPY/RIGHT RETROGRADE PYELOGRAM, RIGHT RENAL STONE EXTRACTION WITH LASER;  Surgeon: Cleon Gustin, MD;  Location: [facility];  Service: Urology;  Laterality: Right;  . [place] 2010  . HERNIA REPAIR     incisional hernia   Social History   Occupational History  . Not on file.   Social History Main Topics  . Smoking status: Former Smoker    Packs/day: 0.50    Years: 15.00    Types: Cigarettes, Cigars    Quit date: 2012  . Smokeless tobacco: Never Used     Comment: quit smoking cigarretes 2012. pt states she smokes 1 black and mild every 3 days  . Alcohol use No  . Drug use: No     Comment: last marijuana use early October 2017  . Sexual activity: Yes    Birth control/ protection: None, Post-menopausal

## 2017-08-21 ENCOUNTER — Ambulatory Visit (INDEPENDENT_AMBULATORY_CARE_PROVIDER_SITE_OTHER): Payer: Self-pay | Admitting: Sports Medicine

## 2017-08-21 VITALS — BP 140/84 | Ht 65.0 in | Wt 224.0 lb

## 2017-08-21 DIAGNOSIS — M5442 Lumbago with sciatica, left side: Secondary | ICD-10-CM

## 2017-08-21 DIAGNOSIS — M25562 Pain in left knee: Secondary | ICD-10-CM

## 2017-08-21 DIAGNOSIS — G8929 Other chronic pain: Secondary | ICD-10-CM

## 2017-08-22 NOTE — Progress Notes (Signed)
   Subjective:    Patient ID: Michaela Aguilar, female    DOB: 04-06-1966, 51 y.o.   MRN: [id number]  HPI   Patient comes in today to discuss chronic low back pain, sciatica, and left knee pain. Symptoms all began after a fall in 2017. Low back pain and sciatica have been treated in the past with epidural steroid injections with good results. For her left knee pain, an MRI was ordered which showed a small loose body for which a referral to Dr. Ninfa Linden was made. Dr. Ninfa Linden has treated the patient conservatively up to this point but has not completely ruled out surgery. Patient is here primarily to discuss next steps in her treatment.  Review of Systems    as above Objective:   Physical Exam  Obese. No acute distress  Left knee: She has good range of motion and no effusion. Global tenderness to palpation but knees are ligamentously stable. She does have some quad atrophy and walks with a limp and a cane.  Neurological exam shows reflexes to be equal at the Achilles and patellar tendons bilaterally. Strength is intact bilaterally as well. Sensation is intact to light touch grossly.      Assessment & Plan:   Chronic low back pain with sciatica Chronic left knee pain  Patient's symptoms all started after a fall in 2017. I was able to review Dr. Trevor Mace notes and I agree with him that her back injury may have contributed to her left knee pain, and I will continue to defer definitive treatment in regards to her knee to Dr. Ninfa Linden. Regarding her low back pain and sciatica, she has responded well to epidural steroids in the past but she would like to hold on those for now. She did see Dr. Lorin Mercy previously who recommended against surgery. Follow-up with me as needed.

## 2017-09-17 ENCOUNTER — Telehealth (INDEPENDENT_AMBULATORY_CARE_PROVIDER_SITE_OTHER): Payer: Self-pay | Admitting: Orthopaedic Surgery

## 2017-09-17 ENCOUNTER — Encounter (INDEPENDENT_AMBULATORY_CARE_PROVIDER_SITE_OTHER): Payer: Self-pay | Admitting: Orthopaedic Surgery

## 2017-09-17 ENCOUNTER — Ambulatory Visit (INDEPENDENT_AMBULATORY_CARE_PROVIDER_SITE_OTHER): Payer: Self-pay | Admitting: Orthopaedic Surgery

## 2017-09-17 DIAGNOSIS — M25562 Pain in left knee: Secondary | ICD-10-CM

## 2017-09-17 DIAGNOSIS — G8929 Other chronic pain: Secondary | ICD-10-CM

## 2017-09-17 DIAGNOSIS — M25561 Pain in right knee: Secondary | ICD-10-CM

## 2017-09-17 NOTE — Telephone Encounter (Signed)
Patient inquired if we've received request for records from [facility]. I told patient that we have not. She stated she was going to have them fax today, their request and call me back in couple of weeks to follow up.

## 2017-09-17 NOTE — Progress Notes (Signed)
The patient is continue follow-up of bilateral chronic knee pain.  She said the injections that I provided last month helped significantly but she is not sure if this weather changes or not but her knees have been swelling.  She still denies any mechanical symptoms at all.  On examination of her knees she has excellent range of motion of both knees.  She is walking with a slight limp.  There is no effusion this morning.  There is negative McMurray's negative Lockman's exam.  Other than global pain her knee exam is normal.  At this standpoint we will going to continue to just follow her closely and have the recommendations of the quad strengthening exercises and anti-inflammatories.  We will see her back in 2 months and consider repeat steroid injections or considering arthroscopic intervention her most painful knee if needed.

## 2017-09-18 ENCOUNTER — Other Ambulatory Visit (HOSPITAL_COMMUNITY)
Admission: RE | Admit: 2017-09-18 | Discharge: 2017-09-18 | Disposition: A | Discharge status: Home / Self Care | Payer: Self-pay | Source: Ambulatory Visit | Attending: Physician Assistant | Admitting: Physician Assistant

## 2017-09-18 DIAGNOSIS — E119 Type 2 diabetes mellitus without complications: Secondary | ICD-10-CM | POA: Insufficient documentation

## 2017-09-18 LAB — LIPID PANEL
Cholesterol: 191 mg/dL (ref 0–200)
HDL: 55 mg/dL (ref >40)
LDL CALC: 110 mg/dL — AB (ref 0–99)
Total CHOL/HDL Ratio: 3.5 RATIO
Triglycerides: 132 mg/dL (ref <150)
VLDL: 26 mg/dL (ref 0–40)

## 2017-09-18 LAB — COMPREHENSIVE METABOLIC PANEL
ALK PHOS: 86 U/L (ref 38–126)
ALT: 23 U/L (ref 14–54)
ANION GAP: 10 (ref 5–15)
AST: 19 U/L (ref 15–41)
Albumin: 3.9 g/dL (ref 3.5–5.0)
BILIRUBIN TOTAL: 0.7 mg/dL (ref 0.3–1.2)
BUN: 12 mg/dL (ref 6–20)
CALCIUM: 9.5 mg/dL (ref 8.9–10.3)
CO2: 26 mmol/L (ref 22–32)
CREATININE: 0.54 mg/dL (ref 0.44–1.00)
Chloride: 104 mmol/L (ref 101–111)
GFR calc non Af Amer: >60 mL/min (ref >60)
GLUCOSE: 122 mg/dL — AB (ref 65–99)
Potassium: 3.9 mmol/L (ref 3.5–5.1)
SODIUM: 140 mmol/L (ref 135–145)
TOTAL PROTEIN: 7.1 g/dL (ref 6.5–8.1)

## 2017-09-18 LAB — HEMOGLOBIN A1C
Hgb A1c MFr Bld: 6.7 % — ABNORMAL HIGH (ref 4.8–5.6)
Mean Plasma Glucose: 145.59 mg/dL

## 2017-09-20 ENCOUNTER — Ambulatory Visit: Payer: Self-pay | Admitting: Physician Assistant

## 2017-09-20 ENCOUNTER — Encounter: Payer: Self-pay | Admitting: Physician Assistant

## 2017-09-20 VITALS — BP 130/76 | HR 68 | Temp 97.7°F | Ht 65.0 in | Wt 237.0 lb

## 2017-09-20 DIAGNOSIS — G8929 Other chronic pain: Secondary | ICD-10-CM

## 2017-09-20 DIAGNOSIS — E669 Obesity, unspecified: Secondary | ICD-10-CM

## 2017-09-20 DIAGNOSIS — Z1239 Encounter for other screening for malignant neoplasm of breast: Secondary | ICD-10-CM

## 2017-09-20 DIAGNOSIS — E119 Type 2 diabetes mellitus without complications: Secondary | ICD-10-CM

## 2017-09-20 DIAGNOSIS — F39 Unspecified mood [affective] disorder: Secondary | ICD-10-CM

## 2017-09-20 NOTE — Progress Notes (Signed)
BP 130/76 (BP Location: Left Arm, Patient Position: Sitting, Cuff Size: Normal)   Pulse 68   Temp 97.7 F (36.5 C)   Ht 5\' 5"  (1.651 m)   Wt 237 lb (107.5 kg)   LMP 02/16/2014   SpO2 98%   BMI 39.44 kg/m    Subjective:    Patient ID: Michaela Aguilar, female    DOB: Apr 17, 1966, 51 y.o.   MRN: [id number]  HPI: Michaela Aguilar is a 51 y.o. female presenting on 09/20/2017 for Diabetes   HPI   [facility] [place] [facility]  Pt mostly concerned about her cataracts  Pt didn't return her iFOBT for colon cancer screening  Pt is not taking metformin but about twice/week or so because it makes her nauseous.   Relevant past medical, surgical, family and social history reviewed and updated as indicated. Interim medical history since our last visit reviewed. Allergies and medications reviewed and updated.   Current Outpatient Medications:  .  amitriptyline (ELAVIL) 25 MG tablet, Take 25 mg by mouth at bedtime., Disp: , Rfl:  .  citalopram (CELEXA) 40 MG tablet, Take 40 mg by mouth daily., Disp: , Rfl:  .  DiazePAM (VALIUM PO), Take 0.5 mg 3 (three) times daily as needed by mouth., Disp: , Rfl:  .  diclofenac (VOLTAREN) 75 MG EC tablet, Take 1 tablet (75 mg total) by mouth 2 (two) times daily as needed. Take with food, Disp: 60 tablet, Rfl: 1 .  gabapentin (NEURONTIN) 300 MG capsule, Take 1 capsule (300 mg total) by mouth at bedtime as needed., Disp: 60 capsule, Rfl: 1 .  glipiZIDE (GLUCOTROL) 5 MG tablet, Take 1 tablet (5 mg total) by mouth daily before breakfast., Disp: 30 tablet, Rfl: 3 .  metFORMIN (GLUCOPHAGE XR) 500 MG 24 hr tablet, Take 1 tablet (500 mg total) by mouth daily with breakfast., Disp: 30 tablet, Rfl: 4 .  Multiple Vitamins-Minerals (MULTIVITAMIN WITH MINERALS) tablet, Take 1 tablet by mouth daily., Disp: , Rfl:  .  ALPRAZolam (XANAX) 1 MG tablet, Take 1 mg by mouth every 6 (six) hours as needed for anxiety. For pain, Disp: , Rfl:    Review of Systems   Constitutional: Positive for diaphoresis and fatigue. Negative for appetite change, chills, fever and unexpected weight change.  HENT: Negative for congestion, drooling, ear pain, facial swelling, hearing loss, mouth sores, sneezing, sore throat, trouble swallowing and voice change.   Eyes: Positive for redness and visual disturbance. Negative for pain, discharge and itching.  Respiratory: Negative for cough, choking, shortness of breath and wheezing.   Cardiovascular: Positive for leg swelling. Negative for chest pain and palpitations.  Gastrointestinal: Negative for abdominal pain, blood in stool, constipation, diarrhea and vomiting.  Endocrine: Negative for cold intolerance, heat intolerance and polydipsia.  Genitourinary: Negative for decreased urine volume, dysuria and hematuria.  Musculoskeletal: Positive for arthralgias, back pain and gait problem.  Skin: Negative for rash.  Allergic/Immunologic: Negative for environmental allergies.  Neurological: Positive for light-headedness and headaches. Negative for seizures and syncope.  Hematological: Negative for adenopathy.  Psychiatric/Behavioral: Positive for dysphoric mood. Negative for agitation and suicidal ideas. The patient is nervous/anxious.     Per HPI unless specifically indicated above     Objective:    BP 130/76 (BP Location: Left Arm, Patient Position: Sitting, Cuff Size: Normal)   Pulse 68   Temp 97.7 F (36.5 C)   Ht 5\' 5"  (1.651 m)   Wt 237 lb (107.5 kg)  LMP 02/16/2014   SpO2 98%   BMI 39.44 kg/m   Wt Readings from Last 3 Encounters:  09/20/17 237 lb (107.5 kg)  08/21/17 224 lb (101.6 kg)  06/20/17 230 lb (104.3 kg)    Physical Exam  Constitutional: She is oriented to person, place, and time. She appears well-developed and well-nourished.  HENT:  Head: Normocephalic and atraumatic.  Neck: Neck supple.  Cardiovascular: Normal rate and regular rhythm.  Pulmonary/Chest: Effort normal and breath sounds  normal.  Abdominal: Soft. Bowel sounds are normal. She exhibits no mass. There is no hepatosplenomegaly. There is no tenderness.  Musculoskeletal: She exhibits no edema.  Lymphadenopathy:    She has no cervical adenopathy.  Neurological: She is alert and oriented to person, place, and time.  Skin: Skin is warm and dry.  Psychiatric: She has a normal mood and affect. Her behavior is normal.  Vitals reviewed.   Results for orders placed or performed during the hospital encounter of 09/18/17  Comprehensive Metabolic Panel (CMET)  Result Value Ref Range   Sodium 140 135 - 145 mmol/L   Potassium 3.9 3.5 - 5.1 mmol/L   Chloride 104 101 - 111 mmol/L   CO2 26 22 - 32 mmol/L   Glucose, Bld 122 (H) 65 - 99 mg/dL   BUN 12 6 - 20 mg/dL   Creatinine, Ser 0.54 0.44 - 1.00 mg/dL   Calcium 9.5 8.9 - 10.3 mg/dL   Total Protein 7.1 6.5 - 8.1 g/dL   Albumin 3.9 3.5 - 5.0 g/dL   AST 19 15 - 41 U/L   ALT 23 14 - 54 U/L   Alkaline Phosphatase 86 38 - 126 U/L   Total Bilirubin 0.7 0.3 - 1.2 mg/dL   GFR calc non Af Amer >60 >60 mL/min   GFR calc Af Amer >60 >60 mL/min   Anion gap 10 5 - 15  Lipid Profile  Result Value Ref Range   Cholesterol 191 0 - 200 mg/dL   Triglycerides 132 <150 mg/dL   HDL 55 >40 mg/dL   Total CHOL/HDL Ratio 3.5 RATIO   VLDL 26 0 - 40 mg/dL   LDL Cholesterol 110 (H) 0 - 99 mg/dL  HgB A1c  Result Value Ref Range   Hgb A1c MFr Bld 6.7 (H) 4.8 - 5.6 %   Mean Plasma Glucose 145.59 mg/dL      Assessment & Plan:   Encounter Diagnoses  Name Primary?  . Controlled type 2 diabetes mellitus without complication, without long-term current use of insulin ([place]) Yes  . Mood disorder (Michaela Aguilar)   . Other chronic pain   . Obesity, unspecified classification, unspecified obesity type, unspecified whether serious comorbidity present   . Screening for breast cancer     -reviewed labs with pt -Discussed stopping metformin. Pt wants to try taking it with her evening meal -Pt is to  return iFOBT for colon cancer screening -screening mammogram was ordered -discussed with pt that there are no funds for cataract surgery.  Recommended she discuss payment options with eye doctor -pt will follow up 3 months.  RTO sooner prn

## 2017-09-26 ENCOUNTER — Ambulatory Visit (INDEPENDENT_AMBULATORY_CARE_PROVIDER_SITE_OTHER): Payer: Self-pay | Admitting: Urology

## 2017-09-26 DIAGNOSIS — M549 Dorsalgia, unspecified: Secondary | ICD-10-CM

## 2017-09-26 DIAGNOSIS — N200 Calculus of kidney: Secondary | ICD-10-CM

## 2017-10-03 ENCOUNTER — Other Ambulatory Visit: Payer: Self-pay | Admitting: Obstetrics and Gynecology

## 2017-10-03 DIAGNOSIS — Z1231 Encounter for screening mammogram for malignant neoplasm of breast: Secondary | ICD-10-CM

## 2017-11-01 ENCOUNTER — Ambulatory Visit
Admission: RE | Admit: 2017-11-01 | Discharge: 2017-11-01 | Disposition: A | Discharge status: Home / Self Care | Payer: No Typology Code available for payment source | Source: Ambulatory Visit | Attending: Obstetrics and Gynecology | Admitting: Obstetrics and Gynecology

## 2017-11-01 ENCOUNTER — Ambulatory Visit (HOSPITAL_COMMUNITY)
Admission: RE | Admit: 2017-11-01 | Discharge: 2017-11-01 | Disposition: A | Discharge status: Home / Self Care | Payer: Self-pay | Source: Ambulatory Visit | Attending: Obstetrics and Gynecology | Admitting: Obstetrics and Gynecology

## 2017-11-01 ENCOUNTER — Encounter (HOSPITAL_COMMUNITY): Payer: Self-pay

## 2017-11-01 VITALS — BP 110/70 | Temp 98.6°F | Ht 65.0 in | Wt 240.8 lb

## 2017-11-01 DIAGNOSIS — Z1239 Encounter for other screening for malignant neoplasm of breast: Secondary | ICD-10-CM

## 2017-11-01 DIAGNOSIS — Z1231 Encounter for screening mammogram for malignant neoplasm of breast: Secondary | ICD-10-CM

## 2017-11-01 NOTE — Patient Instructions (Signed)
Explained breast self awareness with Romie Jumper. Patient did not need a Pap smear today due to last Pap smear was 10/26/2016. Let her know [facility]CP will cover Pap smears every 3 years unless has a history of abnormal Pap smears. Referred patient to the [facility] for a screening mammogram. Appointment scheduled for Thursday, November 01, 2017 at 0900. Let patient know the [facility] will follow up with her within the next couple weeks with results of mammogram by letter or phone. Discussed smoking cessation with patient. Referred to the [facility] Quitline and gave resources to free smoking cessation classes at [facility]. Romie Jumper verbalized understanding.  Calianna Kim, Arvil [place], RN 8:18 AM

## 2017-11-01 NOTE — Progress Notes (Signed)
No complaints today.   Pap Smear:  Pap smear not completed today. Last Pap smear was at [facility], PA-C's office and normal. Per patient has no history of an abnormal Pap smear. Last Pap smear result is in Epic.  Physical exam: Breasts Breasts symmetrical. No skin abnormalities bilateral breasts. No nipple retraction bilateral breasts. No nipple discharge bilateral breasts. No lymphadenopathy. No lumps palpated bilateral breasts. No complaints of pain or tenderness on exam. Referred patient to the [facility] for a screening mammogram. Appointment scheduled for Thursday, November 01, 2017 at 0900.        Pelvic/Bimanual No Pap smear completed today since last Pap smear was 10/26/2016. Pap smear not indicated per BCCCP guidelines.   Smoking History: Patient is a current smoker. Discussed smoking cessation with patient. Referred to the [facility] Quitline and gave resources to free smoking cessation classes at [facility].  Patient Navigation: Patient education provided. Access to services provided for patient through [place] program.   Colorectal Cancer Screening: Per patient has never had a colonoscopy completed. No complaints today. FIT Test given to patient to complete at her PCP office.

## 2017-11-02 ENCOUNTER — Encounter (HOSPITAL_COMMUNITY): Payer: Self-pay | Admitting: *Deleted

## 2017-11-02 ENCOUNTER — Other Ambulatory Visit (HOSPITAL_COMMUNITY): Payer: Self-pay

## 2017-11-19 ENCOUNTER — Encounter (INDEPENDENT_AMBULATORY_CARE_PROVIDER_SITE_OTHER): Payer: Self-pay | Admitting: Orthopaedic Surgery

## 2017-11-19 ENCOUNTER — Ambulatory Visit (INDEPENDENT_AMBULATORY_CARE_PROVIDER_SITE_OTHER): Payer: Self-pay | Admitting: Orthopaedic Surgery

## 2017-11-19 DIAGNOSIS — M25562 Pain in left knee: Secondary | ICD-10-CM

## 2017-11-19 DIAGNOSIS — M25561 Pain in right knee: Secondary | ICD-10-CM

## 2017-11-19 DIAGNOSIS — G8929 Other chronic pain: Secondary | ICD-10-CM

## 2017-11-19 MED ORDER — METHYLPREDNISOLONE ACETATE 40 MG/ML IJ SUSP
40.0000 mg | INTRAMUSCULAR | Status: AC | PRN
  Administered 2017-11-19: 40 mg via INTRA_ARTICULAR

## 2017-11-19 MED ORDER — LIDOCAINE HCL 1 % IJ SOLN
3.0000 mL | INTRAMUSCULAR | Status: AC | PRN
  Administered 2017-11-19: 3 mL

## 2017-11-19 NOTE — Progress Notes (Signed)
Office Visit Note   Patient: Michaela Aguilar           Date of Birth: Dec 11, 1965           MRN: [id number] Visit Date: 11/19/2017              Requested by: Soyla Dryer, PA-C [address] [place], [place] [address] PCP: Soyla Dryer, PA-C   Assessment & Plan: Visit Diagnoses:  1. Chronic pain of left knee   2. Chronic pain of right knee     Plan: Certainly dealing with chronic pain is difficult.  We could always perform an arthroscopic intervention on the left knee but I am not sure if we did find anything based on the MRI findings.  Steroid injections to help in the past I recommend an injection in both knees today.  She will follow-up as needed.  Follow-Up Instructions: Return if symptoms worsen or fail to improve.   Orders:  Orders Placed This Encounter  Procedures  . Large Joint Inj  . Large Joint Inj  . Nail Removal   No orders of the defined types were placed in this encounter.     Procedures: Large Joint Inj: R knee on 11/19/2017 9:42 AM Indications: diagnostic evaluation and pain Details: 22 G 1.5 in needle, superolateral approach  Arthrogram: No  Medications: 3 mL lidocaine 1 %; 40 mg methylPREDNISolone acetate 40 MG/ML Outcome: tolerated well, no immediate complications Procedure, treatment alternatives, risks and benefits explained, specific risks discussed. Consent was given by the patient. Immediately prior to procedure a time out was called to verify the correct patient, procedure, equipment, support staff and site/side marked as required. Patient was prepped and draped in the usual sterile fashion.   Large Joint Inj: L knee on 11/19/2017 9:43 AM Indications: diagnostic evaluation and pain Details: 22 G 1.5 in needle, superolateral approach  Arthrogram: No  Medications: 3 mL lidocaine 1 %; 40 mg methylPREDNISolone acetate 40 MG/ML Outcome: tolerated well, no immediate complications Procedure, treatment alternatives, risks and benefits explained,  specific risks discussed. Consent was given by the patient. Immediately prior to procedure a time out was called to verify the correct patient, procedure, equipment, support staff and site/side marked as required. Patient was prepped and draped in the usual sterile fashion.       Clinical Data: No additional findings.   Subjective: Chief Complaint  Patient presents with  . Left Knee - Follow-up  . Right Knee - Follow-up  The patient comes in today with continued chronic bilateral knee pain.  She had an MRI of her left knee in March 2019 that showed no meniscal tear with a sprain of the medial collateral ligament.  There was some mild degenerative changes in her knee and only mild effusion with a questionable osteochondral fragment.  Both knees have hurt significantly since a mechanical fall in July 2016 that occurred at the [employer].  She said she was taken to the ER after that.  She is never recovered from the pain she has had in both knees.  She said they swell quite a bit.  She currently denies being a diabetic.  She is alert and oriented and in no acute distress  HPI  Review of Systems She currently denies any chest pain, shortness of breath, fever, chills, nausea, vomiting.  Objective: Vital Signs: LMP 02/16/2014   Physical Exam  Ortho Exam Examination of both knees today show no effusion.  Both knees her ligaments are stable with a  negative McMurray's negative Lockman's exam.  Both knees have full range of motion.  Both knees are globally tender. Specialty Comments:  No specialty comments available.  Imaging: No results found.   PMFS History: Patient Active Problem List   Diagnosis Date Noted  . Chronic pain of right knee 08/16/2017  . Chronic pain of left knee 04/03/2017  . Diabetes mellitus without complication ([place]) 40/98/1191  . Class 2 obesity with body mass index (BMI) of 38.0 to 38.9 in adult 10/26/2016  . Neck pain 11/17/2015  . Low back pain 11/17/2015     Past Medical History:  Diagnosis Date  . Anxiety   . Bronchitis   . Depression   . Gestational diabetes    1992  . History of kidney stones   . Kidney stones per pt.    Family History  Problem Relation Age of Onset  . Osteoporosis Mother   . Depression Father   . Mental illness Father   . Cancer Maternal Uncle        lung cancer  . COPD Maternal Uncle   . Depression Maternal Uncle   . Emphysema Maternal Grandmother   . Cancer Maternal Grandfather        lung cancer  . Breast cancer Neg Hx     Past Surgical History:  Procedure Laterality Date  . CESAREAN SECTION     x2  . CYSTOSCOPY WITH STENT PLACEMENT Right 09/06/2016   Procedure: CYSTOSCOPY WITH RIGHT URETERAL STENT PLACEMENT;  Surgeon: Cleon Gustin, MD;  Location: [facility];  Service: Urology;  Laterality: Right;  . CYSTOSCOPY/RETROGRADE/URETEROSCOPY/STONE EXTRACTION WITH BASKET Right 09/06/2016   Procedure: CYSTOSCOPY/RIGHT RETROGRADE PYELOGRAM, RIGHT RENAL STONE EXTRACTION WITH LASER;  Surgeon: Cleon Gustin, MD;  Location: [facility];  Service: Urology;  Laterality: Right;  . [place] 2010  . HERNIA REPAIR     incisional hernia   Social History   Occupational History  . Not on file  Tobacco Use  . Smoking status: Current Every Day Smoker    Packs/day: 0.50    Years: 15.00    Pack years: 7.50    Types: Cigarettes, Cigars    Last attempt to quit: 2012    Years since quitting: 7.0  . Smokeless tobacco: Never Used  . Tobacco comment: quit smoking cigarretes 2012. pt states she smokes 1 black and mild every 3 days  Substance and Sexual Activity  . Alcohol use: No  . Drug use: No    Comment: last marijuana use early October 2017  . Sexual activity: Yes    Birth control/protection: None, Post-menopausal

## 2017-12-07 ENCOUNTER — Other Ambulatory Visit (HOSPITAL_COMMUNITY)
Admission: RE | Admit: 2017-12-07 | Discharge: 2017-12-07 | Disposition: A | Discharge status: Home / Self Care | Payer: No Typology Code available for payment source | Source: Ambulatory Visit | Attending: Physician Assistant | Admitting: Physician Assistant

## 2017-12-07 DIAGNOSIS — E119 Type 2 diabetes mellitus without complications: Secondary | ICD-10-CM

## 2017-12-07 LAB — HEMOGLOBIN A1C
HEMOGLOBIN A1C: 7.4 % — AB (ref 4.8–5.6)
MEAN PLASMA GLUCOSE: 165.68 mg/dL

## 2017-12-20 ENCOUNTER — Ambulatory Visit: Payer: Self-pay | Admitting: Physician Assistant

## 2017-12-20 ENCOUNTER — Encounter: Payer: Self-pay | Admitting: Physician Assistant

## 2017-12-20 VITALS — BP 114/70 | HR 86 | Temp 97.9°F | Ht 65.0 in | Wt 241.8 lb

## 2017-12-20 DIAGNOSIS — E1165 Type 2 diabetes mellitus with hyperglycemia: Secondary | ICD-10-CM

## 2017-12-20 DIAGNOSIS — F39 Unspecified mood [affective] disorder: Secondary | ICD-10-CM

## 2017-12-20 MED ORDER — GLIPIZIDE 5 MG PO TABS: 5.0000 mg | ORAL_TABLET | Freq: Two times a day (BID) | ORAL | 3 refills | Status: DC

## 2017-12-20 NOTE — Progress Notes (Signed)
BP 114/70 (BP Location: Left Arm, Patient Position: Sitting, Cuff Size: Normal)   Pulse 86   Temp 97.9 F (36.6 C)   Ht 5\' 5"  (1.651 m)   Wt 241 lb 12 oz (109.7 kg)   LMP 02/16/2014   SpO2 95%   BMI 40.23 kg/m    Subjective:    Patient ID: Michaela Aguilar, female    DOB: 10/14/1966, 52 y.o.   MRN: [id number]  HPI: Michaela Aguilar is a 52 y.o. female presenting on 12/20/2017 for Diabetes and Medication Problem (pt states she feels palpatations now that she is taking valium. pt states she sees Dr. Hoyle Barr.)   HPI   Pt stopped taking her metformin altogether because it was making her nauseous. She is taking her glipizide.   Pt is angry because dr Hoyle Barr  stopped her xanax and started her on valium.  She says she is having lots of anxiety now. She says it is like taking a placebo.   Relevant past medical, surgical, family and social history reviewed and updated as indicated. Interim medical history since our last visit reviewed. Allergies and medications reviewed and updated.   Current Outpatient Medications:  .  amitriptyline (ELAVIL) 25 MG tablet, Take 25 mg by mouth at bedtime., Disp: , Rfl:  .  citalopram (CELEXA) 40 MG tablet, Take 40 mg by mouth daily., Disp: , Rfl:  .  DiazePAM (VALIUM PO), Take 0.5 mg by mouth 2 (two) times daily. , Disp: , Rfl:  .  diclofenac (VOLTAREN) 75 MG EC tablet, Take 1 tablet (75 mg total) by mouth 2 (two) times daily as needed. Take with food, Disp: 60 tablet, Rfl: 1 .  glipiZIDE (GLUCOTROL) 5 MG tablet, Take 1 tablet (5 mg total) by mouth daily before breakfast., Disp: 30 tablet, Rfl: 3 .  Multiple Vitamins-Minerals (MULTIVITAMIN WITH MINERALS) tablet, Take 1 tablet by mouth daily., Disp: , Rfl:  .  gabapentin (NEURONTIN) 300 MG capsule, Take 1 capsule (300 mg total) by mouth at bedtime as needed. (Patient not taking: Reported on 12/20/2017), Disp: 60 capsule, Rfl: 1 .  metFORMIN (GLUCOPHAGE XR) 500 MG 24 hr tablet, Take 1 tablet (500 mg total) by mouth daily  with breakfast. (Patient not taking: Reported on 11/01/2017), Disp: 30 tablet, Rfl: 4   Review of Systems  Constitutional: Negative for appetite change, chills, diaphoresis, fatigue, fever and unexpected weight change.  HENT: Negative for congestion, dental problem, drooling, ear pain, facial swelling, hearing loss, mouth sores, sneezing, sore throat, trouble swallowing and voice change.   Eyes: Positive for visual disturbance. Negative for pain, discharge, redness and itching.  Respiratory: Negative for cough, choking, shortness of breath and wheezing.   Cardiovascular: Positive for palpitations and leg swelling. Negative for chest pain.  Gastrointestinal: Negative for abdominal pain, blood in stool, constipation, diarrhea and vomiting.  Endocrine: Negative for cold intolerance, heat intolerance and polydipsia.  Genitourinary: Negative for decreased urine volume, dysuria and hematuria.  Musculoskeletal: Positive for arthralgias, back pain and gait problem.  Skin: Negative for rash.  Allergic/Immunologic: Negative for environmental allergies.  Neurological: Negative for seizures, syncope, light-headedness and headaches.  Hematological: Negative for adenopathy.  Psychiatric/Behavioral: Negative for agitation, dysphoric mood and suicidal ideas. The patient is nervous/anxious.     Per HPI unless specifically indicated above     Objective:    BP 114/70 (BP Location: Left Arm, Patient Position: Sitting, Cuff Size: Normal)   Pulse 86   Temp 97.9 F (36.6 C)   Ht 5'  5" (1.651 m)   Wt 241 lb 12 oz (109.7 kg)   LMP 02/16/2014   SpO2 95%   BMI 40.23 kg/m   Wt Readings from Last 3 Encounters:  12/20/17 241 lb 12 oz (109.7 kg)  11/01/17 240 lb 12.8 oz (109.2 kg)  09/20/17 237 lb (107.5 kg)    Physical Exam  Constitutional: She is oriented to person, place, and time. She appears well-developed and well-nourished.  HENT:  Head: Normocephalic and atraumatic.  Neck: Neck supple.   Cardiovascular: Normal rate and regular rhythm.  Pulmonary/Chest: Effort normal and breath sounds normal.  Abdominal: Soft. Bowel sounds are normal. She exhibits no mass. There is no hepatosplenomegaly. There is no tenderness.  Musculoskeletal: She exhibits no edema.  Lymphadenopathy:    She has no cervical adenopathy.  Neurological: She is alert and oriented to person, place, and time.  Skin: Skin is warm and dry.  Psychiatric: Her behavior is normal. Her mood appears anxious. Her speech is rapid and/or pressured.  Vitals reviewed.   Results for orders placed or performed during the hospital encounter of 12/07/17  HgB A1c  Result Value Ref Range   Hgb A1c MFr Bld 7.4 (H) 4.8 - 5.6 %   Mean Plasma Glucose 165.68 mg/dL      Assessment & Plan:    Encounter Diagnoses  Name Primary?  Marland Kitchen Uncontrolled type 2 diabetes mellitus with hyperglycemia ([place]) Yes  . Mood disorder ([place])   . Morbid obesity ([place])      -reviewed labs with pt -discussed with pt that she needs to talk with Dr Hoyle Barr about her MH medications -increase glipizide to account for pt not able to tolerate the metformin -pt to follow up in 3 months.  RTO sooner prn

## 2018-03-18 ENCOUNTER — Other Ambulatory Visit: Payer: Self-pay

## 2018-03-18 ENCOUNTER — Emergency Department (HOSPITAL_COMMUNITY)
Admission: EM | Admit: 2018-03-18 | Discharge: 2018-03-18 | Disposition: A | Discharge status: Home / Self Care | Payer: Self-pay | Attending: Emergency Medicine | Admitting: Emergency Medicine

## 2018-03-18 ENCOUNTER — Encounter (HOSPITAL_COMMUNITY): Payer: Self-pay | Admitting: Emergency Medicine

## 2018-03-18 ENCOUNTER — Emergency Department (HOSPITAL_COMMUNITY): Payer: Self-pay

## 2018-03-18 DIAGNOSIS — Z79899 Other long term (current) drug therapy: Secondary | ICD-10-CM | POA: Insufficient documentation

## 2018-03-18 DIAGNOSIS — M545 Low back pain, unspecified: Secondary | ICD-10-CM

## 2018-03-18 DIAGNOSIS — E119 Type 2 diabetes mellitus without complications: Secondary | ICD-10-CM | POA: Insufficient documentation

## 2018-03-18 DIAGNOSIS — F17210 Nicotine dependence, cigarettes, uncomplicated: Secondary | ICD-10-CM | POA: Insufficient documentation

## 2018-03-18 DIAGNOSIS — Z7984 Long term (current) use of oral hypoglycemic drugs: Secondary | ICD-10-CM | POA: Insufficient documentation

## 2018-03-18 LAB — URINALYSIS, ROUTINE W REFLEX MICROSCOPIC
Bilirubin Urine: NEGATIVE
GLUCOSE, UA: NEGATIVE mg/dL
KETONES UR: NEGATIVE mg/dL
Leukocytes, UA: NEGATIVE
Nitrite: NEGATIVE
PROTEIN: NEGATIVE mg/dL
Specific Gravity, Urine: 1.014 (ref 1.005–1.030)
pH: 6 (ref 5.0–8.0)

## 2018-03-18 MED ORDER — OXYCODONE-ACETAMINOPHEN 5-325 MG PO TABS: 2.0000 | ORAL_TABLET | ORAL | 0 refills | Status: DC | PRN

## 2018-03-18 NOTE — Discharge Instructions (Signed)
Follow up with your Urologist as scheduled.

## 2018-03-18 NOTE — ED Triage Notes (Signed)
Pt c/o of right flank pain starting Friday with dysuria and increased frequency.

## 2018-03-18 NOTE — ED Provider Notes (Signed)
[facility] EMERGENCY DEPARTMENT Provider Note   CSN: [id number] Arrival date & time: 03/18/18  1124     History   Chief Complaint Chief Complaint  Patient presents with  . Flank Pain    HPI Michaela Aguilar is a 52 y.o. female.  The history is provided by the patient. No language interpreter was used.  Flank Pain  This is a new problem. Episode onset: 3 days. The problem occurs constantly. The problem has been gradually worsening. Nothing aggravates the symptoms. Nothing relieves the symptoms. She has tried nothing for the symptoms. The treatment provided no relief.  Pt feels like she has a kidney stone.  Pt reports she has had before.  Pt reports she is out of flomax and percocet.  Pt is scheduled to see Urology in 3 days   Past Medical History:  Diagnosis Date  . Anxiety   . Bronchitis   . Depression   . Gestational diabetes    1992  . History of kidney stones   . Kidney stones per pt.    Patient Active Problem List   Diagnosis Date Noted  . Chronic pain of right knee 08/16/2017  . Chronic pain of left knee 04/03/2017  . Diabetes mellitus without complication ([place]) 30/86/5784  . Morbid obesity ([place]) 10/26/2016  . Neck pain 11/17/2015  . Low back pain 11/17/2015    Past Surgical History:  Procedure Laterality Date  . CESAREAN SECTION     x2  . CYSTOSCOPY WITH STENT PLACEMENT Right 09/06/2016   Procedure: CYSTOSCOPY WITH RIGHT URETERAL STENT PLACEMENT;  Surgeon: Cleon Gustin, MD;  Location: [facility];  Service: Urology;  Laterality: Right;  . CYSTOSCOPY/RETROGRADE/URETEROSCOPY/STONE EXTRACTION WITH BASKET Right 09/06/2016   Procedure: CYSTOSCOPY/RIGHT RETROGRADE PYELOGRAM, RIGHT RENAL STONE EXTRACTION WITH LASER;  Surgeon: Cleon Gustin, MD;  Location: [facility];  Service: Urology;  Laterality: Right;  . [place] 2010  . HERNIA REPAIR     incisional hernia     OB History    Gravida  2   Para      Term      Preterm      AB      Living  2     SAB      TAB      Ectopic      Multiple      Live Births  2            Home Medications    Prior to Admission medications   Medication Sig Start Date End Date Taking? Authorizing Provider  acetaminophen (TYLENOL) 500 MG tablet Take 500 mg by mouth every 6 (six) hours as needed.   Yes [provider]  DiazePAM (VALIUM PO) Take 0.5 mg by mouth 2 (two) times daily as needed.    Yes [provider]  diclofenac (VOLTAREN) 75 MG EC tablet Take 1 tablet (75 mg total) by mouth 2 (two) times daily as needed. Take with food 01/15/17  Yes Draper, Christia Reading R, DO  glipiZIDE (GLUCOTROL) 5 MG tablet Take 1 tablet (5 mg total) by mouth 2 (two) times daily before a meal. 12/20/17  Yes Soyla Dryer, PA-C  Multiple Vitamins-Minerals (MULTIVITAMIN WITH MINERALS) tablet Take 1 tablet by mouth daily.   Yes [provider]  gabapentin (NEURONTIN) 300 MG capsule Take 1 capsule (300 mg total) by mouth at bedtime as needed. Patient not taking: Reported on 12/20/2017 01/15/17   Thurman Coyer, DO  oxyCODONE-acetaminophen (  PERCOCET/ROXICET) 5-325 MG tablet Take 2 tablets by mouth every 4 (four) hours as needed for severe pain. 03/18/18   Fransico Meadow, PA-C    Family History Family History  Problem Relation Age of Onset  . Osteoporosis Mother   . Depression Father   . Mental illness Father   . Cancer Maternal Uncle        lung cancer  . COPD Maternal Uncle   . Depression Maternal Uncle   . Emphysema Maternal Grandmother   . Cancer Maternal Grandfather        lung cancer  . Breast cancer Neg Hx     Social History Social History   Tobacco Use  . Smoking status: Current Every Day Smoker    Packs/day: 0.25    Years: 15.00    Pack years: 3.75    Types: Cigarettes, Cigars    Last attempt to quit: 2012    Years since quitting: 7.3  . Smokeless tobacco: Never Used  . Tobacco comment: quit smoking cigarretes 2012. pt states she smokes 1  black and mild every 3 days  Substance Use Topics  . Alcohol use: No  . Drug use: No    Types: Marijuana    Comment: last marijuana use early October 2017     Allergies   Patient has no known allergies.   Review of Systems Review of Systems  Genitourinary: Positive for flank pain.  All other systems reviewed and are negative.    Physical Exam Updated Vital Signs BP (!) 144/80 (BP Location: Right Arm)   Pulse 86   Temp 97.9 F (36.6 C) (Oral)   Resp 14   Ht 5\' 5"  (1.651 m)   Wt 108.9 kg (240 lb)   LMP 02/16/2014   SpO2 96%   BMI 39.94 kg/m   Physical Exam  Constitutional: She appears well-developed and well-nourished.  Cardiovascular: Normal rate.  Pulmonary/Chest: Effort normal.  Abdominal: Soft.  Neurological: She is alert.  Skin: Skin is warm.  Psychiatric: She has a normal mood and affect.  Nursing note and vitals reviewed.    ED Treatments / Results  Labs (all labs ordered are listed, but only abnormal results are displayed) Labs Reviewed  URINALYSIS, ROUTINE W REFLEX MICROSCOPIC - Abnormal; Notable for the following components:      Result Value   Hgb urine dipstick MODERATE (*)    Bacteria, UA RARE (*)    All other components within normal limits    EKG None  Radiology Ct Renal Stone Study  Result Date: 03/18/2018 CLINICAL DATA:  Initial evaluation for acute right flank pain. EXAM: CT ABDOMEN AND PELVIS WITHOUT CONTRAST TECHNIQUE: Multidetector CT imaging of the abdomen and pelvis was performed following the standard protocol without IV contrast. COMPARISON:  Prior CT from 08/10/2017. FINDINGS: Lower chest: Visualized lung bases are clear. Hepatobiliary: Liver demonstrates a normal unenhanced appearance. Gallbladder contracted without acute abnormality. No biliary dilatation. Pancreas: Pancreas within normal limits. Spleen: Spleen unremarkable. Adrenals/Urinary Tract: 2.3 cm hypodense left adrenal nodule, consistent with a benign adenoma. Adrenals  otherwise unremarkable. Kidneys equal in size. Nonobstructive right renal nephrolithiasis, largest of which measures 7 mm within the lower pole. Additional nonobstructive 4 mm stone present within the upper pole left kidney. No radiopaque calculi seen along the course of either renal collecting system. No hydronephrosis or hydroureter. Partially distended bladder within normal limits. No layering stones within the bladder lumen. Stomach/Bowel: Stomach within normal limits. No evidence for obstruction. Appendix is normal. Mild colonic diverticulosis  without evidence for acute diverticulitis. No acute inflammatory changes about the bowels. Vascular/Lymphatic: No aneurysm. Mild atherosclerotic change. No adenopathy. Reproductive: Uterus and ovaries within normal limits. Other: No free air or fluid. Small fat containing ventral hernia noted without associated inflammation. Musculoskeletal: No acute osseus abnormality. No worrisome lytic or blastic osseous lesions. IMPRESSION: 1. Bilateral nonobstructive nephrolithiasis as above. No CT evidence for obstructive uropathy. 2. No other acute intra-abdominal or pelvic process. 3. Mild colonic diverticulosis without evidence for acute diverticulitis. 4. Stable left adrenal adenoma. 5. Small fat containing supraumbilical hernia without associated inflammation. Electronically Signed   By: Jeannine Boga M.D.   On: 03/18/2018 14:00    Procedures Procedures (including critical care time)  Medications Ordered in ED Medications - No data to display   Initial Impression / Assessment and Plan / ED Course  I have reviewed the triage vital signs and the nursing notes.  Pertinent labs & imaging results that were available during my care of the patient were reviewed by me and considered in my medical decision making (see chart for details).     MDM  Positive dip for blood  No blood on micro.   Ct scan  Shows stones in kidneys.  Increasing in size from previous Ct's  no hydronephrosis, no passing stones.  Pt is scheduled to see urology in 3 days.  Pt advised to keep appointment as stone size is increasing. I do not feel pt needs flomax at this time.  I will give 10 percocet for pain.   Final Clinical Impressions(s) / ED Diagnoses   Final diagnoses:  Acute low back pain without sciatica, unspecified back pain laterality    ED Discharge Orders        Ordered    oxyCODONE-acetaminophen (PERCOCET/ROXICET) 5-325 MG tablet  Every 4 hours PRN     03/18/18 1437    [place] database reviewed.    Fransico Meadow, [place] 03/18/18 1448    Nat Christen, MD 03/18/18 [phone]

## 2018-03-18 NOTE — ED Notes (Signed)
Patient to Renal Study

## 2018-03-20 ENCOUNTER — Ambulatory Visit: Payer: Self-pay | Admitting: Physician Assistant

## 2018-03-20 ENCOUNTER — Ambulatory Visit (INDEPENDENT_AMBULATORY_CARE_PROVIDER_SITE_OTHER): Payer: Self-pay | Admitting: Urology

## 2018-03-20 DIAGNOSIS — N200 Calculus of kidney: Secondary | ICD-10-CM

## 2018-03-20 DIAGNOSIS — M549 Dorsalgia, unspecified: Secondary | ICD-10-CM

## 2018-03-26 ENCOUNTER — Other Ambulatory Visit (HOSPITAL_COMMUNITY)
Admission: RE | Admit: 2018-03-26 | Discharge: 2018-03-26 | Disposition: A | Discharge status: Home / Self Care | Payer: Self-pay | Source: Ambulatory Visit | Attending: Physician Assistant | Admitting: Physician Assistant

## 2018-03-26 DIAGNOSIS — E1165 Type 2 diabetes mellitus with hyperglycemia: Secondary | ICD-10-CM | POA: Insufficient documentation

## 2018-03-26 LAB — COMPREHENSIVE METABOLIC PANEL
ALK PHOS: 88 U/L (ref 38–126)
ALT: 29 U/L (ref 14–54)
AST: 22 U/L (ref 15–41)
Albumin: 4 g/dL (ref 3.5–5.0)
Anion gap: 8 (ref 5–15)
BILIRUBIN TOTAL: 0.9 mg/dL (ref 0.3–1.2)
BUN: 12 mg/dL (ref 6–20)
CALCIUM: 9.7 mg/dL (ref 8.9–10.3)
CO2: 25 mmol/L (ref 22–32)
CREATININE: 0.66 mg/dL (ref 0.44–1.00)
Chloride: 103 mmol/L (ref 101–111)
GFR calc non Af Amer: >60 mL/min (ref >60)
GLUCOSE: 124 mg/dL — AB (ref 65–99)
Potassium: 4 mmol/L (ref 3.5–5.1)
SODIUM: 136 mmol/L (ref 135–145)
Total Protein: 7.3 g/dL (ref 6.5–8.1)

## 2018-03-26 LAB — LIPID PANEL
Cholesterol: 210 mg/dL — ABNORMAL HIGH (ref 0–200)
HDL: 52 mg/dL (ref >40)
LDL CALC: 121 mg/dL — AB (ref 0–99)
TRIGLYCERIDES: 184 mg/dL — AB (ref <150)
Total CHOL/HDL Ratio: 4 RATIO
VLDL: 37 mg/dL (ref 0–40)

## 2018-03-26 LAB — HEMOGLOBIN A1C
HEMOGLOBIN A1C: 6.4 % — AB (ref 4.8–5.6)
Mean Plasma Glucose: 136.98 mg/dL

## 2018-03-27 ENCOUNTER — Encounter: Payer: Self-pay | Admitting: Physician Assistant

## 2018-03-27 ENCOUNTER — Ambulatory Visit: Payer: Self-pay | Admitting: Physician Assistant

## 2018-03-27 VITALS — BP 125/72 | HR 68 | Temp 97.2°F | Ht 65.0 in | Wt 241.2 lb

## 2018-03-27 DIAGNOSIS — F39 Unspecified mood [affective] disorder: Secondary | ICD-10-CM

## 2018-03-27 DIAGNOSIS — E119 Type 2 diabetes mellitus without complications: Secondary | ICD-10-CM

## 2018-03-27 DIAGNOSIS — E785 Hyperlipidemia, unspecified: Secondary | ICD-10-CM

## 2018-03-27 DIAGNOSIS — F17209 Nicotine dependence, unspecified, with unspecified nicotine-induced disorders: Secondary | ICD-10-CM

## 2018-03-27 LAB — MICROALBUMIN, URINE: MICROALB UR: 4.4 ug/mL — AB

## 2018-03-27 MED ORDER — SIMVASTATIN 20 MG PO TABS: 20.0000 mg | ORAL_TABLET | Freq: Every day | ORAL | 4 refills | Status: DC

## 2018-03-27 NOTE — Progress Notes (Signed)
BP 125/72 (BP Location: Left Arm, Patient Position: Sitting, Cuff Size: Normal)   Pulse 68   Temp (!) 97.2 F (36.2 C)   Ht 5\' 5"  (1.651 m)   Wt 241 lb 4 oz (109.4 kg)   LMP 02/16/2014   SpO2 99%   BMI 40.15 kg/m    Subjective:    Patient ID: Michaela Aguilar, female    DOB: 11/01/1966, 52 y.o.   MRN: [id number]  HPI: Michaela Aguilar is a 52 y.o. female presenting on 03/27/2018 for Diabetes   HPI   Pt is still going to [facility]  Pt is still smoking but is trying to cut back.   Relevant past medical, surgical, family and social history reviewed and updated as indicated. Interim medical history since our last visit reviewed. Allergies and medications reviewed and updated.   Current Outpatient Medications:  .  acetaminophen (TYLENOL) 500 MG tablet, Take 500 mg by mouth every 6 (six) hours as needed., Disp: , Rfl:  .  DiazePAM (VALIUM PO), Take 0.5 mg by mouth 2 (two) times daily as needed. , Disp: , Rfl:  .  diclofenac (VOLTAREN) 75 MG EC tablet, Take 1 tablet (75 mg total) by mouth 2 (two) times daily as needed. Take with food, Disp: 60 tablet, Rfl: 1 .  gabapentin (NEURONTIN) 300 MG capsule, Take 1 capsule (300 mg total) by mouth at bedtime as needed., Disp: 60 capsule, Rfl: 1 .  glipiZIDE (GLUCOTROL) 5 MG tablet, Take 1 tablet (5 mg total) by mouth 2 (two) times daily before a meal., Disp: 60 tablet, Rfl: 3 .  Multiple Vitamins-Minerals (MULTIVITAMIN WITH MINERALS) tablet, Take 1 tablet by mouth daily., Disp: , Rfl:    Review of Systems  Constitutional: Negative for appetite change, chills, diaphoresis, fatigue, fever and unexpected weight change.  HENT: Negative for congestion, dental problem, drooling, ear pain, facial swelling, hearing loss, mouth sores, sneezing, sore throat, trouble swallowing and voice change.   Eyes: Negative for pain, discharge, redness, itching and visual disturbance.  Respiratory: Negative for cough, choking, shortness of breath and wheezing.    Cardiovascular: Positive for leg swelling (knees). Negative for chest pain and palpitations.  Gastrointestinal: Negative for abdominal pain, blood in stool, constipation, diarrhea and vomiting.  Endocrine: Negative for cold intolerance, heat intolerance and polydipsia.  Genitourinary: Negative for decreased urine volume, dysuria and hematuria.  Musculoskeletal: Negative for arthralgias, back pain and gait problem.  Skin: Negative for rash.  Allergic/Immunologic: Negative for environmental allergies.  Neurological: Negative for seizures, syncope, light-headedness and headaches.  Hematological: Negative for adenopathy.  Psychiatric/Behavioral: Positive for agitation and dysphoric mood. Negative for suicidal ideas. The patient is not nervous/anxious.     Per HPI unless specifically indicated above     Objective:    BP 125/72 (BP Location: Left Arm, Patient Position: Sitting, Cuff Size: Normal)   Pulse 68   Temp (!) 97.2 F (36.2 C)   Ht 5\' 5"  (1.651 m)   Wt 241 lb 4 oz (109.4 kg)   LMP 02/16/2014   SpO2 99%   BMI 40.15 kg/m   Wt Readings from Last 3 Encounters:  03/27/18 241 lb 4 oz (109.4 kg)  03/18/18 240 lb (108.9 kg)  12/20/17 241 lb 12 oz (109.7 kg)    Physical Exam  Constitutional: She is oriented to person, place, and time. She appears well-developed and well-nourished.  HENT:  Head: Normocephalic and atraumatic.  Neck: Neck supple.  Cardiovascular: Normal rate and regular rhythm.  Pulmonary/Chest: Effort  normal and breath sounds normal.  Abdominal: Soft. Bowel sounds are normal. She exhibits no mass. There is no hepatosplenomegaly. There is no tenderness.  Musculoskeletal: She exhibits no edema.  Lymphadenopathy:    She has no cervical adenopathy.  Neurological: She is alert and oriented to person, place, and time.  Skin: Skin is warm and dry.  Psychiatric: She has a normal mood and affect. Her behavior is normal.  Vitals reviewed.   Results for orders placed  or performed during the hospital encounter of 03/26/18  Comprehensive metabolic panel  Result Value Ref Range   Sodium 136 135 - 145 mmol/L   Potassium 4.0 3.5 - 5.1 mmol/L   Chloride 103 101 - 111 mmol/L   CO2 25 22 - 32 mmol/L   Glucose, Bld 124 (H) 65 - 99 mg/dL   BUN 12 6 - 20 mg/dL   Creatinine, Ser 0.66 0.44 - 1.00 mg/dL   Calcium 9.7 8.9 - 10.3 mg/dL   Total Protein 7.3 6.5 - 8.1 g/dL   Albumin 4.0 3.5 - 5.0 g/dL   AST 22 15 - 41 U/L   ALT 29 14 - 54 U/L   Alkaline Phosphatase 88 38 - 126 U/L   Total Bilirubin 0.9 0.3 - 1.2 mg/dL   GFR calc non Af Amer >60 >60 mL/min   GFR calc Af Amer >60 >60 mL/min   Anion gap 8 5 - 15  Lipid panel  Result Value Ref Range   Cholesterol 210 (H) 0 - 200 mg/dL   Triglycerides 184 (H) <150 mg/dL   HDL 52 >40 mg/dL   Total CHOL/HDL Ratio 4.0 RATIO   VLDL 37 0 - 40 mg/dL   LDL Cholesterol 121 (H) 0 - 99 mg/dL  Hemoglobin A1c  Result Value Ref Range   Hgb A1c MFr Bld 6.4 (H) 4.8 - 5.6 %   Mean Plasma Glucose 136.98 mg/dL      Assessment & Plan:    Encounter Diagnoses  Name Primary?  . Controlled type 2 diabetes mellitus without complication, without long-term current use of insulin ([place]) Yes  . Hyperlipidemia, unspecified hyperlipidemia type   . Morbid obesity ([place])   . Nicotine dependence with nicotine-induced disorder, unspecified nicotine product type   . Mood disorder ([place])     -reviewed labs with pt -Add simvastatin.  Continue low-fat diet -continue current medications for diabetes -counseled smoking cessation -pt to follow up 3 months.  RTO sooner prn

## 2018-05-13 ENCOUNTER — Other Ambulatory Visit: Payer: Self-pay | Admitting: Physician Assistant

## 2018-06-13 ENCOUNTER — Other Ambulatory Visit: Payer: Self-pay

## 2018-06-13 ENCOUNTER — Emergency Department (HOSPITAL_COMMUNITY): Payer: Self-pay

## 2018-06-13 ENCOUNTER — Observation Stay (HOSPITAL_COMMUNITY)
Admission: EM | Admit: 2018-06-13 | Discharge: 2018-06-15 | Disposition: A | Discharge status: Home / Self Care | Payer: Self-pay | Attending: Internal Medicine | Admitting: Internal Medicine

## 2018-06-13 ENCOUNTER — Encounter (HOSPITAL_COMMUNITY): Payer: Self-pay | Admitting: *Deleted

## 2018-06-13 DIAGNOSIS — F17210 Nicotine dependence, cigarettes, uncomplicated: Secondary | ICD-10-CM | POA: Insufficient documentation

## 2018-06-13 DIAGNOSIS — Z720 Tobacco use: Secondary | ICD-10-CM

## 2018-06-13 DIAGNOSIS — J449 Chronic obstructive pulmonary disease, unspecified: Secondary | ICD-10-CM | POA: Diagnosis present

## 2018-06-13 DIAGNOSIS — E669 Obesity, unspecified: Secondary | ICD-10-CM | POA: Diagnosis present

## 2018-06-13 DIAGNOSIS — E119 Type 2 diabetes mellitus without complications: Secondary | ICD-10-CM | POA: Insufficient documentation

## 2018-06-13 DIAGNOSIS — R0600 Dyspnea, unspecified: Secondary | ICD-10-CM

## 2018-06-13 DIAGNOSIS — R062 Wheezing: Secondary | ICD-10-CM

## 2018-06-13 DIAGNOSIS — J410 Simple chronic bronchitis: Secondary | ICD-10-CM

## 2018-06-13 DIAGNOSIS — Z79899 Other long term (current) drug therapy: Secondary | ICD-10-CM | POA: Insufficient documentation

## 2018-06-13 DIAGNOSIS — J441 Chronic obstructive pulmonary disease with (acute) exacerbation: Principal | ICD-10-CM | POA: Insufficient documentation

## 2018-06-13 DIAGNOSIS — J40 Bronchitis, not specified as acute or chronic: Secondary | ICD-10-CM | POA: Diagnosis present

## 2018-06-13 DIAGNOSIS — Z7984 Long term (current) use of oral hypoglycemic drugs: Secondary | ICD-10-CM | POA: Insufficient documentation

## 2018-06-13 LAB — CBC
HEMATOCRIT: 42.3 % (ref 36.0–46.0)
Hemoglobin: 14.6 g/dL (ref 12.0–15.0)
MCH: 33.1 pg (ref 26.0–34.0)
MCHC: 34.5 g/dL (ref 30.0–36.0)
MCV: 95.9 fL (ref 78.0–100.0)
Platelets: 359 10*3/uL (ref 150–400)
RBC: 4.41 MIL/uL (ref 3.87–5.11)
RDW: 12.1 % (ref 11.5–15.5)
WBC: 15.7 10*3/uL — ABNORMAL HIGH (ref 4.0–10.5)

## 2018-06-13 LAB — BASIC METABOLIC PANEL
Anion gap: 10 (ref 5–15)
BUN: 11 mg/dL (ref 6–20)
CO2: 25 mmol/L (ref 22–32)
Calcium: 9.8 mg/dL (ref 8.9–10.3)
Chloride: 104 mmol/L (ref 98–111)
Creatinine, Ser: 0.66 mg/dL (ref 0.44–1.00)
GFR calc Af Amer: >60 mL/min (ref >60)
Glucose, Bld: 146 mg/dL — ABNORMAL HIGH (ref 70–99)
POTASSIUM: 4 mmol/L (ref 3.5–5.1)
Sodium: 139 mmol/L (ref 135–145)

## 2018-06-13 LAB — GLUCOSE, CAPILLARY: GLUCOSE-CAPILLARY: 352 mg/dL — AB (ref 70–99)

## 2018-06-13 LAB — TROPONIN I: Troponin I: <0.03 ng/mL (ref <0.03)

## 2018-06-13 MED ORDER — INSULIN ASPART 100 UNIT/ML ~~LOC~~ SOLN
0.0000 [IU] | Freq: Three times a day (TID) | SUBCUTANEOUS | Status: DC
  Administered 2018-06-14: 7 [IU] via SUBCUTANEOUS
  Administered 2018-06-14: 3 [IU] via SUBCUTANEOUS

## 2018-06-13 MED ORDER — PREDNISONE 50 MG PO TABS
60.0000 mg | ORAL_TABLET | Freq: Once | ORAL | Status: AC
  Administered 2018-06-13: 60 mg via ORAL
  Filled 2018-06-13: qty 1

## 2018-06-13 MED ORDER — ENOXAPARIN SODIUM 40 MG/0.4ML ~~LOC~~ SOLN
40.0000 mg | SUBCUTANEOUS | Status: DC
  Administered 2018-06-13 – 2018-06-14 (×2): 40 mg via SUBCUTANEOUS
  Filled 2018-06-13 (×2): qty 0.4

## 2018-06-13 MED ORDER — ALBUTEROL SULFATE (2.5 MG/3ML) 0.083% IN NEBU
5.0000 mg | INHALATION_SOLUTION | Freq: Once | RESPIRATORY_TRACT | Status: AC
  Administered 2018-06-13: 5 mg via RESPIRATORY_TRACT
  Filled 2018-06-13: qty 6

## 2018-06-13 MED ORDER — ONDANSETRON HCL 4 MG/2ML IJ SOLN: 4.0000 mg | Freq: Four times a day (QID) | INTRAMUSCULAR | Status: DC | PRN

## 2018-06-13 MED ORDER — DOXYCYCLINE HYCLATE 100 MG PO TABS
100.0000 mg | ORAL_TABLET | Freq: Once | ORAL | Status: AC
  Administered 2018-06-13: 100 mg via ORAL
  Filled 2018-06-13: qty 1

## 2018-06-13 MED ORDER — ACETAMINOPHEN 650 MG RE SUPP: 650.0000 mg | Freq: Four times a day (QID) | RECTAL | Status: DC | PRN

## 2018-06-13 MED ORDER — MULTI-VITAMIN/MINERALS PO TABS: 1.0000 | ORAL_TABLET | Freq: Every day | ORAL | Status: DC

## 2018-06-13 MED ORDER — METHYLPREDNISOLONE SODIUM SUCC 125 MG IJ SOLR
60.0000 mg | Freq: Two times a day (BID) | INTRAMUSCULAR | Status: DC
  Administered 2018-06-13 – 2018-06-14 (×2): 60 mg via INTRAVENOUS
  Filled 2018-06-13 (×2): qty 2

## 2018-06-13 MED ORDER — ONDANSETRON HCL 4 MG PO TABS: 4.0000 mg | ORAL_TABLET | Freq: Four times a day (QID) | ORAL | Status: DC | PRN

## 2018-06-13 MED ORDER — IPRATROPIUM-ALBUTEROL 0.5-2.5 (3) MG/3ML IN SOLN
3.0000 mL | Freq: Four times a day (QID) | RESPIRATORY_TRACT | Status: DC
  Administered 2018-06-13 – 2018-06-15 (×6): 3 mL via RESPIRATORY_TRACT
  Filled 2018-06-13 (×6): qty 3

## 2018-06-13 MED ORDER — SIMVASTATIN 20 MG PO TABS
20.0000 mg | ORAL_TABLET | Freq: Every day | ORAL | Status: DC
  Administered 2018-06-13 – 2018-06-14 (×2): 20 mg via ORAL
  Filled 2018-06-13 (×2): qty 1

## 2018-06-13 MED ORDER — GLIPIZIDE 5 MG PO TABS: 5.0000 mg | ORAL_TABLET | Freq: Two times a day (BID) | ORAL | Status: DC

## 2018-06-13 MED ORDER — ALBUTEROL SULFATE (2.5 MG/3ML) 0.083% IN NEBU: 2.5000 mg | INHALATION_SOLUTION | RESPIRATORY_TRACT | Status: DC | PRN

## 2018-06-13 MED ORDER — SODIUM CHLORIDE 0.45 % IV SOLN
INTRAVENOUS | Status: DC
  Administered 2018-06-13 – 2018-06-15 (×4): via INTRAVENOUS

## 2018-06-13 MED ORDER — ACETAMINOPHEN 325 MG PO TABS
650.0000 mg | ORAL_TABLET | Freq: Once | ORAL | Status: AC
  Administered 2018-06-13: 650 mg via ORAL
  Filled 2018-06-13: qty 2

## 2018-06-13 MED ORDER — ACETAMINOPHEN 325 MG PO TABS
650.0000 mg | ORAL_TABLET | Freq: Four times a day (QID) | ORAL | Status: DC | PRN
  Administered 2018-06-14: 650 mg via ORAL
  Filled 2018-06-13: qty 2

## 2018-06-13 MED ORDER — ALBUTEROL SULFATE (2.5 MG/3ML) 0.083% IN NEBU
10.0000 mg | INHALATION_SOLUTION | Freq: Once | RESPIRATORY_TRACT | Status: AC
  Administered 2018-06-13: 10 mg via RESPIRATORY_TRACT
  Filled 2018-06-13: qty 12

## 2018-06-13 MED ORDER — ADULT MULTIVITAMIN W/MINERALS CH
1.0000 | ORAL_TABLET | Freq: Every day | ORAL | Status: DC
  Administered 2018-06-13 – 2018-06-15 (×3): 1 via ORAL
  Filled 2018-06-13 (×3): qty 1

## 2018-06-13 MED ORDER — DOXYCYCLINE HYCLATE 100 MG PO TABS
100.0000 mg | ORAL_TABLET | Freq: Two times a day (BID) | ORAL | Status: DC
  Administered 2018-06-13 – 2018-06-15 (×4): 100 mg via ORAL
  Filled 2018-06-13 (×4): qty 1

## 2018-06-13 MED ORDER — GLIPIZIDE 5 MG PO TABS
5.0000 mg | ORAL_TABLET | Freq: Two times a day (BID) | ORAL | Status: DC
  Administered 2018-06-13 – 2018-06-15 (×4): 5 mg via ORAL
  Filled 2018-06-13 (×4): qty 1

## 2018-06-13 MED ORDER — HYDROCODONE-ACETAMINOPHEN 5-325 MG PO TABS
1.0000 | ORAL_TABLET | ORAL | Status: DC | PRN
  Administered 2018-06-14: 1 via ORAL
  Filled 2018-06-13: qty 1

## 2018-06-13 MED ORDER — GABAPENTIN 300 MG PO CAPS
300.0000 mg | ORAL_CAPSULE | Freq: Every day | ORAL | Status: DC
  Administered 2018-06-13 – 2018-06-14 (×2): 300 mg via ORAL
  Filled 2018-06-13 (×2): qty 1

## 2018-06-13 NOTE — Progress Notes (Signed)
Patients blood sugar 352, states that she normally takes glipizide but did not take today. Notified MD, new order for glipizide 5mg . Will continue to monitor.

## 2018-06-13 NOTE — ED Triage Notes (Signed)
Chest pain with shortness of breath ?

## 2018-06-13 NOTE — ED Provider Notes (Signed)
[facility] EMERGENCY DEPARTMENT Provider Note   CSN: [id number] Arrival date & time: 06/13/18  1056     History   Chief Complaint Chief Complaint  Patient presents with  . Chest Pain  . Shortness of Breath    HPI Michaela Aguilar is a 52 y.o. female.  HPI  52 yo female complaining of cough, fever, dyspnea began with laryngitis 3 days pte.  Some subjective fevers.  Cough with some mucous yellowish.  Pain is sharp and pressure like with coughing.  Decreased po intake due to dyspnea, but no nausea, vomiting or diarrhea.  No known sick contacts.  Patient with 25 years of smoking history but states she stopped Monday with onset of symptoms.   Past Medical History:  Diagnosis Date  . Anxiety   . Bronchitis   . Depression   . Gestational diabetes    1992  . History of kidney stones   . Kidney stones per pt.    Patient Active Problem List   Diagnosis Date Noted  . Chronic pain of right knee 08/16/2017  . Chronic pain of left knee 04/03/2017  . Diabetes mellitus without complication ([place]) 42/70/6237  . Morbid obesity ([place]) 10/26/2016  . Neck pain 11/17/2015  . Low back pain 11/17/2015    Past Surgical History:  Procedure Laterality Date  . CESAREAN SECTION     x2  . CYSTOSCOPY WITH STENT PLACEMENT Right 09/06/2016   Procedure: CYSTOSCOPY WITH RIGHT URETERAL STENT PLACEMENT;  Surgeon: Cleon Gustin, MD;  Location: [facility];  Service: Urology;  Laterality: Right;  . CYSTOSCOPY/RETROGRADE/URETEROSCOPY/STONE EXTRACTION WITH BASKET Right 09/06/2016   Procedure: CYSTOSCOPY/RIGHT RETROGRADE PYELOGRAM, RIGHT RENAL STONE EXTRACTION WITH LASER;  Surgeon: Cleon Gustin, MD;  Location: [facility];  Service: Urology;  Laterality: Right;  . [place] 2010  . HERNIA REPAIR     incisional hernia     OB History    Gravida  2   Para      Term      Preterm      AB      Living  2     SAB      TAB      Ectopic      Multiple      Live  Births  2            Home Medications    Prior to Admission medications   Medication Sig Start Date End Date Taking? Authorizing Provider  acetaminophen (TYLENOL) 500 MG tablet Take 500 mg by mouth every 6 (six) hours as needed.   Yes [provider]  DiazePAM (VALIUM PO) Take 0.5 mg by mouth 2 (two) times daily as needed.    Yes [provider]  gabapentin (NEURONTIN) 300 MG capsule Take 1 capsule (300 mg total) by mouth at bedtime as needed. 01/15/17  Yes Draper, Christia Reading R, DO  glipiZIDE (GLUCOTROL) 5 MG tablet TAKE 1 TABLET BY MOUTH TWICE DAILY BEFORE  A  MEAL 05/13/18  Yes Soyla Dryer, PA-C  Multiple Vitamins-Minerals (MULTIVITAMIN WITH MINERALS) tablet Take 1 tablet by mouth daily.   Yes [provider]  simvastatin (ZOCOR) 20 MG tablet Take 1 tablet (20 mg total) by mouth at bedtime. 03/27/18  Yes Soyla Dryer, PA-C  diclofenac (VOLTAREN) 75 MG EC tablet Take 1 tablet (75 mg total) by mouth 2 (two) times daily as needed. Take with food Patient not taking: Reported on 06/13/2018 01/15/17   Lilia Argue  R, DO    Family History Family History  Problem Relation Age of Onset  . Osteoporosis Mother   . Depression Father   . Mental illness Father   . Cancer Maternal Uncle        lung cancer  . COPD Maternal Uncle   . Depression Maternal Uncle   . Emphysema Maternal Grandmother   . Cancer Maternal Grandfather        lung cancer  . Breast cancer Neg Hx     Social History Social History   Tobacco Use  . Smoking status: Current Every Day Smoker    Packs/day: 0.50    Years: 15.00    Pack years: 7.50    Types: Cigarettes, Cigars  . Smokeless tobacco: Never Used  . Tobacco comment: pt states she smokes 1 black and mild every 3 days  Substance Use Topics  . Alcohol use: No  . Drug use: No    Types: Marijuana    Comment: last marijuana use early October 2017     Allergies   Patient has no known allergies.   Review of Systems Review of  Systems  All other systems reviewed and are negative.    Physical Exam Updated Vital Signs BP 124/72   Pulse 90   Temp 99.1 F (37.3 C) (Oral)   Resp 17   Ht 1.651 m (5\' 5" )   Wt 108.9 kg (240 lb)   LMP 02/16/2014   SpO2 94%   BMI 39.94 kg/m   Physical Exam  Constitutional: She appears well-developed and well-nourished.  HENT:  Head: Normocephalic and atraumatic.  Eyes: Pupils are equal, round, and reactive to light. EOM are normal.  Neck: Normal range of motion. Neck supple.  Cardiovascular: Regular rhythm and normal pulses.  Pulmonary/Chest: She has wheezes in the right upper field, the right middle field, the right lower field, the left upper field, the left middle field and the left lower field.  Musculoskeletal: Normal range of motion.       Right lower leg: Normal.       Left lower leg: Normal.  Neurological: She is alert.  Skin: Skin is warm. Capillary refill takes less than 2 seconds.  Psychiatric: She has a normal mood and affect.  Nursing note and vitals reviewed.    ED Treatments / Results  Labs (all labs ordered are listed, but only abnormal results are displayed) Labs Reviewed  BASIC METABOLIC PANEL - Abnormal; Notable for the following components:      Result Value   Glucose, Bld 146 (*)    All other components within normal limits  CBC - Abnormal; Notable for the following components:   WBC 15.7 (*)    All other components within normal limits  TROPONIN I    EKG EKG Interpretation  Date/Time:  Thursday June 13 2018 11:03:48 EDT Ventricular Rate:  90 PR Interval:    QRS Duration: 85 QT Interval:  349 QTC Calculation: 427 R Axis:   98 Text Interpretation:  Sinus rhythm Borderline right axis deviation Low voltage, precordial leads Probable anteroseptal infarct, old No significant change since last tracing Confirmed by Pattricia Boss [phone]) on 06/13/2018 11:18:54 AM   Radiology Dg Chest 2 View  Result Date: 06/13/2018 CLINICAL DATA:  Chest  pain, shortness of breath EXAM: CHEST - 2 VIEW COMPARISON:  04/23/2016 FINDINGS: Lungs are clear.  No pleural effusion or pneumothorax. The heart is normal in size. Mild degenerative changes of the visualized thoracolumbar spine. IMPRESSION: Normal chest radiographs.  Electronically Signed   By: Julian Hy M.D.   On: 06/13/2018 12:03    Procedures Procedures (including critical care time)  Medications Ordered in ED Medications - No data to display   Initial Impression / Assessment and Plan / ED Course  I have reviewed the triage vital signs and the nursing notes.  Pertinent labs & imaging results that were available during my care of the patient were reviewed by me and considered in my medical decision making (see chart for details).     1:22 PM Patient reassessed after first nebulizer treatment states she feels somewhat improved but continues to have diffuse expiratory wheezing with increased work of breathing 94% Albuterol 10 mg neb ordered Patient has already received prednisone by mouth. 3:31 PM Albuterol nebulizer in place.  Decreased wob, improved air movement, continues with wheezing 4:07 PM Continues with increased work of breathing, diffuse wheezing.  O2 sats 94% on room air plan admission for further treatment and evaluation Final Clinical Impressions(s) / ED Diagnoses   Final diagnoses:  COPD exacerbation [facility])    ED Discharge Orders    None       Pattricia Boss, MD 06/14/18 [phone]

## 2018-06-13 NOTE — H&P (Signed)
[facility] History and Physical  Michaela Aguilar YWV:[id number] DOB: 11-Sep-1966 DOA: 06/13/2018  Referring physician: Dr. Roxanne Mins   PCP: Soyla Dryer, PA-C   Chief Complaint: SOB/ cough/ wheezing  HPI: Michaela Aguilar is a 52 y.o. female with hx of NIDDM , kidney stones, depression and bronchitis presented to ED w/ SOB, also some CP w/ coughing.  Per ED was treated as COPD exacerbation w/ IV steroids and nebulizers , didn't improve much so asked to admit.    Pt deneis active chest pain, pain was w/ coughing mostly. Had laryngitis per patient over the weekend and now having SOB and cough mostly, no sore throat, no fevers or swollen glands. +prod cough.  Sig dyspnea, "couldn't catch my breath". Starting to feel better now, 7 hrs after presentation. No hx COPD or asthma, hx of "bronchitis" when seen years ago one time.  No hosp admission for any lung issue.  No hx PNA.  +smoker 1/2 ppd approx 20 yrs.  Single lives w/ a family member lives in [place], recently started on meds for DM.  C-section x 2 and hernia surgery.   ROS  no joint pain   no HA  no blurry vision  no rash  no diarrhea  no nausea/ vomiting  no dysuria  no difficulty voiding  no change in urine color    Past Medical History  Past Medical History:  Diagnosis Date  . Anxiety   . Bronchitis   . Depression   . Gestational diabetes    1992  . History of kidney stones   . Kidney stones per pt.   Past Surgical History  Past Surgical History:  Procedure Laterality Date  . CESAREAN SECTION     x2  . CYSTOSCOPY WITH STENT PLACEMENT Right 09/06/2016   Procedure: CYSTOSCOPY WITH RIGHT URETERAL STENT PLACEMENT;  Surgeon: Cleon Gustin, MD;  Location: [facility];  Service: Urology;  Laterality: Right;  . CYSTOSCOPY/RETROGRADE/URETEROSCOPY/STONE EXTRACTION WITH BASKET Right 09/06/2016   Procedure: CYSTOSCOPY/RIGHT RETROGRADE PYELOGRAM, RIGHT RENAL STONE EXTRACTION WITH LASER;  Surgeon: Cleon Gustin, MD;  Location:  [facility];  Service: Urology;  Laterality: Right;  . [place] 2010  . HERNIA REPAIR     incisional hernia   Family History  Family History  Problem Relation Age of Onset  . Osteoporosis Mother   . Depression Father   . Mental illness Father   . Cancer Maternal Uncle        lung cancer  . COPD Maternal Uncle   . Depression Maternal Uncle   . Emphysema Maternal Grandmother   . Cancer Maternal Grandfather        lung cancer  . Breast cancer Neg Hx    Social History  reports that she has been smoking cigarettes and cigars.  She has a 7.50 pack-year smoking history. She has never used smokeless tobacco. She reports that she does not drink alcohol or use drugs. Allergies No Known Allergies Home medications Prior to Admission medications   Medication Sig Start Date End Date Taking? Authorizing Provider  acetaminophen (TYLENOL) 500 MG tablet Take 500 mg by mouth every 6 (six) hours as needed.   Yes [provider]  DiazePAM (VALIUM PO) Take 0.5 mg by mouth 2 (two) times daily as needed.    Yes [provider]  gabapentin (NEURONTIN) 300 MG capsule Take 1 capsule (300 mg total) by mouth at bedtime as needed. 01/15/17  Yes Thurman Coyer, DO  glipiZIDE (GLUCOTROL) 5 MG tablet TAKE 1 TABLET BY MOUTH TWICE DAILY BEFORE  A  MEAL 05/13/18  Yes Soyla Dryer, PA-C  Multiple Vitamins-Minerals (MULTIVITAMIN WITH MINERALS) tablet Take 1 tablet by mouth daily.   Yes [provider]  simvastatin (ZOCOR) 20 MG tablet Take 1 tablet (20 mg total) by mouth at bedtime. 03/27/18  Yes Soyla Dryer, PA-C  diclofenac (VOLTAREN) 75 MG EC tablet Take 1 tablet (75 mg total) by mouth 2 (two) times daily as needed. Take with food Patient not taking: Reported on 06/13/2018 01/15/17   Thurman Coyer, DO   Liver Function Tests No results for input(s): AST, ALT, ALKPHOS, BILITOT, PROT, ALBUMIN in the last 168 hours. No results for input(s): LIPASE, AMYLASE  in the last 168 hours. CBC Recent Labs  Lab 06/13/18 1145  WBC 15.7*  HGB 14.6  HCT 42.3  MCV 95.9  PLT 371   Basic Metabolic Panel Recent Labs  Lab 06/13/18 1145  NA 139  K 4.0  CL 104  CO2 25  GLUCOSE 146*  BUN 11  CREATININE 0.66  CALCIUM 9.8     Vitals:   06/13/18 1130 06/13/18 1145 06/13/18 1200 06/13/18 1901  BP: 123/80  124/72 129/61  Pulse: 75 90 90   Resp: (!) 24 16 17    Temp:      TempSrc:      SpO2: 94% 96% 94%   Weight:      Height:       Exam: Gena alert obese pleasant WF no distress,  No rash, cyanosis or gangrene Sclera anicteric, throat clear No jvd or bruits or nodes Chest diffuse moderate exp wheezing, occ rhonchit, no bronch BS RRR no MRG Abd soft ntnd no mass or ascites +bs obese GU defer MS no joint effusions or deformity Ext no leg or UE edema / no wounds or ulcers Neuro is alert, Ox 3 , nf    Home meds: - valium 5mg  bid prn/ glipizide 5 mg bid ac/ MVI/ simvastatin 20 qd/ voltaren prn    EKG (independ reviewed) > NSR no acute changes CXR (independ reviewed) > normal CXR   Assessment: 1)  Acute wheezing/ SOB - suspect pt has COPD from tobacco abuse.  Will plan admit, IV steroids and nebs, cont po doxy started in ED, possible bronchitis.     2)  DM2 - on glipizide at home, continue and add SSI here  3)  Obesity  Plan - as above       [place] D [facility] Pager [phone]   If 7PM-7AM, please contact night-coverage [email or web address] Password [email or web address] 06/13/2018, 7:15 PM

## 2018-06-13 NOTE — ED Provider Notes (Signed)
Patient with newly diagnosed COPD presenting with acute exacerbation which has failed to respond to steroids and aggressive use of beta agonists via nebulizer.  Case is discussed with Dr. Jonnie Finner of [facility], who agrees to admit the patient.   Delora Fuel, MD 10/31/74 1700

## 2018-06-14 LAB — GLUCOSE, CAPILLARY
GLUCOSE-CAPILLARY: 244 mg/dL — AB (ref 70–99)
GLUCOSE-CAPILLARY: 312 mg/dL — AB (ref 70–99)
Glucose-Capillary: 334 mg/dL — ABNORMAL HIGH (ref 70–99)
Glucose-Capillary: 269 mg/dL — ABNORMAL HIGH (ref 70–99)

## 2018-06-14 MED ORDER — INSULIN ASPART 100 UNIT/ML ~~LOC~~ SOLN
4.0000 [IU] | Freq: Three times a day (TID) | SUBCUTANEOUS | Status: DC
  Administered 2018-06-14 – 2018-06-15 (×3): 4 [IU] via SUBCUTANEOUS

## 2018-06-14 MED ORDER — IBUPROFEN 800 MG PO TABS
800.0000 mg | ORAL_TABLET | Freq: Four times a day (QID) | ORAL | Status: DC | PRN
  Administered 2018-06-14: 800 mg via ORAL
  Filled 2018-06-14: qty 1

## 2018-06-14 MED ORDER — KETOROLAC TROMETHAMINE 30 MG/ML IJ SOLN
30.0000 mg | Freq: Once | INTRAMUSCULAR | Status: AC
  Administered 2018-06-14: 30 mg via INTRAVENOUS
  Filled 2018-06-14: qty 1

## 2018-06-14 MED ORDER — METHYLPREDNISOLONE SODIUM SUCC 40 MG IJ SOLR
40.0000 mg | Freq: Two times a day (BID) | INTRAMUSCULAR | Status: DC
  Administered 2018-06-14 – 2018-06-15 (×2): 40 mg via INTRAVENOUS
  Filled 2018-06-14 (×2): qty 1

## 2018-06-14 MED ORDER — FLUTICASONE PROPIONATE 50 MCG/ACT NA SUSP
1.0000 | Freq: Every day | NASAL | Status: DC
  Administered 2018-06-14 – 2018-06-15 (×2): 1 via NASAL
  Filled 2018-06-14: qty 16

## 2018-06-14 MED ORDER — INSULIN ASPART 100 UNIT/ML ~~LOC~~ SOLN
0.0000 [IU] | Freq: Three times a day (TID) | SUBCUTANEOUS | Status: DC
  Administered 2018-06-14: 15 [IU] via SUBCUTANEOUS
  Administered 2018-06-15: 7 [IU] via SUBCUTANEOUS
  Administered 2018-06-15: 11 [IU] via SUBCUTANEOUS

## 2018-06-14 MED ORDER — INSULIN ASPART 100 UNIT/ML ~~LOC~~ SOLN
0.0000 [IU] | Freq: Every day | SUBCUTANEOUS | Status: DC
  Administered 2018-06-14: 3 [IU] via SUBCUTANEOUS

## 2018-06-14 NOTE — Progress Notes (Signed)
PROGRESS NOTE    Michaela Aguilar  MVE:[id number] DOB: 03-25-1966 DOA: 06/13/2018 PCP: Soyla Dryer, PA-C   Brief Narrative:   Michaela Aguilar is a 52 y.o. female with hx of NIDDM , kidney stones, depression and bronchitis presented to ED w/ SOB, also some CP w/ coughing.  Per ED was treated as COPD exacerbation w/ IV steroids and nebulizers she continues to have some improvement today but remains somewhat short of breath and has some coughing as well.  She has also had a headache with minimal improvement despite medications.   Assessment & Plan:   Active Problems:   Diabetes mellitus without complication (HCC)   Morbid obesity ([place])   Acute dyspnea   Wheezing   COPD with acute exacerbation (HCC)   Bronchitis due to tobacco use ([place])   1. Acute asthma versus COPD exacerbation.  Continue breathing treatments as well as steroids along with doxycycline for possible bronchitis.  She appears to have slow improvement. 2. Type 2 diabetes with hyperglycemia-steroid-induced.  Increase sliding scale insulin coverage and taper steroids today.  Monitor closely.  Continue home glipizide. 3. Intractable headache.  Suspect occipital headache versus sinus related.  Will start ibuprofen as needed as well as Flonase. 4. Tobacco abuse.  Cessation counseling provided. 5. Obesity.   DVT prophylaxis:Lovenox Code Status: Full Family Communication: Roommate at bedside Disposition Plan: Continue AECOPD treatment and HA management; plan for DC in AM if improved   Consultants:   None  Procedures:   None  Antimicrobials:   Doxycycline 06/13/18->   Subjective: Patient seen and evaluated today with concerns for ongoing frontal headache as well as pressure sensation. No acute concerns or events noted overnight.  Objective: Vitals:   06/14/18 0209 06/14/18 0541 06/14/18 0759 06/14/18 1303  BP:  (!) 104/51  (!) 145/51  Pulse:  70  82  Resp:  18  16  Temp:  98.3 F (36.8 C)  97.7 F (36.5 C)    TempSrc:  Oral  Oral  SpO2: 95% 95% 97% 98%  Weight:      Height:        Intake/Output Summary (Last 24 hours) at 06/14/2018 1324 Last data filed at 06/14/2018 0900 Gross per 24 hour  Intake 1036.67 ml  Output -  Net 1036.67 ml   Filed Weights   06/13/18 1104 06/13/18 2000  Weight: 108.9 kg (240 lb) 107.6 kg (237 lb 3.4 oz)    Examination:  General exam: Appears calm and comfortable  Respiratory system: Clear to auscultation. Respiratory effort normal. Cardiovascular system: S1 & S2 heard, RRR. No JVD, murmurs, rubs, gallops or clicks. No pedal edema. Gastrointestinal system: Abdomen is nondistended, soft and nontender. No organomegaly or masses felt. Normal bowel sounds heard. Central nervous system: Alert and oriented. No focal neurological deficits. Extremities: Symmetric 5 x 5 power. Skin: No rashes, lesions or ulcers Psychiatry: Judgement and insight appear normal. Mood & affect appropriate.     Data Reviewed: I have personally reviewed following labs and imaging studies  CBC: Recent Labs  Lab 06/13/18 1145  WBC 15.7*  HGB 14.6  HCT 42.3  MCV 95.9  PLT 283   Basic Metabolic Panel: Recent Labs  Lab 06/13/18 1145  NA 139  K 4.0  CL 104  CO2 25  GLUCOSE 146*  BUN 11  CREATININE 0.66  CALCIUM 9.8   GFR: Estimated Creatinine Clearance: 98 mL/min (by C-G formula based on SCr of 0.66 mg/dL). Liver Function Tests: No results for input(s): AST, ALT,  ALKPHOS, BILITOT, PROT, ALBUMIN in the last 168 hours. No results for input(s): LIPASE, AMYLASE in the last 168 hours. No results for input(s): AMMONIA in the last 168 hours. Coagulation Profile: No results for input(s): INR, PROTIME in the last 168 hours. Cardiac Enzymes: Recent Labs  Lab 06/13/18 1145  TROPONINI <0.03   BNP (last 3 results) No results for input(s): PROBNP in the last 8760 hours. HbA1C: No results for input(s): HGBA1C in the last 72 hours. CBG: Recent Labs  Lab 06/13/18 2014  06/14/18 0814 06/14/18 1127  GLUCAP 352* 244* 334*   Lipid Profile: No results for input(s): CHOL, HDL, LDLCALC, TRIG, CHOLHDL, LDLDIRECT in the last 72 hours. Thyroid Function Tests: No results for input(s): TSH, T4TOTAL, FREET4, T3FREE, THYROIDAB in the last 72 hours. Anemia Panel: No results for input(s): VITAMINB12, FOLATE, FERRITIN, TIBC, IRON, RETICCTPCT in the last 72 hours. Sepsis Labs: No results for input(s): PROCALCITON, LATICACIDVEN in the last 168 hours.  No results found for this or any previous visit (from the past 240 hour(s)).       Radiology Studies: Dg Chest 2 View  Result Date: 06/13/2018 CLINICAL DATA:  Chest pain, shortness of breath EXAM: CHEST - 2 VIEW COMPARISON:  04/23/2016 FINDINGS: Lungs are clear.  No pleural effusion or pneumothorax. The heart is normal in size. Mild degenerative changes of the visualized thoracolumbar spine. IMPRESSION: Normal chest radiographs. Electronically Signed   By: Julian Hy M.D.   On: 06/13/2018 12:03        Scheduled Meds: . doxycycline  100 mg Oral Q12H  . enoxaparin (LOVENOX) injection  40 mg Subcutaneous Q24H  . fluticasone  1 spray Each Nare Daily  . gabapentin  300 mg Oral QHS  . glipiZIDE  5 mg Oral BID AC  . insulin aspart  0-20 Units Subcutaneous TID WC  . insulin aspart  0-5 Units Subcutaneous QHS  . insulin aspart  4 Units Subcutaneous TID WC  . ipratropium-albuterol  3 mL Nebulization Q6H  . methylPREDNISolone (SOLU-MEDROL) injection  40 mg Intravenous Q12H  . multivitamin with minerals  1 tablet Oral Daily  . simvastatin  20 mg Oral QHS   Continuous Infusions: . sodium chloride 100 mL/hr at 06/14/18 0702     LOS: 0 days    Time spent: 30 minutes    Elzia Hott Darleen Crocker, DO [facility] Pager [phone]  If 7PM-7AM, please contact night-coverage [email or web address] Password [email or web address] 06/14/2018, 1:24 PM

## 2018-06-14 NOTE — Progress Notes (Signed)
Patient's SPO2 checked while ambulating, per MD order. Patient's SPO2 remained at 95% and above on room air. Patient reported mild shortness of breath that is "better than before."   Celestia Khat, RN

## 2018-06-15 ENCOUNTER — Observation Stay (HOSPITAL_COMMUNITY): Payer: Self-pay

## 2018-06-15 DIAGNOSIS — E119 Type 2 diabetes mellitus without complications: Secondary | ICD-10-CM

## 2018-06-15 DIAGNOSIS — J441 Chronic obstructive pulmonary disease with (acute) exacerbation: Principal | ICD-10-CM

## 2018-06-15 LAB — CBC
HEMATOCRIT: 41.3 % (ref 36.0–46.0)
Hemoglobin: 13.5 g/dL (ref 12.0–15.0)
MCH: 31.9 pg (ref 26.0–34.0)
MCHC: 32.7 g/dL (ref 30.0–36.0)
MCV: 97.6 fL (ref 78.0–100.0)
Platelets: 404 10*3/uL — ABNORMAL HIGH (ref 150–400)
RBC: 4.23 MIL/uL (ref 3.87–5.11)
RDW: 12.4 % (ref 11.5–15.5)
WBC: 23.3 10*3/uL — ABNORMAL HIGH (ref 4.0–10.5)

## 2018-06-15 LAB — BASIC METABOLIC PANEL
Anion gap: 9 (ref 5–15)
BUN: 14 mg/dL (ref 6–20)
CO2: 24 mmol/L (ref 22–32)
CREATININE: 0.66 mg/dL (ref 0.44–1.00)
Calcium: 9.8 mg/dL (ref 8.9–10.3)
Chloride: 104 mmol/L (ref 98–111)
GFR calc non Af Amer: >60 mL/min (ref >60)
GLUCOSE: 338 mg/dL — AB (ref 70–99)
Potassium: 4.3 mmol/L (ref 3.5–5.1)
Sodium: 137 mmol/L (ref 135–145)

## 2018-06-15 LAB — GLUCOSE, CAPILLARY
GLUCOSE-CAPILLARY: 226 mg/dL — AB (ref 70–99)
Glucose-Capillary: 274 mg/dL — ABNORMAL HIGH (ref 70–99)

## 2018-06-15 MED ORDER — HYDROCODONE-ACETAMINOPHEN 5-325 MG PO TABS: 1.0000 | ORAL_TABLET | ORAL | 0 refills | Status: AC | PRN

## 2018-06-15 MED ORDER — AZITHROMYCIN 250 MG PO TABS: ORAL_TABLET | ORAL | 0 refills | Status: AC

## 2018-06-15 MED ORDER — IPRATROPIUM-ALBUTEROL 0.5-2.5 (3) MG/3ML IN SOLN
3.0000 mL | Freq: Four times a day (QID) | RESPIRATORY_TRACT | Status: DC
  Filled 2018-06-15: qty 3

## 2018-06-15 MED ORDER — METHYLPREDNISOLONE 4 MG PO TBPK: ORAL_TABLET | ORAL | 0 refills | Status: DC

## 2018-06-15 MED ORDER — ALBUTEROL SULFATE HFA 108 (90 BASE) MCG/ACT IN AERS: 2.0000 | INHALATION_SPRAY | Freq: Four times a day (QID) | RESPIRATORY_TRACT | 2 refills | Status: DC | PRN

## 2018-06-15 NOTE — Progress Notes (Signed)
Patient is to be discharged home and in stable condition. Patient's IV and telemetry removed, WNL. Patient given discharge instructions and verbalized understanding. Patient to be escorted out by staff via wheelchair.  Pattijo Juste P Dishmon, RN  

## 2018-06-15 NOTE — Discharge Summary (Signed)
Physician Discharge Summary  Michaela Aguilar IFO:[id number] DOB: [birth year] DOA: 06/13/2018  PCP: Soyla Dryer, PA-C  Admit date: 06/13/2018  Discharge date: 06/15/2018  Admitted From:Home  Disposition:  Home  Recommendations for Outpatient Follow-up:  1. Follow up with PCP in 1-2 weeks as scheduled on 8/20  Home Health:N/A  Equipment/Devices:N/A  Discharge Condition:Stable  CODE STATUS: Full  Diet recommendation: Heart Healthy/Carb Modified  Brief/Interim Summary:  Michaela A Leeis a 52 y.o.femalewith hx of NIDDM , kidney stones, depression and bronchitis presented to ED w/ SOB, also some CP w/ coughing. Per ED was treated as COPD exacerbation w/ IV steroids and nebulizers.  She continued to have steady improvement of her shortness of breath and chest tightness but developed a headache while here.  This was refractory to medical treatment with NSAIDs as well as narcotic pain medications for which a CT of the head was performed today to rule out any acute process.  There were no acute findings noted and patient was advised to continue taking ibuprofen over-the-counter as needed as well as Norco which has been prescribed if severity increases.  It appears that much of this may be related to some sinusitis and tension headache.  She had no findings of photophobia or nuchal rigidity or fever noted.  She has been discharged with steroid taper as well as Z-Pak and rescue inhaler as needed.  She has been counseled on smoking cessation and states that she will not smoke again upon returning home.  She has follow-up with her PCP on 8/20.    Discharge Diagnoses:  Active Problems:   Diabetes mellitus without complication (HCC)   Morbid obesity ([place])   Acute dyspnea   Wheezing   COPD with acute exacerbation (HCC)   Bronchitis due to tobacco use ([place])  1. Acute asthma versus COPD exacerbation-resolved.  Home rescue inhaler prescribed as needed with steroid taper pack.  Continue on  azithromycin pack as ordered. 2. Type 2 diabetes with hyperglycemia-steroid-induced.    Continue on home glipizide as previously prescribed.  Monitor blood glucose levels at home.  Patient will be on a fairly quick steroid taper. 3. Intractable headache.  Suspect occipital headache versus sinus related.    Head CT negative. 4. Tobacco abuse.  Cessation counseling provided. 5. Obesity.   Discharge Instructions  Discharge Instructions    Diet - low sodium heart healthy   Complete by:  As directed    Increase activity slowly   Complete by:  As directed      Allergies as of 06/15/2018   No Known Allergies     Medication List    TAKE these medications   acetaminophen 500 MG tablet Commonly known as:  TYLENOL Take 500 mg by mouth every 6 (six) hours as needed.   albuterol 108 (90 Base) MCG/ACT inhaler Commonly known as:  PROVENTIL HFA;VENTOLIN HFA Inhale 2 puffs into the lungs every 6 (six) hours as needed for wheezing or shortness of breath.   azithromycin 250 MG tablet Commonly known as:  ZITHROMAX Z-PAK Take 2 tablets (500 mg) on  Day 1,  followed by 1 tablet (250 mg) once daily on Days 2 through 5.   gabapentin 300 MG capsule Commonly known as:  NEURONTIN Take 1 capsule (300 mg total) by mouth at bedtime as needed.   glipiZIDE 5 MG tablet Commonly known as:  GLUCOTROL TAKE 1 TABLET BY MOUTH TWICE DAILY BEFORE  A  MEAL   HYDROcodone-acetaminophen 5-325 MG tablet Commonly known as:  NORCO/VICODIN Take  1-2 tablets by mouth every 4 (four) hours as needed for up to 3 days for moderate pain.   methylPREDNISolone 4 MG Tbpk tablet Commonly known as:  MEDROL DOSEPAK Per package instructions   multivitamin with minerals tablet Take 1 tablet by mouth daily.   simvastatin 20 MG tablet Commonly known as:  ZOCOR Take 1 tablet (20 mg total) by mouth at bedtime.   VALIUM PO Take 0.5 mg by mouth 2 (two) times daily as needed.      Follow-up Information    Soyla Dryer,  PA-C Follow up in 2 week(s).   Specialty:  Physician Assistant Contact information: [address] [place] [address] [phone]          No Known Allergies  Consultations:  None   Procedures/Studies: Dg Chest 2 View  Result Date: 06/13/2018 CLINICAL DATA:  Chest pain, shortness of breath EXAM: CHEST - 2 VIEW COMPARISON:  04/23/2016 FINDINGS: Lungs are clear.  No pleural effusion or pneumothorax. The heart is normal in size. Mild degenerative changes of the visualized thoracolumbar spine. IMPRESSION: Normal chest radiographs. Electronically Signed   By: Julian Hy M.D.   On: 06/13/2018 12:03   Ct Head Wo Contrast  Result Date: 06/15/2018 CLINICAL DATA:  Worst headache of life beginning 4 days ago. EXAM: CT HEAD WITHOUT CONTRAST TECHNIQUE: Contiguous axial images were obtained from the base of the skull through the vertex without intravenous contrast. COMPARISON:  Head CT and MRI 05/19/2015 FINDINGS: Brain: There is no evidence of acute infarct, intracranial hemorrhage, mass, midline shift, or extra-axial fluid collection. The ventricles and sulci are normal. Vascular: No hyperdense vessel. Skull: No fracture or focal osseous lesion. Sinuses/Orbits: Mild chronic right maxillary sinus mucosal thickening with a small amount of bubbly fluid present as well. Minimal left sphenoid sinus mucosal thickening. Clear mastoid air cells. Unremarkable orbits. Other: None. IMPRESSION: 1. Unremarkable CT appearance of the brain. 2. Mild chronic/possibly acute on chronic right maxillary sinusitis. Electronically Signed   By: Logan Bores M.D.   On: 06/15/2018 10:15    Discharge Exam: Vitals:   06/15/18 0530 06/15/18 0614  BP:  118/65  Pulse:  78  Resp:  18  Temp:  97.7 F (36.5 C)  SpO2: 95% 97%   Vitals:   06/14/18 2013 06/14/18 2105 06/15/18 0530 06/15/18 0614  BP:  125/63  118/65  Pulse:  62  78  Resp:  18  18  Temp:  97.8 F (36.6 C)  97.7 F (36.5 C)  TempSrc:  Oral   Oral  SpO2: 98% 98% 95% 97%  Weight:      Height:        General: Pt is alert, awake, not in acute distress Cardiovascular: RRR, S1/S2 +, no rubs, no gallops Respiratory: CTA bilaterally, no wheezing, no rhonchi Abdominal: Soft, NT, ND, bowel sounds + Extremities: no edema, no cyanosis    The results of significant diagnostics from this hospitalization (including imaging, microbiology, ancillary and laboratory) are listed below for reference.     Microbiology: No results found for this or any previous visit (from the past 240 hour(s)).   Labs: BNP (last 3 results) No results for input(s): BNP in the last 8760 hours. Basic Metabolic Panel: Recent Labs  Lab 06/13/18 1145 06/15/18 0545  NA 139 137  K 4.0 4.3  CL 104 104  CO2 25 24  GLUCOSE 146* 338*  BUN 11 14  CREATININE 0.66 0.66  CALCIUM 9.8 9.8   Liver Function Tests: No  results for input(s): AST, ALT, ALKPHOS, BILITOT, PROT, ALBUMIN in the last 168 hours. No results for input(s): LIPASE, AMYLASE in the last 168 hours. No results for input(s): AMMONIA in the last 168 hours. CBC: Recent Labs  Lab 06/13/18 1145 06/15/18 0545  WBC 15.7* 23.3*  HGB 14.6 13.5  HCT 42.3 41.3  MCV 95.9 97.6  PLT 359 404*   Cardiac Enzymes: Recent Labs  Lab 06/13/18 1145  TROPONINI <0.03   BNP: Invalid input(s): POCBNP CBG: Recent Labs  Lab 06/14/18 0814 06/14/18 1127 06/14/18 1623 06/14/18 2104 06/15/18 0813  GLUCAP 244* 334* 312* 269* 274*   D-Dimer No results for input(s): DDIMER in the last 72 hours. Hgb A1c No results for input(s): HGBA1C in the last 72 hours. Lipid Profile No results for input(s): CHOL, HDL, LDLCALC, TRIG, CHOLHDL, LDLDIRECT in the last 72 hours. Thyroid function studies No results for input(s): TSH, T4TOTAL, T3FREE, THYROIDAB in the last 72 hours.  Invalid input(s): FREET3 Anemia work up No results for input(s): VITAMINB12, FOLATE, FERRITIN, TIBC, IRON, RETICCTPCT in the last 72  hours. Urinalysis    Component Value Date/Time   COLORURINE YELLOW 03/18/2018 1130   APPEARANCEUR CLEAR 03/18/2018 1130   LABSPEC 1.014 03/18/2018 1130   PHURINE 6.0 03/18/2018 1130   GLUCOSEU NEGATIVE 03/18/2018 1130   HGBUR MODERATE (A) 03/18/2018 1130   BILIRUBINUR NEGATIVE 03/18/2018 1130   KETONESUR NEGATIVE 03/18/2018 1130   PROTEINUR NEGATIVE 03/18/2018 1130   UROBILINOGEN 0.2 08/28/2015 2130   NITRITE NEGATIVE 03/18/2018 1130   LEUKOCYTESUR NEGATIVE 03/18/2018 1130   Sepsis Labs Invalid input(s): PROCALCITONIN,  WBC,  LACTICIDVEN Microbiology No results found for this or any previous visit (from the past 240 hour(s)).   Time coordinating discharge: 40 minutes  SIGNED:   Rodena Goldmann, DO [facility] 06/15/2018, 10:43 AM Pager [phone]  If 7PM-7AM, please contact night-coverage [email or web address] Password [email or web address]

## 2018-06-26 ENCOUNTER — Ambulatory Visit: Payer: Self-pay | Admitting: Physician Assistant

## 2018-06-28 ENCOUNTER — Other Ambulatory Visit (HOSPITAL_COMMUNITY)
Admission: RE | Admit: 2018-06-28 | Discharge: 2018-06-28 | Disposition: A | Discharge status: Home / Self Care | Payer: Medicaid Other | Source: Ambulatory Visit | Attending: Physician Assistant | Admitting: Physician Assistant

## 2018-06-28 DIAGNOSIS — E119 Type 2 diabetes mellitus without complications: Secondary | ICD-10-CM

## 2018-06-28 DIAGNOSIS — E785 Hyperlipidemia, unspecified: Secondary | ICD-10-CM | POA: Insufficient documentation

## 2018-06-28 LAB — COMPREHENSIVE METABOLIC PANEL
ALK PHOS: 75 U/L (ref 38–126)
ALT: 29 U/L (ref 0–44)
AST: 20 U/L (ref 15–41)
Albumin: 3.7 g/dL (ref 3.5–5.0)
Anion gap: 7 (ref 5–15)
BILIRUBIN TOTAL: 1.2 mg/dL (ref 0.3–1.2)
BUN: 10 mg/dL (ref 6–20)
CALCIUM: 9.6 mg/dL (ref 8.9–10.3)
CO2: 25 mmol/L (ref 22–32)
Chloride: 107 mmol/L (ref 98–111)
Creatinine, Ser: 0.63 mg/dL (ref 0.44–1.00)
GFR calc non Af Amer: >60 mL/min (ref >60)
Glucose, Bld: 148 mg/dL — ABNORMAL HIGH (ref 70–99)
Potassium: 4.2 mmol/L (ref 3.5–5.1)
Sodium: 139 mmol/L (ref 135–145)
TOTAL PROTEIN: 6.7 g/dL (ref 6.5–8.1)

## 2018-06-28 LAB — HEMOGLOBIN A1C
HEMOGLOBIN A1C: 6.9 % — AB (ref 4.8–5.6)
Mean Plasma Glucose: 151.33 mg/dL

## 2018-06-28 LAB — LIPID PANEL
CHOLESTEROL: 210 mg/dL — AB (ref 0–200)
HDL: 43 mg/dL (ref >40)
LDL Cholesterol: 134 mg/dL — ABNORMAL HIGH (ref 0–99)
TRIGLYCERIDES: 167 mg/dL — AB (ref <150)
Total CHOL/HDL Ratio: 4.9 RATIO
VLDL: 33 mg/dL (ref 0–40)

## 2018-07-02 ENCOUNTER — Other Ambulatory Visit: Payer: Self-pay | Admitting: Physician Assistant

## 2018-07-02 ENCOUNTER — Encounter: Payer: Self-pay | Admitting: Physician Assistant

## 2018-07-02 ENCOUNTER — Ambulatory Visit: Payer: Medicaid Other | Admitting: Physician Assistant

## 2018-07-02 VITALS — BP 110/80 | HR 71 | Temp 97.5°F | Ht 65.0 in | Wt 241.5 lb

## 2018-07-02 DIAGNOSIS — Z1211 Encounter for screening for malignant neoplasm of colon: Secondary | ICD-10-CM

## 2018-07-02 DIAGNOSIS — E785 Hyperlipidemia, unspecified: Secondary | ICD-10-CM

## 2018-07-02 DIAGNOSIS — F39 Unspecified mood [affective] disorder: Secondary | ICD-10-CM

## 2018-07-02 DIAGNOSIS — G8929 Other chronic pain: Secondary | ICD-10-CM

## 2018-07-02 DIAGNOSIS — F17211 Nicotine dependence, cigarettes, in remission: Secondary | ICD-10-CM

## 2018-07-02 DIAGNOSIS — E119 Type 2 diabetes mellitus without complications: Secondary | ICD-10-CM

## 2018-07-02 MED ORDER — ATORVASTATIN CALCIUM 20 MG PO TABS: 20.0000 mg | ORAL_TABLET | Freq: Every day | ORAL | 4 refills | Status: DC

## 2018-07-02 NOTE — Progress Notes (Signed)
BP 110/80 (BP Location: Left Arm, Patient Position: Sitting, Cuff Size: Normal)   Pulse 71   Temp (!) 97.5 F (36.4 C)   Ht 5\' 5"  (1.651 m)   Wt 241 lb 8 oz (109.5 kg)   LMP 02/16/2014   SpO2 94%   BMI 40.19 kg/m    Subjective:    Patient ID: Michaela Aguilar, female    DOB: [birth year], 52 y.o.   MRN: [id number]  HPI: Michaela Aguilar is a 52 y.o. female presenting on 07/02/2018 for Hyperlipidemia (pt has not taken simvastatin in about a month due to it causing stomach problem) and Diabetes   HPI   Chief Complaint  Patient presents with  . Hyperlipidemia    pt has not taken simvastatin in about a month due to it causing stomach problem  . Diabetes    Pt is still going to [facility] for [place] issues.    She says she is doing well and has not smoked since first of the month after having to be in hospital for COPD exacerbation.  Relevant past medical, surgical, family and social history reviewed and updated as indicated. Interim medical history since our last visit reviewed. Allergies and medications reviewed and updated.   Current Outpatient Medications:  .  acetaminophen (TYLENOL) 500 MG tablet, Take 500 mg by mouth every 6 (six) hours as needed., Disp: , Rfl:  .  albuterol (PROVENTIL HFA;VENTOLIN HFA) 108 (90 Base) MCG/ACT inhaler, Inhale 2 puffs into the lungs every 6 (six) hours as needed for wheezing or shortness of breath., Disp: 1 Inhaler, Rfl: 2 .  gabapentin (NEURONTIN) 300 MG capsule, Take 1 capsule (300 mg total) by mouth at bedtime as needed., Disp: 60 capsule, Rfl: 1 .  glipiZIDE (GLUCOTROL) 5 MG tablet, TAKE 1 TABLET BY MOUTH TWICE DAILY BEFORE  A  MEAL, Disp: 60 tablet, Rfl: 3 .  Multiple Vitamins-Minerals (MULTIVITAMIN WITH MINERALS) tablet, Take 1 tablet by mouth daily., Disp: , Rfl:  .  simvastatin (ZOCOR) 20 MG tablet, Take 1 tablet (20 mg total) by mouth at bedtime. (Patient not taking: Reported on 07/02/2018), Disp: 30 tablet, Rfl: 4   Review of Systems   Constitutional: Negative for appetite change, chills, diaphoresis, fatigue, fever and unexpected weight change.  HENT: Negative for congestion, drooling, ear pain, facial swelling, hearing loss, mouth sores, sneezing, sore throat, trouble swallowing and voice change.   Eyes: Negative for pain, discharge, redness, itching and visual disturbance.  Respiratory: Negative for cough, choking, shortness of breath and wheezing.   Cardiovascular: Positive for leg swelling. Negative for chest pain and palpitations.  Gastrointestinal: Negative for abdominal pain, blood in stool, constipation, diarrhea and vomiting.  Endocrine: Negative for cold intolerance, heat intolerance and polydipsia.  Genitourinary: Negative for decreased urine volume, dysuria and hematuria.  Musculoskeletal: Positive for arthralgias. Negative for back pain and gait problem.  Skin: Negative for rash.  Allergic/Immunologic: Negative for environmental allergies.  Neurological: Negative for seizures, syncope, light-headedness and headaches.  Hematological: Negative for adenopathy.  Psychiatric/Behavioral: Positive for dysphoric mood. Negative for agitation and suicidal ideas. The patient is nervous/anxious.     Per HPI unless specifically indicated above     Objective:    BP 110/80 (BP Location: Left Arm, Patient Position: Sitting, Cuff Size: Normal)   Pulse 71   Temp (!) 97.5 F (36.4 C)   Ht 5\' 5"  (1.651 m)   Wt 241 lb 8 oz (109.5 kg)   LMP 02/16/2014   SpO2 94%  BMI 40.19 kg/m   Wt Readings from Last 3 Encounters:  07/02/18 241 lb 8 oz (109.5 kg)  06/13/18 237 lb 3.4 oz (107.6 kg)  03/27/18 241 lb 4 oz (109.4 kg)    Physical Exam  Constitutional: She is oriented to person, place, and time. She appears well-developed and well-nourished.  HENT:  Head: Normocephalic and atraumatic.  Neck: Neck supple.  Cardiovascular: Normal rate and regular rhythm.  Pulmonary/Chest: Effort normal and breath sounds normal.   Abdominal: Soft. Bowel sounds are normal. She exhibits no mass. There is no hepatosplenomegaly. There is no tenderness.  Musculoskeletal: She exhibits no edema.  Lymphadenopathy:    She has no cervical adenopathy.  Neurological: She is alert and oriented to person, place, and time.  Skin: Skin is warm and dry.  Psychiatric: She has a normal mood and affect. Her behavior is normal.  Vitals reviewed.   Results for orders placed or performed during the hospital encounter of 06/28/18  Hemoglobin A1c  Result Value Ref Range   Hgb A1c MFr Bld 6.9 (H) 4.8 - 5.6 %   Mean Plasma Glucose 151.33 mg/dL  Comprehensive metabolic panel  Result Value Ref Range   Sodium 139 135 - 145 mmol/L   Potassium 4.2 3.5 - 5.1 mmol/L   Chloride 107 98 - 111 mmol/L   CO2 25 22 - 32 mmol/L   Glucose, Bld 148 (H) 70 - 99 mg/dL   BUN 10 6 - 20 mg/dL   Creatinine, Ser 0.63 0.44 - 1.00 mg/dL   Calcium 9.6 8.9 - 10.3 mg/dL   Total Protein 6.7 6.5 - 8.1 g/dL   Albumin 3.7 3.5 - 5.0 g/dL   AST 20 15 - 41 U/L   ALT 29 0 - 44 U/L   Alkaline Phosphatase 75 38 - 126 U/L   Total Bilirubin 1.2 0.3 - 1.2 mg/dL   GFR calc non Af Amer >60 >60 mL/min   GFR calc Af Amer >60 >60 mL/min   Anion gap 7 5 - 15  Lipid panel  Result Value Ref Range   Cholesterol 210 (H) 0 - 200 mg/dL   Triglycerides 167 (H) <150 mg/dL   HDL 43 >40 mg/dL   Total CHOL/HDL Ratio 4.9 RATIO   VLDL 33 0 - 40 mg/dL   LDL Cholesterol 134 (H) 0 - 99 mg/dL      Assessment & Plan:    Encounter Diagnoses  Name Primary?  . Controlled type 2 diabetes mellitus without complication, without long-term current use of insulin ([place]) Yes  . Hyperlipidemia, unspecified hyperlipidemia type   . Mood disorder ([place])   . Cigarette nicotine dependence in remission   . Other chronic pain   . Morbid obesity ([place])   . Screening for colon cancer     -reviewed labs with pt -rx atorvastatin.  Continue lowfat diet -gave iFOBT for colon cancer screening  test -congratulated pt on efforts to stop smoking.  Encouraged pt to continue to stay off the cigarettes -pt to follow up in 3 months.  RTO sooner prn

## 2018-09-25 ENCOUNTER — Other Ambulatory Visit: Payer: Self-pay | Admitting: Urology

## 2018-09-25 ENCOUNTER — Ambulatory Visit: Payer: Self-pay | Admitting: Urology

## 2018-09-25 DIAGNOSIS — N200 Calculus of kidney: Secondary | ICD-10-CM

## 2018-09-30 ENCOUNTER — Ambulatory Visit (HOSPITAL_COMMUNITY)
Admission: RE | Admit: 2018-09-30 | Discharge: 2018-09-30 | Disposition: A | Discharge status: Home / Self Care | Payer: Self-pay | Source: Ambulatory Visit | Attending: Urology | Admitting: Urology

## 2018-09-30 DIAGNOSIS — N200 Calculus of kidney: Secondary | ICD-10-CM

## 2018-10-01 ENCOUNTER — Other Ambulatory Visit (HOSPITAL_COMMUNITY)
Admission: RE | Admit: 2018-10-01 | Discharge: 2018-10-01 | Disposition: A | Discharge status: Home / Self Care | Payer: Medicaid Other | Source: Ambulatory Visit | Attending: Physician Assistant | Admitting: Physician Assistant

## 2018-10-01 DIAGNOSIS — E785 Hyperlipidemia, unspecified: Secondary | ICD-10-CM

## 2018-10-01 DIAGNOSIS — E119 Type 2 diabetes mellitus without complications: Secondary | ICD-10-CM | POA: Insufficient documentation

## 2018-10-01 LAB — HEMOGLOBIN A1C
Hgb A1c MFr Bld: 6.2 % — ABNORMAL HIGH (ref 4.8–5.6)
Mean Plasma Glucose: 131.24 mg/dL

## 2018-10-01 LAB — COMPREHENSIVE METABOLIC PANEL
ALBUMIN: 4.2 g/dL (ref 3.5–5.0)
ALT: 22 U/L (ref 0–44)
ANION GAP: 7 (ref 5–15)
AST: 17 U/L (ref 15–41)
Alkaline Phosphatase: 85 U/L (ref 38–126)
BILIRUBIN TOTAL: 0.7 mg/dL (ref 0.3–1.2)
BUN: 11 mg/dL (ref 6–20)
CHLORIDE: 107 mmol/L (ref 98–111)
CO2: 26 mmol/L (ref 22–32)
Calcium: 9.9 mg/dL (ref 8.9–10.3)
Creatinine, Ser: 0.54 mg/dL (ref 0.44–1.00)
GFR calc Af Amer: >60 mL/min (ref >60)
GFR calc non Af Amer: >60 mL/min (ref >60)
GLUCOSE: 145 mg/dL — AB (ref 70–99)
POTASSIUM: 4 mmol/L (ref 3.5–5.1)
SODIUM: 140 mmol/L (ref 135–145)
TOTAL PROTEIN: 7.4 g/dL (ref 6.5–8.1)

## 2018-10-01 LAB — LIPID PANEL
CHOL/HDL RATIO: 4.3 ratio
Cholesterol: 215 mg/dL — ABNORMAL HIGH (ref 0–200)
HDL: 50 mg/dL (ref >40)
LDL CALC: 131 mg/dL — AB (ref 0–99)
Triglycerides: 172 mg/dL — ABNORMAL HIGH (ref <150)
VLDL: 34 mg/dL (ref 0–40)

## 2018-10-02 ENCOUNTER — Ambulatory Visit: Payer: Medicaid Other | Admitting: Physician Assistant

## 2018-10-02 ENCOUNTER — Encounter: Payer: Self-pay | Admitting: Physician Assistant

## 2018-10-02 ENCOUNTER — Ambulatory Visit (INDEPENDENT_AMBULATORY_CARE_PROVIDER_SITE_OTHER): Payer: Self-pay | Admitting: Urology

## 2018-10-02 VITALS — BP 130/76 | HR 79 | Temp 97.9°F | Wt 239.8 lb

## 2018-10-02 DIAGNOSIS — E669 Obesity, unspecified: Secondary | ICD-10-CM

## 2018-10-02 DIAGNOSIS — E119 Type 2 diabetes mellitus without complications: Secondary | ICD-10-CM

## 2018-10-02 DIAGNOSIS — Z1239 Encounter for other screening for malignant neoplasm of breast: Secondary | ICD-10-CM

## 2018-10-02 DIAGNOSIS — M549 Dorsalgia, unspecified: Secondary | ICD-10-CM

## 2018-10-02 DIAGNOSIS — E785 Hyperlipidemia, unspecified: Secondary | ICD-10-CM

## 2018-10-02 DIAGNOSIS — N200 Calculus of kidney: Secondary | ICD-10-CM

## 2018-10-02 DIAGNOSIS — F17200 Nicotine dependence, unspecified, uncomplicated: Secondary | ICD-10-CM

## 2018-10-02 DIAGNOSIS — J449 Chronic obstructive pulmonary disease, unspecified: Secondary | ICD-10-CM

## 2018-10-02 MED ORDER — ATORVASTATIN CALCIUM 40 MG PO TABS: 40.0000 mg | ORAL_TABLET | Freq: Every day | ORAL | 2 refills | Status: DC

## 2018-10-02 MED ORDER — ALBUTEROL SULFATE HFA 108 (90 BASE) MCG/ACT IN AERS: 2.0000 | INHALATION_SPRAY | Freq: Four times a day (QID) | RESPIRATORY_TRACT | 1 refills | Status: DC | PRN

## 2018-10-02 NOTE — Progress Notes (Signed)
BP 130/76   Pulse 79   Temp 97.9 F (36.6 C)   Wt 239 lb 12 oz (108.7 kg)   LMP 02/16/2014   SpO2 95%   BMI 39.90 kg/m    Subjective:    Patient ID: Michaela Aguilar, female    DOB: [birth year], 52 y.o.   MRN: [id number]  HPI: Michaela Aguilar is a 52 y.o. female presenting on 10/02/2018 for Diabetes and Hyperlipidemia   HPI   Pt is seeing urology for kidney stones  She is only needing her inhaler once every few weeks.  She is still smoking.   Relevant past medical, surgical, family and social history reviewed and updated as indicated. Interim medical history since our last visit reviewed. Allergies and medications reviewed and updated.   Current Outpatient Medications:  .  acetaminophen (TYLENOL) 500 MG tablet, Take 500 mg by mouth every 6 (six) hours as needed., Disp: , Rfl:  .  albuterol (PROVENTIL HFA;VENTOLIN HFA) 108 (90 Base) MCG/ACT inhaler, Inhale 2 puffs into the lungs every 6 (six) hours as needed for wheezing or shortness of breath., Disp: 1 Inhaler, Rfl: 2 .  atorvastatin (LIPITOR) 20 MG tablet, Take 1 tablet (20 mg total) by mouth daily., Disp: 30 tablet, Rfl: 4 .  gabapentin (NEURONTIN) 300 MG capsule, Take 1 capsule (300 mg total) by mouth at bedtime as needed., Disp: 60 capsule, Rfl: 1 .  glipiZIDE (GLUCOTROL) 5 MG tablet, TAKE 1 TABLET BY MOUTH TWICE DAILY BEFORE  A  MEAL, Disp: 60 tablet, Rfl: 3 .  Multiple Vitamins-Minerals (MULTIVITAMIN WITH MINERALS) tablet, Take 1 tablet by mouth daily., Disp: , Rfl:   Review of Systems  Constitutional: Negative for appetite change, chills, diaphoresis, fatigue, fever and unexpected weight change.  HENT: Negative for congestion, dental problem, drooling, ear pain, facial swelling, hearing loss, mouth sores, sneezing, sore throat, trouble swallowing and voice change.   Eyes: Negative for pain, discharge, redness, itching and visual disturbance.  Respiratory: Negative for cough, choking, shortness of breath and wheezing.    Cardiovascular: Positive for palpitations. Negative for chest pain and leg swelling.  Gastrointestinal: Negative for abdominal pain, blood in stool, constipation, diarrhea and vomiting.  Endocrine: Negative for cold intolerance, heat intolerance and polydipsia.  Genitourinary: Negative for decreased urine volume, dysuria and hematuria.  Musculoskeletal: Negative for arthralgias, back pain and gait problem.  Skin: Negative for rash.  Allergic/Immunologic: Negative for environmental allergies.  Neurological: Positive for headaches. Negative for seizures, syncope and light-headedness.  Hematological: Negative for adenopathy.  Psychiatric/Behavioral: Positive for dysphoric mood. Negative for agitation and suicidal ideas. The patient is nervous/anxious.     Per HPI unless specifically indicated above     Objective:    BP 130/76   Pulse 79   Temp 97.9 F (36.6 C)   Wt 239 lb 12 oz (108.7 kg)   LMP 02/16/2014   SpO2 95%   BMI 39.90 kg/m   Wt Readings from Last 3 Encounters:  10/02/18 239 lb 12 oz (108.7 kg)  07/02/18 241 lb 8 oz (109.5 kg)  06/13/18 237 lb 3.4 oz (107.6 kg)    Physical Exam  Constitutional: She is oriented to person, place, and time. She appears well-developed and well-nourished.  HENT:  Head: Normocephalic and atraumatic.  Neck: Neck supple.  Cardiovascular: Normal rate and regular rhythm.  Pulmonary/Chest: Effort normal and breath sounds normal.  Abdominal: Soft. Bowel sounds are normal. She exhibits no mass. There is no hepatosplenomegaly. There is no tenderness.  Musculoskeletal:  She exhibits no edema.  Lymphadenopathy:    She has no cervical adenopathy.  Neurological: She is alert and oriented to person, place, and time.  Skin: Skin is warm and dry.  Psychiatric: She has a normal mood and affect. Her behavior is normal.  Vitals reviewed.   Results for orders placed or performed during the hospital encounter of 10/01/18  Hemoglobin A1c  Result Value  Ref Range   Hgb A1c MFr Bld 6.2 (H) 4.8 - 5.6 %   Mean Plasma Glucose 131.24 mg/dL  Lipid panel  Result Value Ref Range   Cholesterol 215 (H) 0 - 200 mg/dL   Triglycerides 172 (H) <150 mg/dL   HDL 50 >40 mg/dL   Total CHOL/HDL Ratio 4.3 RATIO   VLDL 34 0 - 40 mg/dL   LDL Cholesterol 131 (H) 0 - 99 mg/dL  Comprehensive metabolic panel  Result Value Ref Range   Sodium 140 135 - 145 mmol/L   Potassium 4.0 3.5 - 5.1 mmol/L   Chloride 107 98 - 111 mmol/L   CO2 26 22 - 32 mmol/L   Glucose, Bld 145 (H) 70 - 99 mg/dL   BUN 11 6 - 20 mg/dL   Creatinine, Ser 0.54 0.44 - 1.00 mg/dL   Calcium 9.9 8.9 - 10.3 mg/dL   Total Protein 7.4 6.5 - 8.1 g/dL   Albumin 4.2 3.5 - 5.0 g/dL   AST 17 15 - 41 U/L   ALT 22 0 - 44 U/L   Alkaline Phosphatase 85 38 - 126 U/L   Total Bilirubin 0.7 0.3 - 1.2 mg/dL   GFR calc non Af Amer >60 >60 mL/min   GFR calc Af Amer >60 >60 mL/min   Anion gap 7 5 - 15      Assessment & Plan:   Encounter Diagnoses  Name Primary?  . Controlled type 2 diabetes mellitus without complication, without long-term current use of insulin ([place]) Yes  . Hyperlipidemia, unspecified hyperlipidemia type   . Chronic obstructive pulmonary disease, unspecified COPD type ([place])   . Tobacco use disorder   . Obesity, unspecified classification, unspecified obesity type, unspecified whether serious comorbidity present   . Screening for breast cancer     -Reviewed labs iwht pt -will Increase atorvastatin to 40mg .  Continue to follow lowfat diet -continue other meds -ordered Screening mammogram -pt reminded to return iFOBT for colon cancer screening -counseled smoking cessation -pt to follow up 3 months.  RTO sooner prn

## 2018-11-21 ENCOUNTER — Ambulatory Visit (HOSPITAL_COMMUNITY)
Admission: RE | Admit: 2018-11-21 | Discharge: 2018-11-21 | Disposition: A | Discharge status: Home / Self Care | Payer: Medicaid Other | Source: Ambulatory Visit | Attending: Urology | Admitting: Urology

## 2018-11-21 ENCOUNTER — Other Ambulatory Visit (HOSPITAL_COMMUNITY): Payer: Self-pay | Admitting: Urology

## 2018-11-21 DIAGNOSIS — N200 Calculus of kidney: Secondary | ICD-10-CM | POA: Insufficient documentation

## 2018-11-26 ENCOUNTER — Other Ambulatory Visit (HOSPITAL_COMMUNITY): Payer: Self-pay | Admitting: *Deleted

## 2018-11-26 DIAGNOSIS — Z1231 Encounter for screening mammogram for malignant neoplasm of breast: Secondary | ICD-10-CM

## 2018-11-27 ENCOUNTER — Ambulatory Visit (INDEPENDENT_AMBULATORY_CARE_PROVIDER_SITE_OTHER): Payer: Self-pay | Admitting: Urology

## 2018-11-27 DIAGNOSIS — N200 Calculus of kidney: Secondary | ICD-10-CM

## 2018-12-11 ENCOUNTER — Ambulatory Visit (INDEPENDENT_AMBULATORY_CARE_PROVIDER_SITE_OTHER): Payer: Self-pay | Admitting: Urology

## 2018-12-11 ENCOUNTER — Ambulatory Visit (HOSPITAL_COMMUNITY)
Admission: RE | Admit: 2018-12-11 | Discharge: 2018-12-11 | Disposition: A | Discharge status: Home / Self Care | Payer: Self-pay | Source: Ambulatory Visit | Attending: Urology | Admitting: Urology

## 2018-12-11 ENCOUNTER — Other Ambulatory Visit (HOSPITAL_COMMUNITY): Payer: Self-pay | Admitting: Urology

## 2018-12-11 DIAGNOSIS — N200 Calculus of kidney: Secondary | ICD-10-CM

## 2019-01-01 ENCOUNTER — Other Ambulatory Visit (HOSPITAL_COMMUNITY)
Admission: RE | Admit: 2019-01-01 | Discharge: 2019-01-01 | Disposition: A | Discharge status: Home / Self Care | Payer: Medicaid Other | Source: Ambulatory Visit | Attending: Physician Assistant | Admitting: Physician Assistant

## 2019-01-01 DIAGNOSIS — E119 Type 2 diabetes mellitus without complications: Secondary | ICD-10-CM | POA: Diagnosis not present

## 2019-01-01 DIAGNOSIS — E785 Hyperlipidemia, unspecified: Secondary | ICD-10-CM | POA: Insufficient documentation

## 2019-01-01 DIAGNOSIS — R69 Illness, unspecified: Secondary | ICD-10-CM | POA: Diagnosis present

## 2019-01-01 LAB — COMPREHENSIVE METABOLIC PANEL
ALT: 27 U/L (ref 0–44)
AST: 19 U/L (ref 15–41)
Albumin: 4.1 g/dL (ref 3.5–5.0)
Alkaline Phosphatase: 82 U/L (ref 38–126)
Anion gap: 8 (ref 5–15)
BUN: 14 mg/dL (ref 6–20)
CO2: 26 mmol/L (ref 22–32)
Calcium: 9.8 mg/dL (ref 8.9–10.3)
Chloride: 103 mmol/L (ref 98–111)
Creatinine, Ser: 0.64 mg/dL (ref 0.44–1.00)
GFR calc Af Amer: >60 mL/min (ref >60)
GFR calc non Af Amer: >60 mL/min (ref >60)
Glucose, Bld: 163 mg/dL — ABNORMAL HIGH (ref 70–99)
POTASSIUM: 4.2 mmol/L (ref 3.5–5.1)
Sodium: 137 mmol/L (ref 135–145)
Total Bilirubin: 0.6 mg/dL (ref 0.3–1.2)
Total Protein: 7.2 g/dL (ref 6.5–8.1)

## 2019-01-01 LAB — LIPID PANEL
Cholesterol: 205 mg/dL — ABNORMAL HIGH (ref 0–200)
HDL: 52 mg/dL (ref >40)
LDL Cholesterol: 126 mg/dL — ABNORMAL HIGH (ref 0–99)
Total CHOL/HDL Ratio: 3.9 RATIO
Triglycerides: 133 mg/dL (ref <150)
VLDL: 27 mg/dL (ref 0–40)

## 2019-01-02 ENCOUNTER — Encounter: Payer: Self-pay | Admitting: Physician Assistant

## 2019-01-02 ENCOUNTER — Ambulatory Visit: Payer: Medicaid Other | Admitting: Physician Assistant

## 2019-01-02 VITALS — BP 114/70 | HR 76 | Temp 97.7°F | Ht 65.0 in | Wt 242.0 lb

## 2019-01-02 DIAGNOSIS — E785 Hyperlipidemia, unspecified: Secondary | ICD-10-CM

## 2019-01-02 DIAGNOSIS — E119 Type 2 diabetes mellitus without complications: Secondary | ICD-10-CM

## 2019-01-02 DIAGNOSIS — F17200 Nicotine dependence, unspecified, uncomplicated: Secondary | ICD-10-CM

## 2019-01-02 DIAGNOSIS — J449 Chronic obstructive pulmonary disease, unspecified: Secondary | ICD-10-CM

## 2019-01-02 LAB — HEMOGLOBIN A1C
Hgb A1c MFr Bld: 7 % — ABNORMAL HIGH (ref 4.8–5.6)
Mean Plasma Glucose: 154 mg/dL

## 2019-01-02 NOTE — Progress Notes (Signed)
BP 114/70   Pulse 76   Temp 97.7 F (36.5 C)   Ht 5\' 5"  (1.651 m)   Wt 242 lb (109.8 kg)   LMP 02/16/2014   SpO2 97%   BMI 40.27 kg/m    Subjective:    Patient ID: Michaela Aguilar, female    DOB: 1965-11-18, 53 y.o.   MRN: [id number]  HPI: Michaela Aguilar is a 53 y.o. female presenting on 01/02/2019 for Hyperlipidemia and Diabetes   HPI   Pt says lipitor makes her nauseous.   Otherwise she is doing well and has no complaints.   Relevant past medical, surgical, family and social history reviewed and updated as indicated. Interim medical history since our last visit reviewed. Allergies and medications reviewed and updated.   Current Outpatient Medications:  .  acetaminophen (TYLENOL) 500 MG tablet, Take 500 mg by mouth every 6 (six) hours as needed., Disp: , Rfl:  .  albuterol (PROVENTIL HFA;VENTOLIN HFA) 108 (90 Base) MCG/ACT inhaler, Inhale 2 puffs into the lungs every 6 (six) hours as needed for wheezing or shortness of breath., Disp: 3 Inhaler, Rfl: 1 .  atorvastatin (LIPITOR) 40 MG tablet, Take 1 tablet (40 mg total) by mouth daily., Disp: 90 tablet, Rfl: 2 .  gabapentin (NEURONTIN) 300 MG capsule, Take 1 capsule (300 mg total) by mouth at bedtime as needed., Disp: 60 capsule, Rfl: 1 .  glipiZIDE (GLUCOTROL) 5 MG tablet, TAKE 1 TABLET BY MOUTH TWICE DAILY BEFORE  A  MEAL, Disp: 60 tablet, Rfl: 3 .  Multiple Vitamins-Minerals (MULTIVITAMIN WITH MINERALS) tablet, Take 1 tablet by mouth daily., Disp: , Rfl:    Review of Systems  Constitutional: Negative for appetite change, chills, diaphoresis, fatigue, fever and unexpected weight change.  HENT: Negative for congestion, dental problem, drooling, ear pain, facial swelling, hearing loss, mouth sores, sneezing, sore throat, trouble swallowing and voice change.   Eyes: Negative for pain, discharge, redness, itching and visual disturbance.  Respiratory: Negative for cough, choking, shortness of breath and wheezing.   Cardiovascular:  Negative for chest pain, palpitations and leg swelling.  Gastrointestinal: Negative for abdominal pain, blood in stool, constipation, diarrhea and vomiting.  Endocrine: Negative for cold intolerance, heat intolerance and polydipsia.  Genitourinary: Negative for decreased urine volume, dysuria and hematuria.  Musculoskeletal: Negative for arthralgias, back pain and gait problem.  Skin: Negative for rash.  Allergic/Immunologic: Negative for environmental allergies.  Neurological: Negative for seizures, syncope, light-headedness and headaches.  Hematological: Negative for adenopathy.  Psychiatric/Behavioral: Negative for agitation, dysphoric mood and suicidal ideas. The patient is not nervous/anxious.     Per HPI unless specifically indicated above     Objective:    BP 114/70   Pulse 76   Temp 97.7 F (36.5 C)   Ht 5\' 5"  (1.651 m)   Wt 242 lb (109.8 kg)   LMP 02/16/2014   SpO2 97%   BMI 40.27 kg/m   Wt Readings from Last 3 Encounters:  01/02/19 242 lb (109.8 kg)  10/02/18 239 lb 12 oz (108.7 kg)  07/02/18 241 lb 8 oz (109.5 kg)    Physical Exam Vitals signs reviewed.  Constitutional:      Appearance: She is well-developed.  HENT:     Head: Normocephalic and atraumatic.  Neck:     Musculoskeletal: Neck supple.  Cardiovascular:     Rate and Rhythm: Normal rate and regular rhythm.  Pulmonary:     Effort: Pulmonary effort is normal.     Breath sounds:  Normal breath sounds.  Abdominal:     General: Bowel sounds are normal.     Palpations: Abdomen is soft. There is no mass.     Tenderness: There is no abdominal tenderness.  Lymphadenopathy:     Cervical: No cervical adenopathy.  Skin:    General: Skin is warm and dry.  Neurological:     Mental Status: She is alert and oriented to person, place, and time.  Psychiatric:        Behavior: Behavior normal.     Results for orders placed or performed during the hospital encounter of 01/01/19  Lipid panel  Result Value  Ref Range   Cholesterol 205 (H) 0 - 200 mg/dL   Triglycerides 133 <150 mg/dL   HDL 52 >40 mg/dL   Total CHOL/HDL Ratio 3.9 RATIO   VLDL 27 0 - 40 mg/dL   LDL Cholesterol 126 (H) 0 - 99 mg/dL  Comprehensive metabolic panel  Result Value Ref Range   Sodium 137 135 - 145 mmol/L   Potassium 4.2 3.5 - 5.1 mmol/L   Chloride 103 98 - 111 mmol/L   CO2 26 22 - 32 mmol/L   Glucose, Bld 163 (H) 70 - 99 mg/dL   BUN 14 6 - 20 mg/dL   Creatinine, Ser 0.64 0.44 - 1.00 mg/dL   Calcium 9.8 8.9 - 10.3 mg/dL   Total Protein 7.2 6.5 - 8.1 g/dL   Albumin 4.1 3.5 - 5.0 g/dL   AST 19 15 - 41 U/L   ALT 27 0 - 44 U/L   Alkaline Phosphatase 82 38 - 126 U/L   Total Bilirubin 0.6 0.3 - 1.2 mg/dL   GFR calc non Af Amer >60 >60 mL/min   GFR calc Af Amer >60 >60 mL/min   Anion gap 8 5 - 15  Hemoglobin A1c  Result Value Ref Range   Hgb A1c MFr Bld 7.0 (H) 4.8 - 5.6 %   Mean Plasma Glucose 154 mg/dL      Assessment & Plan:   Encounter Diagnoses  Name Primary?  . Controlled type 2 diabetes mellitus without complication, without long-term current use of insulin ([place]) Yes  . Hyperlipidemia, unspecified hyperlipidemia type   . Tobacco use disorder   . Chronic obstructive pulmonary disease, unspecified COPD type ([place])   . Morbid obesity ([place])     -reviewed labs with pt -Pt has appt for mammogram next month.  -Pt reminded to return iFOBT given in august -pt encouraged to take her lipitor with a small snack to help with nausea -pt counseled to watch diabetic diet -no changes to medication today -counseled smoking cessation -pt to follow up 3 months.  RTO sooner prn

## 2019-01-28 ENCOUNTER — Ambulatory Visit (HOSPITAL_COMMUNITY): Payer: Self-pay

## 2019-03-19 ENCOUNTER — Other Ambulatory Visit (HOSPITAL_COMMUNITY): Payer: Self-pay | Admitting: Urology

## 2019-03-19 ENCOUNTER — Other Ambulatory Visit: Payer: Self-pay | Admitting: Urology

## 2019-03-19 DIAGNOSIS — N200 Calculus of kidney: Secondary | ICD-10-CM

## 2019-03-24 ENCOUNTER — Ambulatory Visit (HOSPITAL_COMMUNITY): Payer: Self-pay

## 2019-04-02 ENCOUNTER — Ambulatory Visit: Payer: Medicaid Other | Admitting: Physician Assistant

## 2019-04-02 ENCOUNTER — Encounter: Payer: Self-pay | Admitting: Physician Assistant

## 2019-04-02 DIAGNOSIS — E785 Hyperlipidemia, unspecified: Secondary | ICD-10-CM

## 2019-04-02 DIAGNOSIS — J449 Chronic obstructive pulmonary disease, unspecified: Secondary | ICD-10-CM

## 2019-04-02 DIAGNOSIS — G8929 Other chronic pain: Secondary | ICD-10-CM

## 2019-04-02 DIAGNOSIS — E119 Type 2 diabetes mellitus without complications: Secondary | ICD-10-CM

## 2019-04-02 DIAGNOSIS — M25562 Pain in left knee: Secondary | ICD-10-CM

## 2019-04-02 DIAGNOSIS — Z1239 Encounter for other screening for malignant neoplasm of breast: Secondary | ICD-10-CM

## 2019-04-02 DIAGNOSIS — E669 Obesity, unspecified: Secondary | ICD-10-CM

## 2019-04-02 DIAGNOSIS — F17200 Nicotine dependence, unspecified, uncomplicated: Secondary | ICD-10-CM

## 2019-04-02 MED ORDER — ATORVASTATIN CALCIUM 40 MG PO TABS: 20.0000 mg | ORAL_TABLET | Freq: Every day | ORAL | 2 refills | Status: DC

## 2019-04-02 MED ORDER — ALBUTEROL SULFATE HFA 108 (90 BASE) MCG/ACT IN AERS: 2.0000 | INHALATION_SPRAY | Freq: Four times a day (QID) | RESPIRATORY_TRACT | 1 refills | Status: DC | PRN

## 2019-04-02 MED ORDER — GLIPIZIDE 5 MG PO TABS: ORAL_TABLET | ORAL | 1 refills | Status: DC

## 2019-04-02 MED ORDER — GABAPENTIN 300 MG PO CAPS: 300.0000 mg | ORAL_CAPSULE | Freq: Every evening | ORAL | 4 refills | Status: DC | PRN

## 2019-04-02 NOTE — Progress Notes (Signed)
LMP 02/16/2014    Subjective:    Patient ID: Michaela Aguilar, female    DOB: [birth year], 53 y.o.   MRN: [id number]  HPI: Michaela Aguilar is a 53 y.o. female presenting on 04/02/2019 for No chief complaint on file.   HPI  This is a telemedicine appointment through [employer] due to coronavirus pandemic  I connected with  Michaela Aguilar on 04/02/19 by a video enabled telemedicine application and verified that I am speaking with the correct person using two identifiers.   I discussed the limitations of evaluation and management by telemedicine. The patient expressed understanding and agreed to proceed.   Pt is at home. Provider is at office/clinic  Pt says her legs are hurting some lately.  This is a chronic problem for her that waxes and wanes.  She has submitted a [facility] [employer] [facility] application and plans to return to dr Ninfa Linden when she gets approved.  Pt has been cutting back on smoking  She is doing better with her lipitor  She has no other complaints and is doing well except for the leg pain.   Relevant past medical, surgical, family and social history reviewed and updated as indicated. Interim medical history since our last visit reviewed. Allergies and medications reviewed and updated.   Current Outpatient Medications:  .  albuterol (PROVENTIL HFA;VENTOLIN HFA) 108 (90 Base) MCG/ACT inhaler, Inhale 2 puffs into the lungs every 6 (six) hours as needed for wheezing or shortness of breath., Disp: 3 Inhaler, Rfl: 1 .  atorvastatin (LIPITOR) 40 MG tablet, Take 1 tablet (40 mg total) by mouth daily., Disp: 90 tablet, Rfl: 2 .  gabapentin (NEURONTIN) 300 MG capsule, Take 1 capsule (300 mg total) by mouth at bedtime as needed., Disp: 60 capsule, Rfl: 1 .  glipiZIDE (GLUCOTROL) 5 MG tablet, TAKE 1 TABLET BY MOUTH TWICE DAILY BEFORE  A  MEAL, Disp: 60 tablet, Rfl: 3 .  Multiple Vitamins-Minerals (MULTIVITAMIN WITH MINERALS) tablet, Take 1 tablet by mouth daily., Disp: , Rfl:    Review of  Systems  Per HPI unless specifically indicated above     Objective:    LMP 02/16/2014   Wt Readings from Last 3 Encounters:  01/02/19 242 lb (109.8 kg)  10/02/18 239 lb 12 oz (108.7 kg)  07/02/18 241 lb 8 oz (109.5 kg)    Physical Exam Constitutional:      General: She is not in acute distress.    Appearance: She is obese. She is not ill-appearing.  HENT:     Head: Normocephalic and atraumatic.  Pulmonary:     Effort: Pulmonary effort is normal. No respiratory distress.  Neurological:     Mental Status: She is alert and oriented to person, place, and time.  Psychiatric:        Attention and Perception: Attention normal.        Mood and Affect: Mood normal.        Speech: Speech normal.        Behavior: Behavior normal. Behavior is cooperative.        Cognition and Memory: Cognition normal.           Assessment & Plan:    Encounter Diagnoses  Name Primary?  . Controlled type 2 diabetes mellitus without complication, without long-term current use of insulin ([place]) Yes  . Hyperlipidemia, unspecified hyperlipidemia type   . Chronic obstructive pulmonary disease, unspecified COPD type ([place])   . Tobacco use disorder   . Obesity, unspecified classification,  unspecified obesity type, unspecified whether serious comorbidity present   . Chronic pain of left knee   . Screening for breast cancer     -will Defer labs at this time -pt is to continue current medications -Pt is waiting on [facility] [employer] [facility] so she can go back to dr Ninfa Linden for her knee.  -will Reorder screening mammogram -pt to Follow up 3 month.  She is to contact office sooner prn

## 2019-04-09 ENCOUNTER — Ambulatory Visit (HOSPITAL_COMMUNITY): Payer: Self-pay

## 2019-04-09 ENCOUNTER — Encounter (HOSPITAL_COMMUNITY): Payer: Self-pay

## 2019-04-15 ENCOUNTER — Ambulatory Visit (HOSPITAL_COMMUNITY)
Admission: RE | Admit: 2019-04-15 | Discharge: 2019-04-15 | Disposition: A | Discharge status: Home / Self Care | Payer: Medicaid Other | Source: Ambulatory Visit | Attending: Urology | Admitting: Urology

## 2019-04-15 ENCOUNTER — Other Ambulatory Visit: Payer: Self-pay

## 2019-04-15 DIAGNOSIS — N200 Calculus of kidney: Secondary | ICD-10-CM

## 2019-04-16 ENCOUNTER — Ambulatory Visit (INDEPENDENT_AMBULATORY_CARE_PROVIDER_SITE_OTHER): Payer: Self-pay | Admitting: Urology

## 2019-04-16 DIAGNOSIS — N200 Calculus of kidney: Secondary | ICD-10-CM

## 2019-05-06 ENCOUNTER — Ambulatory Visit (HOSPITAL_COMMUNITY): Payer: Self-pay

## 2019-05-06 ENCOUNTER — Telehealth (HOSPITAL_COMMUNITY): Payer: Self-pay

## 2019-05-06 NOTE — Telephone Encounter (Signed)
Telephoned patient at home number to reschedule [facility] appointment.  Unable to leave voice message.

## 2019-06-30 ENCOUNTER — Other Ambulatory Visit: Payer: Self-pay | Admitting: Urology

## 2019-06-30 DIAGNOSIS — N200 Calculus of kidney: Secondary | ICD-10-CM

## 2019-07-09 ENCOUNTER — Ambulatory Visit: Payer: Medicaid Other | Admitting: Physician Assistant

## 2019-07-15 ENCOUNTER — Ambulatory Visit (HOSPITAL_COMMUNITY): Payer: Medicaid Other

## 2019-07-16 ENCOUNTER — Ambulatory Visit: Payer: Medicaid Other | Admitting: Urology

## 2019-07-16 ENCOUNTER — Ambulatory Visit (HOSPITAL_COMMUNITY): Payer: Medicaid Other

## 2019-07-16 ENCOUNTER — Other Ambulatory Visit (HOSPITAL_COMMUNITY)
Admission: RE | Admit: 2019-07-16 | Discharge: 2019-07-16 | Disposition: A | Discharge status: Home / Self Care | Payer: Medicaid Other | Source: Ambulatory Visit | Attending: Physician Assistant | Admitting: Physician Assistant

## 2019-07-16 DIAGNOSIS — E119 Type 2 diabetes mellitus without complications: Secondary | ICD-10-CM | POA: Diagnosis not present

## 2019-07-16 DIAGNOSIS — R69 Illness, unspecified: Secondary | ICD-10-CM | POA: Diagnosis present

## 2019-07-16 DIAGNOSIS — E785 Hyperlipidemia, unspecified: Secondary | ICD-10-CM | POA: Insufficient documentation

## 2019-07-16 LAB — HEMOGLOBIN A1C
Hgb A1c MFr Bld: 8.3 % — ABNORMAL HIGH (ref 4.8–5.6)
Mean Plasma Glucose: 191.51 mg/dL

## 2019-07-16 LAB — COMPREHENSIVE METABOLIC PANEL
ALT: 28 U/L (ref 0–44)
AST: 19 U/L (ref 15–41)
Albumin: 3.9 g/dL (ref 3.5–5.0)
Alkaline Phosphatase: 92 U/L (ref 38–126)
Anion gap: 8 (ref 5–15)
BUN: 11 mg/dL (ref 6–20)
CO2: 26 mmol/L (ref 22–32)
Calcium: 9.6 mg/dL (ref 8.9–10.3)
Chloride: 104 mmol/L (ref 98–111)
Creatinine, Ser: 0.65 mg/dL (ref 0.44–1.00)
GFR calc Af Amer: >60 mL/min (ref >60)
GFR calc non Af Amer: >60 mL/min (ref >60)
Glucose, Bld: 204 mg/dL — ABNORMAL HIGH (ref 70–99)
Potassium: 4.1 mmol/L (ref 3.5–5.1)
Sodium: 138 mmol/L (ref 135–145)
Total Bilirubin: 0.5 mg/dL (ref 0.3–1.2)
Total Protein: 7 g/dL (ref 6.5–8.1)

## 2019-07-16 LAB — LIPID PANEL
Cholesterol: 204 mg/dL — ABNORMAL HIGH (ref 0–200)
HDL: 44 mg/dL (ref >40)
LDL Cholesterol: 127 mg/dL — ABNORMAL HIGH (ref 0–99)
Total CHOL/HDL Ratio: 4.6 RATIO
Triglycerides: 167 mg/dL — ABNORMAL HIGH (ref <150)
VLDL: 33 mg/dL (ref 0–40)

## 2019-07-17 ENCOUNTER — Ambulatory Visit: Payer: Medicaid Other | Admitting: Physician Assistant

## 2019-07-17 ENCOUNTER — Encounter: Payer: Self-pay | Admitting: Physician Assistant

## 2019-07-17 DIAGNOSIS — E785 Hyperlipidemia, unspecified: Secondary | ICD-10-CM

## 2019-07-17 DIAGNOSIS — J449 Chronic obstructive pulmonary disease, unspecified: Secondary | ICD-10-CM

## 2019-07-17 DIAGNOSIS — F17200 Nicotine dependence, unspecified, uncomplicated: Secondary | ICD-10-CM

## 2019-07-17 DIAGNOSIS — E669 Obesity, unspecified: Secondary | ICD-10-CM

## 2019-07-17 DIAGNOSIS — E1165 Type 2 diabetes mellitus with hyperglycemia: Secondary | ICD-10-CM

## 2019-07-17 MED ORDER — GLIPIZIDE 10 MG PO TABS: 10.0000 mg | ORAL_TABLET | Freq: Two times a day (BID) | ORAL | 3 refills | Status: DC

## 2019-07-17 MED ORDER — ATORVASTATIN CALCIUM 40 MG PO TABS: 40.0000 mg | ORAL_TABLET | Freq: Every day | ORAL | 3 refills | Status: DC

## 2019-07-17 MED ORDER — ALBUTEROL SULFATE HFA 108 (90 BASE) MCG/ACT IN AERS: 2.0000 | INHALATION_SPRAY | Freq: Four times a day (QID) | RESPIRATORY_TRACT | 1 refills | Status: DC | PRN

## 2019-07-17 NOTE — Progress Notes (Signed)
LMP 02/16/2014    Subjective:    Patient ID: Michaela Aguilar, female    DOB: Feb 20, 1966, 53 y.o.   MRN: [id number]  HPI: Michaela Aguilar is a 53 y.o. female presenting on 07/17/2019 for No chief complaint on file.   HPI   This is a telemedicine appointment through [employer] due to coronavirus pandemic  I connected with  Michaela Aguilar on 07/17/19 by a video enabled telemedicine application and verified that I am speaking with the correct person using two identifiers.   I discussed the limitations of evaluation and management by telemedicine. The patient expressed understanding and agreed to proceed.  Pt is at home.  Provider is at office.     Pt has new patient appointment in december as she now has medicaid - started 07/15/19.  Pt is aware that having medicaid makes her ineligible to be a pt at the [facility]   She is seeing urology for kidney stone.  She says the pain comes and goes.   She is scheduled for [place] to evaluate the stone later this month.   She says she Uses her inhaler 3 or 4 times/week; she is not  Using it every day.  She is still smoking some.    Pt says she is doing well and has no complaints other than the kidney stone.      Relevant past medical, surgical, family and social history reviewed and updated as indicated. Interim medical history since our last visit reviewed. Allergies and medications reviewed and updated.    Current Outpatient Medications:  .  albuterol (VENTOLIN HFA) 108 (90 Base) MCG/ACT inhaler, Inhale 2 puffs into the lungs every 6 (six) hours as needed for wheezing or shortness of breath., Disp: 3 Inhaler, Rfl: 1 .  atorvastatin (LIPITOR) 40 MG tablet, Take 0.5 tablets (20 mg total) by mouth daily., Disp: 90 tablet, Rfl: 2 .  glipiZIDE (GLUCOTROL) 5 MG tablet, TAKE 1 TABLET BY MOUTH TWICE DAILY BEFORE  A  MEAL, Disp: 180 tablet, Rfl: 1 .  Multiple Vitamins-Minerals (MULTIVITAMIN WITH MINERALS) tablet, Take 1 tablet by mouth daily.,  Disp: , Rfl:  .  gabapentin (NEURONTIN) 300 MG capsule, Take 1 capsule (300 mg total) by mouth at bedtime as needed. (Patient not taking: Reported on 07/17/2019), Disp: 30 capsule, Rfl: 4   Review of Systems  Per HPI unless specifically indicated above     Objective:    LMP 02/16/2014   Wt Readings from Last 3 Encounters:  01/02/19 242 lb (109.8 kg)  10/02/18 239 lb 12 oz (108.7 kg)  07/02/18 241 lb 8 oz (109.5 kg)    Physical Exam Constitutional:      General: She is not in acute distress.    Appearance: She is obese. She is not ill-appearing.  HENT:     Head: Normocephalic and atraumatic.  Pulmonary:     Effort: Pulmonary effort is normal. No respiratory distress.  Neurological:     Mental Status: She is alert and oriented to person, place, and time.  Psychiatric:        Attention and Perception: Attention normal.        Speech: Speech normal.        Behavior: Behavior is cooperative.     Results for orders placed or performed during the hospital encounter of 07/16/19  Comprehensive metabolic panel  Result Value Ref Range   Sodium 138 135 - 145 mmol/L   Potassium 4.1 3.5 - 5.1  mmol/L   Chloride 104 98 - 111 mmol/L   CO2 26 22 - 32 mmol/L   Glucose, Bld 204 (H) 70 - 99 mg/dL   BUN 11 6 - 20 mg/dL   Creatinine, Ser 0.65 0.44 - 1.00 mg/dL   Calcium 9.6 8.9 - 10.3 mg/dL   Total Protein 7.0 6.5 - 8.1 g/dL   Albumin 3.9 3.5 - 5.0 g/dL   AST 19 15 - 41 U/L   ALT 28 0 - 44 U/L   Alkaline Phosphatase 92 38 - 126 U/L   Total Bilirubin 0.5 0.3 - 1.2 mg/dL   GFR calc non Af Amer >60 >60 mL/min   GFR calc Af Amer >60 >60 mL/min   Anion gap 8 5 - 15  Lipid panel  Result Value Ref Range   Cholesterol 204 (H) 0 - 200 mg/dL   Triglycerides 167 (H) <150 mg/dL   HDL 44 >40 mg/dL   Total CHOL/HDL Ratio 4.6 RATIO   VLDL 33 0 - 40 mg/dL   LDL Cholesterol 127 (H) 0 - 99 mg/dL  Hemoglobin A1c  Result Value Ref Range   Hgb A1c MFr Bld 8.3 (H) 4.8 - 5.6 %   Mean Plasma Glucose  191.51 mg/dL      Assessment & Plan:     Encounter Diagnoses  Name Primary?  Marland Kitchen Uncontrolled type 2 diabetes mellitus with hyperglycemia ([place]) Yes  . Hyperlipidemia, unspecified hyperlipidemia type   . Tobacco use disorder   . Chronic obstructive pulmonary disease, unspecified COPD type ([place])   . Obesity, unspecified classification, unspecified obesity type, unspecified whether serious comorbidity present       -reviewed labs with pt   HCM-  Pt will need her new provider to order her mammogram.  She is no longer eligible for free test due to having medicaid.    Pt had been scheduled several times for screening mammogram but wanted to reschedule each time so it has not been done.  PAP due in December 2020.  Pt was given iFOBT annually but never returned completed test to office.     Dyslipidemia-  Increase atorvastatin to 40mg  daily.  Pt encouraged to follow lowfat diet and get regular exercise    Diabetes- uncontrolled Increase glipizide to 10mg  bid.  (metformin gave pt bad GI side effects).   Refills sent to [facility]  Encouraged pt to watch diabetic diet.  Pt is due for diabetic foot exam and she is due for diabetic eye exam.     Tobacco Use disorder Encouraged smoking cessation.  Albuterol MDI refilled.   Kidney Stones- Pt to continue with urologist per his recommendations    Medicaid started 07/15/19.  Appointment early December with new provider as scheduled.

## 2019-07-29 ENCOUNTER — Ambulatory Visit (HOSPITAL_COMMUNITY)
Admission: RE | Admit: 2019-07-29 | Discharge: 2019-07-29 | Disposition: A | Discharge status: Home / Self Care | Payer: Medicaid Other | Source: Ambulatory Visit | Attending: Urology | Admitting: Urology

## 2019-07-29 ENCOUNTER — Other Ambulatory Visit: Payer: Self-pay

## 2019-07-29 DIAGNOSIS — N200 Calculus of kidney: Secondary | ICD-10-CM

## 2019-07-30 ENCOUNTER — Ambulatory Visit (INDEPENDENT_AMBULATORY_CARE_PROVIDER_SITE_OTHER): Payer: Medicaid Other | Admitting: Urology

## 2019-07-30 DIAGNOSIS — N200 Calculus of kidney: Secondary | ICD-10-CM | POA: Diagnosis not present

## 2019-08-04 ENCOUNTER — Other Ambulatory Visit: Payer: Self-pay | Admitting: Urology

## 2019-08-15 ENCOUNTER — Other Ambulatory Visit (HOSPITAL_COMMUNITY)
Admission: RE | Admit: 2019-08-15 | Discharge: 2019-08-15 | Disposition: A | Discharge status: Home / Self Care | Payer: Medicaid Other | Source: Ambulatory Visit | Attending: Urology | Admitting: Urology

## 2019-08-15 ENCOUNTER — Other Ambulatory Visit: Payer: Self-pay

## 2019-08-15 ENCOUNTER — Encounter (HOSPITAL_COMMUNITY)
Admission: RE | Admit: 2019-08-15 | Discharge: 2019-08-15 | Disposition: A | Discharge status: Home / Self Care | Payer: Medicaid Other | Source: Ambulatory Visit | Attending: Urology | Admitting: Urology

## 2019-08-15 DIAGNOSIS — Z20828 Contact with and (suspected) exposure to other viral communicable diseases: Secondary | ICD-10-CM | POA: Diagnosis not present

## 2019-08-15 DIAGNOSIS — Z01812 Encounter for preprocedural laboratory examination: Secondary | ICD-10-CM | POA: Insufficient documentation

## 2019-08-15 LAB — SARS CORONAVIRUS 2 (TAT 6-24 HRS): SARS Coronavirus 2: NEGATIVE

## 2019-08-18 ENCOUNTER — Encounter (HOSPITAL_COMMUNITY)
Admission: RE | Disposition: A | Discharge status: Home / Self Care | Payer: Self-pay | Source: Home / Self Care | Attending: Urology

## 2019-08-18 ENCOUNTER — Ambulatory Visit (HOSPITAL_COMMUNITY): Payer: Medicaid Other

## 2019-08-18 ENCOUNTER — Encounter (HOSPITAL_COMMUNITY): Payer: Self-pay | Admitting: *Deleted

## 2019-08-18 ENCOUNTER — Ambulatory Visit (HOSPITAL_COMMUNITY): Payer: Medicaid Other | Admitting: Anesthesiology

## 2019-08-18 ENCOUNTER — Inpatient Hospital Stay (HOSPITAL_COMMUNITY): Admission: RE | Admit: 2019-08-18 | Payer: Medicaid Other | Source: Ambulatory Visit

## 2019-08-18 ENCOUNTER — Ambulatory Visit (HOSPITAL_COMMUNITY)
Admission: RE | Admit: 2019-08-18 | Discharge: 2019-08-18 | Disposition: A | Discharge status: Home / Self Care | Payer: Medicaid Other | Attending: Urology | Admitting: Urology

## 2019-08-18 DIAGNOSIS — N200 Calculus of kidney: Secondary | ICD-10-CM | POA: Insufficient documentation

## 2019-08-18 DIAGNOSIS — N201 Calculus of ureter: Secondary | ICD-10-CM

## 2019-08-18 DIAGNOSIS — Z7984 Long term (current) use of oral hypoglycemic drugs: Secondary | ICD-10-CM | POA: Insufficient documentation

## 2019-08-18 DIAGNOSIS — E1165 Type 2 diabetes mellitus with hyperglycemia: Secondary | ICD-10-CM | POA: Insufficient documentation

## 2019-08-18 DIAGNOSIS — F17210 Nicotine dependence, cigarettes, uncomplicated: Secondary | ICD-10-CM | POA: Diagnosis not present

## 2019-08-18 DIAGNOSIS — J449 Chronic obstructive pulmonary disease, unspecified: Secondary | ICD-10-CM | POA: Insufficient documentation

## 2019-08-18 DIAGNOSIS — F17290 Nicotine dependence, other tobacco product, uncomplicated: Secondary | ICD-10-CM | POA: Diagnosis not present

## 2019-08-18 HISTORY — PX: CYSTOSCOPY WITH RETROGRADE PYELOGRAM, URETEROSCOPY AND STENT PLACEMENT: SHX5789

## 2019-08-18 HISTORY — PX: STONE EXTRACTION WITH BASKET: SHX5318

## 2019-08-18 LAB — GLUCOSE, CAPILLARY: Glucose-Capillary: 153 mg/dL — ABNORMAL HIGH (ref 70–99)

## 2019-08-18 SURGERY — CYSTOURETEROSCOPY, WITH RETROGRADE PYELOGRAM AND STENT INSERTION
Anesthesia: General | Laterality: Right

## 2019-08-18 MED ORDER — LIDOCAINE 2% (20 MG/ML) 5 ML SYRINGE
INTRAMUSCULAR | Status: DC | PRN
  Administered 2019-08-18: 60 mg via INTRAVENOUS

## 2019-08-18 MED ORDER — GLYCOPYRROLATE PF 0.2 MG/ML IJ SOSY
PREFILLED_SYRINGE | INTRAMUSCULAR | Status: DC | PRN
  Administered 2019-08-18: .2 mg via INTRAVENOUS

## 2019-08-18 MED ORDER — LACTATED RINGERS IV SOLN
INTRAVENOUS | Status: DC | PRN
  Administered 2019-08-18: 08:00:00 via INTRAVENOUS

## 2019-08-18 MED ORDER — PROPOFOL 10 MG/ML IV BOLUS
INTRAVENOUS | Status: DC | PRN
  Administered 2019-08-18: 180 mg via INTRAVENOUS

## 2019-08-18 MED ORDER — SODIUM CHLORIDE 0.9 % IR SOLN
Status: DC | PRN
  Administered 2019-08-18 (×2): 3000 mL

## 2019-08-18 MED ORDER — DEXAMETHASONE SODIUM PHOSPHATE 10 MG/ML IJ SOLN
INTRAMUSCULAR | Status: AC
  Filled 2019-08-18: qty 1

## 2019-08-18 MED ORDER — DIATRIZOATE MEGLUMINE 30 % UR SOLN
URETHRAL | Status: AC
  Filled 2019-08-18: qty 100

## 2019-08-18 MED ORDER — CEFAZOLIN SODIUM-DEXTROSE 2-4 GM/100ML-% IV SOLN
2.0000 g | INTRAVENOUS | Status: AC
  Administered 2019-08-18: 2 g via INTRAVENOUS

## 2019-08-18 MED ORDER — SUCCINYLCHOLINE CHLORIDE 200 MG/10ML IV SOSY
PREFILLED_SYRINGE | INTRAVENOUS | Status: AC
  Filled 2019-08-18: qty 30

## 2019-08-18 MED ORDER — SUGAMMADEX SODIUM 200 MG/2ML IV SOLN
INTRAVENOUS | Status: DC | PRN
  Administered 2019-08-18: 207.8 mg via INTRAVENOUS

## 2019-08-18 MED ORDER — PROPOFOL 10 MG/ML IV BOLUS
INTRAVENOUS | Status: AC
  Filled 2019-08-18: qty 40

## 2019-08-18 MED ORDER — SCOPOLAMINE 1 MG/3DAYS TD PT72
MEDICATED_PATCH | TRANSDERMAL | Status: AC
  Filled 2019-08-18: qty 1

## 2019-08-18 MED ORDER — WATER FOR IRRIGATION, STERILE IR SOLN
Status: DC | PRN
  Administered 2019-08-18: 500 mL

## 2019-08-18 MED ORDER — ONDANSETRON HCL 4 MG/2ML IJ SOLN
INTRAMUSCULAR | Status: DC | PRN
  Administered 2019-08-18: 4 mg via INTRAVENOUS

## 2019-08-18 MED ORDER — DEXAMETHASONE SODIUM PHOSPHATE 4 MG/ML IJ SOLN
INTRAMUSCULAR | Status: DC | PRN
  Administered 2019-08-18: 8 mg via INTRAVENOUS

## 2019-08-18 MED ORDER — DIATRIZOATE MEGLUMINE 30 % UR SOLN
URETHRAL | Status: DC | PRN
  Administered 2019-08-18: 9 mL via URETHRAL

## 2019-08-18 MED ORDER — LACTATED RINGERS IV SOLN
Freq: Once | INTRAVENOUS | Status: AC
  Administered 2019-08-18: 08:00:00 via INTRAVENOUS

## 2019-08-18 MED ORDER — ONDANSETRON HCL 4 MG/2ML IJ SOLN: 4.0000 mg | Freq: Once | INTRAMUSCULAR | Status: DC | PRN

## 2019-08-18 MED ORDER — ROCURONIUM BROMIDE 50 MG/5ML IV SOSY
PREFILLED_SYRINGE | INTRAVENOUS | Status: DC | PRN
  Administered 2019-08-18: 30 mg via INTRAVENOUS

## 2019-08-18 MED ORDER — HYDROMORPHONE HCL 1 MG/ML IJ SOLN
0.2500 mg | INTRAMUSCULAR | Status: DC | PRN
  Administered 2019-08-18: 0.5 mg via INTRAVENOUS
  Filled 2019-08-18: qty 0.5

## 2019-08-18 MED ORDER — CEFAZOLIN SODIUM-DEXTROSE 2-4 GM/100ML-% IV SOLN
INTRAVENOUS | Status: AC
  Filled 2019-08-18: qty 100

## 2019-08-18 MED ORDER — FENTANYL CITRATE (PF) 100 MCG/2ML IJ SOLN
INTRAMUSCULAR | Status: DC | PRN
  Administered 2019-08-18: 100 ug via INTRAVENOUS

## 2019-08-18 MED ORDER — ONDANSETRON HCL 4 MG/2ML IJ SOLN
INTRAMUSCULAR | Status: AC
  Filled 2019-08-18: qty 2

## 2019-08-18 MED ORDER — FENTANYL CITRATE (PF) 100 MCG/2ML IJ SOLN
INTRAMUSCULAR | Status: AC
  Filled 2019-08-18: qty 2

## 2019-08-18 MED ORDER — SUCCINYLCHOLINE CHLORIDE 20 MG/ML IJ SOLN
INTRAMUSCULAR | Status: DC | PRN
  Administered 2019-08-18: 160 mg via INTRAVENOUS

## 2019-08-18 MED ORDER — LIDOCAINE 2% (20 MG/ML) 5 ML SYRINGE
INTRAMUSCULAR | Status: AC
  Filled 2019-08-18: qty 10

## 2019-08-18 MED ORDER — OXYCODONE-ACETAMINOPHEN 10-325 MG PO TABS: 1.0000 | ORAL_TABLET | ORAL | 0 refills | Status: DC | PRN

## 2019-08-18 MED ORDER — GLYCOPYRROLATE PF 0.2 MG/ML IJ SOSY
PREFILLED_SYRINGE | INTRAMUSCULAR | Status: AC
  Filled 2019-08-18: qty 2

## 2019-08-18 MED ORDER — SCOPOLAMINE 1 MG/3DAYS TD PT72
1.0000 | MEDICATED_PATCH | Freq: Once | TRANSDERMAL | Status: DC
  Administered 2019-08-18: 1.5 mg via TRANSDERMAL

## 2019-08-18 MED ORDER — MEPERIDINE HCL 50 MG/ML IJ SOLN: 6.2500 mg | INTRAMUSCULAR | Status: DC | PRN

## 2019-08-18 MED ORDER — ONDANSETRON 4 MG PO TBDP: 4.0000 mg | ORAL_TABLET | Freq: Three times a day (TID) | ORAL | 0 refills | Status: DC | PRN

## 2019-08-18 SURGICAL SUPPLY — 25 items
BAG DRAIN URO TABLE W/ADPT NS (BAG) ×3 IMPLANT
BAG DRN 8 ADPR NS SKTRN CSTL (BAG) ×1
CATH INTERMIT  6FR 70CM (CATHETERS) ×3 IMPLANT
CLOTH BEACON ORANGE TIMEOUT ST (SAFETY) ×3 IMPLANT
DECANTER SPIKE VIAL GLASS SM (MISCELLANEOUS) ×3 IMPLANT
EXTRACTOR STONE NITINOL NGAGE (UROLOGICAL SUPPLIES) ×3 IMPLANT
GLOVE BIO SURGEON STRL SZ8 (GLOVE) ×3 IMPLANT
GLOVE BIOGEL PI IND STRL 7.0 (GLOVE) ×2 IMPLANT
GLOVE BIOGEL PI INDICATOR 7.0 (GLOVE) ×4
GOWN STRL REUS W/ TWL XL LVL3 (GOWN DISPOSABLE) ×1 IMPLANT
GOWN STRL REUS W/TWL LRG LVL3 (GOWN DISPOSABLE) ×3 IMPLANT
GOWN STRL REUS W/TWL XL LVL3 (GOWN DISPOSABLE) ×3
GUIDEWIRE STR DUAL SENSOR (WIRE) ×3 IMPLANT
GUIDEWIRE STR ZIPWIRE 035X150 (MISCELLANEOUS) ×3 IMPLANT
IV NS IRRIG 3000ML ARTHROMATIC (IV SOLUTION) ×6 IMPLANT
KIT TURNOVER CYSTO (KITS) ×3 IMPLANT
MANIFOLD NEPTUNE II (INSTRUMENTS) ×3 IMPLANT
PACK CYSTO (CUSTOM PROCEDURE TRAY) ×3 IMPLANT
PAD ARMBOARD 7.5X6 YLW CONV (MISCELLANEOUS) ×3 IMPLANT
SHEATH URETERAL 12FRX35CM (MISCELLANEOUS) ×2 IMPLANT
STENT URET 6FRX26 CONTOUR (STENTS) ×2 IMPLANT
SYR 10ML LL (SYRINGE) ×3 IMPLANT
TAPE CLOTH SILK CARING 3INX10 (GAUZE/BANDAGES/DRESSINGS) ×2 IMPLANT
TOWEL OR 17X26 4PK STRL BLUE (TOWEL DISPOSABLE) ×3 IMPLANT
WATER STERILE IRR 500ML POUR (IV SOLUTION) ×3 IMPLANT

## 2019-08-18 NOTE — Transfer of Care (Signed)
Immediate Anesthesia Transfer of Care Note  Patient: Michaela Aguilar  Procedure(s) Performed: CYSTOSCOPY WITH RETROGRADE PYELOGRAM, URETEROSCOPY AND STENT PLACEMENT (Right )  Patient Location: PACU  Anesthesia Type:General  Level of Consciousness: awake, alert , oriented and patient cooperative  Airway & Oxygen Therapy: Patient Spontanous Breathing  Post-op Assessment: Report given to RN and Post -op Vital signs reviewed and stable  Post vital signs: Reviewed and stable  Last Vitals:  Vitals Value Taken Time  BP    Temp    Pulse 71 08/18/19 0927  Resp 16 08/18/19 0927  SpO2 95 % 08/18/19 0927  Vitals shown include unvalidated device data.  Last Pain:  Vitals:   08/18/19 0714  TempSrc: Oral  PainSc: 0-No pain         Complications: No apparent anesthesia complications

## 2019-08-18 NOTE — Discharge Instructions (Signed)

## 2019-08-18 NOTE — Anesthesia Procedure Notes (Signed)
Procedure Name: Intubation Date/Time: 08/18/2019 8:44 AM Performed by: Andree Elk, Amy A, CRNA Pre-anesthesia Checklist: Patient identified, Patient being monitored, Timeout performed, Emergency Drugs available and Suction available Patient Re-evaluated:Patient Re-evaluated prior to induction Oxygen Delivery Method: Circle system utilized Preoxygenation: Pre-oxygenation with 100% oxygen Induction Type: IV induction Ventilation: Mask ventilation without difficulty Laryngoscope Size: Mac and 3 Grade View: Grade I Tube type: Oral Tube size: 7.0 mm Number of attempts: 1 Airway Equipment and Method: Stylet Placement Confirmation: ETT inserted through vocal cords under direct vision,  positive ETCO2 and breath sounds checked- equal and bilateral Secured at: 21 cm Tube secured with: Tape Dental Injury: Teeth and Oropharynx as per pre-operative assessment

## 2019-08-18 NOTE — H&P (Signed)
Urology Admission H&P  Chief Complaint: right flank pain  History of Present Illness: Michaela Aguilar is a 53yo with a hx of nephrolithiasis who has been followed for a rigth renal calculus. 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 No LUTS  Past Medical History:  Diagnosis Date  . Anxiety   . Bronchitis   . Depression   . Gestational diabetes    1992  . History of kidney stones   . Kidney stones per pt.   Past Surgical History:  Procedure Laterality Date  . CESAREAN SECTION     x2  . CYSTOSCOPY WITH STENT PLACEMENT Right 09/06/2016   Procedure: CYSTOSCOPY WITH RIGHT URETERAL STENT PLACEMENT;  Surgeon: Cleon Gustin, MD;  Location: [facility];  Service: Urology;  Laterality: Right;  . CYSTOSCOPY/RETROGRADE/URETEROSCOPY/STONE EXTRACTION WITH BASKET Right 09/06/2016   Procedure: CYSTOSCOPY/RIGHT RETROGRADE PYELOGRAM, RIGHT RENAL STONE EXTRACTION WITH LASER;  Surgeon: Cleon Gustin, MD;  Location: [facility];  Service: Urology;  Laterality: Right;  . [place] 2010  . HERNIA REPAIR     incisional hernia    Home Medications:  Current Facility-Administered Medications  Medication Dose Route Frequency Provider Last Rate Last Dose  . ceFAZolin (ANCEF) IVPB 2g/100 mL premix  2 g Intravenous 30 min Pre-Op , Candee Furbish, MD      . scopolamine (TRANSDERM-SCOP) 1 MG/3DAYS 1.5 mg  1 patch Transdermal Once Denese Killings, MD   1.5 mg at 08/18/19 [id number]   Allergies: No Known Allergies  Family History  Problem Relation Age of Onset  . Osteoporosis Mother   . Depression Father   . Mental illness Father   . Cancer Maternal Uncle        lung cancer  . COPD Maternal Uncle   . Depression Maternal Uncle   . Emphysema Maternal Grandmother   . Cancer Maternal Grandfather        lung cancer  . Breast cancer Neg Hx    Social History:  reports that she has been smoking cigarettes and cigars. She has a 3.75 pack-year  smoking history. She has never used smokeless tobacco. She reports that she does not drink alcohol or use drugs.  Review of Systems  Genitourinary: Positive for flank pain.  All other systems reviewed and are negative.   Physical Exam:  Vital signs in last 24 hours: Temp:  [98.3 F (36.8 C)] 98.3 F (36.8 C) (10/05 0714) Pulse Rate:  [66] 66 (10/05 0714) Resp:  [23] 23 (10/05 0714) BP: (129)/(81) 129/81 (10/05 0714) SpO2:  [95 %] 95 % (10/05 0714) Weight:  [103.9 kg] 103.9 kg (10/05 0714) Physical Exam  Constitutional: She is oriented to person, place, and time. She appears well-developed and well-nourished.  HENT:  Head: Normocephalic and atraumatic.  Eyes: Pupils are equal, round, and reactive to light. EOM are normal.  Neck: Normal range of motion. No thyromegaly present.  Cardiovascular: Normal rate and regular rhythm.  Respiratory: Effort normal. No respiratory distress.  GI: Soft. She exhibits no distension.  Musculoskeletal: Normal range of motion.        General: No edema.  Neurological: She is alert and oriented to person, place, and time.  Skin: Skin is warm and dry.  Psychiatric: She has a normal mood and affect. Her behavior is normal. Judgment and thought content normal.    Laboratory Data:  Results for orders placed or performed during the hospital encounter of 08/18/19 (from the past  24 hour(s))  Glucose, capillary     Status: Abnormal   Collection Time: 08/18/19  7:25 AM  Result Value Ref Range   Glucose-Capillary 153 (H) 70 - 99 mg/dL   Recent Results (from the past 240 hour(s))  SARS CORONAVIRUS 2 (TAT 6-24 HRS) Nasopharyngeal Nasopharyngeal Swab     Status: None   Collection Time: 08/15/19  7:12 AM   Specimen: Nasopharyngeal Swab  Result Value Ref Range Status   SARS Coronavirus 2 NEGATIVE NEGATIVE Final    Comment: (NOTE) SARS-CoV-2 target nucleic acids are NOT DETECTED. The SARS-CoV-2 RNA is generally detectable in upper and lower respiratory  specimens during the acute phase of infection. Negative results do not preclude SARS-CoV-2 infection, do not rule out co-infections with other pathogens, and should not be used as the sole basis for treatment or other patient management decisions. Negative results must be combined with clinical observations, patient history, and epidemiological information. The expected result is Negative. Fact Sheet for Patients: SugarRoll.be Fact Sheet for Healthcare Providers: https://www.woods-mathews.com/ This test is not yet approved or cleared by the [place] FDA and  has been authorized for detection and/or diagnosis of SARS-CoV-2 by FDA under an Emergency Use Authorization (EUA). This EUA will remain  in effect (meaning this test can be used) for the duration of the COVID-19 declaration under Section 56 4(b)(1) of the Act, 21 U.S.C. section 360bbb-3(b)(1), unless the authorization is terminated or revoked sooner. Performed at [facility], [place] [address] [place], [place] [address]    Creatinine: No results for input(s): CREATININE in the last 168 hours. Baseline Creatinine: unkown   Impression/Assessment:  53yo with right flank pain  Plan:  The risks, benefits, alternatives to right ureteroscopic stone extraction was explained to the patient and she understands and wishes to proceed with surgery  Nicolette Bang 08/18/2019, 8:33 AM

## 2019-08-18 NOTE — Anesthesia Preprocedure Evaluation (Signed)
Anesthesia Evaluation  Patient identified by MRN, date of birth, ID band Patient awake    Reviewed: Allergy & Precautions, NPO status , Patient's Chart, lab work & pertinent test results  History of Anesthesia Complications Negative for: history of anesthetic complications  Airway Mallampati: II  TM Distance: >3 FB Neck ROM: Full    Dental  (+) Missing   Pulmonary COPD,  COPD inhaler, Current Smoker and Patient abstained from smoking.,    Pulmonary exam normal breath sounds clear to auscultation       Cardiovascular Exercise Tolerance: Good negative cardio ROS Normal cardiovascular exam Rhythm:Regular Rate:Normal     Neuro/Psych PSYCHIATRIC DISORDERS Anxiety Depression    GI/Hepatic negative GI ROS, Neg liver ROS,   Endo/Other  diabetes, Poorly Controlled, Type 2, Oral Hypoglycemic Agents  Renal/GU Renal disease     Musculoskeletal   Abdominal   Peds  Hematology negative hematology ROS (+)   Anesthesia Other Findings   Reproductive/Obstetrics                             Anesthesia Physical Anesthesia Plan  ASA: III  Anesthesia Plan: General   Post-op Pain Management:    Induction: Intravenous  PONV Risk Score and Plan: 3 and Midazolam, Metaclopromide and Scopolamine patch - Pre-op  Airway Management Planned: Oral ETT  Additional Equipment:   Intra-op Plan:   Post-operative Plan: Extubation in OR  Informed Consent: I have reviewed the patients History and Physical, chart, labs and discussed the procedure including the risks, benefits and alternatives for the proposed anesthesia with the patient or authorized representative who has indicated his/her understanding and acceptance.     Dental advisory given  Plan Discussed with: CRNA  Anesthesia Plan Comments:         Anesthesia Quick Evaluation

## 2019-08-18 NOTE — Anesthesia Postprocedure Evaluation (Signed)
Anesthesia Post Note  Patient: Michaela Aguilar  Procedure(s) Performed: CYSTOSCOPY WITH RETROGRADE PYELOGRAM, URETEROSCOPY AND STENT PLACEMENT (Right )  Patient location during evaluation: PACU Anesthesia Type: General Level of consciousness: awake and alert and oriented Pain management: pain level controlled Vital Signs Assessment: post-procedure vital signs reviewed and stable Respiratory status: spontaneous breathing Cardiovascular status: stable Postop Assessment: no apparent nausea or vomiting Anesthetic complications: no     Last Vitals:  Vitals:   08/18/19 0714 08/18/19 0930  BP: 129/81 (!) 109/93  Pulse: 66 73  Resp: (!) 23 15  Temp: 36.8 C 36.9 C  SpO2: 95% 96%    Last Pain:  Vitals:   08/18/19 0930  TempSrc:   PainSc: Asleep                 ADAMS, AMY A

## 2019-08-18 NOTE — Op Note (Signed)
.  Preoperative diagnosis: right renal calculi  Postoperative diagnosis: Same  Procedure: 1 cystoscopy 2.  right retrograde pyelography 3.  Intraoperative fluoroscopy, under one hour, with interpretation 4.  right ureteroscopic stone manipulation with basket extraction 5.  right 6 x 26 JJ stent placement  Attending: Nicolette Bang  Anesthesia: General  Estimated blood loss: None  Drains: right 6 x 26 JJ ureteral stent with tether  Specimens: stone for analysis  Antibiotics: ancef  Findings: right lower pole calculi. No hydronephrosis. No masses/lesions in the bladder. Ureteral orifices in normal anatomic location.  Indications: Patient is a 53 year old female with a history of right renal stone and who has persistent right flank pain.  After discussing treatment options, she decided proceed with right ureteroscopic stone manipulation.  Procedure her in detail: The patient was brought to the operating room and a brief timeout was done to ensure correct patient, correct procedure, correct site.  General anesthesia was administered patient was placed in dorsal lithotomy position.  Her genitalia was then prepped and draped in usual sterile fashion.  A rigid 74 French cystoscope was passed in the urethra and the bladder.  Bladder was inspected free masses or lesions.  the ureteral orifices were in the normal orthotopic locations.  a 6 french ureteral catheter was then instilled into the right ureteral orifice.  a gentle retrograde was obtained and findings noted above.  we then placed a zip wire through the ureteral catheter and advanced up to the renal pelvis.  we then removed the cystoscope and cannulated the left ureteral orifice with a semirigid ureteroscope.  No stone was found in the ureter. Once we reached the UPJ a sensor wire was advanced in to the renal pelvis. We then removed the ureteroscope and advanced am 12/14 x 35cm access sheath up to the renal pelvis. We then used the flexible  ureteroscope to perform nephroscopy. We encountered the stone in the lower pole.    The stones were then removed with a Ngage basket.    once all stones were removed we then removed the access sheath under direct vision and noted no injury to the ureter. We then placed a 6 x 26 double-j ureteral stent over the original zip wire.  We then removed the wire and good coil was noted in the the renal pelvis under fluoroscopy and the bladder under direct vision. the bladder was then drained and this concluded the procedure which was well tolerated by patient.  Complications: None  Condition: Stable, extubated, transferred to PACU  Plan: Patient is to be discharged home as to follow-up in one week. She is to remove her stent in 72 hours by pulling the tether.

## 2019-08-19 ENCOUNTER — Encounter (HOSPITAL_COMMUNITY): Payer: Self-pay | Admitting: Urology

## 2019-08-27 ENCOUNTER — Ambulatory Visit (INDEPENDENT_AMBULATORY_CARE_PROVIDER_SITE_OTHER): Payer: Medicaid Other | Admitting: Urology

## 2019-08-27 DIAGNOSIS — N200 Calculus of kidney: Secondary | ICD-10-CM

## 2019-08-27 LAB — CALCULI, WITH PHOTOGRAPH (CLINICAL LAB)
Calcium Oxalate Dihydrate: 50 %
Calcium Oxalate Monohydrate: 37 %
Carbonate Apatite: 3 %
Hydroxyapatite: 10 %
Weight Calculi: 27 mg

## 2019-08-28 ENCOUNTER — Other Ambulatory Visit: Payer: Self-pay | Admitting: Physician Assistant

## 2019-09-24 ENCOUNTER — Other Ambulatory Visit: Payer: Self-pay | Admitting: Urology

## 2019-09-24 ENCOUNTER — Other Ambulatory Visit: Payer: Self-pay | Admitting: Physician Assistant

## 2019-09-24 DIAGNOSIS — N200 Calculus of kidney: Secondary | ICD-10-CM

## 2019-10-02 ENCOUNTER — Ambulatory Visit (HOSPITAL_COMMUNITY): Admission: RE | Admit: 2019-10-02 | Payer: Medicaid Other | Source: Ambulatory Visit

## 2019-10-02 ENCOUNTER — Encounter (HOSPITAL_COMMUNITY): Payer: Self-pay

## 2019-10-08 ENCOUNTER — Other Ambulatory Visit: Payer: Self-pay

## 2019-10-08 ENCOUNTER — Ambulatory Visit (HOSPITAL_COMMUNITY)
Admission: RE | Admit: 2019-10-08 | Discharge: 2019-10-08 | Disposition: A | Discharge status: Home / Self Care | Payer: Medicaid Other | Source: Ambulatory Visit | Attending: Urology | Admitting: Urology

## 2019-10-08 ENCOUNTER — Ambulatory Visit (INDEPENDENT_AMBULATORY_CARE_PROVIDER_SITE_OTHER): Payer: Medicaid Other | Admitting: Urology

## 2019-10-08 DIAGNOSIS — N200 Calculus of kidney: Secondary | ICD-10-CM | POA: Insufficient documentation

## 2019-10-15 ENCOUNTER — Ambulatory Visit: Payer: Medicaid Other | Admitting: Family Medicine

## 2019-10-15 ENCOUNTER — Encounter: Payer: Self-pay | Admitting: Family Medicine

## 2019-11-04 ENCOUNTER — Ambulatory Visit (INDEPENDENT_AMBULATORY_CARE_PROVIDER_SITE_OTHER): Payer: Medicaid Other | Admitting: Family Medicine

## 2019-11-04 ENCOUNTER — Other Ambulatory Visit: Payer: Self-pay

## 2019-11-04 ENCOUNTER — Encounter: Payer: Self-pay | Admitting: Family Medicine

## 2019-11-04 VITALS — BP 128/78 | HR 74 | Temp 98.9°F | Resp 15 | Ht 65.0 in | Wt 231.1 lb

## 2019-11-04 DIAGNOSIS — E1169 Type 2 diabetes mellitus with other specified complication: Secondary | ICD-10-CM

## 2019-11-04 DIAGNOSIS — E785 Hyperlipidemia, unspecified: Secondary | ICD-10-CM

## 2019-11-04 DIAGNOSIS — J441 Chronic obstructive pulmonary disease with (acute) exacerbation: Secondary | ICD-10-CM

## 2019-11-04 DIAGNOSIS — Z720 Tobacco use: Secondary | ICD-10-CM

## 2019-11-04 DIAGNOSIS — Z1211 Encounter for screening for malignant neoplasm of colon: Secondary | ICD-10-CM

## 2019-11-04 DIAGNOSIS — E119 Type 2 diabetes mellitus without complications: Secondary | ICD-10-CM

## 2019-11-04 DIAGNOSIS — Z1231 Encounter for screening mammogram for malignant neoplasm of breast: Secondary | ICD-10-CM

## 2019-11-04 DIAGNOSIS — Z124 Encounter for screening for malignant neoplasm of cervix: Secondary | ICD-10-CM

## 2019-11-04 DIAGNOSIS — E559 Vitamin D deficiency, unspecified: Secondary | ICD-10-CM | POA: Diagnosis not present

## 2019-11-04 DIAGNOSIS — R030 Elevated blood-pressure reading, without diagnosis of hypertension: Secondary | ICD-10-CM

## 2019-11-04 DIAGNOSIS — R062 Wheezing: Secondary | ICD-10-CM

## 2019-11-04 MED ORDER — ATORVASTATIN CALCIUM 40 MG PO TABS: 40.0000 mg | ORAL_TABLET | Freq: Every day | ORAL | 3 refills | Status: DC

## 2019-11-04 MED ORDER — ALBUTEROL SULFATE HFA 108 (90 BASE) MCG/ACT IN AERS: 2.0000 | INHALATION_SPRAY | Freq: Four times a day (QID) | RESPIRATORY_TRACT | 1 refills | Status: DC | PRN

## 2019-11-04 MED ORDER — LISINOPRIL 10 MG PO TABS: 10.0000 mg | ORAL_TABLET | Freq: Every day | ORAL | 1 refills | Status: DC

## 2019-11-04 MED ORDER — GLIPIZIDE 10 MG PO TABS: 10.0000 mg | ORAL_TABLET | Freq: Two times a day (BID) | ORAL | 3 refills | Status: DC

## 2019-11-04 NOTE — Patient Instructions (Addendum)
  I appreciate the opportunity to provide you with care for your health and wellness. Today we discussed: establish care  Follow up: 3 months   Labs today.  Referrals for colonoscopy, pap smear and mammogram placed today.  Please take all medications as directed. Work on exercising at least 5 days of the week.  I hope you have a wonderful, happy, safe, and healthy Holiday Season! See you in the New Year :)  Please continue to practice social distancing to keep you, your family, and our community safe.  If you must go out, please wear a mask and practice good handwashing.  It was a pleasure to see you and I look forward to continuing to work together on your health and well-being. Please do not hesitate to call the office if you need care or have questions about your care.  Have a wonderful day and week. With Gratitude, Cherly Beach, DNP, AGNP-BC

## 2019-11-04 NOTE — Progress Notes (Signed)
Subjective:  Patient ID: Michaela Aguilar, female    DOB: 10/08/1966  Age: 53 y.o. MRN: [id number]  CC:  Chief Complaint  Patient presents with  . New Patient (Initial Visit)    establish care      HPI  HPI  [name] is a 53 year old female patient who presents today for to establish care.  [facility].  Has insurance now.  Moved down here from [place] about 14+ years ago.  Considers this home.  Has an extensive history that includes but is not limited to bronchitis, anxiety, depression, history of kidney stones, diabetes, high cholesterol among others.  Health maintenance needs colonoscopy, mammogram, Pap smear, follow-up on pneumonia vaccine, has had flu vaccine reports having hep C and HIV screenings before she is on 100% sure.  Reports taking all of her medications that she is currently been on but needs refills today.  She is a current everyday smoker reports that she smokes about a quarter a pack a day for the last 15+ years.  Does not currently drink alcohol.  Stopped marijuana in October 2017 denies any other illicit drug use.  Has 2 grown children and is divorced.  2 cats in the home.  Denies having any allergies to him them.  Reports that she likes to be outside and is usually physically active.  But notes that she is put on weight.  Reports that she has been trying to eat a lot better has an air Rolly Salter now.  Needs to include more veggies in her diet.  Has changed to sugar-free soda.  Reports drinking 4 cups of water daily.  Overall she does not have any complaints today in the office just needs to get set up and establish to that she can get her prescription refills and follow-up on her screening test.  Today patient denies signs and symptoms of COVID 19 infection including fever, chills, cough, shortness of breath, and headache. Past Medical, Surgical, Social History, Allergies, and Medications have been Reviewed.   Past Medical History:  Diagnosis  Date  . Anxiety   . Bronchitis   . Chronic pain of left knee 04/03/2017  . Chronic pain of right knee 08/16/2017  . Depression   . Gestational diabetes    1992  . History of kidney stones   . Kidney stones per pt.  . Low back pain 11/17/2015  . Neck pain 11/17/2015    Current Meds  Medication Sig  . albuterol (VENTOLIN HFA) 108 (90 Base) MCG/ACT inhaler Inhale 2 puffs into the lungs every 6 (six) hours as needed for wheezing or shortness of breath.  Marland Kitchen atorvastatin (LIPITOR) 40 MG tablet Take 1 tablet (40 mg total) by mouth daily.  Marland Kitchen glipiZIDE (GLUCOTROL) 10 MG tablet Take 1 tablet (10 mg total) by mouth 2 (two) times daily before a meal.  . Multiple Vitamins-Minerals (MULTIVITAMIN WITH MINERALS) tablet Take 1 tablet by mouth daily.  . ondansetron (ZOFRAN ODT) 4 MG disintegrating tablet Take 1 tablet (4 mg total) by mouth every 8 (eight) hours as needed for nausea or vomiting.  . [DISCONTINUED] albuterol (VENTOLIN HFA) 108 (90 Base) MCG/ACT inhaler Inhale 2 puffs into the lungs every 6 (six) hours as needed for wheezing or shortness of breath.  . [DISCONTINUED] atorvastatin (LIPITOR) 40 MG tablet Take 1 tablet (40 mg total) by mouth daily.  . [DISCONTINUED] glipiZIDE (GLUCOTROL) 10 MG tablet Take 1 tablet (10 mg total) by mouth  2 (two) times daily before a meal.    ROS:  Review of Systems  Constitutional: Negative.   HENT: Negative.   Eyes: Negative.   Respiratory: Negative.   Cardiovascular: Positive for palpitations.  Gastrointestinal: Negative.   Genitourinary: Negative.   Musculoskeletal: Negative.   Skin: Negative.   Neurological: Negative.   Endo/Heme/Allergies: Negative.   Psychiatric/Behavioral: Negative.   All other systems reviewed and are negative.    Objective:   Today's Vitals: BP 128/78   Pulse 74   Temp 98.9 F (37.2 C) (Oral)   Resp 15   Ht 5\' 5"  (1.651 m)   Wt 231 lb 1.9 oz (104.8 kg)   LMP 02/16/2014   SpO2 96%   BMI 38.46 kg/m  Vitals with BMI  11/04/2019 08/18/2019 08/18/2019  Height 5\' 5"  - -  Weight 231 lbs 2 oz - -  BMI 99991111 - -  Systolic 0000000 99991111 0000000  Diastolic 78 75 93  Pulse 74 67 73     Physical Exam Vitals and nursing note reviewed.  Constitutional:      Appearance: Normal appearance. She is well-developed and well-groomed. She is obese.  HENT:     Head: Normocephalic and atraumatic.     Right Ear: External ear normal.     Left Ear: External ear normal.     Nose: Nose normal.     Mouth/Throat:     Mouth: Mucous membranes are moist.     Pharynx: Oropharynx is clear.  Eyes:     General:        Right eye: No discharge.        Left eye: No discharge.     Conjunctiva/sclera: Conjunctivae normal.  Cardiovascular:     Rate and Rhythm: Normal rate and regular rhythm.     Pulses: Normal pulses.     Heart sounds: Normal heart sounds.  Pulmonary:     Effort: Pulmonary effort is normal.     Breath sounds: Normal breath sounds.  Musculoskeletal:        General: Normal range of motion.     Cervical back: Normal range of motion and neck supple.  Skin:    General: Skin is warm.  Neurological:     General: No focal deficit present.     Mental Status: She is alert and oriented to person, place, and time.  Psychiatric:        Attention and Perception: Attention normal.        Mood and Affect: Mood normal.        Speech: Speech normal.        Behavior: Behavior normal. Behavior is cooperative.        Thought Content: Thought content normal.        Cognition and Memory: Cognition normal.        Judgment: Judgment normal.    EKG:  EKG in office demonstrated normal sinus rhythm with a rate of 72 bpm, septal infarct, age undetermined, abnormal ECG in comparison to previous ECG consistent.     Assessment   1. Diabetes mellitus without complication ([place])   2. Morbid obesity ([place])   3. Vitamin D deficiency   4. COPD with acute exacerbation ([place])   5. Hyperlipidemia associated with type 2 diabetes mellitus ([place])     6. Encounter for screening mammogram for malignant neoplasm of breast   7. Encounter for screening for malignant neoplasm of colon   8. Encounter for screening for malignant neoplasm of cervix   9.  Pre-hypertension   10. Wheezing   11. Nicotine abuse     Tests ordered Orders Placed This Encounter  Procedures  . MM 3D SCREEN BREAST BILATERAL  . CBC  . COMPLETE METABOLIC PANEL WITH GFR  . Hemoglobin A1c  . Lipid panel  . TSH  . VITAMIN D 25 Hydroxy  . Microalbumin / creatinine urine ratio  . Ambulatory referral to Obstetrics / Gynecology  . Ambulatory referral to Gastroenterology     Plan: Please see assessment and plan per problem list above.   Meds ordered this encounter  Medications  . lisinopril (ZESTRIL) 10 MG tablet    Sig: Take 1 tablet (10 mg total) by mouth daily.    Dispense:  30 tablet    Refill:  1    Order Specific Question:   Supervising Provider    Answer:   SIMPSON, MARGARET E [id number]  . atorvastatin (LIPITOR) 40 MG tablet    Sig: Take 1 tablet (40 mg total) by mouth daily.    Dispense:  30 tablet    Refill:  3    Order Specific Question:   Supervising Provider    Answer:   SIMPSON, MARGARET E [id number]  . albuterol (VENTOLIN HFA) 108 (90 Base) MCG/ACT inhaler    Sig: Inhale 2 puffs into the lungs every 6 (six) hours as needed for wheezing or shortness of breath.    Dispense:  8.5 g    Refill:  1    Please dispense 1 inhaler    Order Specific Question:   Supervising Provider    Answer:   SIMPSON, MARGARET E [id number]  . glipiZIDE (GLUCOTROL) 10 MG tablet    Sig: Take 1 tablet (10 mg total) by mouth 2 (two) times daily before a meal.    Dispense:  60 tablet    Refill:  3    Order Specific Question:   Supervising Provider    Answer:   Jacklynn Bue    Patient to follow-up in 02/04/2020   Perlie Mayo, NP

## 2019-11-05 ENCOUNTER — Ambulatory Visit: Payer: Medicaid Other | Admitting: Family Medicine

## 2019-11-05 ENCOUNTER — Encounter (INDEPENDENT_AMBULATORY_CARE_PROVIDER_SITE_OTHER): Payer: Self-pay | Admitting: *Deleted

## 2019-11-05 LAB — COMPLETE METABOLIC PANEL WITH GFR
AG Ratio: 1.7 (calc) (ref 1.0–2.5)
ALT: 21 U/L (ref 6–29)
AST: 16 U/L (ref 10–35)
Albumin: 4.5 g/dL (ref 3.6–5.1)
Alkaline phosphatase (APISO): 101 U/L (ref 37–153)
BUN: 13 mg/dL (ref 7–25)
CO2: 29 mmol/L (ref 20–32)
Calcium: 10.5 mg/dL — ABNORMAL HIGH (ref 8.6–10.4)
Chloride: 104 mmol/L (ref 98–110)
Creat: 0.76 mg/dL (ref 0.50–1.05)
GFR, Est African American: 102 mL/min/{1.73_m2} (ref >=60)
GFR, Est Non African American: 88 mL/min/{1.73_m2} (ref >=60)
Globulin: 2.6 g/dL (calc) (ref 1.9–3.7)
Glucose, Bld: 115 mg/dL — ABNORMAL HIGH (ref 65–99)
Potassium: 4.5 mmol/L (ref 3.5–5.3)
Sodium: 140 mmol/L (ref 135–146)
Total Bilirubin: 0.5 mg/dL (ref 0.2–1.2)
Total Protein: 7.1 g/dL (ref 6.1–8.1)

## 2019-11-05 LAB — HEMOGLOBIN A1C
Hgb A1c MFr Bld: 6.9 %{Hb} — ABNORMAL HIGH
Mean Plasma Glucose: 151 (calc)
eAG (mmol/L): 8.4 (calc)

## 2019-11-05 LAB — CBC
HCT: 43.9 % (ref 35.0–45.0)
Hemoglobin: 15.2 g/dL (ref 11.7–15.5)
MCH: 32.8 pg (ref 27.0–33.0)
MCHC: 34.6 g/dL (ref 32.0–36.0)
MCV: 94.6 fL (ref 80.0–100.0)
MPV: 10 fL (ref 7.5–12.5)
Platelets: 371 10*3/uL (ref 140–400)
RBC: 4.64 10*6/uL (ref 3.80–5.10)
RDW: 11.4 % (ref 11.0–15.0)
WBC: 11.6 10*3/uL — ABNORMAL HIGH (ref 3.8–10.8)

## 2019-11-05 LAB — LIPID PANEL
Cholesterol: 150 mg/dL (ref <200)
HDL: 50 mg/dL (ref >=50)
LDL Cholesterol (Calc): 75 mg/dL (calc)
Non-HDL Cholesterol (Calc): 100 mg/dL (calc) (ref <130)
Total CHOL/HDL Ratio: 3 (calc) (ref <5.0)
Triglycerides: 155 mg/dL — ABNORMAL HIGH (ref <150)

## 2019-11-05 LAB — MICROALBUMIN / CREATININE URINE RATIO
Creatinine, Urine: 230 mg/dL (ref 20–275)
Microalb Creat Ratio: 18 ug/mg{creat}
Microalb, Ur: 4.1 mg/dL

## 2019-11-05 LAB — VITAMIN D 25 HYDROXY (VIT D DEFICIENCY, FRACTURES): Vit D, 25-Hydroxy: 18 ng/mL — ABNORMAL LOW (ref 30–100)

## 2019-11-05 LAB — TSH: TSH: 1.24 m[IU]/L

## 2019-11-08 ENCOUNTER — Encounter: Payer: Self-pay | Admitting: Family Medicine

## 2019-11-08 DIAGNOSIS — Z1231 Encounter for screening mammogram for malignant neoplasm of breast: Secondary | ICD-10-CM

## 2019-11-08 DIAGNOSIS — Z124 Encounter for screening for malignant neoplasm of cervix: Secondary | ICD-10-CM | POA: Insufficient documentation

## 2019-11-08 DIAGNOSIS — R030 Elevated blood-pressure reading, without diagnosis of hypertension: Secondary | ICD-10-CM | POA: Insufficient documentation

## 2019-11-08 DIAGNOSIS — E785 Hyperlipidemia, unspecified: Secondary | ICD-10-CM | POA: Insufficient documentation

## 2019-11-08 DIAGNOSIS — E559 Vitamin D deficiency, unspecified: Secondary | ICD-10-CM | POA: Insufficient documentation

## 2019-11-08 DIAGNOSIS — Z720 Tobacco use: Secondary | ICD-10-CM | POA: Insufficient documentation

## 2019-11-08 DIAGNOSIS — Z1211 Encounter for screening for malignant neoplasm of colon: Secondary | ICD-10-CM | POA: Insufficient documentation

## 2019-11-08 DIAGNOSIS — E1169 Type 2 diabetes mellitus with other specified complication: Secondary | ICD-10-CM | POA: Insufficient documentation

## 2019-11-08 DIAGNOSIS — I10 Essential (primary) hypertension: Secondary | ICD-10-CM | POA: Insufficient documentation

## 2019-11-08 HISTORY — DX: Encounter for screening for malignant neoplasm of cervix: Z12.4

## 2019-11-08 HISTORY — DX: Encounter for screening mammogram for malignant neoplasm of breast: Z12.31

## 2019-11-08 HISTORY — DX: Encounter for screening for malignant neoplasm of colon: Z12.11

## 2019-11-08 NOTE — Assessment & Plan Note (Signed)
   Michaela Aguilar is educated about the importance of exercise daily to help with weight management. A minumum of 30 minutes daily is recommended. Additionally, importance of healthy food choices  with portion control discussed.

## 2019-11-08 NOTE — Assessment & Plan Note (Signed)
Refilled albuterol today. No noted wheezing in office.

## 2019-11-08 NOTE — Assessment & Plan Note (Signed)
Colonoscopy Referral to GI placed today

## 2019-11-08 NOTE — Assessment & Plan Note (Signed)
Hyperlipidemia associated with type 2 diabetes.  Continue taking glipizide as well as Lipitor for cholesterol control.  Strongly encouraged her to make sure that she stays on a heart healthy low-fat diabetic friendly diet.  And to exercise was 30 minutes most days of the week.  Refills provided today in office.

## 2019-11-08 NOTE — Assessment & Plan Note (Signed)
Referral for Pap provided today

## 2019-11-08 NOTE — Assessment & Plan Note (Signed)
Michaela Aguilar is encouraged to check blood sugar daily as directed. Continue current medications. Need Up dated labs Is on statin as well. Educated on importance of maintain a well balanced diabetic friendly diet. She is reminded the importance of maintaining  good blood sugars,  taking medications as directed, daily foot care, annual eye exams. Additionally educated about keeping good control over blood pressure and cholesterol as well.

## 2019-11-08 NOTE — Assessment & Plan Note (Signed)
Reports that this is well controlled.  Does need refill on her inhaler but otherwise is doing well.  Refill provided today.  Strongly encouraged her to stop smoking.

## 2019-11-08 NOTE — Assessment & Plan Note (Signed)
Asked about quitting: confirms they are currently smokes cigarettes Advise to quit smoking: Educated about QUITTING to reduce the risk of cancer, cardio and cerebrovascular disease. Assess willingness: Unwilling to quit at this time, but is working on cutting back. Assist with counseling and pharmacotherapy: Counseled for 5 minutes and literature provided. Arrange for follow up:  not quitting follow up in 3 months and continue to offer help.   

## 2019-11-08 NOTE — Assessment & Plan Note (Signed)
Mammogram Ordered. 

## 2019-11-08 NOTE — Assessment & Plan Note (Signed)
Michaela Aguilar is encouraged to maintain a well balanced diet that is low in salt. Controlled, continue current medication regimen.  Additionally, she is also reminded that exercise is beneficial for heart health and control of  Blood pressure. 30-60 minutes daily is recommended-walking was suggested.

## 2019-11-08 NOTE — Assessment & Plan Note (Signed)
Needs labs checked. Ordered today.

## 2019-11-11 DIAGNOSIS — H524 Presbyopia: Secondary | ICD-10-CM | POA: Diagnosis not present

## 2019-11-12 ENCOUNTER — Other Ambulatory Visit: Payer: Self-pay | Admitting: Family Medicine

## 2019-11-12 DIAGNOSIS — E559 Vitamin D deficiency, unspecified: Secondary | ICD-10-CM

## 2019-11-12 MED ORDER — VITAMIN D (ERGOCALCIFEROL) 1.25 MG (50000 UNIT) PO CAPS: 50000.0000 [IU] | ORAL_CAPSULE | ORAL | 1 refills | Status: DC

## 2019-11-13 DIAGNOSIS — H524 Presbyopia: Secondary | ICD-10-CM | POA: Diagnosis not present

## 2019-11-13 DIAGNOSIS — H5213 Myopia, bilateral: Secondary | ICD-10-CM | POA: Diagnosis not present

## 2019-11-27 ENCOUNTER — Other Ambulatory Visit: Payer: Self-pay

## 2019-11-27 ENCOUNTER — Ambulatory Visit (HOSPITAL_COMMUNITY)
Admission: RE | Admit: 2019-11-27 | Discharge: 2019-11-27 | Disposition: A | Discharge status: Home / Self Care | Payer: Medicaid Other | Source: Ambulatory Visit | Attending: Family Medicine | Admitting: Family Medicine

## 2019-11-27 DIAGNOSIS — Z1231 Encounter for screening mammogram for malignant neoplasm of breast: Secondary | ICD-10-CM | POA: Diagnosis not present

## 2019-12-11 ENCOUNTER — Other Ambulatory Visit: Payer: Medicaid Other | Admitting: Family Medicine

## 2020-01-05 ENCOUNTER — Other Ambulatory Visit: Payer: Self-pay

## 2020-01-05 ENCOUNTER — Telehealth: Payer: Self-pay | Admitting: Urology

## 2020-01-05 DIAGNOSIS — N200 Calculus of kidney: Secondary | ICD-10-CM

## 2020-01-05 NOTE — Telephone Encounter (Signed)
Order placed

## 2020-01-05 NOTE — Telephone Encounter (Signed)
Patient called and rescheduled her upcoming appt with Dr Alyson Ingles to 03/02/20 because her Ultrasound has not been scheduled.. She needs ultrasound scheduled before 03/02/20 appt per pt.

## 2020-01-07 ENCOUNTER — Ambulatory Visit: Payer: Medicaid Other | Admitting: Urology

## 2020-01-15 ENCOUNTER — Other Ambulatory Visit: Payer: Self-pay

## 2020-01-15 ENCOUNTER — Telehealth: Payer: Self-pay

## 2020-01-15 DIAGNOSIS — E119 Type 2 diabetes mellitus without complications: Secondary | ICD-10-CM

## 2020-01-15 DIAGNOSIS — R030 Elevated blood-pressure reading, without diagnosis of hypertension: Secondary | ICD-10-CM

## 2020-01-15 MED ORDER — LISINOPRIL 10 MG PO TABS: 10.0000 mg | ORAL_TABLET | Freq: Every day | ORAL | 0 refills | Status: DC

## 2020-01-15 NOTE — Telephone Encounter (Signed)
Please call at [phone]

## 2020-01-15 NOTE — Telephone Encounter (Signed)
Pt is calling regarding BP medication --please call

## 2020-01-15 NOTE — Telephone Encounter (Signed)
Lisinopril refilled.

## 2020-02-03 ENCOUNTER — Other Ambulatory Visit: Payer: Medicaid Other | Admitting: Adult Health

## 2020-02-04 ENCOUNTER — Ambulatory Visit: Payer: Medicaid Other | Admitting: Family Medicine

## 2020-02-04 ENCOUNTER — Encounter: Payer: Self-pay | Admitting: Family Medicine

## 2020-02-04 ENCOUNTER — Ambulatory Visit (INDEPENDENT_AMBULATORY_CARE_PROVIDER_SITE_OTHER): Payer: Medicaid Other | Admitting: Family Medicine

## 2020-02-04 ENCOUNTER — Other Ambulatory Visit: Payer: Self-pay

## 2020-02-04 DIAGNOSIS — E1169 Type 2 diabetes mellitus with other specified complication: Secondary | ICD-10-CM

## 2020-02-04 DIAGNOSIS — R030 Elevated blood-pressure reading, without diagnosis of hypertension: Secondary | ICD-10-CM | POA: Diagnosis not present

## 2020-02-04 DIAGNOSIS — Z720 Tobacco use: Secondary | ICD-10-CM

## 2020-02-04 DIAGNOSIS — J441 Chronic obstructive pulmonary disease with (acute) exacerbation: Secondary | ICD-10-CM | POA: Diagnosis not present

## 2020-02-04 DIAGNOSIS — E559 Vitamin D deficiency, unspecified: Secondary | ICD-10-CM | POA: Diagnosis not present

## 2020-02-04 DIAGNOSIS — E119 Type 2 diabetes mellitus without complications: Secondary | ICD-10-CM

## 2020-02-04 DIAGNOSIS — E785 Hyperlipidemia, unspecified: Secondary | ICD-10-CM

## 2020-02-04 DIAGNOSIS — R062 Wheezing: Secondary | ICD-10-CM

## 2020-02-04 DIAGNOSIS — R110 Nausea: Secondary | ICD-10-CM | POA: Insufficient documentation

## 2020-02-04 DIAGNOSIS — E875 Hyperkalemia: Secondary | ICD-10-CM

## 2020-02-04 DIAGNOSIS — M549 Dorsalgia, unspecified: Secondary | ICD-10-CM

## 2020-02-04 MED ORDER — ATORVASTATIN CALCIUM 40 MG PO TABS: 40.0000 mg | ORAL_TABLET | Freq: Every day | ORAL | 3 refills | Status: DC

## 2020-02-04 MED ORDER — LISINOPRIL 10 MG PO TABS: 10.0000 mg | ORAL_TABLET | Freq: Every day | ORAL | 1 refills | Status: DC

## 2020-02-04 MED ORDER — FAMOTIDINE 20 MG PO TABS: 20.0000 mg | ORAL_TABLET | Freq: Two times a day (BID) | ORAL | 1 refills | Status: DC

## 2020-02-04 MED ORDER — GLIPIZIDE 10 MG PO TABS: 10.0000 mg | ORAL_TABLET | Freq: Two times a day (BID) | ORAL | 3 refills | Status: DC

## 2020-02-04 MED ORDER — LISINOPRIL 10 MG PO TABS: 10.0000 mg | ORAL_TABLET | Freq: Every day | ORAL | 0 refills | Status: DC

## 2020-02-04 MED ORDER — ALBUTEROL SULFATE HFA 108 (90 BASE) MCG/ACT IN AERS: 2.0000 | INHALATION_SPRAY | Freq: Four times a day (QID) | RESPIRATORY_TRACT | 1 refills | Status: DC | PRN

## 2020-02-04 MED ORDER — ONDANSETRON 4 MG PO TBDP: 4.0000 mg | ORAL_TABLET | Freq: Three times a day (TID) | ORAL | 0 refills | Status: DC | PRN

## 2020-02-04 NOTE — Patient Instructions (Signed)
I appreciate the opportunity to provide you with care for your health and wellness. Today we discussed: overall health   Follow up: 4 months  Fasting labs today  Referral for Dry needling   Refill of meds  Continue the good work on weight loss. :)  Please continue to practice social distancing to keep you, your family, and our community safe.  If you must go out, please wear a mask and practice good handwashing.  It was a pleasure to see you and I look forward to continuing to work together on your health and well-being. Please do not hesitate to call the office if you need care or have questions about your care.  Have a wonderful day and week. With Gratitude, Cherly Beach, DNP, AGNP-BC

## 2020-02-04 NOTE — Assessment & Plan Note (Signed)
Hyperlipidemia associated with type 2 diabetes.  Continue taking glipizide as well as Lipitor for cholesterol control.  Strongly encouraged to make sure that she stays on a heart healthy low-fat diabetic friendly diet.  And to exercise was 30 minutes on most days of the week.  Refills provided in office today.

## 2020-02-04 NOTE — Assessment & Plan Note (Signed)
No noted wheezing today in the office.  Albuterol inhaler refilled today.

## 2020-02-04 NOTE — Assessment & Plan Note (Signed)
Will be getting updated labs.  Continue current medications.  Is on a statin medication advised to continue that.  Educated on the importance of maintaining a well-balanced diabetic friendly diet.

## 2020-02-04 NOTE — Progress Notes (Signed)
Subjective:  Patient ID: Michaela Aguilar, female    DOB: 07-14-1966  Age: 54 y.o. MRN: [id number]  CC:  Chief Complaint  Patient presents with  . Neck Pain    right sided neck pain down right shoulder blade up into the back of neck       HPI  HPI  Michaela Aguilar is a 54 year old female patient of mine.  Who presents today for refill of medications in addition to complaints of neck pain.  Reports that she is can be going out of town in the next couple days for funeral and will run out of her medications at that time and would like to have them refilled so that she does not have to call while she is out of town.  Additionally she would like to get her updated lab work as we discussed previously last year she has not had any labs drawn in a while.  And has been working on her diet.  She reports taking all her medications without any issues.  And overall she feels like she is doing well.  She denies having any chest pain, leg swelling, shortness of breath, dizziness, headaches, vision changes or any other signs or symptoms of elevated blood pressure.  She denies having any trouble with blood sugar levels feeling like they are dropping or going up high.  Denies having any polydipsia, polyuria, polyaphasia.  Neck pain: Reports it has been coming and going for the last few months.  She did have this discomfort on her left side and had dry needling and it went away.  She now has it on the back part of her neck at the right and into her upper back.  She has discomfort when she turns her head side to side.  She thinks that the weather impacted a little bit more.  And she described as a aching pain.  Laying down seems to hurt her more at night versus her being upright.  She is not really tried anything to relieve it wanted to know what my opinion was and what she could do.  Timing of when it occurs very still when it does occur it is an 8 out of 10.  She is open for referral to dry needling/physical therapy.  She  denies having any injury or trauma to the site.   Today patient denies signs and symptoms of COVID 19 infection including fever, chills, cough, shortness of breath, and headache. Past Medical, Surgical, Social History, Allergies, and Medications have been Reviewed.   Past Medical History:  Diagnosis Date  . Anxiety   . Bronchitis   . Chronic pain of left knee 04/03/2017  . Chronic pain of right knee 08/16/2017  . Depression   . Encounter for screening for malignant neoplasm of cervix 11/08/2019  . Encounter for screening for malignant neoplasm of colon 11/08/2019  . Encounter for screening mammogram for malignant neoplasm of breast 11/08/2019  . Gestational diabetes    1992  . History of kidney stones   . Kidney stones per pt.  . Low back pain 11/17/2015  . Neck pain 11/17/2015    Current Meds  Medication Sig  . albuterol (VENTOLIN HFA) 108 (90 Base) MCG/ACT inhaler Inhale 2 puffs into the lungs every 6 (six) hours as needed for wheezing or shortness of breath.  Marland Kitchen atorvastatin (LIPITOR) 40 MG tablet Take 1 tablet (40 mg total) by mouth daily.  Marland Kitchen glipiZIDE (GLUCOTROL) 10 MG tablet Take 1 tablet (10  mg total) by mouth 2 (two) times daily before a meal.  . lisinopril (ZESTRIL) 10 MG tablet Take 1 tablet (10 mg total) by mouth daily.  . Multiple Vitamins-Minerals (MULTIVITAMIN WITH MINERALS) tablet Take 1 tablet by mouth daily.  . Omega-3 Fatty Acids (FISH OIL) 1000 MG CAPS Take 1 capsule by mouth in the morning and at bedtime.  . ondansetron (ZOFRAN ODT) 4 MG disintegrating tablet Take 1 tablet (4 mg total) by mouth every 8 (eight) hours as needed for nausea or vomiting.  . Vitamin D, Ergocalciferol, (DRISDOL) 1.25 MG (50000 UT) CAPS capsule Take 1 capsule (50,000 Units total) by mouth every 7 (seven) days.  . [DISCONTINUED] albuterol (VENTOLIN HFA) 108 (90 Base) MCG/ACT inhaler Inhale 2 puffs into the lungs every 6 (six) hours as needed for wheezing or shortness of breath.  .  [DISCONTINUED] atorvastatin (LIPITOR) 40 MG tablet Take 1 tablet (40 mg total) by mouth daily.  . [DISCONTINUED] glipiZIDE (GLUCOTROL) 10 MG tablet Take 1 tablet (10 mg total) by mouth 2 (two) times daily before a meal.  . [DISCONTINUED] lisinopril (ZESTRIL) 10 MG tablet Take 1 tablet (10 mg total) by mouth daily.  . [DISCONTINUED] lisinopril (ZESTRIL) 10 MG tablet Take 1 tablet (10 mg total) by mouth daily.  . [DISCONTINUED] ondansetron (ZOFRAN ODT) 4 MG disintegrating tablet Take 1 tablet (4 mg total) by mouth every 8 (eight) hours as needed for nausea or vomiting.    ROS:  Review of Systems  Constitutional: Negative.   HENT: Negative.   Eyes: Negative.   Respiratory: Negative.   Cardiovascular: Negative.   Gastrointestinal: Negative.   Genitourinary: Negative.   Musculoskeletal: Positive for back pain and neck pain.  Skin: Negative.   Neurological: Negative.   Endo/Heme/Allergies: Negative.   Psychiatric/Behavioral: Negative.   All other systems reviewed and are negative.    Objective:   Today's Vitals: BP 108/70   Pulse 72   Temp (!) 97.1 F (36.2 C) (Temporal)   Resp 15   Ht 5\' 5"  (1.651 m)   Wt 215 lb (97.5 kg)   LMP 02/16/2014   SpO2 96%   BMI 35.78 kg/m  Vitals with BMI 02/04/2020 11/04/2019 08/18/2019  Height 5\' 5"  5\' 5"  -  Weight 215 lbs 231 lbs 2 oz -  BMI XX123456 99991111 -  Systolic 123XX123 0000000 99991111  Diastolic 70 78 75  Pulse 72 74 67     Physical Exam Vitals and nursing note reviewed.  Constitutional:      Appearance: Normal appearance. She is well-developed and well-groomed. She is obese.  HENT:     Head: Normocephalic and atraumatic.     Right Ear: External ear normal.     Left Ear: External ear normal.     Nose: Nose normal.     Mouth/Throat:     Mouth: Mucous membranes are moist.     Pharynx: Oropharynx is clear.  Eyes:     General:        Right eye: No discharge.        Left eye: No discharge.     Conjunctiva/sclera: Conjunctivae normal.   Cardiovascular:     Rate and Rhythm: Normal rate and regular rhythm.     Pulses: Normal pulses.     Heart sounds: Normal heart sounds.  Pulmonary:     Effort: Pulmonary effort is normal.     Breath sounds: Normal breath sounds.  Musculoskeletal:        General: Normal range of motion.  Cervical back: Normal range of motion and neck supple.     Comments: Tightness noticed at the base of the cervical spine and trapezius.  Skin:    General: Skin is warm.  Neurological:     General: No focal deficit present.     Mental Status: She is alert and oriented to person, place, and time.  Psychiatric:        Attention and Perception: Attention normal.        Mood and Affect: Mood normal.        Speech: Speech normal.        Behavior: Behavior normal. Behavior is cooperative.        Thought Content: Thought content normal.        Cognition and Memory: Cognition normal.        Judgment: Judgment normal.    Assessment   1. Morbid obesity ([place])   2. Diabetes mellitus without complication ([place])   3. Hyperlipidemia associated with type 2 diabetes mellitus ([place])   4. COPD with acute exacerbation ([place])   5. Pre-hypertension   6. Nausea   7. Upper back pain on right side   8. Vitamin D deficiency   9. Wheezing   10. Nicotine abuse     Tests ordered Orders Placed This Encounter  Procedures  . CBC  . COMPLETE METABOLIC PANEL WITH GFR  . Hemoglobin A1c  . Lipid panel  . VITAMIN D 25 Hydroxy (Vit-D Deficiency, Fractures)  . Ambulatory referral to Physical Therapy     Plan: Please see assessment and plan per problem list above.   Meds ordered this encounter  Medications  . glipiZIDE (GLUCOTROL) 10 MG tablet    Sig: Take 1 tablet (10 mg total) by mouth 2 (two) times daily before a meal.    Dispense:  60 tablet    Refill:  3    Order Specific Question:   Supervising Provider    Answer:   SIMPSON, MARGARET E [id number]  . atorvastatin (LIPITOR) 40 MG tablet    Sig: Take 1 tablet  (40 mg total) by mouth daily.    Dispense:  30 tablet    Refill:  3    Order Specific Question:   Supervising Provider    Answer:   SIMPSON, MARGARET E [id number]  . DISCONTD: lisinopril (ZESTRIL) 10 MG tablet    Sig: Take 1 tablet (10 mg total) by mouth daily.    Dispense:  30 tablet    Refill:  0    Order Specific Question:   Supervising Provider    Answer:   SIMPSON, MARGARET E [id number]  . ondansetron (ZOFRAN ODT) 4 MG disintegrating tablet    Sig: Take 1 tablet (4 mg total) by mouth every 8 (eight) hours as needed for nausea or vomiting.    Dispense:  20 tablet    Refill:  0    Order Specific Question:   Supervising Provider    Answer:   SIMPSON, MARGARET E [id number]  . famotidine (PEPCID) 20 MG tablet    Sig: Take 1 tablet (20 mg total) by mouth 2 (two) times daily.    Dispense:  30 tablet    Refill:  1    Order Specific Question:   Supervising Provider    Answer:   SIMPSON, MARGARET E [id number]  . lisinopril (ZESTRIL) 10 MG tablet    Sig: Take 1 tablet (10 mg total) by mouth daily.    Dispense:  90 tablet    Refill:  1    3 month supply and one refill    Order Specific Question:   Supervising Provider    Answer:   SIMPSON, MARGARET E [id number]  . albuterol (VENTOLIN HFA) 108 (90 Base) MCG/ACT inhaler    Sig: Inhale 2 puffs into the lungs every 6 (six) hours as needed for wheezing or shortness of breath.    Dispense:  8.5 g    Refill:  1    Please dispense 1 inhaler    Order Specific Question:   Supervising Provider    Answer:   Fayrene Helper [id number]    Patient to follow-up in 05/26/2020  35 minutes of face-to-face time. Perlie Mayo, NP

## 2020-02-04 NOTE — Assessment & Plan Note (Signed)
Controlled, refill on her inhaler but otherwise doing well.  Refill provided today.  Strongly encouraged her to stop smoking.

## 2020-02-04 NOTE — Assessment & Plan Note (Signed)
Will be starting Pepcid, but provided with refill of Zofran.  If Pepcid does not work we will look at doing a GI consult as ongoing use of Zofran is not recommended.  Reviewed side effects, risks and benefits of medication.   Patient acknowledged agreement and understanding of the plan. Marland Kitchen

## 2020-02-04 NOTE — Assessment & Plan Note (Signed)
Morbid obesity is linked to diabetes, hyperlipidemia, hypertension.  She is encouraged to continue her weight loss as she has lost some weight in the last couple months.  And has been working on lifestyle and diet changes. Improved  Wt Readings from Last 3 Encounters:  02/04/20 215 lb (97.5 kg)  11/04/19 231 lb 1.9 oz (104.8 kg)  08/18/19 229 lb (103.9 kg)

## 2020-02-04 NOTE — Assessment & Plan Note (Signed)
Asked about quitting: confirms they are currently smokes cigarettes Advise to quit smoking: Educated about QUITTING to reduce the risk of cancer, cardio and cerebrovascular disease. Assess willingness: Unwilling to quit at this time, but is working on cutting back. Assist with counseling and pharmacotherapy: Counseled for 5 minutes and literature provided. Arrange for follow up:  not quitting follow up in 3 months and continue to offer help.   

## 2020-02-04 NOTE — Assessment & Plan Note (Signed)
Will be getting updated labs to see if she continues to need the prescription dose versus if she can switch to over-the-counter.

## 2020-02-04 NOTE — Assessment & Plan Note (Signed)
Advised to continue lisinopril secondary to diabetes.  Encouraged ongoing continued weight loss.  DASH diet and exercise is recommended.  At least 30 to 60 minutes daily at least 5 days a week.

## 2020-02-05 ENCOUNTER — Other Ambulatory Visit: Payer: Self-pay | Admitting: Family Medicine

## 2020-02-05 DIAGNOSIS — E875 Hyperkalemia: Secondary | ICD-10-CM

## 2020-02-05 LAB — LIPID PANEL
Cholesterol: 120 mg/dL (ref <200)
HDL: 44 mg/dL — ABNORMAL LOW (ref >=50)
LDL Cholesterol (Calc): 54 mg/dL (calc)
Non-HDL Cholesterol (Calc): 76 mg/dL (calc) (ref <130)
Total CHOL/HDL Ratio: 2.7 (calc) (ref <5.0)
Triglycerides: 139 mg/dL (ref <150)

## 2020-02-05 LAB — CBC
HCT: 45.3 % — ABNORMAL HIGH (ref 35.0–45.0)
Hemoglobin: 15.4 g/dL (ref 11.7–15.5)
MCH: 32.5 pg (ref 27.0–33.0)
MCHC: 34 g/dL (ref 32.0–36.0)
MCV: 95.6 fL (ref 80.0–100.0)
MPV: 10.3 fL (ref 7.5–12.5)
Platelets: 341 10*3/uL (ref 140–400)
RBC: 4.74 10*6/uL (ref 3.80–5.10)
RDW: 12.3 % (ref 11.0–15.0)
WBC: 9.8 10*3/uL (ref 3.8–10.8)

## 2020-02-05 LAB — COMPLETE METABOLIC PANEL WITH GFR
AG Ratio: 1.8 (calc) (ref 1.0–2.5)
ALT: 32 U/L — ABNORMAL HIGH (ref 6–29)
AST: 21 U/L (ref 10–35)
Albumin: 4.7 g/dL (ref 3.6–5.1)
Alkaline phosphatase (APISO): 92 U/L (ref 37–153)
BUN: 10 mg/dL (ref 7–25)
CO2: 30 mmol/L (ref 20–32)
Calcium: 10.8 mg/dL — ABNORMAL HIGH (ref 8.6–10.4)
Chloride: 103 mmol/L (ref 98–110)
Creat: 0.68 mg/dL (ref 0.50–1.05)
GFR, Est African American: 113 mL/min/{1.73_m2} (ref >=60)
GFR, Est Non African American: 98 mL/min/{1.73_m2} (ref >=60)
Globulin: 2.6 g/dL (calc) (ref 1.9–3.7)
Glucose, Bld: 126 mg/dL — ABNORMAL HIGH (ref 65–99)
Potassium: 5.5 mmol/L — ABNORMAL HIGH (ref 3.5–5.3)
Sodium: 138 mmol/L (ref 135–146)
Total Bilirubin: 0.8 mg/dL (ref 0.2–1.2)
Total Protein: 7.3 g/dL (ref 6.1–8.1)

## 2020-02-05 LAB — HEMOGLOBIN A1C
Hgb A1c MFr Bld: 5.6 % of total Hgb (ref <5.7)
Mean Plasma Glucose: 114 (calc)
eAG (mmol/L): 6.3 (calc)

## 2020-02-05 LAB — VITAMIN D 25 HYDROXY (VIT D DEFICIENCY, FRACTURES): Vit D, 25-Hydroxy: 71 ng/mL (ref 30–100)

## 2020-02-05 MED ORDER — SODIUM POLYSTYRENE SULFONATE 15 GM/60ML PO SUSP: 30.0000 g | Freq: Two times a day (BID) | ORAL | 0 refills | Status: AC

## 2020-02-05 MED ORDER — GLIPIZIDE 5 MG PO TABS: 5.0000 mg | ORAL_TABLET | Freq: Every day | ORAL | 5 refills | Status: DC

## 2020-02-05 NOTE — Addendum Note (Signed)
Addended by: Eual Fines on: 02/05/2020 09:35 AM   Modules accepted: Orders

## 2020-02-05 NOTE — Addendum Note (Signed)
Addended by: Eual Fines on: 02/05/2020 09:59 AM   Modules accepted: Orders

## 2020-02-06 DIAGNOSIS — E875 Hyperkalemia: Secondary | ICD-10-CM | POA: Diagnosis not present

## 2020-02-06 LAB — POTASSIUM: Potassium: 4.9 mmol/L (ref 3.5–5.3)

## 2020-02-11 ENCOUNTER — Ambulatory Visit (HOSPITAL_COMMUNITY): Payer: Medicaid Other | Attending: Family Medicine | Admitting: Physical Therapy

## 2020-02-11 ENCOUNTER — Other Ambulatory Visit: Payer: Self-pay

## 2020-02-11 ENCOUNTER — Encounter (HOSPITAL_COMMUNITY): Payer: Self-pay | Admitting: Physical Therapy

## 2020-02-11 DIAGNOSIS — R293 Abnormal posture: Secondary | ICD-10-CM

## 2020-02-11 DIAGNOSIS — M542 Cervicalgia: Secondary | ICD-10-CM | POA: Diagnosis not present

## 2020-02-11 NOTE — Patient Instructions (Signed)

## 2020-02-11 NOTE — Therapy (Signed)
[place], [place], [address] Phone: [phone]   Fax:  [phone]  Physical Therapy Evaluation  Patient Details  Name: Michaela Aguilar MRN: [id number] Date of Birth: 07/31/1966 Referring Provider (PT): Cherly Beach NP   Encounter Date: 02/11/2020  PT End of Session - 02/11/20 0956    Visit Number  1    Number of Visits  8    Date for PT Re-Evaluation  03/12/20    Authorization Type  Medicaid    Authorization Time Period  02/11/20-03/12/20 (submitted for 3 visits on 02/11/20)    Authorization - Visit Number  1    Authorization - Number of Visits  1    Progress Note Due on Visit  4    PT Start Time  0905    PT Stop Time  0940    PT Time Calculation (min)  35 min    Activity Tolerance  Patient tolerated treatment well    Behavior During Therapy  [facility] for tasks assessed/performed       Past Medical History:  Diagnosis Date  . Anxiety   . Bronchitis   . Chronic pain of left knee 04/03/2017  . Chronic pain of right knee 08/16/2017  . Depression   . Encounter for screening for malignant neoplasm of cervix 11/08/2019  . Encounter for screening for malignant neoplasm of colon 11/08/2019  . Encounter for screening mammogram for malignant neoplasm of breast 11/08/2019  . Gestational diabetes    1992  . History of kidney stones   . Kidney stones per pt.  . Low back pain 11/17/2015  . Neck pain 11/17/2015    Past Surgical History:  Procedure Laterality Date  . CESAREAN SECTION     x2  . CYSTOSCOPY WITH RETROGRADE PYELOGRAM, URETEROSCOPY AND STENT PLACEMENT Right 08/18/2019   Procedure: CYSTOSCOPY WITH RETROGRADE PYELOGRAM, URETEROSCOPY AND STENT PLACEMENT;  Surgeon: Cleon Gustin, MD;  Location: [facility];  Service: Urology;  Laterality: Right;  . CYSTOSCOPY WITH STENT PLACEMENT Right 09/06/2016   Procedure: CYSTOSCOPY WITH RIGHT URETERAL STENT PLACEMENT;  Surgeon: Cleon Gustin, MD;  Location: [facility];  Service:  Urology;  Laterality: Right;  . CYSTOSCOPY/RETROGRADE/URETEROSCOPY/STONE EXTRACTION WITH BASKET Right 09/06/2016   Procedure: CYSTOSCOPY/RIGHT RETROGRADE PYELOGRAM, RIGHT RENAL STONE EXTRACTION WITH LASER;  Surgeon: Cleon Gustin, MD;  Location: [facility];  Service: Urology;  Laterality: Right;  . [place] 2010  . HERNIA REPAIR     incisional hernia  . STONE EXTRACTION WITH BASKET Right 08/18/2019   Procedure: STONE EXTRACTION WITH BASKET;  Surgeon: Cleon Gustin, MD;  Location: [facility];  Service: Urology;  Laterality: Right;    There were no vitals filed for this visit.   Subjective Assessment - 02/11/20 0911    Subjective  Patient presents to physical therapy with complaint of RT neck and upper back/ scapular pain. Patient says she has had pain like this off and on for several years and has had similar pain in opposite side, but says most recent flare up of pain began about 3 weeks ago. Patient describes pain as insidious. Patient says she had dry needling in therapy about 5 years ago for her LT side which was very helpful. Patient says pain is somewhat better since recent flare up 3 weeks ago. Patient says she has been managing sx with heat and Tylenol PRN.    Limitations  Sitting;Reading;Lifting;House hold activities    Diagnostic tests  xrays    Patient Stated Goals  try to relieve the pain    Currently in Pain?  Yes    Pain Score  4     Pain Location  Neck    Pain Orientation  Right    Pain Descriptors / Indicators  Throbbing;Sharp;Aching;Shooting    Pain Type  Chronic pain    Pain Onset  More than a month ago    Pain Frequency  Constant    Aggravating Factors   turning head, driving, prolonged sitting, laying down    Pain Relieving Factors  movement, stretching, heat    Effect of Pain on Daily Activities  limit         [facility] PT Assessment - 02/11/20 0001      Assessment   Medical Diagnosis  upper right side back pain     Referring  Provider (PT)  Cherly Beach NP    Onset Date/Surgical Date  --   chronic, over a year   Next MD Visit  05/26/20    Prior Therapy  yes      Precautions   Precautions  None      Restrictions   Weight Bearing Restrictions  No      Home Environment   Living Environment  Private residence    Living Arrangements  Non-relatives/Friends      Prior Function   Level of Independence  Independent      Cognition   Overall Cognitive Status  Within Functional Limits for tasks assessed      Sensation   Light Touch  Appears Intact      Posture/Postural Control   Posture/Postural Control  Postural limitations    Postural Limitations  Rounded Shoulders;Forward head      ROM / Strength   AROM / PROM / Strength  AROM;Strength      AROM   Overall AROM Comments  Bilateral shoulder AROM WFL except min restriciton in bilateral elevation, min restriciton in RT shoulder IR     AROM Assessment Site  Cervical    Cervical Flexion  WFL    Cervical Extension  35   discomfort on RT   Cervical - Right Side Bend  14   discomfort on RT    Cervical - Left Side Bend  22    Cervical - Right Rotation  64    Cervical - Left Rotation  61      Strength   Strength Assessment Site  Shoulder    Right/Left Shoulder  Right;Left    Right Shoulder Flexion  4+/5    Right Shoulder ABduction  4+/5    Right Shoulder External Rotation  5/5    Left Shoulder Flexion  5/5    Left Shoulder ABduction  5/5    Left Shoulder External Rotation  5/5      Flexibility   Soft Tissue Assessment /Muscle Length  yes   min restriciton in LT upper trap, mod restriction in RT UT     Palpation   Palpation comment  Mod tenderness to palpation with noted trigger point about RT upper trap/ levator insertion point                 Objective measurements completed on examination: See above findings.              PT Education - 02/11/20 0915    Education Details  On evaluation findings, benefits of DN and POC     Person(s) Educated  Patient  Methods  Explanation;Handout    Comprehension  Verbalized understanding       PT Short Term Goals - 02/11/20 1003      PT SHORT TERM GOAL #1   Title  Patient will be independent with initial HEP to improve functional outcomes    Time  2    Period  Weeks    Status  New    Target Date  02/27/20        PT Long Term Goals - 02/11/20 1003      PT LONG TERM GOAL #1   Title  Patient will report at least 75% overall improvement in subjective complaint to indicate improvement in ability to perform ADLs.    Time  4    Period  Weeks    Status  New    Target Date  03/12/20      PT LONG TERM GOAL #2   Title  Patient will improve cervical rotation AROM to at least 65 degrees pain free bilaterally to improve ability to scan environment for safety and while driving.    Time  4    Period  Weeks    Status  New    Target Date  03/12/20      PT LONG TERM GOAL #3   Title  Patient will be able to sleep through the night with no disturbance due to increased neck pain for improved quality of life.    Time  4    Period  Weeks    Status  New    Target Date  03/12/20             Plan - 02/11/20 0957    Clinical Impression Statement  Patient is a 54 y.o. female who presents to physical therapy with complaint of Rt side neck and upper back pain. Patient demonstrates decreased strength, ROM restriction, reduced flexibility, increased tenderness to palpation and postural abnormalities which are likely contributing to symptoms of pain and are negatively impacting patient ability to perform ADLs. Patient will benefit from skilled physical therapy services to address these deficits to reduce pain and improve level of function with ADLs    Examination-Activity Limitations  Lift;Carry;Reach Overhead;Dressing;Sleep;Hygiene/Grooming    Examination-Participation Restrictions  Yard Work;Community Activity;Driving    Stability/Clinical Decision Making  Stable/Uncomplicated     Clinical Decision Making  Low    Rehab Potential  Good    PT Frequency  2x / week    PT Duration  4 weeks    PT Treatment/Interventions  ADLs/Self Care Home Management;Aquatic Therapy;Biofeedback;Cryotherapy;Electrical Stimulation;Therapeutic exercise;Contrast Bath;Therapeutic activities;Parrafin;Fluidtherapy;Ultrasound;Traction;Moist Heat;Iontophoresis 4mg /ml Dexamethasone;DME Instruction;Neuromuscular re-education;Manual techniques;Manual lymph drainage;Compression bandaging;Orthotic Fit/Training;Patient/family education;Vasopneumatic Device;Taping;Splinting;Joint Manipulations;Energy conservation;Spinal Manipulations;Dry needling;Passive range of motion    PT Next Visit Plan  Review goals, issue HEP. Initiate dry needling treatment. Progress ther ex with focus on postural strengthening    PT Home Exercise Plan  Issue at next visit    Consulted and Agree with Plan of Care  Patient       Patient will benefit from skilled therapeutic intervention in order to improve the following deficits and impairments:  Improper body mechanics, Pain, Decreased mobility, Postural dysfunction, Decreased activity tolerance, Decreased range of motion, Decreased strength, Hypomobility, Impaired flexibility  Visit Diagnosis: Cervicalgia  Abnormal posture     Problem List Patient Active Problem List   Diagnosis Date Noted  . Nausea 02/04/2020  . Upper back pain on right side 02/04/2020  . Vitamin D deficiency 11/08/2019  . Pre-hypertension 11/08/2019  . Hyperlipidemia associated  with type 2 diabetes mellitus ([place]) 11/08/2019  . Nicotine abuse 11/08/2019  . Wheezing 06/13/2018  . COPD with acute exacerbation ([place]) 06/13/2018  . Diabetes mellitus without complication ([place]) [id number]  . Morbid obesity ([place]) 10/26/2016   10:11 AM, 02/11/20 Josue Hector PT DPT  Physical Therapist with [facility]  [phone] [place] [address] [place], [place], [address] Phone: [phone]   Fax:  [phone]  Name: Michaela Aguilar MRN: [id number] Date of Birth: [birth year]

## 2020-02-18 ENCOUNTER — Ambulatory Visit (HOSPITAL_COMMUNITY): Payer: Medicaid Other | Attending: Family Medicine | Admitting: Physical Therapy

## 2020-02-18 ENCOUNTER — Other Ambulatory Visit: Payer: Self-pay

## 2020-02-18 ENCOUNTER — Encounter (HOSPITAL_COMMUNITY): Payer: Self-pay | Admitting: Physical Therapy

## 2020-02-18 DIAGNOSIS — M542 Cervicalgia: Secondary | ICD-10-CM | POA: Diagnosis not present

## 2020-02-18 DIAGNOSIS — R293 Abnormal posture: Secondary | ICD-10-CM | POA: Diagnosis not present

## 2020-02-18 NOTE — Therapy (Signed)
[place], [place], [address] Phone: [phone]   Fax:  [phone]  Physical Therapy Treatment  Patient Details  Name: Michaela Aguilar MRN: [id number] Date of Birth: [birth year] Referring Provider (PT): Cherly Beach NP   Encounter Date: 02/18/2020  PT End of Session - 02/18/20 1001    Visit Number  2    Number of Visits  8    Date for PT Re-Evaluation  03/12/20    Authorization Type  Medicaid    Authorization Time Period  02/11/20-03/12/20 3 visits approved 4-5 to 4-18    Authorization - Visit Number  1    Authorization - Number of Visits  3    Progress Note Due on Visit  4    PT Start Time  1001    PT Stop Time  1039    PT Time Calculation (min)  38 min    Activity Tolerance  Patient tolerated treatment well    Behavior During Therapy  [facility] for tasks assessed/performed       Past Medical History:  Diagnosis Date  . Anxiety   . Bronchitis   . Chronic pain of left knee 04/03/2017  . Chronic pain of right knee 08/16/2017  . Depression   . Encounter for screening for malignant neoplasm of cervix 11/08/2019  . Encounter for screening for malignant neoplasm of colon 11/08/2019  . Encounter for screening mammogram for malignant neoplasm of breast 11/08/2019  . Gestational diabetes    1992  . History of kidney stones   . Kidney stones per pt.  . Low back pain 11/17/2015  . Neck pain 11/17/2015    Past Surgical History:  Procedure Laterality Date  . CESAREAN SECTION     x2  . CYSTOSCOPY WITH RETROGRADE PYELOGRAM, URETEROSCOPY AND STENT PLACEMENT Right 08/18/2019   Procedure: CYSTOSCOPY WITH RETROGRADE PYELOGRAM, URETEROSCOPY AND STENT PLACEMENT;  Surgeon: Cleon Gustin, MD;  Location: [facility];  Service: Urology;  Laterality: Right;  . CYSTOSCOPY WITH STENT PLACEMENT Right 09/06/2016   Procedure: CYSTOSCOPY WITH RIGHT URETERAL STENT PLACEMENT;  Surgeon: Cleon Gustin, MD;  Location: [facility];  Service:  Urology;  Laterality: Right;  . CYSTOSCOPY/RETROGRADE/URETEROSCOPY/STONE EXTRACTION WITH BASKET Right 09/06/2016   Procedure: CYSTOSCOPY/RIGHT RETROGRADE PYELOGRAM, RIGHT RENAL STONE EXTRACTION WITH LASER;  Surgeon: Cleon Gustin, MD;  Location: [facility];  Service: Urology;  Laterality: Right;  . [place] 2010  . HERNIA REPAIR     incisional hernia  . STONE EXTRACTION WITH BASKET Right 08/18/2019   Procedure: STONE EXTRACTION WITH BASKET;  Surgeon: Cleon Gustin, MD;  Location: [facility];  Service: Urology;  Laterality: Right;    There were no vitals filed for this visit.  Subjective Assessment - 02/18/20 1001    Subjective  States pain has eased up a little bit and she can move neck better but afraid it will come back with the rain. Today she has 0/10 pain. Hurts at night. States once she goes to sleep she is ok. States that trying to get it to go to sleep is the problem. Ache on the right side. States that she thinks she needs another neck pillow. States she is typically a right-side sleeper. States she feels like she needs something to hold her head up by the end the day.    Limitations  Sitting;Reading;Lifting;House hold activities    Diagnostic tests  xrays    Patient Stated Goals  try to  relieve the pain    Currently in Pain?  No/denies    Pain Onset  More than a month ago         [facility] PT Assessment - 02/18/20 0001      Assessment   Medical Diagnosis  upper right side back pain     Referring Provider (PT)  Cherly Beach NP    Next MD Visit  05/26/20    Prior Therapy  yes                   [facility] PT Treatment/Exercise - 02/18/20 0001      Exercises   Exercises  Neck      Neck Exercises: Seated   Other Seated Exercise  posture holds x5, 60" holds    Other Seated Exercise  scaular retration 2x15 5" holds       Manual Therapy   Manual Therapy  Soft tissue mobilization;Joint mobilization    Manual therapy comments   all manual interventions performed independently of other interventions    Joint Mobilization  gentle medial glides to right cervical spine grade II - not tolerated well     Soft tissue mobilization  STM to R UT/levator/SCM       Trigger Point Dry Needling - 02/18/20 0001    Consent Given?  Yes    Education Handout Provided  Previously provided    Muscles Treated Head and Neck  Upper trapezius    Muscles Treated Upper Quadrant  Infraspinatus    Upper Trapezius Response  Palpable increased muscle length   R UT - 2 needles - not left in   Infraspinatus Response  Palpable increased muscle length   2 needles - left in for 5 minutes total with twisting          PT Education - 02/18/20 1234    Education Details  on DN risks and benefits associated with treatment.    Person(s) Educated  Patient    Methods  Explanation    Comprehension  Verbalized understanding       PT Short Term Goals - 02/11/20 1003      PT SHORT TERM GOAL #1   Title  Patient will be independent with initial HEP to improve functional outcomes    Time  2    Period  Weeks    Status  New    Target Date  02/27/20        PT Long Term Goals - 02/11/20 1003      PT LONG TERM GOAL #1   Title  Patient will report at least 75% overall improvement in subjective complaint to indicate improvement in ability to perform ADLs.    Time  4    Period  Weeks    Status  New    Target Date  03/12/20      PT LONG TERM GOAL #2   Title  Patient will improve cervical rotation AROM to at least 65 degrees pain free bilaterally to improve ability to scan environment for safety and while driving.    Time  4    Period  Weeks    Status  New    Target Date  03/12/20      PT LONG TERM GOAL #3   Title  Patient will be able to sleep through the night with no disturbance due to increased neck pain for improved quality of life.    Time  4    Period  Weeks  Status  New    Target Date  03/12/20            Plan - 02/18/20  1001    Clinical Impression Statement  Initiated dry needling on this date. No muscle twitch noted but palpable increase in muscle length noted and patient reported she felt more relaxed post treatment session. Will follow up with patient about post treatment soreness next session and continue with mobility/strengthening exercises.    Examination-Activity Limitations  Lift;Carry;Reach Overhead;Dressing;Sleep;Hygiene/Grooming    Examination-Participation Restrictions  Yard Work;Community Activity;Driving    Stability/Clinical Decision Making  Stable/Uncomplicated    Rehab Potential  Good    PT Frequency  2x / week    PT Duration  4 weeks    PT Treatment/Interventions  ADLs/Self Care Home Management;Aquatic Therapy;Biofeedback;Cryotherapy;Electrical Stimulation;Therapeutic exercise;Contrast Bath;Therapeutic activities;Parrafin;Fluidtherapy;Ultrasound;Traction;Moist Heat;Iontophoresis 4mg /ml Dexamethasone;DME Instruction;Neuromuscular re-education;Manual techniques;Manual lymph drainage;Compression bandaging;Orthotic Fit/Training;Patient/family education;Vasopneumatic Device;Taping;Splinting;Joint Manipulations;Energy conservation;Spinal Manipulations;Dry needling;Passive range of motion    PT Next Visit Plan  posture strengthening, neck mobility, thoracic mobility    PT Home Exercise Plan  4/7 scapular retraction, posture holds    Consulted and Agree with Plan of Care  Patient       Patient will benefit from skilled therapeutic intervention in order to improve the following deficits and impairments:  Improper body mechanics, Pain, Decreased mobility, Postural dysfunction, Decreased activity tolerance, Decreased range of motion, Decreased strength, Hypomobility, Impaired flexibility  Visit Diagnosis: Cervicalgia  Abnormal posture     Problem List Patient Active Problem List   Diagnosis Date Noted  . Nausea 02/04/2020  . Upper back pain on right side 02/04/2020  . Vitamin D deficiency  11/08/2019  . Pre-hypertension 11/08/2019  . Hyperlipidemia associated with type 2 diabetes mellitus ([place]) 11/08/2019  . Nicotine abuse 11/08/2019  . Wheezing 06/13/2018  . COPD with acute exacerbation ([place]) 06/13/2018  . Diabetes mellitus without complication ([place]) [id number]  . Morbid obesity ([place]) 10/26/2016    12:45 PM, 02/18/20 Jerene Pitch, DPT Physical Therapy with [facility]  [phone] office  [place] [address] [place], [place], [address] Phone: [phone]   Fax:  [phone]  Name: Michaela Aguilar MRN: [id number] Date of Birth: 10-14-1966

## 2020-02-23 ENCOUNTER — Ambulatory Visit (HOSPITAL_COMMUNITY): Payer: Medicaid Other | Admitting: Physical Therapy

## 2020-02-23 ENCOUNTER — Other Ambulatory Visit: Payer: Self-pay

## 2020-02-23 DIAGNOSIS — M542 Cervicalgia: Secondary | ICD-10-CM | POA: Diagnosis not present

## 2020-02-23 DIAGNOSIS — R293 Abnormal posture: Secondary | ICD-10-CM

## 2020-02-23 NOTE — Therapy (Signed)
[place], [place], [address] Phone: [phone]   Fax:  [phone]  Physical Therapy Treatment  Patient Details  Name: Michaela Aguilar MRN: [id number] Date of Birth: [birth year] Referring Provider (PT): Cherly Beach NP   Encounter Date: 02/23/2020  PT End of Session - 02/23/20 0907    Visit Number  3    Number of Visits  8    Date for PT Re-Evaluation  03/12/20    Authorization Type  Medicaid    Authorization Time Period  02/11/20-03/12/20 3 visits approved 4-5 to 4-18    Authorization - Visit Number  2    Authorization - Number of Visits  3    Progress Note Due on Visit  4    PT Start Time  0820    PT Stop Time  0858    PT Time Calculation (min)  38 min    Activity Tolerance  Patient tolerated treatment well    Behavior During Therapy  [facility] for tasks assessed/performed       Past Medical History:  Diagnosis Date  . Anxiety   . Bronchitis   . Chronic pain of left knee 04/03/2017  . Chronic pain of right knee 08/16/2017  . Depression   . Encounter for screening for malignant neoplasm of cervix 11/08/2019  . Encounter for screening for malignant neoplasm of colon 11/08/2019  . Encounter for screening mammogram for malignant neoplasm of breast 11/08/2019  . Gestational diabetes    1992  . History of kidney stones   . Kidney stones per pt.  . Low back pain 11/17/2015  . Neck pain 11/17/2015    Past Surgical History:  Procedure Laterality Date  . CESAREAN SECTION     x2  . CYSTOSCOPY WITH RETROGRADE PYELOGRAM, URETEROSCOPY AND STENT PLACEMENT Right 08/18/2019   Procedure: CYSTOSCOPY WITH RETROGRADE PYELOGRAM, URETEROSCOPY AND STENT PLACEMENT;  Surgeon: Cleon Gustin, MD;  Location: [facility];  Service: Urology;  Laterality: Right;  . CYSTOSCOPY WITH STENT PLACEMENT Right 09/06/2016   Procedure: CYSTOSCOPY WITH RIGHT URETERAL STENT PLACEMENT;  Surgeon: Cleon Gustin, MD;  Location: [facility];  Service:  Urology;  Laterality: Right;  . CYSTOSCOPY/RETROGRADE/URETEROSCOPY/STONE EXTRACTION WITH BASKET Right 09/06/2016   Procedure: CYSTOSCOPY/RIGHT RETROGRADE PYELOGRAM, RIGHT RENAL STONE EXTRACTION WITH LASER;  Surgeon: Cleon Gustin, MD;  Location: [facility];  Service: Urology;  Laterality: Right;  . [address] 2010  . HERNIA REPAIR     incisional hernia  . STONE EXTRACTION WITH BASKET Right 08/18/2019   Procedure: STONE EXTRACTION WITH BASKET;  Surgeon: Cleon Gustin, MD;  Location: [facility];  Service: Urology;  Laterality: Right;    There were no vitals filed for this visit.  Subjective Assessment - 02/23/20 0824    Subjective  pt states the dry needling really helped and has had less pain every since.  Currently without pain.    Currently in Pain?  No/denies                       [facility] Adult PT Treatment/Exercise - 02/23/20 0001      Neck Exercises: Machines for Strengthening   UBE (Upper Arm Bike)  3 minutes backward level 1      Neck Exercises: Seated   Shoulder Rolls  15 reps    Other Seated Exercise  cervical excursions 5 reps each    Other Seated Exercise  scaular retration 2x15 5"  holds       Manual Therapy   Manual Therapy  Soft tissue mobilization    Manual therapy comments  all manual interventions performed independently of other interventions    Soft tissue mobilization  STM to R UT/levator/SCM      Neck Exercises: Stretches   Upper Trapezius Stretch  2 reps;30 seconds;Right    Levator Stretch  2 reps;30 seconds;Right    Corner Stretch  3 reps;30 seconds               PT Short Term Goals - 02/11/20 1003      PT SHORT TERM GOAL #1   Title  Patient will be independent with initial HEP to improve functional outcomes    Time  2    Period  Weeks    Status  New    Target Date  02/27/20        PT Long Term Goals - 02/11/20 1003      PT LONG TERM GOAL #1   Title  Patient will report at least 75%  overall improvement in subjective complaint to indicate improvement in ability to perform ADLs.    Time  4    Period  Weeks    Status  New    Target Date  03/12/20      PT LONG TERM GOAL #2   Title  Patient will improve cervical rotation AROM to at least 65 degrees pain free bilaterally to improve ability to scan environment for safety and while driving.    Time  4    Period  Weeks    Status  New    Target Date  03/12/20      PT LONG TERM GOAL #3   Title  Patient will be able to sleep through the night with no disturbance due to increased neck pain for improved quality of life.    Time  4    Period  Weeks    Status  New    Target Date  03/12/20            Plan - 02/23/20 0908    Clinical Impression Statement  progressed this session with cervical ROM, neck and chest stretches.  Pt able to complete all without pain and with good results.  Soft tissue massage completed to Rt shoulder/neck complex with minimal tightness and only one deep spasm in upper trap resolved with manual.  Pt reported feeling much improved at end of session and without questions or concerns.    Examination-Activity Limitations  Lift;Carry;Reach Overhead;Dressing;Sleep;Hygiene/Grooming    Examination-Participation Restrictions  Yard Work;Community Activity;Driving    Stability/Clinical Decision Making  Stable/Uncomplicated    Rehab Potential  Good    PT Frequency  2x / week    PT Duration  4 weeks    PT Treatment/Interventions  ADLs/Self Care Home Management;Aquatic Therapy;Biofeedback;Cryotherapy;Electrical Stimulation;Therapeutic exercise;Contrast Bath;Therapeutic activities;Parrafin;Fluidtherapy;Ultrasound;Traction;Moist Heat;Iontophoresis 4mg /ml Dexamethasone;DME Instruction;Neuromuscular re-education;Manual techniques;Manual lymph drainage;Compression bandaging;Orthotic Fit/Training;Patient/family education;Vasopneumatic Device;Taping;Splinting;Joint Manipulations;Energy conservation;Spinal  Manipulations;Dry needling;Passive range of motion    PT Next Visit Plan  posture strengthening, neck mobility, thoracic mobility    PT Home Exercise Plan  4/7 scapular retraction, posture holds    Consulted and Agree with Plan of Care  Patient       Patient will benefit from skilled therapeutic intervention in order to improve the following deficits and impairments:  Improper body mechanics, Pain, Decreased mobility, Postural dysfunction, Decreased activity tolerance, Decreased range of motion, Decreased strength, Hypomobility, Impaired flexibility  Visit Diagnosis: Abnormal posture  Cervicalgia     Problem List Patient Active Problem List   Diagnosis Date Noted  . Nausea 02/04/2020  . Upper back pain on right side 02/04/2020  . Vitamin D deficiency 11/08/2019  . Pre-hypertension 11/08/2019  . Hyperlipidemia associated with type 2 diabetes mellitus ([place]) 11/08/2019  . Nicotine abuse 11/08/2019  . Wheezing 06/13/2018  . COPD with acute exacerbation ([place]) 06/13/2018  . Diabetes mellitus without complication ([place]) 123XX123  . Morbid obesity ([place]) 10/26/2016   Teena Irani, PTA/CLT [phone]  Teena Irani 02/23/2020, 9:12 AM  [place], [place], [address] Phone: [phone]   Fax:  [phone]  Name: LENAYA WHITEFIELD MRN: [id number] Date of Birth: May 21, 1966

## 2020-02-25 ENCOUNTER — Encounter (HOSPITAL_COMMUNITY): Payer: Self-pay | Admitting: Physical Therapy

## 2020-02-25 ENCOUNTER — Other Ambulatory Visit: Payer: Self-pay

## 2020-02-25 ENCOUNTER — Ambulatory Visit (HOSPITAL_COMMUNITY): Payer: Medicaid Other | Admitting: Physical Therapy

## 2020-02-25 DIAGNOSIS — M542 Cervicalgia: Secondary | ICD-10-CM | POA: Diagnosis not present

## 2020-02-25 DIAGNOSIS — R293 Abnormal posture: Secondary | ICD-10-CM | POA: Diagnosis not present

## 2020-02-25 NOTE — Therapy (Signed)
[place], [place], [address] Phone: [phone]   Fax:  [phone]  Physical Therapy Treatment  Patient Details  Name: Michaela Aguilar MRN: [id number] Date of Birth: Jan 05, 1966 Referring Provider (PT): Cherly Beach NP   Encounter Date: 02/25/2020  PT End of Session - 02/25/20 0916    Visit Number  4    Number of Visits  8    Date for PT Re-Evaluation  03/12/20    Authorization Type  Medicaid    Authorization Time Period  02/11/20-03/12/20 3 visits approved 4-5 to 4-18    Authorization - Visit Number  3    Authorization - Number of Visits  3    Progress Note Due on Visit  4    PT Start Time  0915    PT Stop Time  0955    PT Time Calculation (min)  40 min    Activity Tolerance  Patient tolerated treatment well    Behavior During Therapy  [facility] for tasks assessed/performed       Past Medical History:  Diagnosis Date  . Anxiety   . Bronchitis   . Chronic pain of left knee 04/03/2017  . Chronic pain of right knee 08/16/2017  . Depression   . Encounter for screening for malignant neoplasm of cervix 11/08/2019  . Encounter for screening for malignant neoplasm of colon 11/08/2019  . Encounter for screening mammogram for malignant neoplasm of breast 11/08/2019  . Gestational diabetes    1992  . History of kidney stones   . Kidney stones per pt.  . Low back pain 11/17/2015  . Neck pain 11/17/2015    Past Surgical History:  Procedure Laterality Date  . CESAREAN SECTION     x2  . CYSTOSCOPY WITH RETROGRADE PYELOGRAM, URETEROSCOPY AND STENT PLACEMENT Right 08/18/2019   Procedure: CYSTOSCOPY WITH RETROGRADE PYELOGRAM, URETEROSCOPY AND STENT PLACEMENT;  Surgeon: Cleon Gustin, MD;  [facility]: [facility];  Service: Urology;  Laterality: Right;  . CYSTOSCOPY WITH STENT PLACEMENT Right 09/06/2016   Procedure: CYSTOSCOPY WITH RIGHT URETERAL STENT PLACEMENT;  Surgeon: Cleon Gustin, MD;  [facility]: [facility];  Service:  Urology;  Laterality: Right;  . CYSTOSCOPY/RETROGRADE/URETEROSCOPY/STONE EXTRACTION WITH BASKET Right 09/06/2016   Procedure: CYSTOSCOPY/RIGHT RETROGRADE PYELOGRAM, RIGHT RENAL STONE EXTRACTION WITH LASER;  Surgeon: Cleon Gustin, MD;  Location: [facility];  Service: Urology;  Laterality: Right;  . [employer] 2010  . HERNIA REPAIR     incisional hernia  . STONE EXTRACTION WITH BASKET Right 08/18/2019   Procedure: STONE EXTRACTION WITH BASKET;  Surgeon: Cleon Gustin, MD;  Location: [facility];  Service: Urology;  Laterality: Right;    There were no vitals filed for this visit.  Subjective Assessment - 02/25/20 0922    Subjective  States she has been feeling better and massage and needling have helped. States she doesn't have a lot pain right now just soreness. And no severe soreness after last session.         [facility] PT Assessment - 02/25/20 0001      Assessment   Medical Diagnosis  upper right side back pain     Referring Provider (PT)  Cherly Beach NP    Next MD Visit  05/26/20                   [facility] Adult PT Treatment/Exercise - 02/25/20 0001      Neck Exercises: Standing   Other  Standing Exercises  shoulder extension at wall 3x10, 5" holds     Other Standing Exercises  shoulder W at wall 3x15 5" holds      Neck Exercises: Supine   Other Supine Exercise  shoulder punches 3x10 5" holds B     Other Supine Exercise  towel roll down spine - 5 minutes       Manual Therapy   Manual Therapy  Soft tissue mobilization    Manual therapy comments  all manual interventions performed independently of other interventions    Joint Mobilization  medial glides C5- C7 with and without ROT  grade II/III               PT Short Term Goals - 02/11/20 1003      PT SHORT TERM GOAL #1   Title  Patient will be independent with initial HEP to improve functional outcomes    Time  2    Period  Weeks    Status  New    Target Date  02/27/20         PT Long Term Goals - 02/11/20 1003      PT LONG TERM GOAL #1   Title  Patient will report at least 75% overall improvement in subjective complaint to indicate improvement in ability to perform ADLs.    Time  4    Period  Weeks    Status  New    Target Date  03/12/20      PT LONG TERM GOAL #2   Title  Patient will improve cervical rotation AROM to at least 65 degrees pain free bilaterally to improve ability to scan environment for safety and while driving.    Time  4    Period  Weeks    Status  New    Target Date  03/12/20      PT LONG TERM GOAL #3   Title  Patient will be able to sleep through the night with no disturbance due to increased neck pain for improved quality of life.    Time  4    Period  Weeks    Status  New    Target Date  03/12/20            Plan - 02/25/20 0959    Clinical Impression Statement  Patient had improvement in symptoms that needling was not warranted on this date. Focused on postural strength and scapular movements. Tolerated this very well with no increase in symptoms or soreness. Will continue to focus on posture exercises as well as scapular strength as tolerated.    Examination-Activity Limitations  Lift;Carry;Reach Overhead;Dressing;Sleep;Hygiene/Grooming    Examination-Participation Restrictions  Yard Work;Community Activity;Driving    Stability/Clinical Decision Making  Stable/Uncomplicated    Rehab Potential  Good    PT Frequency  2x / week    PT Duration  4 weeks    PT Treatment/Interventions  ADLs/Self Care Home Management;Aquatic Therapy;Biofeedback;Cryotherapy;Electrical Stimulation;Therapeutic exercise;Contrast Bath;Therapeutic activities;Parrafin;Fluidtherapy;Ultrasound;Traction;Moist Heat;Iontophoresis 4mg /ml Dexamethasone;DME Instruction;Neuromuscular re-education;Manual techniques;Manual lymph drainage;Compression bandaging;Orthotic Fit/Training;Patient/family education;Vasopneumatic Device;Taping;Splinting;Joint  Manipulations;Energy conservation;Spinal Manipulations;Dry needling;Passive range of motion    PT Next Visit Plan  scapular strengthening and posture strengthening, neck mobility, thoracic mobility    PT Home Exercise Plan  4/7 scapular retraction, posture holds    Consulted and Agree with Plan of Care  Patient       Patient will benefit from skilled therapeutic intervention in order to improve the following deficits and impairments:  Improper body mechanics, Pain, Decreased mobility, Postural  dysfunction, Decreased activity tolerance, Decreased range of motion, Decreased strength, Hypomobility, Impaired flexibility  Visit Diagnosis: Abnormal posture  Cervicalgia     Problem List Patient Active Problem List   Diagnosis Date Noted  . Nausea 02/04/2020  . Upper back pain on right side 02/04/2020  . Vitamin D deficiency 11/08/2019  . Pre-hypertension 11/08/2019  . Hyperlipidemia associated with type 2 diabetes mellitus ([place]) 11/08/2019  . Nicotine abuse 11/08/2019  . Wheezing 06/13/2018  . COPD with acute exacerbation ([place]) 06/13/2018  . Diabetes mellitus without complication ([place]) 123XX123  . Morbid obesity ([place]) 10/26/2016   10:02 AM, 02/25/20 Jerene Pitch, DPT Physical Therapy with [facility]  [phone] office  [place] [address] [place], [place], [address] Phone: [phone]   Fax:  [phone]  Name: Michaela Aguilar MRN: [id number] Date of Birth: 06/29/1966

## 2020-03-01 ENCOUNTER — Encounter (HOSPITAL_COMMUNITY): Payer: Self-pay | Admitting: Physical Therapy

## 2020-03-01 ENCOUNTER — Ambulatory Visit (HOSPITAL_COMMUNITY): Payer: Medicaid Other | Admitting: Physical Therapy

## 2020-03-01 ENCOUNTER — Other Ambulatory Visit: Payer: Self-pay

## 2020-03-01 DIAGNOSIS — R293 Abnormal posture: Secondary | ICD-10-CM

## 2020-03-01 DIAGNOSIS — M542 Cervicalgia: Secondary | ICD-10-CM

## 2020-03-01 NOTE — Therapy (Signed)
[place], [place], [address] Phone: [phone]   Fax:  [phone]  Physical Therapy Treatment/ Progress Note  Patient Details  Name: Michaela Aguilar MRN: [id number] Date of Birth: [birth year] Referring Provider (PT): Cherly Beach NP   Encounter Date: 03/01/2020   Progress Note Reporting Period 02/11/20 to 03/01/20  See note below for Objective Data and Assessment of Progress/Goals.       PT End of Session - 03/01/20 3545    Visit Number  5    Number of Visits  9    Date for PT Re-Evaluation  03/19/20    Authorization Type  Medicaid    Authorization Time Period  02/11/20-03/12/20 3 visits approved 4-5 to 4-18 ( applied for 4 more visits 03/01/20)    Authorization - Visit Number  3    Authorization - Number of Visits  3    Progress Note Due on Visit  9    PT Start Time  0817    Activity Tolerance  Patient tolerated treatment well    Behavior During Therapy  [facility] for tasks assessed/performed       Past Medical History:  Diagnosis Date  . Anxiety   . Bronchitis   . Chronic pain of left knee 04/03/2017  . Chronic pain of right knee 08/16/2017  . Depression   . Encounter for screening for malignant neoplasm of cervix 11/08/2019  . Encounter for screening for malignant neoplasm of colon 11/08/2019  . Encounter for screening mammogram for malignant neoplasm of breast 11/08/2019  . Gestational diabetes    1992  . History of kidney stones   . Kidney stones per pt.  . Low back pain 11/17/2015  . Neck pain 11/17/2015    Past Surgical History:  Procedure Laterality Date  . CESAREAN SECTION     x2  . CYSTOSCOPY WITH RETROGRADE PYELOGRAM, URETEROSCOPY AND STENT PLACEMENT Right 08/18/2019   Procedure: CYSTOSCOPY WITH RETROGRADE PYELOGRAM, URETEROSCOPY AND STENT PLACEMENT;  Surgeon: Cleon Gustin, MD;  Location: [facility];  Service: Urology;  Laterality: Right;  . CYSTOSCOPY WITH STENT PLACEMENT Right 09/06/2016   Procedure: CYSTOSCOPY WITH RIGHT URETERAL STENT PLACEMENT;  Surgeon: Cleon Gustin, MD;  Location: [facility];  Service: Urology;  Laterality: Right;  . CYSTOSCOPY/RETROGRADE/URETEROSCOPY/STONE EXTRACTION WITH BASKET Right 09/06/2016   Procedure: CYSTOSCOPY/RIGHT RETROGRADE PYELOGRAM, RIGHT RENAL STONE EXTRACTION WITH LASER;  Surgeon: Cleon Gustin, MD;  Location: [facility];  Service: Urology;  Laterality: Right;  . [place] 2010  . HERNIA REPAIR     incisional hernia  . STONE EXTRACTION WITH BASKET Right 08/18/2019   Procedure: STONE EXTRACTION WITH BASKET;  Surgeon: Cleon Gustin, MD;  Location: [facility];  Service: Urology;  Laterality: Right;    There were no vitals filed for this visit.  Subjective Assessment - 03/01/20 0821    Subjective  Patient says she is doing much better since starting therapy. Patient says neck still feels like it wants to act up, but it is a lot better than it was. Patient reports 75% improvement since starting therapy and notes that Dry needling has helped.    Currently in Pain?  No/denies         [facility] PT Assessment - 03/01/20 0001      Assessment   Medical Diagnosis  upper right side back pain     Referring Provider (PT)  Cherly Beach NP    Next MD Visit  05/26/20    Prior Therapy  yes      Precautions   Precautions  None      Restrictions   Weight Bearing Restrictions  No      Home Environment   Living Environment  Private residence    Living Arrangements  Non-relatives/Friends      Prior Function   Level of Independence  Independent      Cognition   Overall Cognitive Status  Within Functional Limits for tasks assessed      Posture/Postural Control   Posture/Postural Control  Postural limitations    Postural Limitations  Rounded Shoulders;Forward head      AROM   Cervical Flexion  WFL    Cervical Extension  40   was 35   Cervical - Right Side Bend  35   was 14   Cervical - Left Side Bend  33    was 22   Cervical - Right Rotation  67   was 64   Cervical - Left Rotation  66   was 61     Flexibility   Soft Tissue Assessment /Muscle Length  yes      Palpation   Palpation comment  Min/ mod tenderness to palpation with noted trigger point about RT upper trap/ levator insertion point                    [facility] Adult PT Treatment/Exercise - 03/01/20 0001      Neck Exercises: Seated   Cervical Isometrics  Right lateral flexion;Left lateral flexion;5 secs;5 reps    Other Seated Exercise  cervical 3D excursions 5 reps each             PT Education - 03/01/20 0821    Education Details  on reassessment findings and POC    Person(s) Educated  Patient    Methods  Explanation    Comprehension  Verbalized understanding       PT Short Term Goals - 03/01/20 0839      PT SHORT TERM GOAL #1   Title  Patient will be independent with initial HEP to improve functional outcomes    Baseline  Reports compliance and returns demonstration    Time  2    Period  Weeks    Status  Achieved    Target Date  02/27/20        PT Long Term Goals - 03/01/20 0839      PT LONG TERM GOAL #1   Title  Patient will report at least 75% overall improvement in subjective complaint to indicate improvement in ability to perform ADLs.    Baseline  Current: 75%    Time  4    Period  Weeks    Status  On-going      PT LONG TERM GOAL #2   Title  Patient will improve cervical rotation AROM to at least 65 degrees pain free bilaterally to improve ability to scan environment for safety and while driving.    Baseline  Current: Rt 67; LT 66    Time  4    Period  Weeks    Status  Achieved      PT LONG TERM GOAL #3   Title  Patient will be able to sleep through the night with no disturbance due to increased neck pain for improved quality of life.    Baseline  Patient reports this is much better but still has tension, tightness in Rt upper trap area that  causes discomfort.    Time  4     Period  Weeks    Status  On-going            Plan - 03/01/20 0847    Clinical Impression Statement  Patient is making good progress toward therapy goals. Patient currently with 1/1 short term goals, and 1/3 long term goals currently met. Patient shows good improvements pain free with cervical AROM, but continues to be limited my muscle restriction, flexibility restriction and postural deficits which continue to negatively impact daily function and quality of life.  Patient will continue to benefit from skilled therapy services to address remaining deficits to reduce pain and improve level of function with ADLs and quality of life.    Examination-Activity Limitations  Lift;Carry;Reach Overhead;Dressing;Sleep;Hygiene/Grooming    Examination-Participation Restrictions  Yard Work;Community Activity;Driving    Stability/Clinical Decision Making  Stable/Uncomplicated    Rehab Potential  Good    PT Frequency  2x / week    PT Duration  2 weeks    PT Treatment/Interventions  ADLs/Self Care Home Management;Aquatic Therapy;Biofeedback;Cryotherapy;Electrical Stimulation;Therapeutic exercise;Contrast Bath;Therapeutic activities;Parrafin;Fluidtherapy;Ultrasound;Traction;Moist Heat;Iontophoresis '4mg'$ /ml Dexamethasone;DME Instruction;Neuromuscular re-education;Manual techniques;Manual lymph drainage;Compression bandaging;Orthotic Fit/Training;Patient/family education;Vasopneumatic Device;Taping;Splinting;Joint Manipulations;Energy conservation;Spinal Manipulations;Dry needling;Passive range of motion    PT Next Visit Plan  scapular strengthening and posture strengthening, neck mobility, thoracic mobility. Continue DN and manual as indicated for cervical muscle restriction    PT Home Exercise Plan  4/7 scapular retraction, posture holds    Consulted and Agree with Plan of Care  Patient       Patient will benefit from skilled therapeutic intervention in order to improve the following deficits and impairments:   Improper body mechanics, Pain, Decreased mobility, Postural dysfunction, Decreased activity tolerance, Decreased range of motion, Decreased strength, Hypomobility, Impaired flexibility  Visit Diagnosis: Abnormal posture  Cervicalgia     Problem List Patient Active Problem List   Diagnosis Date Noted  . Nausea 02/04/2020  . Upper back pain on right side 02/04/2020  . Vitamin D deficiency 11/08/2019  . Pre-hypertension 11/08/2019  . Hyperlipidemia associated with type 2 diabetes mellitus ([place]) 11/08/2019  . Nicotine abuse 11/08/2019  . Wheezing 06/13/2018  . COPD with acute exacerbation ([place]) 06/13/2018  . Diabetes mellitus without complication ([place]) 11/57/2620  . Morbid obesity ([place]) 10/26/2016    8:55 AM, 03/01/20 Josue Hector PT DPT  Physical Therapist with [facility]  [phone] [place] [address] [place], [place], [address] Phone: [phone]   Fax:  [phone]  Name: Michaela Aguilar MRN: [id number] Date of Birth: 1966/10/13

## 2020-03-02 ENCOUNTER — Ambulatory Visit (HOSPITAL_COMMUNITY): Payer: Medicaid Other

## 2020-03-03 ENCOUNTER — Telehealth (HOSPITAL_COMMUNITY): Payer: Self-pay | Admitting: Physical Therapy

## 2020-03-03 ENCOUNTER — Ambulatory Visit: Payer: Medicaid Other | Admitting: Urology

## 2020-03-03 ENCOUNTER — Ambulatory Visit (HOSPITAL_COMMUNITY): Payer: Medicaid Other | Admitting: Physical Therapy

## 2020-03-03 NOTE — Telephone Encounter (Signed)
per provider to cancel today's visit due to no mcd approval.

## 2020-03-08 ENCOUNTER — Encounter (HOSPITAL_COMMUNITY): Payer: Self-pay

## 2020-03-08 ENCOUNTER — Telehealth (HOSPITAL_COMMUNITY): Payer: Self-pay | Admitting: Physical Therapy

## 2020-03-08 ENCOUNTER — Ambulatory Visit (HOSPITAL_COMMUNITY): Payer: Medicaid Other | Admitting: Physical Therapy

## 2020-03-08 NOTE — Telephone Encounter (Signed)
pt called to cx this morning due to a family emergency

## 2020-03-10 ENCOUNTER — Ambulatory Visit (HOSPITAL_COMMUNITY): Payer: Medicaid Other | Admitting: Physical Therapy

## 2020-03-10 ENCOUNTER — Encounter (HOSPITAL_COMMUNITY): Payer: Self-pay | Admitting: Physical Therapy

## 2020-03-10 ENCOUNTER — Other Ambulatory Visit: Payer: Self-pay

## 2020-03-10 DIAGNOSIS — R293 Abnormal posture: Secondary | ICD-10-CM

## 2020-03-10 DIAGNOSIS — M542 Cervicalgia: Secondary | ICD-10-CM

## 2020-03-10 NOTE — Therapy (Signed)
[place], [place], [address] Phone: [phone]   Fax:  [phone]  Physical Therapy Treatment  Patient Details  Name: Michaela Aguilar MRN: [id number] Date of Birth: Apr 12, 1966 Referring Provider (PT): Cherly Beach NP   Encounter Date: 03/10/2020  PT End of Session - 03/10/20 0914    Visit Number  6    Number of Visits  9    Date for PT Re-Evaluation  03/19/20    Authorization Type  Medicaid    Authorization Time Period  03/08/20 - 03/21/20 4 visits approved    Authorization - Visit Number  1    Authorization - Number of Visits  4    Progress Note Due on Visit  9    PT Start Time  0915    PT Stop Time  0955    PT Time Calculation (min)  40 min    Activity Tolerance  Patient tolerated treatment well    Behavior During Therapy  [facility] for tasks assessed/performed       Past Medical History:  Diagnosis Date  . Anxiety   . Bronchitis   . Chronic pain of left knee 04/03/2017  . Chronic pain of right knee 08/16/2017  . Depression   . Encounter for screening for malignant neoplasm of cervix 11/08/2019  . Encounter for screening for malignant neoplasm of colon 11/08/2019  . Encounter for screening mammogram for malignant neoplasm of breast 11/08/2019  . Gestational diabetes    1992  . History of kidney stones   . Kidney stones per pt.  . Low back pain 11/17/2015  . Neck pain 11/17/2015    Past Surgical History:  Procedure Laterality Date  . CESAREAN SECTION     x2  . CYSTOSCOPY WITH RETROGRADE PYELOGRAM, URETEROSCOPY AND STENT PLACEMENT Right 08/18/2019   Procedure: CYSTOSCOPY WITH RETROGRADE PYELOGRAM, URETEROSCOPY AND STENT PLACEMENT;  Surgeon: Cleon Gustin, MD;  Location: [facility];  Service: Urology;  Laterality: Right;  . CYSTOSCOPY WITH STENT PLACEMENT Right 09/06/2016   Procedure: CYSTOSCOPY WITH RIGHT URETERAL STENT PLACEMENT;  Surgeon: Cleon Gustin, MD;  Location: [facility];  Service: Urology;   Laterality: Right;  . CYSTOSCOPY/RETROGRADE/URETEROSCOPY/STONE EXTRACTION WITH BASKET Right 09/06/2016   Procedure: CYSTOSCOPY/RIGHT RETROGRADE PYELOGRAM, RIGHT RENAL STONE EXTRACTION WITH LASER;  Surgeon: Cleon Gustin, MD;  Location: [facility];  Service: Urology;  Laterality: Right;  . [place] 2010  . HERNIA REPAIR     incisional hernia  . STONE EXTRACTION WITH BASKET Right 08/18/2019   Procedure: STONE EXTRACTION WITH BASKET;  Surgeon: Cleon Gustin, MD;  Location: [facility];  Service: Urology;  Laterality: Right;    There were no vitals filed for this visit.  Subjective Assessment - 03/10/20 0916    Subjective  States she feels like something is pulling on her neck on the right side. This started on Friday and then on saturday when it rained it was really bad and then she did her exercises and they do seem to help.    Currently in Pain?  Yes    Pain Score  3     Pain Location  Neck    Pain Orientation  Right         [facility] PT Assessment - 03/10/20 0001      Assessment   Medical Diagnosis  upper right side back pain     Referring Provider (PT)  Cherly Beach NP    Next  MD Visit  05/26/20    Prior Therapy  yes                   [facility] Adult PT Treatment/Exercise - 03/10/20 0001      Neck Exercises: Standing   Other Standing Exercises  doorway isometrics - horizontal shoulder abduction B with breathing and AROM 3x5 5" holds Each       Neck Exercises: Supine   Other Supine Exercise  1/2 foam in crook of neck 5 minutes with more cervical extension. 1/2 foam down spine 5 minutes total       Manual Therapy   Manual Therapy  Soft tissue mobilization    Manual therapy comments  all manual interventions performed independently of other interventions    Soft tissue mobilization  strain counter strain to right UT - held for 2 min in 3 different spots with R SB of neck - symptoms abolished.               PT Short Term Goals  - 03/01/20 0839      PT SHORT TERM GOAL #1   Title  Patient will be independent with initial HEP to improve functional outcomes    Baseline  Reports compliance and returns demonstration    Time  2    Period  Weeks    Status  Achieved    Target Date  02/27/20        PT Long Term Goals - 03/01/20 0839      PT LONG TERM GOAL #1   Title  Patient will report at least 75% overall improvement in subjective complaint to indicate improvement in ability to perform ADLs.    Baseline  Current: 75%    Time  4    Period  Weeks    Status  On-going      PT LONG TERM GOAL #2   Title  Patient will improve cervical rotation AROM to at least 65 degrees pain free bilaterally to improve ability to scan environment for safety and while driving.    Baseline  Current: Rt 67; LT 66    Time  4    Period  Weeks    Status  Achieved      PT LONG TERM GOAL #3   Title  Patient will be able to sleep through the night with no disturbance due to increased neck pain for improved quality of life.    Baseline  Patient reports this is much better but still has tension, tightness in Rt upper trap area that causes discomfort.    Time  4    Period  Weeks    Status  On-going            Plan - 03/10/20 0915    Clinical Impression Statement  Focused on exercises to help with current symptoms. Pain resolved with doorway stretch and most of tightness resolved with  foam roller stretch. Able to abolish rest of symptoms with strain/counter strain technique to upper trap. Will continue to work on movements/exercises to help improve self management of symptoms.    Examination-Activity Limitations  Lift;Carry;Reach Overhead;Dressing;Sleep;Hygiene/Grooming    Examination-Participation Restrictions  Yard Work;Community Activity;Driving    Stability/Clinical Decision Making  Stable/Uncomplicated    Rehab Potential  Good    PT Frequency  2x / week    PT Duration  2 weeks    PT Treatment/Interventions  ADLs/Self Care  Home Management;Aquatic Therapy;Biofeedback;Cryotherapy;Electrical Stimulation;Therapeutic exercise;Contrast Bath;Therapeutic activities;Parrafin;Fluidtherapy;Ultrasound;Traction;Moist Heat;Iontophoresis 4mg /ml Dexamethasone;DME Instruction;Neuromuscular re-education;Manual  techniques;Manual lymph drainage;Compression bandaging;Orthotic Fit/Training;Patient/family education;Vasopneumatic Device;Taping;Splinting;Joint Manipulations;Energy conservation;Spinal Manipulations;Dry needling;Passive range of motion    PT Next Visit Plan  scapular strengthening and posture strengthening, neck mobility, thoracic mobility. Continue DN and manual as indicated for cervical muscle restriction    PT Home Exercise Plan  4/7 scapular retraction, posture holds; 4/28 doorway isometric and 1/2 foam roll    Consulted and Agree with Plan of Care  Patient       Patient will benefit from skilled therapeutic intervention in order to improve the following deficits and impairments:  Improper body mechanics, Pain, Decreased mobility, Postural dysfunction, Decreased activity tolerance, Decreased range of motion, Decreased strength, Hypomobility, Impaired flexibility  Visit Diagnosis: Abnormal posture  Cervicalgia     Problem List Patient Active Problem List   Diagnosis Date Noted  . Nausea 02/04/2020  . Upper back pain on right side 02/04/2020  . Vitamin D deficiency 11/08/2019  . Pre-hypertension 11/08/2019  . Hyperlipidemia associated with type 2 diabetes mellitus ([place]) 11/08/2019  . Nicotine abuse 11/08/2019  . Wheezing 06/13/2018  . COPD with acute exacerbation ([place]) 06/13/2018  . Diabetes mellitus without complication ([place]) [id number]  . Morbid obesity ([place]) 10/26/2016   9:59 AM, 03/10/20 Michaela Pitch, DPT Physical Therapy with [facility]  [phone] office  [place] [address] [place], [place], [address] Phone: [phone]    Fax:  [phone]  Name: ZAREYA PARRIS MRN: [id number] Date of Birth: 01-08-1966

## 2020-03-10 NOTE — Patient Instructions (Signed)
.   doorway/hallway press outs- stand in doorway and slide hands up doorway frame - press hands into doorway at top - then while continuing to press into doorway slide hands down doorway frame - at the bottom try to press out into the doorway frame as if you are trying to make a larger doorway - hold / press for 5 seconds - make sure you breath - 3 sets of 5 everyday - if you're in pain try it.  . with  foam roller o put in crook of neck and then let head extend backwards for gentle stretch o lay down with it down your spine and relax on it.

## 2020-03-13 DIAGNOSIS — Z23 Encounter for immunization: Secondary | ICD-10-CM | POA: Diagnosis not present

## 2020-03-15 ENCOUNTER — Ambulatory Visit (HOSPITAL_COMMUNITY): Payer: Medicaid Other | Attending: Family Medicine | Admitting: Physical Therapy

## 2020-03-15 DIAGNOSIS — M542 Cervicalgia: Secondary | ICD-10-CM | POA: Insufficient documentation

## 2020-03-15 DIAGNOSIS — R293 Abnormal posture: Secondary | ICD-10-CM | POA: Insufficient documentation

## 2020-03-17 ENCOUNTER — Ambulatory Visit (HOSPITAL_COMMUNITY): Payer: Medicaid Other | Admitting: Physical Therapy

## 2020-03-17 ENCOUNTER — Encounter (HOSPITAL_COMMUNITY): Payer: Self-pay | Admitting: Physical Therapy

## 2020-03-17 ENCOUNTER — Other Ambulatory Visit: Payer: Self-pay

## 2020-03-17 DIAGNOSIS — M542 Cervicalgia: Secondary | ICD-10-CM

## 2020-03-17 DIAGNOSIS — R293 Abnormal posture: Secondary | ICD-10-CM

## 2020-03-17 NOTE — Therapy (Signed)
[place] [address] [place], [place], [address] Phone: [phone]   Fax:  [phone]  Physical Therapy Treatment and Discharge Note  Patient Details  Name: Michaela Aguilar MRN: [id number] Date of Birth: 05-08-1966 Referring Provider (PT): Cherly Beach NP  PHYSICAL THERAPY DISCHARGE SUMMARY  Visits from Start of Care: 7  Current functional level related to goals / functional outcomes: See below   Remaining deficits: See below   Education / Equipment: See below Plan: Patient agrees to discharge.  Patient goals were partially met. Patient is being discharged due to being pleased with the current functional level.  ?????        Encounter Date: 03/17/2020  PT End of Session - 03/17/20 0828    Visit Number  7    Number of Visits  9    Date for PT Re-Evaluation  03/19/20    Authorization Type  Medicaid    Authorization Time Period  03/08/20 - 03/21/20 4 visits approved    Authorization - Visit Number  2    Authorization - Number of Visits  4    Progress Note Due on Visit  9    PT Start Time  0830    PT Stop Time  0900    PT Time Calculation (min)  30 min    Activity Tolerance  Patient tolerated treatment well    Behavior During Therapy  [facility] for tasks assessed/performed       Past Medical History:  Diagnosis Date  . Anxiety   . Bronchitis   . Chronic pain of left knee 04/03/2017  . Chronic pain of right knee 08/16/2017  . Depression   . Encounter for screening for malignant neoplasm of cervix 11/08/2019  . Encounter for screening for malignant neoplasm of colon 11/08/2019  . Encounter for screening mammogram for malignant neoplasm of breast 11/08/2019  . Gestational diabetes    1992  . History of kidney stones   . Kidney stones per pt.  . Low back pain 11/17/2015  . Neck pain 11/17/2015    Past Surgical History:  Procedure Laterality Date  . CESAREAN SECTION     x2  . CYSTOSCOPY WITH RETROGRADE PYELOGRAM,  URETEROSCOPY AND STENT PLACEMENT Right 08/18/2019   Procedure: CYSTOSCOPY WITH RETROGRADE PYELOGRAM, URETEROSCOPY AND STENT PLACEMENT;  Surgeon: Cleon Gustin, MD;  Location[facility];  Service: Urology;  Laterality: Right;  . CYSTOSCOPY WITH STENT PLACEMENT Right 09/06/2016   Procedure: CYSTOSCOPY WITH RIGHT URETERAL STENT PLACEMENT;  Surgeon: Cleon Gustin, MD;  Location: [facility];  Service: Urology;  Laterality: Right;  . CYSTOSCOPY/RETROGRADE/URETEROSCOPY/STONE EXTRACTION WITH BASKET Right 09/06/2016   Procedure: CYSTOSCOPY/RIGHT RETROGRADE PYELOGRAM, RIGHT RENAL STONE EXTRACTION WITH LASER;  Surgeon: Cleon Gustin, MD;  Location: [facility];  Service: Urology;  Laterality: Right;  . [place] 2010  . HERNIA REPAIR     incisional hernia  . STONE EXTRACTION WITH BASKET Right 08/18/2019   Procedure: STONE EXTRACTION WITH BASKET;  Surgeon: Cleon Gustin, MD;  Location: [facility];  Service: Urology;  Laterality: Right;    There were no vitals filed for this visit.      [facility] PT Assessment - 03/17/20 0001      Assessment   Medical Diagnosis  upper right side back pain     Referring Provider (PT)  Cherly Beach NP    Next MD Visit  05/26/20      AROM   Cervical Flexion  [facility]    Cervical Extension  45   pulling   Cervical - Right Side Bend  35    Cervical - Left Side Bend  35   pulling on right side   Cervical - Right Rotation  65   pulling   Cervical - Left Rotation  66      Strength   Right Shoulder Flexion  5/5    Right Shoulder ABduction  5/5    Right Shoulder Internal Rotation  5/5    Right Shoulder External Rotation  5/5    Left Shoulder Flexion  5/5    Left Shoulder ABduction  5/5    Left Shoulder Internal Rotation  5/5    Left Shoulder External Rotation  5/5                   [facility] Adult PT Treatment/Exercise - 03/17/20 0001      Manual Therapy   Manual Therapy  Soft tissue mobilization    Manual therapy  comments  all manual interventions performed independently of other interventions    Soft tissue mobilization  STM to R UT/levator/SCM       Trigger Point Dry Needling - 03/17/20 0001    Consent Given?  Yes    Education Handout Provided  Previously provided    Muscles Treated Head and Neck  Upper trapezius    Other Dry Needling  3 needles used with muscle pulled away from body, pt prone     Upper Trapezius Response  Twitch reponse elicited;Palpable increased muscle length   R           PT Education - 03/17/20 0912    Education Details  educated patient on current condition, progress and plan going forward. Reviewed HEP    Person(s) Educated  Patient    Methods  Explanation    Comprehension  Verbalized understanding       PT Short Term Goals - 03/01/20 0839      PT SHORT TERM GOAL #1   Title  Patient will be independent with initial HEP to improve functional outcomes    Baseline  Reports compliance and returns demonstration    Time  2    Period  Weeks    Status  Achieved    Target Date  02/27/20        PT Long Term Goals - 03/17/20 0839      PT LONG TERM GOAL #1   Title  Patient will report at least 75% overall improvement in subjective complaint to indicate improvement in ability to perform ADLs.    Baseline  Current: 85%    Time  4    Period  Weeks    Status  Achieved      PT LONG TERM GOAL #2   Title  Patient will improve cervical rotation AROM to at least 65 degrees pain free bilaterally to improve ability to scan environment for safety and while driving.    Baseline  Current: Rt 67; LT 66    Time  4    Period  Weeks    Status  Achieved      PT LONG TERM GOAL #3   Title  Patient will be able to sleep through the night with no disturbance due to increased neck pain for improved quality of life.    Baseline  Patient reports this is much better but still has tension, tightness in Rt upper trap area that causes discomfort.    Time  4    Period  Weeks     Status  On-going            Plan - 03/17/20 0830    Clinical Impression Statement  Patient present for progress note on this date. She has made great progress since start of PT and is independent in HEP. Patient has met 1/1 short term goals and 2/3 long term goals at this time. Answered all questions. Patient to discharge from PT to HEP secondary to progress made.    Examination-Activity Limitations  Lift;Carry;Reach Overhead;Dressing;Sleep;Hygiene/Grooming    Examination-Participation Restrictions  Yard Work;Community Activity;Driving    Stability/Clinical Decision Making  Stable/Uncomplicated    Rehab Potential  Good    PT Frequency  2x / week    PT Duration  2 weeks    PT Treatment/Interventions  ADLs/Self Care Home Management;Aquatic Therapy;Biofeedback;Cryotherapy;Electrical Stimulation;Therapeutic exercise;Contrast Bath;Therapeutic activities;Parrafin;Fluidtherapy;Ultrasound;Traction;Moist Heat;Iontophoresis 63m/ml Dexamethasone;DME Instruction;Neuromuscular re-education;Manual techniques;Manual lymph drainage;Compression bandaging;Orthotic Fit/Training;Patient/family education;Vasopneumatic Device;Taping;Splinting;Joint Manipulations;Energy conservation;Spinal Manipulations;Dry needling;Passive range of motion    PT Next Visit Plan  pt to DC from PT to HEP at this time    PT Home Exercise Plan  4/7 scapular retraction, posture holds; 4/28 doorway isometric and 1/2 foam roll    Consulted and Agree with Plan of Care  Patient       Patient will benefit from skilled therapeutic intervention in order to improve the following deficits and impairments:  Improper body mechanics, Pain, Decreased mobility, Postural dysfunction, Decreased activity tolerance, Decreased range of motion, Decreased strength, Hypomobility, Impaired flexibility  Visit Diagnosis: Abnormal posture  Cervicalgia     Problem List Patient Active Problem List   Diagnosis Date Noted  . Nausea 02/04/2020  . Upper  back pain on right side 02/04/2020  . Vitamin D deficiency 11/08/2019  . Pre-hypertension 11/08/2019  . Hyperlipidemia associated with type 2 diabetes mellitus ([place] 11/08/2019  . Nicotine abuse 11/08/2019  . Wheezing 06/13/2018  . COPD with acute exacerbation ([place] 06/13/2018  . Diabetes mellitus without complication ([place] 158/34/6219 . Morbid obesity ([place] 10/26/2016    9:15 AM, 03/17/20 MJerene Pitch DPT [facility] [phone]  [place] [address]Phone: [phone]  Fax:  [phone] Name: Michaela KOOPMRN: [id number] of Birth: 2May 16, 1967

## 2020-03-18 ENCOUNTER — Other Ambulatory Visit: Payer: Medicaid Other | Admitting: Adult Health

## 2020-03-30 ENCOUNTER — Ambulatory Visit (HOSPITAL_COMMUNITY): Payer: Medicaid Other | Admitting: Physical Therapy

## 2020-03-31 ENCOUNTER — Other Ambulatory Visit: Payer: Self-pay | Admitting: *Deleted

## 2020-03-31 ENCOUNTER — Telehealth: Payer: Self-pay

## 2020-03-31 DIAGNOSIS — R062 Wheezing: Secondary | ICD-10-CM

## 2020-03-31 DIAGNOSIS — J441 Chronic obstructive pulmonary disease with (acute) exacerbation: Secondary | ICD-10-CM

## 2020-03-31 MED ORDER — ALBUTEROL SULFATE HFA 108 (90 BASE) MCG/ACT IN AERS: 2.0000 | INHALATION_SPRAY | Freq: Four times a day (QID) | RESPIRATORY_TRACT | 1 refills | Status: DC | PRN

## 2020-03-31 NOTE — Telephone Encounter (Signed)
albuterol (VENTOLIN HFA) 108 (90 Base) MCG/ACT inhaler  Please call in

## 2020-03-31 NOTE — Telephone Encounter (Signed)
Medication sent to the pharmacy.

## 2020-04-10 DIAGNOSIS — Z23 Encounter for immunization: Secondary | ICD-10-CM | POA: Diagnosis not present

## 2020-05-26 ENCOUNTER — Other Ambulatory Visit: Payer: Self-pay | Admitting: *Deleted

## 2020-05-26 ENCOUNTER — Other Ambulatory Visit: Payer: Self-pay | Admitting: Family Medicine

## 2020-05-26 ENCOUNTER — Other Ambulatory Visit: Payer: Self-pay

## 2020-05-26 ENCOUNTER — Ambulatory Visit (INDEPENDENT_AMBULATORY_CARE_PROVIDER_SITE_OTHER): Payer: Medicaid Other | Admitting: Family Medicine

## 2020-05-26 ENCOUNTER — Encounter: Payer: Self-pay | Admitting: Family Medicine

## 2020-05-26 DIAGNOSIS — R062 Wheezing: Secondary | ICD-10-CM

## 2020-05-26 DIAGNOSIS — E785 Hyperlipidemia, unspecified: Secondary | ICD-10-CM

## 2020-05-26 DIAGNOSIS — R030 Elevated blood-pressure reading, without diagnosis of hypertension: Secondary | ICD-10-CM

## 2020-05-26 DIAGNOSIS — J449 Chronic obstructive pulmonary disease, unspecified: Secondary | ICD-10-CM | POA: Diagnosis not present

## 2020-05-26 DIAGNOSIS — E119 Type 2 diabetes mellitus without complications: Secondary | ICD-10-CM

## 2020-05-26 DIAGNOSIS — J441 Chronic obstructive pulmonary disease with (acute) exacerbation: Secondary | ICD-10-CM

## 2020-05-26 DIAGNOSIS — R110 Nausea: Secondary | ICD-10-CM

## 2020-05-26 DIAGNOSIS — Z720 Tobacco use: Secondary | ICD-10-CM | POA: Diagnosis not present

## 2020-05-26 LAB — POCT GLYCOSYLATED HEMOGLOBIN (HGB A1C)
HbA1c POC (<> result, manual entry): 6.2 % (ref 4.0–5.6)
HbA1c, POC (controlled diabetic range): 6.2 % (ref 0.0–7.0)
HbA1c, POC (prediabetic range): 6.2 % (ref 5.7–6.4)
Hemoglobin A1C: 6.2 % — AB (ref 4.0–5.6)

## 2020-05-26 MED ORDER — GLIPIZIDE 5 MG PO TABS: 5.0000 mg | ORAL_TABLET | Freq: Every day | ORAL | 5 refills | Status: DC

## 2020-05-26 MED ORDER — LISINOPRIL 10 MG PO TABS: 10.0000 mg | ORAL_TABLET | Freq: Every day | ORAL | 1 refills | Status: DC

## 2020-05-26 MED ORDER — FAMOTIDINE 20 MG PO TABS: 20.0000 mg | ORAL_TABLET | Freq: Two times a day (BID) | ORAL | 1 refills | Status: DC

## 2020-05-26 MED ORDER — ATORVASTATIN CALCIUM 40 MG PO TABS: 40.0000 mg | ORAL_TABLET | Freq: Every day | ORAL | 3 refills | Status: DC

## 2020-05-26 NOTE — Patient Instructions (Signed)
I appreciate the opportunity to provide you with care for your health and wellness. Today we discussed: Diabetes and Smoking  Follow up: 5 months for Annual -Fasting for labs that day  No labs or referrals today  Please continue to work on smoking cessation. Eat a well balanced diet and drink plenty of water.  A1c was good today! Continue all medications as ordered :)  Please continue to practice social distancing to keep you, your family, and our community safe.  If you must go out, please wear a mask and practice good handwashing.  It was a pleasure to see you and I look forward to continuing to work together on your health and well-being. Please do not hesitate to call the office if you need care or have questions about your care.  Have a wonderful day and week. With Gratitude, Cherly Beach, DNP, AGNP-BC

## 2020-05-26 NOTE — Progress Notes (Signed)
Subjective:  Patient ID: Michaela Aguilar, female    DOB: 1966/03/27  Age: 54 y.o. MRN: [id number]  CC:  Chief Complaint  Patient presents with  . Follow-up    A1c check      HPI  HPI  [name] is a 54 year old female patient who presents today for A1c check and follow-up on diabetes.  Last visit we had adjusted her glipizide by lowering it to 5 mg secondary to her A1c being 5.6 and having overcorrection.  Recheck today demonstrated 6.2% overall doing well.  She reports she is trying to eat better.  However she has gained weight.  She was doing keto but has stopped doing that.  He has had some headaches for last week or so but thinks is related to extra heat.  She denies having any increased thirst, appetite, urination.  She denies having any other issues or concerns today.  Reports she is still smoking a half a pack a day.  Using her inhaler occasionally.  Of note she still has not signed up for colonoscopy.  I have encouraged her to do this as well.  As referral sent last December.   Today patient denies signs and symptoms of COVID 19 infection including fever, chills, cough, shortness of breath, and headache. Past Medical, Surgical, Social History, Allergies, and Medications have been Reviewed.   Past Medical History:  Diagnosis Date  . Anxiety   . Bronchitis   . Chronic pain of left knee 04/03/2017  . Chronic pain of right knee 08/16/2017  . Depression   . Encounter for screening for malignant neoplasm of cervix 11/08/2019  . Encounter for screening for malignant neoplasm of colon 11/08/2019  . Encounter for screening mammogram for malignant neoplasm of breast 11/08/2019  . Gestational diabetes    1992  . History of kidney stones   . Kidney stones per pt.  . Low back pain 11/17/2015  . Neck pain 11/17/2015    Current Meds  Medication Sig  . albuterol (VENTOLIN HFA) 108 (90 Base) MCG/ACT inhaler Inhale 2 puffs into the lungs every 6 (six) hours as needed for wheezing or  shortness of breath.  Marland Kitchen atorvastatin (LIPITOR) 40 MG tablet Take 1 tablet (40 mg total) by mouth daily.  . famotidine (PEPCID) 20 MG tablet Take 1 tablet (20 mg total) by mouth 2 (two) times daily.  Marland Kitchen glipiZIDE (GLUCOTROL) 5 MG tablet Take 1 tablet (5 mg total) by mouth daily before breakfast.  . lisinopril (ZESTRIL) 10 MG tablet Take 1 tablet (10 mg total) by mouth daily.  . Multiple Vitamins-Minerals (MULTIVITAMIN WITH MINERALS) tablet Take 1 tablet by mouth daily.  . Omega-3 Fatty Acids (FISH OIL) 1000 MG CAPS Take 1 capsule by mouth in the morning and at bedtime.  . ondansetron (ZOFRAN ODT) 4 MG disintegrating tablet Take 1 tablet (4 mg total) by mouth every 8 (eight) hours as needed for nausea or vomiting.  . Vitamin D, Ergocalciferol, (DRISDOL) 1.25 MG (50000 UT) CAPS capsule Take 1 capsule (50,000 Units total) by mouth every 7 (seven) days.    ROS:  Review of Systems  Constitutional: Negative.   HENT: Negative.   Eyes: Negative.   Respiratory: Negative.   Cardiovascular: Negative.   Gastrointestinal: Negative.   Genitourinary: Negative.   Musculoskeletal: Negative.   Skin: Negative.   Neurological: Negative.   Endo/Heme/Allergies: Negative.   Psychiatric/Behavioral: Negative.   All other systems reviewed and are negative.    Objective:   Today's Vitals:  BP 135/77 (BP Location: Right Arm, Patient Position: Sitting, Cuff Size: Normal)   Pulse 80   Temp 97.6 F (36.4 C) (Temporal)   Resp 16   Ht 5\' 5"  (1.651 m)   Wt 223 lb (101.2 kg)   LMP 02/16/2014   SpO2 93%   BMI 37.11 kg/m  Vitals with BMI 05/26/2020 02/04/2020 11/04/2019  Height 5\' 5"  5\' 5"  5\' 5"   Weight 223 lbs 215 lbs 231 lbs 2 oz  BMI 37.11 71.24 58.09  Systolic 983 382 505  Diastolic 77 70 78  Pulse 80 72 74     Physical Exam Vitals and nursing note reviewed.  Constitutional:      Appearance: Normal appearance. She is well-developed and well-groomed. She is morbidly obese.  HENT:     Head:  Normocephalic and atraumatic.     Right Ear: External ear normal.     Left Ear: External ear normal.     Mouth/Throat:     Comments: Mask in place Eyes:     General:        Right eye: No discharge.        Left eye: No discharge.     Conjunctiva/sclera: Conjunctivae normal.  Cardiovascular:     Rate and Rhythm: Normal rate and regular rhythm.     Pulses: Normal pulses.     Heart sounds: Normal heart sounds.  Pulmonary:     Effort: Pulmonary effort is normal.     Breath sounds: Decreased air movement present. Examination of the right-middle field reveals wheezing. Examination of the left-middle field reveals wheezing. Wheezing present.  Musculoskeletal:        General: Normal range of motion.     Cervical back: Normal range of motion and neck supple.  Skin:    General: Skin is warm.  Neurological:     General: No focal deficit present.     Mental Status: She is alert and oriented to person, place, and time.  Psychiatric:        Attention and Perception: Attention normal.        Mood and Affect: Mood normal.        Speech: Speech normal.        Behavior: Behavior normal. Behavior is cooperative.        Thought Content: Thought content normal.        Cognition and Memory: Cognition normal.        Judgment: Judgment normal.     Assessment   1. Morbid obesity ([place])   2. Diabetes mellitus without complication ([place])   3. Chronic obstructive pulmonary disease, unspecified COPD type ([place])   4. Nicotine abuse   5. Wheezing     Tests ordered Orders Placed This Encounter  Procedures  . POCT glycosylated hemoglobin (Hb A1C)     Plan: Please see assessment and plan per problem list above.   No orders of the defined types were placed in this encounter.   Patient to follow-up in 10/26/2020   Perlie Mayo, NP

## 2020-05-26 NOTE — Assessment & Plan Note (Signed)
Not well controlled, wheezing today in the office.  Reports she is still smoking half a pack a day.  Strongly encourage smoking cessation.  No need of refill inhalers at this time.  Would probably benefit from being on a Symbicort or the like in the near future.

## 2020-05-26 NOTE — Assessment & Plan Note (Signed)
Asked about quitting: confirms they are currently smokes cigarettes Advise to quit smoking: Educated about QUITTING to reduce the risk of cancer, cardio and cerebrovascular disease. Assess willingness: Unwilling to quit at this time, but is working on cutting back. Assist with counseling and pharmacotherapy: Counseled for 5 minutes and literature provided. Arrange for follow up:  not quitting follow up in 3 months and continue to offer help.   

## 2020-05-26 NOTE — Assessment & Plan Note (Signed)
Obesity is linked to diabetes, hyperlipidemia  deteriorated,   Michaela Aguilar is re-educated about the importance of exercise daily to help with weight management. A minumum of 30 minutes daily is recommended. Additionally, importance of healthy food choices  with portion control discussed.   Wt Readings from Last 3 Encounters:  05/26/20 223 lb (101.2 kg)  02/04/20 215 lb (97.5 kg)  11/04/19 231 lb 1.9 oz (104.8 kg)

## 2020-05-26 NOTE — Assessment & Plan Note (Signed)
There is noted wheezing today.  She reports using her inhaler and still smoking at this time.  She strongly encouraged smoking cessation

## 2020-05-26 NOTE — Assessment & Plan Note (Signed)
A1c stable at 6.2%.  We will continue current medications as prescribed.  On statin medication.  Encouraged to make sure she eats a well-balanced diabetic friendly diet and exercise.  In addition to smoking cessation.

## 2020-08-04 ENCOUNTER — Other Ambulatory Visit: Payer: Self-pay | Admitting: Family Medicine

## 2020-08-04 DIAGNOSIS — J441 Chronic obstructive pulmonary disease with (acute) exacerbation: Secondary | ICD-10-CM

## 2020-08-04 DIAGNOSIS — R062 Wheezing: Secondary | ICD-10-CM

## 2020-08-05 ENCOUNTER — Telehealth: Payer: Self-pay | Admitting: Family Medicine

## 2020-08-05 ENCOUNTER — Other Ambulatory Visit: Payer: Self-pay | Admitting: *Deleted

## 2020-08-05 DIAGNOSIS — R062 Wheezing: Secondary | ICD-10-CM

## 2020-08-05 DIAGNOSIS — J441 Chronic obstructive pulmonary disease with (acute) exacerbation: Secondary | ICD-10-CM

## 2020-08-05 MED ORDER — ALBUTEROL SULFATE HFA 108 (90 BASE) MCG/ACT IN AERS: INHALATION_SPRAY | RESPIRATORY_TRACT | 0 refills | Status: DC

## 2020-08-05 NOTE — Telephone Encounter (Signed)
Inhaler sent to pt pharmacy

## 2020-08-05 NOTE — Telephone Encounter (Signed)
Patient needing refills on her inhaler sent to [facility] phone is [phone]

## 2020-09-16 ENCOUNTER — Other Ambulatory Visit: Payer: Self-pay | Admitting: Family Medicine

## 2020-09-16 DIAGNOSIS — E119 Type 2 diabetes mellitus without complications: Secondary | ICD-10-CM

## 2020-09-16 DIAGNOSIS — E785 Hyperlipidemia, unspecified: Secondary | ICD-10-CM

## 2020-09-16 DIAGNOSIS — E1169 Type 2 diabetes mellitus with other specified complication: Secondary | ICD-10-CM

## 2020-10-06 IMAGING — DX DG ABDOMEN 1V
2 series · 2 of 2 positions shown · non-contrast
Comparison: Concomitant renal ultrasound performed on the same day
as well as prior CT from 03/18/2018.

CLINICAL DATA: Initial evaluation for renal calculus with abdominal
pain.

EXAM:
ABDOMEN - 1 VIEW

[abdomen kub (1 of 2)]
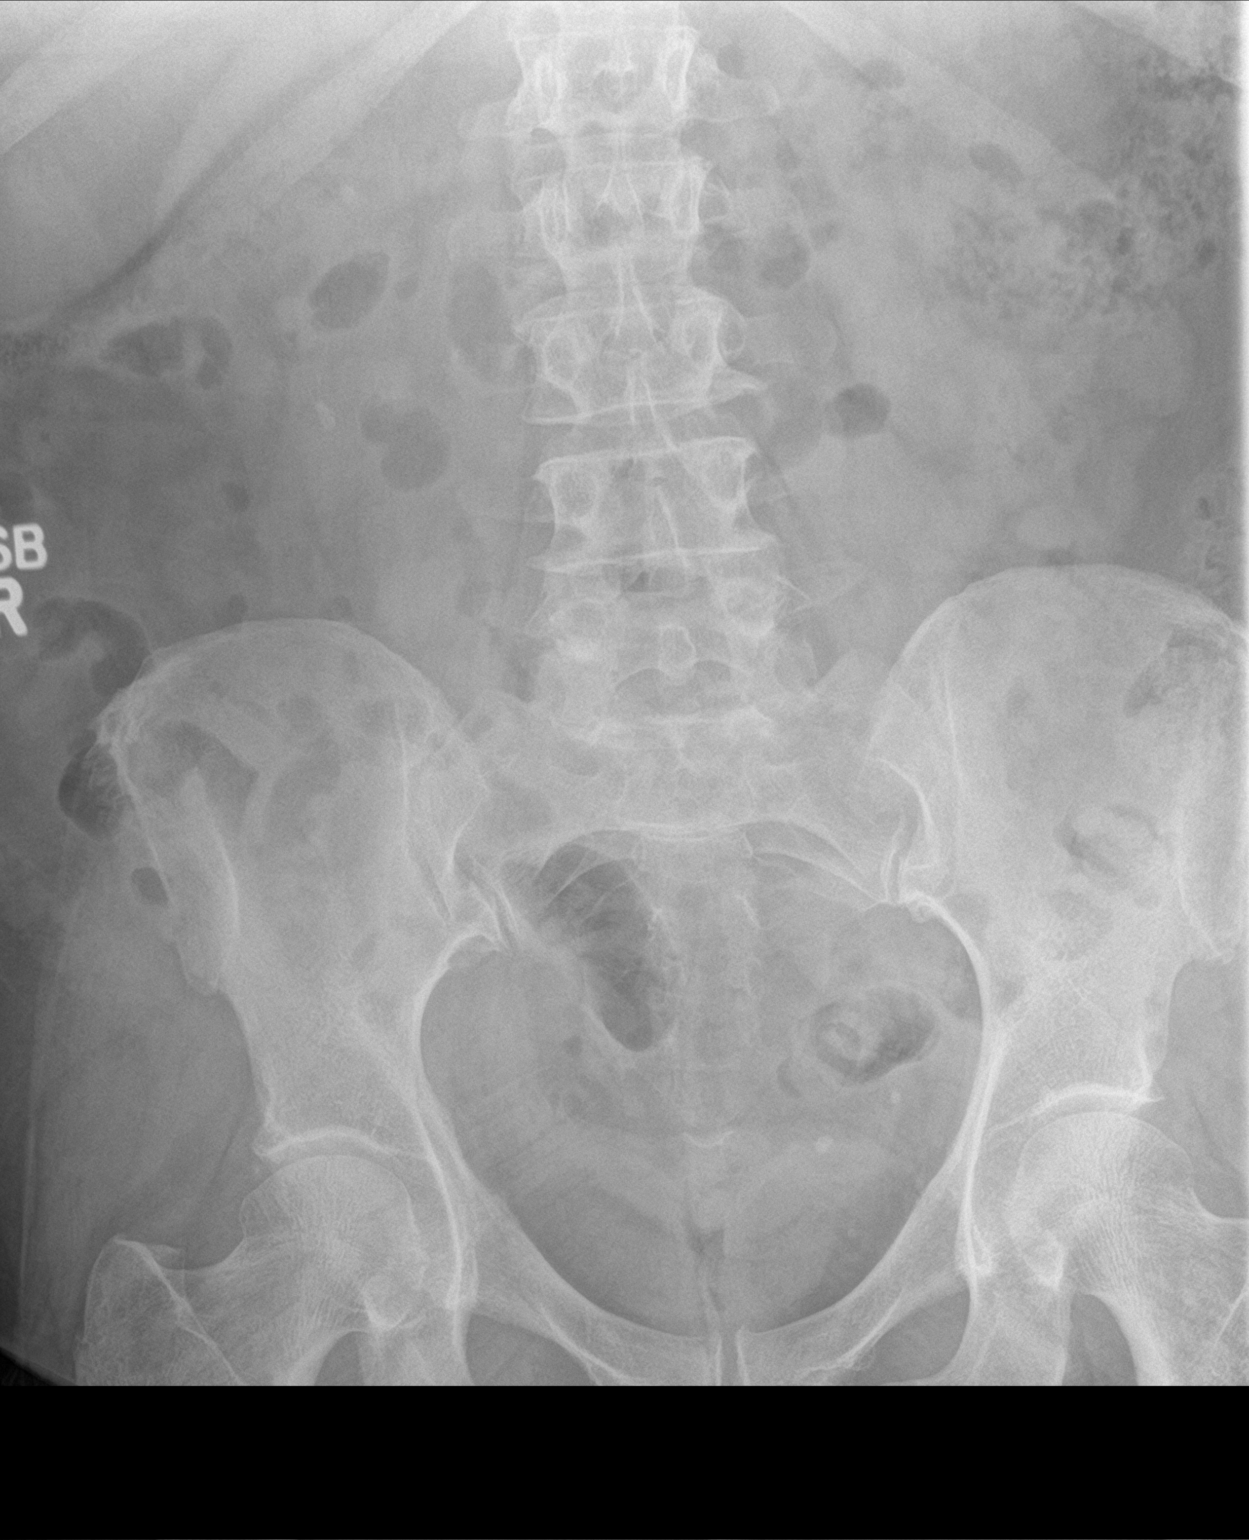

[abdomen kub (2 of 2)]
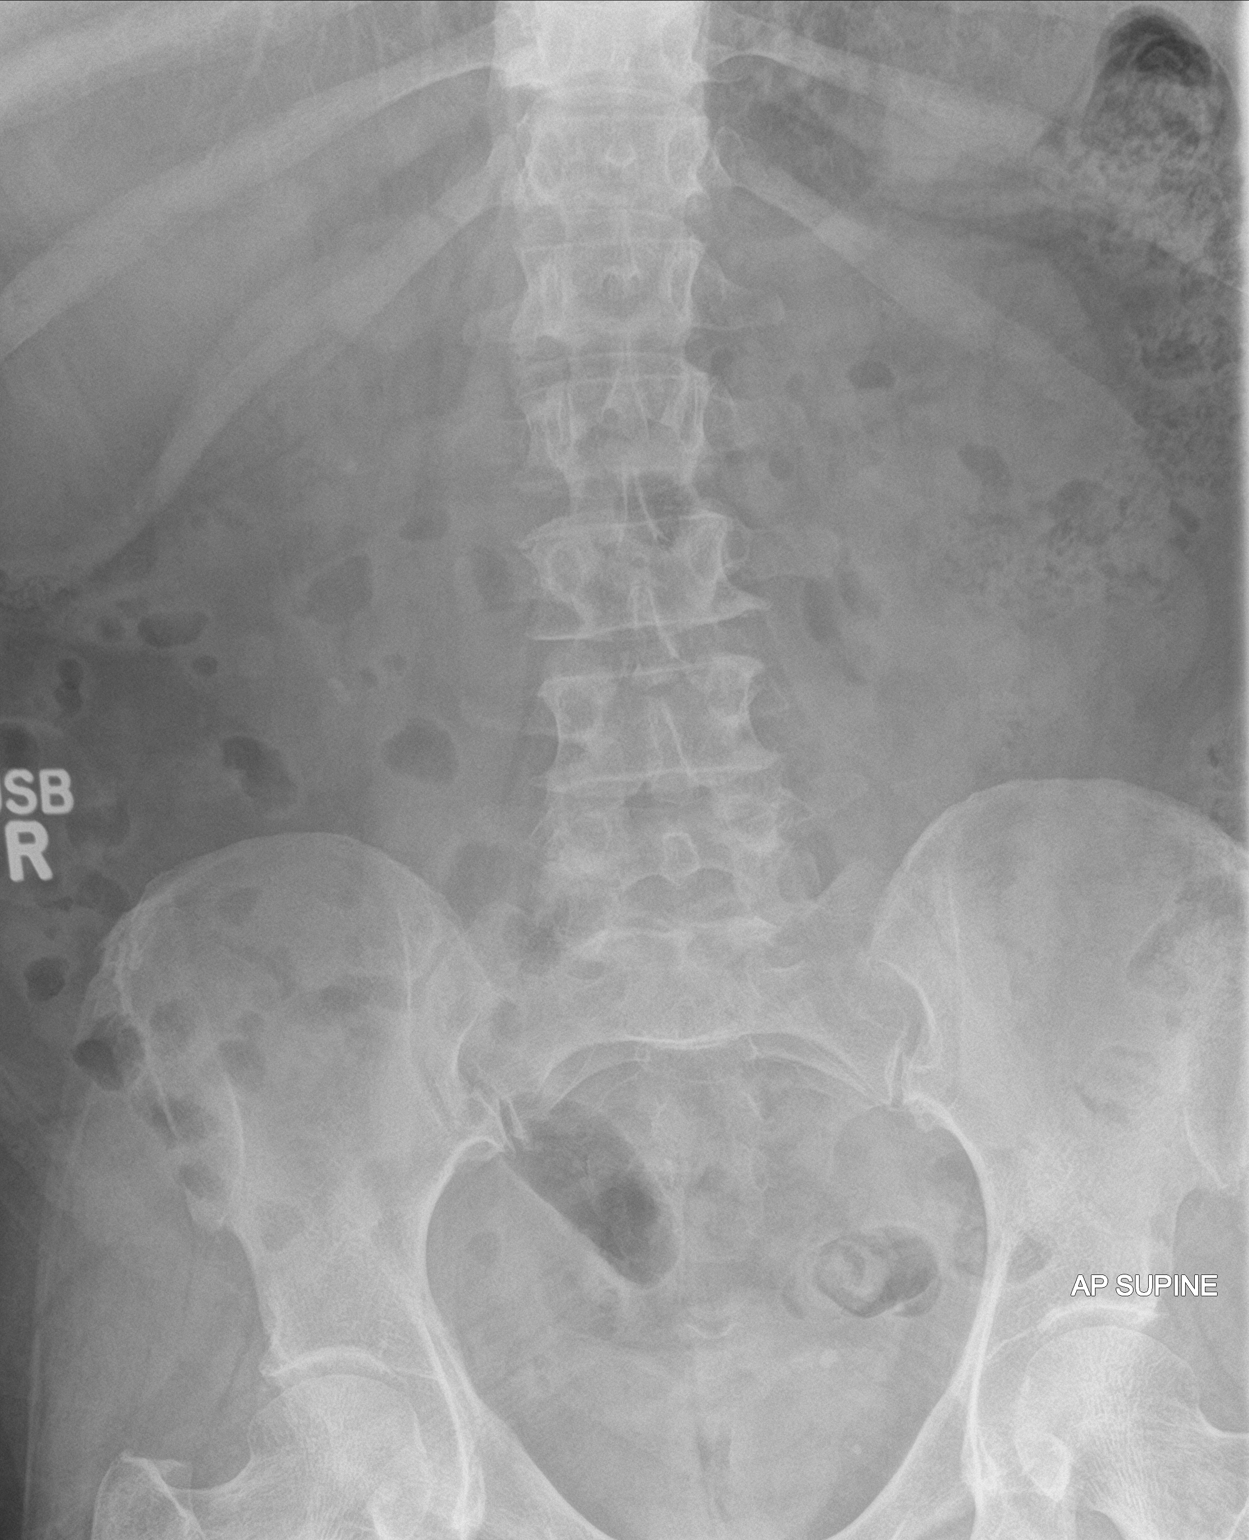

[2 of 2 positions shown; findings below may reference images not displayed]

FINDINGS: 9 mm calcific density overlies the lower aspect of the right renal
shadow, consistent with nephrolithiasis. Additional 4 mm calculus
overlies the mid-upper pole of the right kidney. On the left, 7 mm
calcific density overlies the interpolar region of the left kidney.
No other definite radiopaque calculi identified. No stones seen
along the expected course of either renal collecting system. Few
small calcifications overlying the left hemipelvis most likely
reflect vascular phleboliths.

Visualized bowel gas pattern within normal limits without
obstruction or ileus. No abnormal bowel wall thickening. No other
soft tissue mass or abnormal calcification.

Visualized osseous structures demonstrate no acute finding.
Osteoarthritic changes noted about the hips bilaterally.
IMPRESSION: 1. Bilateral nephrolithiasis as above.
2. No other radiographic evidence for acute abnormality within the
abdomen.

## 2020-10-09 ENCOUNTER — Other Ambulatory Visit: Payer: Self-pay | Admitting: Family Medicine

## 2020-10-09 DIAGNOSIS — R110 Nausea: Secondary | ICD-10-CM

## 2020-10-15 ENCOUNTER — Other Ambulatory Visit (HOSPITAL_COMMUNITY): Payer: Self-pay | Admitting: Family Medicine

## 2020-10-15 DIAGNOSIS — Z1231 Encounter for screening mammogram for malignant neoplasm of breast: Secondary | ICD-10-CM

## 2020-10-15 DIAGNOSIS — Z23 Encounter for immunization: Secondary | ICD-10-CM | POA: Diagnosis not present

## 2020-10-26 ENCOUNTER — Encounter: Payer: Medicaid Other | Admitting: Family Medicine

## 2020-11-03 ENCOUNTER — Encounter: Payer: Self-pay | Admitting: Family Medicine

## 2020-11-03 ENCOUNTER — Ambulatory Visit (INDEPENDENT_AMBULATORY_CARE_PROVIDER_SITE_OTHER): Payer: Medicaid Other | Admitting: Family Medicine

## 2020-11-03 ENCOUNTER — Encounter (INDEPENDENT_AMBULATORY_CARE_PROVIDER_SITE_OTHER): Payer: Self-pay | Admitting: *Deleted

## 2020-11-03 ENCOUNTER — Other Ambulatory Visit: Payer: Self-pay

## 2020-11-03 VITALS — BP 116/68 | HR 84 | Temp 97.6°F | Ht 65.0 in | Wt 239.0 lb

## 2020-11-03 DIAGNOSIS — E559 Vitamin D deficiency, unspecified: Secondary | ICD-10-CM

## 2020-11-03 DIAGNOSIS — Z0001 Encounter for general adult medical examination with abnormal findings: Secondary | ICD-10-CM | POA: Diagnosis not present

## 2020-11-03 DIAGNOSIS — E785 Hyperlipidemia, unspecified: Secondary | ICD-10-CM | POA: Diagnosis not present

## 2020-11-03 DIAGNOSIS — E1169 Type 2 diabetes mellitus with other specified complication: Secondary | ICD-10-CM

## 2020-11-03 DIAGNOSIS — J449 Chronic obstructive pulmonary disease, unspecified: Secondary | ICD-10-CM

## 2020-11-03 DIAGNOSIS — Z720 Tobacco use: Secondary | ICD-10-CM

## 2020-11-03 DIAGNOSIS — E119 Type 2 diabetes mellitus without complications: Secondary | ICD-10-CM | POA: Diagnosis not present

## 2020-11-03 DIAGNOSIS — Z1211 Encounter for screening for malignant neoplasm of colon: Secondary | ICD-10-CM

## 2020-11-03 DIAGNOSIS — R112 Nausea with vomiting, unspecified: Secondary | ICD-10-CM

## 2020-11-03 MED ORDER — ALBUTEROL SULFATE HFA 108 (90 BASE) MCG/ACT IN AERS: INHALATION_SPRAY | RESPIRATORY_TRACT | 0 refills | Status: DC

## 2020-11-03 MED ORDER — ONDANSETRON 4 MG PO TBDP: 4.0000 mg | ORAL_TABLET | Freq: Three times a day (TID) | ORAL | 0 refills | Status: DC | PRN

## 2020-11-03 NOTE — Assessment & Plan Note (Signed)
Discussed monthly self breast exams and yearly mammograms; at least 30 minutes of aerobic activity at least 5 days/week and weight-bearing exercise 2x/week; proper sunscreen use reviewed; healthy diet, including goals of calcium and vitamin D intake and alcohol recommendations (less than or equal to 1 drink/day) reviewed; regular seatbelt use; changing batteries in smoke detectors.  Immunization recommendations discussed.  Colonoscopy recommendations reviewed.  

## 2020-11-03 NOTE — Assessment & Plan Note (Signed)
Updated labs ordered.

## 2020-11-03 NOTE — Assessment & Plan Note (Signed)
Michaela Aguilar is encouraged to check blood sugar daily as directed. Continue current medications. Is on statin as well. Educated on importance of maintain a well balanced diabetic friendly diet. Written materials provided today. Foot exam performed today.  She is reminded the importance of maintaining  good blood sugars,  taking medications as directed, daily foot care, annual eye exams. Additionally educated about keeping good control over blood pressure and cholesterol as well.

## 2020-11-03 NOTE — Assessment & Plan Note (Signed)
Asked about quitting: confirms they are currently smokes cigarettes Advise to quit smoking: Educated about QUITTING to reduce the risk of cancer, cardio and cerebrovascular disease. Assess willingness: Unwilling to quit at this time, but is working on cutting back. Assist with counseling and pharmacotherapy: Counseled for 5 minutes and literature provided. Arrange for follow up:  not quitting follow up in 3 months and continue to offer help.   

## 2020-11-03 NOTE — Assessment & Plan Note (Signed)
Hyperlipidemia is associated with type 2 diabetes.  Taking glipizide as well as Lipitor.  Labs ordered today.  Heart healthy diet that is diabetic friendly encouraged.

## 2020-11-03 NOTE — Progress Notes (Signed)
Health Maintenance reviewed -   Immunization History  Administered Date(s) Administered  . Influenza,inj,Quad PF,6+ Mos 08/14/2017, 09/01/2019, 09/13/2020  . Moderna Sars-Covid-2 Vaccination 03/13/2020, 04/10/2020, 10/15/2020   Last Pap smear: At [facility]  Last mammogram: coming up1/24/22 Last colonoscopy: GI ordered today  Last DEXA: Due at 33  Dentist: Twice yearly Ophtho: [place] ophtho Exercise: No  Smoker: Smokes around 1/2 or less daily  Alcohol Use: Limited   Other doctors caring for patient include:  Patient Care Team: Perlie Mayo, NP as PCP - General (Family Medicine)  End of Life Discussion:  Patient does not have a living will and medical power of attorney  Subjective:   HPI  Michaela Aguilar is a 54 y.o. female who presents for annual comprehensive visit and follow-up on chronic medical conditions.  She has the following concerns: none  She denies having any trouble sleeping.  Denies any changes in bowel or bladder habits.  Denies any blood in urine or stool.  Denies having any headaches or dizziness.  Denies having any cough or shortness of breath.  Denies having any sinus-like symptoms.  Denies have any recent falls or injuries.  Denies having any skin or hearing or vision changes.    Review Of Systems  Review of Systems  Constitutional: Negative.   HENT: Negative.   Eyes: Negative.   Respiratory: Negative.   Cardiovascular: Negative.   Gastrointestinal: Negative.   Endocrine: Negative.   Genitourinary: Negative.   Musculoskeletal: Negative.   Skin: Negative.   Neurological: Negative.   Psychiatric/Behavioral: Negative.     Objective:   PHYSICAL EXAM:  BP 116/68 (BP Location: Right Arm, Patient Position: Sitting, Cuff Size: Normal)   Pulse 84   Temp 97.6 F (36.4 C) (Temporal)   Ht 5\' 5"  (1.651 m)   Wt 239 lb (108.4 kg)   LMP 02/16/2014   SpO2 97%   BMI 39.77 kg/m    Physical Exam Vitals and nursing note reviewed.  Constitutional:       Appearance: Normal appearance. She is well-developed and well-groomed. She is morbidly obese.  HENT:     Head: Normocephalic.     Right Ear: Hearing, tympanic membrane, ear canal and external ear normal.     Left Ear: Hearing, tympanic membrane, ear canal and external ear normal.     Nose: Nose normal.     Mouth/Throat:     Lips: Pink.     Mouth: Mucous membranes are moist.     Pharynx: Oropharynx is clear. Uvula midline.  Eyes:     General: Lids are normal.     Extraocular Movements: Extraocular movements intact.     Conjunctiva/sclera: Conjunctivae normal.     Pupils: Pupils are equal, round, and reactive to light.  Neck:     Thyroid: No thyroid mass, thyromegaly or thyroid tenderness.     Vascular: No carotid bruit.  Cardiovascular:     Rate and Rhythm: Normal rate and regular rhythm.     Pulses: Normal pulses.          Radial pulses are 2+ on the right side and 2+ on the left side.       Dorsalis pedis pulses are 2+ on the right side and 2+ on the left side.     Heart sounds: Normal heart sounds.  Pulmonary:     Effort: Pulmonary effort is normal.     Breath sounds: Normal breath sounds and air entry. No decreased air movement.  Abdominal:  General: Abdomen is flat. Bowel sounds are normal.     Palpations: Abdomen is soft.     Tenderness: There is no abdominal tenderness. There is no right CVA tenderness or left CVA tenderness.     Hernia: No hernia is present.  Musculoskeletal:        General: Normal range of motion.     Cervical back: Full passive range of motion without pain, normal range of motion and neck supple.     Right lower leg: No edema.     Left lower leg: No edema.     Comments: MAE, ROM intact   Lymphadenopathy:     Cervical: No cervical adenopathy.  Skin:    General: Skin is warm and dry.     Capillary Refill: Capillary refill takes less than 2 seconds.  Neurological:     General: No focal deficit present.     Mental Status: She is alert and  oriented to person, place, and time. Mental status is at baseline.     Cranial Nerves: Cranial nerves are intact.     Sensory: Sensation is intact.     Motor: Motor function is intact.     Gait: Gait is intact.     Deep Tendon Reflexes: Reflexes are normal and symmetric.  Psychiatric:        Attention and Perception: Attention normal.        Mood and Affect: Mood normal.        Behavior: Behavior normal. Behavior is cooperative.        Thought Content: Thought content normal.        Judgment: Judgment normal.      Depression Screening  Depression screen [facility] 2/9 11/03/2020 05/26/2020 11/04/2019 01/25/2017 01/25/2017  Decreased Interest 0 0 0 0 0  Down, Depressed, Hopeless 0 0 0 0 0  PHQ - 2 Score 0 0 0 0 0     Falls  Fall Risk  11/03/2020 05/26/2020 11/04/2019 01/25/2017 01/25/2017  Falls in the past year? 0 0 0 No No  Number falls in past yr: 0 0 0 - -  Injury with Fall? 0 0 0 - -  Risk for fall due to : No Fall Risks No Fall Risks - Other (Comment) Other (Comment)  Follow up Falls evaluation completed Falls evaluation completed - - -    Assessment & Plan:   1. Annual visit for general adult medical examination with abnormal findings   2. Morbid obesity ([place])   3. Diabetes mellitus without complication ([place])   4. Hyperlipidemia associated with type 2 diabetes mellitus ([place])   5. Chronic obstructive pulmonary disease, unspecified COPD type ([place])   6. Vitamin D deficiency   7. Mild nausea and vomiting   8. Encounter for screening for malignant neoplasm of colon   9. Nicotine abuse     Tests ordered Orders Placed This Encounter  Procedures  . CBC  . Comprehensive metabolic panel  . Hemoglobin A1c  . Lipid panel  . TSH  . VITAMIN D 25 Hydroxy (Vit-D Deficiency, Fractures)  . Ambulatory referral to Gastroenterology     Plan: Please see assessment and plan per problem list above.   Meds ordered this encounter  Medications  . ondansetron (ZOFRAN ODT) 4 MG  disintegrating tablet    Sig: Take 1 tablet (4 mg total) by mouth every 8 (eight) hours as needed for nausea or vomiting.    Dispense:  20 tablet    Refill:  0  Order Specific Question:   Supervising Provider    Answer:   Fayrene Helper [id number]  . albuterol (PROAIR HFA) 108 (90 Base) MCG/ACT inhaler    Sig: INHALE 2 PUFFS INTO LUNGS EVERY 6 HOURS AS NEEDED.    Dispense:  8.5 g    Refill:  0    Order Specific Question:   Supervising Provider    Answer:   Tula Nakayama E [[id number]]     I have personally reviewed: The patient's medical and social history Their use of alcohol, tobacco or illicit drugs Their current medications and supplements The patient's functional ability including ADLs,fall risks, home safety risks, cognitive, and hearing and visual impairment Diet and physical activities Evidence for depression or mood disorders  The patient's weight, height, BMI, and visual acuity have been recorded in the chart.  I have made referrals, counseling, and provided education to the patient based on review of the above and I have provided the patient with a written personalized care plan for preventive services.     Perlie Mayo, NP   11/03/2020

## 2020-11-03 NOTE — Assessment & Plan Note (Signed)
Overall doing okay PE was unremarkable for wheezing today.  Reports that she still smokes half a pack a day.  Smoking cessation is encouraged.  Refill of inhaler provided.  At this time she would like to refrain from Symbicort.  But possibly will need in the future she continues to smoke as well.

## 2020-11-03 NOTE — Assessment & Plan Note (Signed)
Obesity is linked to type 2 diabetes, hyperlipidemia  Deteriorated   Michaela Aguilar is re-educated about the importance of exercise daily to help with weight management. A minumum of 30 minutes daily is recommended. Additionally, importance of healthy food choices  with portion control discussed.   Wt Readings from Last 3 Encounters:  11/03/20 239 lb (108.4 kg)  05/26/20 223 lb (101.2 kg)  02/04/20 215 lb (97.5 kg)

## 2020-11-03 NOTE — Patient Instructions (Signed)
  I appreciate the opportunity to provide you with care for your health and wellness. Today we discussed: overall health   Follow up: 1 year for cpe fasting and as needed  Labs-fasting today Referrals To GI for colonoscopy   GOALS: Work on exercising more, eating healthy, and reducing smoking :)  Merry Christmas and [place]!  Please continue to practice social distancing to keep you, your family, and our community safe.  If you must go out, please wear a mask and practice good handwashing.  It was a pleasure to see you and I look forward to continuing to work together on your health and well-being. Please do not hesitate to call the office if you need care or have questions about your care.  Have a wonderful day. With Gratitude, Cherly Beach, DNP, AGNP-BC  HEALTH MAINTENANCE RECOMMENDATIONS:  It is recommended that you get at least 30 minutes of aerobic exercise at least 5 days/week (for weight loss, you may need as much as 60-90 minutes). This can be any activity that gets your heart rate up. This can be divided in 10-15 minute intervals if needed, but try and build up your endurance at least once a week.  Weight bearing exercise is also recommended twice weekly.  Eat a healthy diet with lots of vegetables, fruits and fiber.  "Colorful" foods have a lot of vitamins (ie green vegetables, tomatoes, red peppers, etc).  Limit sweet tea, regular sodas and alcoholic beverages, all of which has a lot of calories and sugar.  Up to 1 alcoholic drink daily may be beneficial for women (unless trying to lose weight, watch sugars).  Drink a lot of water.  Calcium recommendations are 1200-1500 mg daily (1500 mg for postmenopausal women or women without ovaries), and vitamin D 1000 IU daily.  This should be obtained from diet and/or supplements (vitamins), and calcium should not be taken all at once, but in divided doses.  Monthly self breast exams and yearly mammograms for women over the age  of 69 is recommended.  Sunscreen of at least SPF 30 should be used on all sun-exposed parts of the skin when outside between the hours of 10 am and 4 pm (not just when at beach or pool, but even with exercise, golf, tennis, and yard work!)  Use a sunscreen that says "broad spectrum" so it covers both UVA and UVB rays, and make sure to reapply every 1-2 hours.  Remember to change the batteries in your smoke detectors when changing your clock times in the spring and fall.  Use your seat belt every time you are in a car, and please drive safely and not be distracted with cell phones and texting while driving.

## 2020-11-04 LAB — COMPREHENSIVE METABOLIC PANEL
ALT: 25 IU/L (ref 0–32)
AST: 17 IU/L (ref 0–40)
Albumin/Globulin Ratio: 1.8 (ref 1.2–2.2)
Albumin: 4.4 g/dL (ref 3.8–4.9)
Alkaline Phosphatase: 88 IU/L (ref 44–121)
BUN/Creatinine Ratio: 18 (ref 9–23)
BUN: 12 mg/dL (ref 6–24)
Bilirubin Total: 0.5 mg/dL (ref 0.0–1.2)
CO2: 25 mmol/L (ref 20–29)
Calcium: 10.1 mg/dL (ref 8.7–10.2)
Chloride: 98 mmol/L (ref 96–106)
Creatinine, Ser: 0.68 mg/dL (ref 0.57–1.00)
GFR calc Af Amer: 113 mL/min/{1.73_m2} (ref >59)
GFR calc non Af Amer: 98 mL/min/{1.73_m2} (ref >59)
Globulin, Total: 2.5 g/dL (ref 1.5–4.5)
Glucose: 176 mg/dL — ABNORMAL HIGH (ref 65–99)
Potassium: 5 mmol/L (ref 3.5–5.2)
Sodium: 138 mmol/L (ref 134–144)
Total Protein: 6.9 g/dL (ref 6.0–8.5)

## 2020-11-04 LAB — CBC
Hematocrit: 42.1 % (ref 34.0–46.6)
Hemoglobin: 14.3 g/dL (ref 11.1–15.9)
MCH: 32.9 pg (ref 26.6–33.0)
MCHC: 34 g/dL (ref 31.5–35.7)
MCV: 97 fL (ref 79–97)
Platelets: 326 10*3/uL (ref 150–450)
RBC: 4.34 x10E6/uL (ref 3.77–5.28)
RDW: 11.8 % (ref 11.7–15.4)
WBC: 6.9 10*3/uL (ref 3.4–10.8)

## 2020-11-04 LAB — LIPID PANEL
Chol/HDL Ratio: 4.2 ratio (ref 0.0–4.4)
Cholesterol, Total: 192 mg/dL (ref 100–199)
HDL: 46 mg/dL (ref >39)
LDL Chol Calc (NIH): 112 mg/dL — ABNORMAL HIGH (ref 0–99)
Triglycerides: 195 mg/dL — ABNORMAL HIGH (ref 0–149)
VLDL Cholesterol Cal: 34 mg/dL (ref 5–40)

## 2020-11-04 LAB — HEMOGLOBIN A1C
Est. average glucose Bld gHb Est-mCnc: 157 mg/dL
Hgb A1c MFr Bld: 7.1 % — ABNORMAL HIGH (ref 4.8–5.6)

## 2020-11-04 LAB — VITAMIN D 25 HYDROXY (VIT D DEFICIENCY, FRACTURES): Vit D, 25-Hydroxy: 21.5 ng/mL — ABNORMAL LOW (ref 30.0–100.0)

## 2020-11-04 LAB — TSH: TSH: 1.24 u[IU]/mL (ref 0.450–4.500)

## 2020-11-08 ENCOUNTER — Other Ambulatory Visit: Payer: Self-pay

## 2020-11-08 DIAGNOSIS — E1169 Type 2 diabetes mellitus with other specified complication: Secondary | ICD-10-CM

## 2020-11-08 DIAGNOSIS — E119 Type 2 diabetes mellitus without complications: Secondary | ICD-10-CM

## 2020-11-08 DIAGNOSIS — E559 Vitamin D deficiency, unspecified: Secondary | ICD-10-CM

## 2020-11-08 MED ORDER — GLIPIZIDE 5 MG PO TABS: 5.0000 mg | ORAL_TABLET | Freq: Every day | ORAL | 0 refills | Status: DC

## 2020-11-08 MED ORDER — VITAMIN D (ERGOCALCIFEROL) 1.25 MG (50000 UNIT) PO CAPS: 50000.0000 [IU] | ORAL_CAPSULE | ORAL | 1 refills | Status: DC

## 2020-11-08 MED ORDER — ATORVASTATIN CALCIUM 40 MG PO TABS: 40.0000 mg | ORAL_TABLET | Freq: Every day | ORAL | 0 refills | Status: DC

## 2020-11-09 ENCOUNTER — Telehealth: Payer: Self-pay

## 2020-11-09 NOTE — Telephone Encounter (Signed)
Yes- I will be adjusting her medications. Please let her know I was out of the office due to holiday, so medications should be ready tomorrow or Thursday.

## 2020-11-09 NOTE — Telephone Encounter (Signed)
Pt called asking if her Glipizide was supposed to be increased? She is currently on 5mg . Please advise.

## 2020-11-09 NOTE — Telephone Encounter (Signed)
Pt informed

## 2020-11-10 ENCOUNTER — Other Ambulatory Visit: Payer: Self-pay | Admitting: Family Medicine

## 2020-11-10 DIAGNOSIS — E119 Type 2 diabetes mellitus without complications: Secondary | ICD-10-CM

## 2020-11-10 DIAGNOSIS — R030 Elevated blood-pressure reading, without diagnosis of hypertension: Secondary | ICD-10-CM

## 2020-11-10 MED ORDER — GLIPIZIDE 5 MG PO TABS: 5.0000 mg | ORAL_TABLET | Freq: Two times a day (BID) | ORAL | 1 refills | Status: DC

## 2020-11-10 MED ORDER — LISINOPRIL 10 MG PO TABS: 10.0000 mg | ORAL_TABLET | Freq: Every day | ORAL | 1 refills | Status: DC

## 2020-11-17 ENCOUNTER — Telehealth: Payer: Self-pay

## 2020-11-17 NOTE — Telephone Encounter (Signed)
That was refilled on 12/29 when she was in the office. Please have her contact the pharmacy. If they do not have it, have her to ask them to let us know. Thanks

## 2020-11-17 NOTE — Telephone Encounter (Signed)
Patient needing a refill on her lisinopril p# [phone]

## 2020-12-06 ENCOUNTER — Ambulatory Visit (HOSPITAL_COMMUNITY)
Admission: RE | Admit: 2020-12-06 | Discharge: 2020-12-06 | Disposition: A | Discharge status: Home / Self Care | Payer: Medicaid Other | Source: Ambulatory Visit | Attending: Family Medicine | Admitting: Family Medicine

## 2020-12-06 ENCOUNTER — Other Ambulatory Visit: Payer: Self-pay

## 2020-12-06 DIAGNOSIS — Z1231 Encounter for screening mammogram for malignant neoplasm of breast: Secondary | ICD-10-CM | POA: Diagnosis not present

## 2021-01-03 ENCOUNTER — Other Ambulatory Visit: Payer: Self-pay | Admitting: Family Medicine

## 2021-01-03 ENCOUNTER — Telehealth: Payer: Self-pay

## 2021-01-03 DIAGNOSIS — J449 Chronic obstructive pulmonary disease, unspecified: Secondary | ICD-10-CM

## 2021-01-03 NOTE — Telephone Encounter (Signed)
She should have a 6 month visit from her Dec appt to recheck A1c at least.

## 2021-01-03 NOTE — Telephone Encounter (Signed)
PT wants to know if she needs her labs redone to check her a1c and cholestorol - she feels ok, just wanted to be sure she could wait til wait till Dec

## 2021-01-03 NOTE — Telephone Encounter (Signed)
Pt needs albuteral refilled

## 2021-01-03 NOTE — Telephone Encounter (Signed)
Rx refilled.

## 2021-01-03 NOTE — Telephone Encounter (Signed)
Please advise 

## 2021-01-03 NOTE — Telephone Encounter (Signed)
Thanks Patient is scheduled June 1

## 2021-01-03 NOTE — Telephone Encounter (Signed)
Pt informed

## 2021-01-17 ENCOUNTER — Other Ambulatory Visit: Payer: Self-pay | Admitting: Family Medicine

## 2021-01-17 DIAGNOSIS — E119 Type 2 diabetes mellitus without complications: Secondary | ICD-10-CM

## 2021-01-17 DIAGNOSIS — E1169 Type 2 diabetes mellitus with other specified complication: Secondary | ICD-10-CM

## 2021-01-26 ENCOUNTER — Telehealth: Payer: Self-pay | Admitting: Urology

## 2021-01-26 NOTE — Telephone Encounter (Signed)
Pt called today to schedule an appt with [facility]. She thinks that she has kidney stones again as she is hurting in her back and side. She wasn't sure if he would want her to do an x-ray before he saw her. Let me know if she needs to be schedule and where I can put her as his schedule is full. Thanks!

## 2021-01-27 NOTE — Telephone Encounter (Signed)
Renal US and OV scheduled. Patient made aware.

## 2021-01-27 NOTE — Telephone Encounter (Signed)
Patient called and made aware that the renal US needs to be done prior to appt. Patient informed that she can be worked in upon the [place] date.

## 2021-02-01 ENCOUNTER — Ambulatory Visit (HOSPITAL_COMMUNITY)
Admission: RE | Admit: 2021-02-01 | Discharge: 2021-02-01 | Disposition: A | Discharge status: Home / Self Care | Payer: Medicaid Other | Source: Ambulatory Visit | Attending: Urology | Admitting: Urology

## 2021-02-01 DIAGNOSIS — N200 Calculus of kidney: Secondary | ICD-10-CM | POA: Diagnosis not present

## 2021-02-04 ENCOUNTER — Ambulatory Visit (INDEPENDENT_AMBULATORY_CARE_PROVIDER_SITE_OTHER): Payer: Medicaid Other | Admitting: Urology

## 2021-02-04 ENCOUNTER — Encounter: Payer: Self-pay | Admitting: Urology

## 2021-02-04 ENCOUNTER — Other Ambulatory Visit: Payer: Self-pay

## 2021-02-04 VITALS — BP 97/56 | HR 65 | Temp 98.9°F | Resp 16 | Ht 65.0 in | Wt 231.0 lb

## 2021-02-04 DIAGNOSIS — M549 Dorsalgia, unspecified: Secondary | ICD-10-CM

## 2021-02-04 DIAGNOSIS — N200 Calculus of kidney: Secondary | ICD-10-CM | POA: Diagnosis not present

## 2021-02-04 LAB — URINALYSIS, ROUTINE W REFLEX MICROSCOPIC
Bilirubin, UA: NEGATIVE
Glucose, UA: NEGATIVE
Ketones, UA: NEGATIVE
Leukocytes,UA: NEGATIVE
Nitrite, UA: NEGATIVE
Protein,UA: NEGATIVE
Specific Gravity, UA: 1.025 (ref 1.005–1.030)
Urobilinogen, Ur: 2 mg/dL — ABNORMAL HIGH (ref 0.2–1.0)
pH, UA: 7 (ref 5.0–7.5)

## 2021-02-04 LAB — MICROSCOPIC EXAMINATION: Renal Epithel, UA: NONE SEEN /hpf

## 2021-02-04 MED ORDER — OXYCODONE-ACETAMINOPHEN 5-325 MG PO TABS: 1.0000 | ORAL_TABLET | ORAL | 0 refills | Status: DC | PRN

## 2021-02-04 MED ORDER — CYCLOBENZAPRINE HCL 5 MG PO TABS: 5.0000 mg | ORAL_TABLET | Freq: Three times a day (TID) | ORAL | 0 refills | Status: DC | PRN

## 2021-02-04 NOTE — Progress Notes (Signed)
02/04/2021 2:32 PM   Michaela Aguilar 07-30-1966 [id number]  Referring provider: Perlie Mayo, NP [address] [place],  [place] [address]  Chief Complaint  Patient presents with  . Other    Right side pain - ?Kidney stones    HPI: Michaela Aguilar is a 55yo here for followup for nephrolithiasis. No stone events since her last visit over 1 year ago. She has been having intermittent right flank pain that is worse with activity. Pain is alleviated with narcotics. No LUTS. No other associated symptoms. No other exacerbating/alleviaitng events.  Renal US from 3/22 shows no calculi and no hydronephrosis   PMH: Past Medical History:  Diagnosis Date  . Anxiety   . Bronchitis   . Chronic pain of left knee 04/03/2017  . Chronic pain of right knee 08/16/2017  . Depression   . Encounter for screening for malignant neoplasm of cervix 11/08/2019  . Encounter for screening for malignant neoplasm of colon 11/08/2019  . Encounter for screening mammogram for malignant neoplasm of breast 11/08/2019  . Gestational diabetes    1992  . History of kidney stones   . Kidney stones per pt.  . Low back pain 11/17/2015  . Neck pain 11/17/2015    Surgical History: Past Surgical History:  Procedure Laterality Date  . CESAREAN SECTION     x2  . CYSTOSCOPY WITH RETROGRADE PYELOGRAM, URETEROSCOPY AND STENT PLACEMENT Right 08/18/2019   Procedure: CYSTOSCOPY WITH RETROGRADE PYELOGRAM, URETEROSCOPY AND STENT PLACEMENT;  Surgeon: Cleon Gustin, MD;  Location: [facility];  Service: Urology;  Laterality: Right;  . CYSTOSCOPY WITH STENT PLACEMENT Right 09/06/2016   Procedure: CYSTOSCOPY WITH RIGHT URETERAL STENT PLACEMENT;  Surgeon: Cleon Gustin, MD;  Location: [facility];  Service: Urology;  Laterality: Right;  . CYSTOSCOPY/RETROGRADE/URETEROSCOPY/STONE EXTRACTION WITH BASKET Right 09/06/2016   Procedure: CYSTOSCOPY/RIGHT RETROGRADE PYELOGRAM, RIGHT RENAL STONE EXTRACTION WITH LASER;  Surgeon: Cleon Gustin, MD;   Location: [facility];  Service: Urology;  Laterality: Right;  . [place] 2010  . HERNIA REPAIR     incisional hernia  . STONE EXTRACTION WITH BASKET Right 08/18/2019   Procedure: STONE EXTRACTION WITH BASKET;  Surgeon: Cleon Gustin, MD;  Location: [facility];  Service: Urology;  Laterality: Right;    Home Medications:  Allergies as of 02/04/2021   No Known Allergies     Medication List       Accurate as of February 04, 2021  2:32 PM. If you have any questions, ask your nurse or doctor.        albuterol 108 (90 Base) MCG/ACT inhaler Commonly known as: ProAir HFA INHALE 2 PUFFS INTO LUNGS EVERY 6 HOURS AS NEEDED.   atorvastatin 40 MG tablet Commonly known as: LIPITOR TAKE ONE TABLET BY MOUTH ONCE DAILY.   cyclobenzaprine 5 MG tablet Commonly known as: FLEXERIL Take 1 tablet (5 mg total) by mouth 3 (three) times daily as needed for muscle spasms. Started by: Nicolette Bang, MD   famotidine 20 MG tablet Commonly known as: PEPCID Take 1 tablet (20 mg total) by mouth 2 (two) times daily.   Fish Oil 1000 MG Caps Take 1 capsule by mouth in the morning and at bedtime.   glipiZIDE 5 MG tablet Commonly known as: GLUCOTROL Take 1 tablet (5 mg total) by mouth 2 (two) times daily before a meal.   lisinopril 10 MG tablet Commonly known as: ZESTRIL Take 1 tablet (10 mg total) by mouth daily.  multivitamin with minerals tablet Take 1 tablet by mouth daily.   ondansetron 4 MG disintegrating tablet Commonly known as: Zofran ODT Take 1 tablet (4 mg total) by mouth every 8 (eight) hours as needed for nausea or vomiting.   oxyCODONE-acetaminophen 5-325 MG tablet Commonly known as: Percocet Take 1 tablet by mouth every 4 (four) hours as needed. Started by: Nicolette Bang, MD   Vitamin D (Ergocalciferol) 1.25 MG (50000 UNIT) Caps capsule Commonly known as: DRISDOL Take 1 capsule (50,000 Units total) by mouth every 7 (seven) days.        Allergies: No Known Allergies  Family History: Family History  Problem Relation Age of Onset  . Osteoporosis Mother   . Depression Father   . Mental illness Father   . Cancer Maternal Uncle        lung cancer  . COPD Maternal Uncle   . Depression Maternal Uncle   . Emphysema Maternal Grandmother   . Cancer Maternal Grandfather        lung cancer  . Breast cancer Neg Hx     Social History:  reports that she has been smoking cigarettes and cigars. She has a 3.75 pack-year smoking history. She has never used smokeless tobacco. She reports previous alcohol use. She reports that she does not use drugs.  ROS: All other review of systems were reviewed and are negative except what is noted above in HPI  Physical Exam: BP (!) 97/56   Pulse 65   Temp 98.9 F (37.2 C) (Oral)   Resp 16   Ht 5\' 5"  (1.651 m)   Wt 231 lb (104.8 kg)   LMP 02/16/2014   BMI 38.44 kg/m   Constitutional:  Alert and oriented, No acute distress. HEENT: [place] AT, moist mucus membranes.  Trachea midline, no masses. Cardiovascular: No clubbing, cyanosis, or edema. Respiratory: Normal respiratory effort, no increased work of breathing. GI: Abdomen is soft, nontender, nondistended, no abdominal masses GU: No CVA tenderness.  Lymph: No cervical or inguinal lymphadenopathy. Skin: No rashes, bruises or suspicious lesions. Neurologic: Grossly intact, no focal deficits, moving all 4 extremities. Psychiatric: Normal mood and affect.  Laboratory Data: Lab Results  Component Value Date   WBC 6.9 11/03/2020   HGB 14.3 11/03/2020   HCT 42.1 11/03/2020   MCV 97 11/03/2020   PLT 326 11/03/2020    Lab Results  Component Value Date   CREATININE 0.68 11/03/2020    No results found for: PSA  No results found for: TESTOSTERONE  Lab Results  Component Value Date   HGBA1C 7.1 (H) 11/03/2020    Urinalysis    Component Value Date/Time   COLORURINE YELLOW 03/18/2018 1130   APPEARANCEUR Clear 02/04/2021  1335   LABSPEC 1.014 03/18/2018 1130   PHURINE 6.0 03/18/2018 1130   GLUCOSEU Negative 02/04/2021 1335   HGBUR MODERATE (A) 03/18/2018 1130   BILIRUBINUR Negative 02/04/2021 1335   KETONESUR NEGATIVE 03/18/2018 1130   PROTEINUR Negative 02/04/2021 1335   PROTEINUR NEGATIVE 03/18/2018 1130   UROBILINOGEN 0.2 08/28/2015 2130   NITRITE Negative 02/04/2021 1335   NITRITE NEGATIVE 03/18/2018 1130   LEUKOCYTESUR Negative 02/04/2021 1335    Lab Results  Component Value Date   LABMICR See below: 02/04/2021   WBCUA 0-5 02/04/2021   LABEPIT 0-10 02/04/2021   BACTERIA Few 02/04/2021    Pertinent Imaging: Renal US 02/01/2021: Images reviewed and discussed with the patient Results for orders placed during the hospital encounter of 04/15/19  DG Abd 1 View  Narrative  CLINICAL DATA:  Right-sided kidney stones.  Right-sided flank pain.  EXAM: ABDOMEN - 1 VIEW  COMPARISON:  12/11/2018  FINDINGS: There is an 8 mm stone that projects over the expected region of the right renal pelvis. There is an 8 mm stone projecting over the lower pole the right kidney. Multiple phleboliths project over the patient's left hemipelvis. No evidence of left-sided nephrolithiasis on this examination.  IMPRESSION: Right-sided nephrolithiasis as detailed above.   Electronically Signed By: Constance Holster M.D. On: 04/16/2019 03:00  No results found for this or any previous visit.  No results found for this or any previous visit.  No results found for this or any previous visit.  Results for orders placed during the hospital encounter of 02/01/21  US RENAL  Narrative CLINICAL DATA:  Kidney stone  EXAM: RENAL / URINARY TRACT ULTRASOUND COMPLETE  COMPARISON:  October 08, 2019  FINDINGS: Right Kidney:  Renal measurements: 11.7 x 5.4 x 5.8 cm = volume: 192 mL. There is no hydronephrosis. The previously demonstrated right renal calculus is no longer visualized.  Left Kidney:  Renal  measurements: 11.3 x 6 x 6.3 cm = volume: 223 mL. Echogenicity within normal limits. No mass or hydronephrosis visualized.  Bladder:  Appears normal for degree of bladder distention.  Other:  None.  IMPRESSION: Unremarkable examination. The previously demonstrated right renal calculus is not well appreciated on today's study.   Electronically Signed By: Constance Holster M.D. On: 02/02/2021 12:12  No results found for this or any previous visit.  No results found for this or any previous visit.  Results for orders placed during the hospital encounter of 03/18/18  CT Renal Stone Study  Narrative CLINICAL DATA:  Initial evaluation for acute right flank pain.  EXAM: CT ABDOMEN AND PELVIS WITHOUT CONTRAST  TECHNIQUE: Multidetector CT imaging of the abdomen and pelvis was performed following the standard protocol without IV contrast.  COMPARISON:  Prior CT from 08/10/2017.  FINDINGS: Lower chest: Visualized lung bases are clear.  Hepatobiliary: Liver demonstrates a normal unenhanced appearance. Gallbladder contracted without acute abnormality. No biliary dilatation.  Pancreas: Pancreas within normal limits.  Spleen: Spleen unremarkable.  Adrenals/Urinary Tract: 2.3 cm hypodense left adrenal nodule, consistent with a benign adenoma. Adrenals otherwise unremarkable.  Kidneys equal in size. Nonobstructive right renal nephrolithiasis, largest of which measures 7 mm within the lower pole. Additional nonobstructive 4 mm stone present within the upper pole left kidney. No radiopaque calculi seen along the course of either renal collecting system. No hydronephrosis or hydroureter. Partially distended bladder within normal limits. No layering stones within the bladder lumen.  Stomach/Bowel: Stomach within normal limits. No evidence for obstruction. Appendix is normal. Mild colonic diverticulosis without evidence for acute diverticulitis. No acute inflammatory  changes about the bowels.  Vascular/Lymphatic: No aneurysm. Mild atherosclerotic change. No adenopathy.  Reproductive: Uterus and ovaries within normal limits.  Other: No free air or fluid. Small fat containing ventral hernia noted without associated inflammation.  Musculoskeletal: No acute osseus abnormality. No worrisome lytic or blastic osseous lesions.  IMPRESSION: 1. Bilateral nonobstructive nephrolithiasis as above. No CT evidence for obstructive uropathy. 2. No other acute intra-abdominal or pelvic process. 3. Mild colonic diverticulosis without evidence for acute diverticulitis. 4. Stable left adrenal adenoma. 5. Small fat containing supraumbilical hernia without associated inflammation.   Electronically Signed By: Jeannine Boga M.D. On: 03/18/2018 14:00   Assessment & Plan:    1. Kidney stones -RTC 1 year with renal US - Urinalysis, Routine w  reflex microscopic - Ultrasound renal complete; Future  2. Dorsalgia -flexeril prn   Return in about 1 year (around 02/04/2022).  Nicolette Bang, MD  [facility] [place]

## 2021-02-04 NOTE — Patient Instructions (Signed)

## 2021-02-11 ENCOUNTER — Other Ambulatory Visit: Payer: Self-pay | Admitting: Family Medicine

## 2021-02-11 DIAGNOSIS — E119 Type 2 diabetes mellitus without complications: Secondary | ICD-10-CM

## 2021-02-11 DIAGNOSIS — J449 Chronic obstructive pulmonary disease, unspecified: Secondary | ICD-10-CM

## 2021-02-11 DIAGNOSIS — R030 Elevated blood-pressure reading, without diagnosis of hypertension: Secondary | ICD-10-CM

## 2021-02-15 ENCOUNTER — Encounter: Payer: Self-pay | Admitting: Nurse Practitioner

## 2021-03-23 ENCOUNTER — Other Ambulatory Visit: Payer: Self-pay | Admitting: Family Medicine

## 2021-03-30 ENCOUNTER — Telehealth: Payer: Self-pay
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
## 2021-03-31 NOTE — Telephone Encounter (Signed)
My chart message sent to patient to ask for MRI disc

## 2021-03-31 NOTE — Telephone Encounter (Signed)
Please see care everywhere for MRI recently done and reported hydronephrosis.

## 2021-04-12 ENCOUNTER — Telehealth: Payer: Self-pay

## 2021-04-12 NOTE — Telephone Encounter (Signed)
Patient called regarding disk that was dropped off. She asked if Dr. Alyson Ingles has had a chance to look at it?  Hydronephrosis was the diagnosis she got at the hospital.  Please call:  [phone] Jerilynn Mages)   Thanks, Helene Kelp

## 2021-04-12 NOTE — Telephone Encounter (Signed)
Opened in error

## 2021-04-13 ENCOUNTER — Other Ambulatory Visit: Payer: Self-pay

## 2021-04-13 ENCOUNTER — Ambulatory Visit (INDEPENDENT_AMBULATORY_CARE_PROVIDER_SITE_OTHER): Payer: Medicaid Other | Admitting: Nurse Practitioner

## 2021-04-13 ENCOUNTER — Encounter: Payer: Self-pay | Admitting: Nurse Practitioner

## 2021-04-13 DIAGNOSIS — E559 Vitamin D deficiency, unspecified: Secondary | ICD-10-CM

## 2021-04-13 DIAGNOSIS — R112 Nausea with vomiting, unspecified: Secondary | ICD-10-CM | POA: Diagnosis not present

## 2021-04-13 DIAGNOSIS — E785 Hyperlipidemia, unspecified: Secondary | ICD-10-CM | POA: Diagnosis not present

## 2021-04-13 DIAGNOSIS — J449 Chronic obstructive pulmonary disease, unspecified: Secondary | ICD-10-CM

## 2021-04-13 DIAGNOSIS — E1169 Type 2 diabetes mellitus with other specified complication: Secondary | ICD-10-CM | POA: Diagnosis not present

## 2021-04-13 DIAGNOSIS — E119 Type 2 diabetes mellitus without complications: Secondary | ICD-10-CM | POA: Diagnosis not present

## 2021-04-13 DIAGNOSIS — R42 Dizziness and giddiness: Secondary | ICD-10-CM

## 2021-04-13 DIAGNOSIS — R030 Elevated blood-pressure reading, without diagnosis of hypertension: Secondary | ICD-10-CM | POA: Diagnosis not present

## 2021-04-13 MED ORDER — ALBUTEROL SULFATE HFA 108 (90 BASE) MCG/ACT IN AERS
INHALATION_SPRAY | RESPIRATORY_TRACT | 0 refills | Status: DC
Start: 2021-04-13 — End: 2021-05-10

## 2021-04-13 MED ORDER — BLOOD GLUCOSE METER KIT: PACK | 0 refills | Status: AC

## 2021-04-13 NOTE — Assessment & Plan Note (Signed)
Lab Results  Component Value Date   CHOL 192 11/03/2020   HDL 46 11/03/2020   LDLCALC 112 (H) 11/03/2020   TRIG 195 (H) 11/03/2020   CHOLHDL 4.2 11/03/2020  -goal LDL < 70 -ordered lipid panel

## 2021-04-13 NOTE — Telephone Encounter (Signed)
Please advise on CD disc patient left for you to review.

## 2021-04-13 NOTE — Assessment & Plan Note (Signed)
-  refilled albuterol -smokes 1/2 pack per day; cessation recommended

## 2021-04-13 NOTE — Assessment & Plan Note (Signed)
-  BP well controlled today -ortho vitals completed d/t lightheadedness

## 2021-04-13 NOTE — Patient Instructions (Signed)
Please have fasting labs drawn today. 

## 2021-04-13 NOTE — Assessment & Plan Note (Signed)
-  checking A1c today Lab Results  Component Value Date   HGBA1C 7.1 (H) 11/03/2020  -goal A1c < 7 -on statin and ACEi

## 2021-04-13 NOTE — Assessment & Plan Note (Signed)
-  will monitor Vit D levels -had been taking rx vit D; may be able to transition to OTC supplement based on results

## 2021-04-13 NOTE — Assessment & Plan Note (Signed)
-  negative for orthostatic hypotension -HR ranged from 54-64 -since she thinks her symptoms started with glipizide, will check labs; lisinopril shouldn't cause bradycardia -will consider cardiology referral if nothing obvious on labs

## 2021-04-13 NOTE — Progress Notes (Addendum)
Established Patient Office Visit  Subjective:  Patient ID: Michaela Aguilar, female    DOB: 06-03-1966  Age: 55 y.o. MRN: [id number]  CC:  Chief Complaint  Patient presents with  . Diabetes    Follow up - pt is fasting     HPI Michaela Aguilar presents for lab follow-up.  She states that she has been feeling "not well" for the last month.  She states it is kind of like nausea and lightheadedness.  She had it 2-3 days last week. She states that she had some palpitations once every 2-3 weeks, but she is not interested in investigating this.  She thinks her lightheadedness started after restarting glipizide.  Past Medical History:  Diagnosis Date  . Anxiety   . Bronchitis   . Chronic pain of left knee 04/03/2017  . Chronic pain of right knee 08/16/2017  . Depression   . Encounter for screening for malignant neoplasm of cervix 11/08/2019  . Encounter for screening for malignant neoplasm of colon 11/08/2019  . Encounter for screening mammogram for malignant neoplasm of breast 11/08/2019  . Gestational diabetes    1992  . History of kidney stones   . Kidney stones per pt.  . Low back pain 11/17/2015  . Neck pain 11/17/2015    Past Surgical History:  Procedure Laterality Date  . CESAREAN SECTION     x2  . CYSTOSCOPY WITH RETROGRADE PYELOGRAM, URETEROSCOPY AND STENT PLACEMENT Right 08/18/2019   Procedure: CYSTOSCOPY WITH RETROGRADE PYELOGRAM, URETEROSCOPY AND STENT PLACEMENT;  Surgeon: Cleon Gustin, MD;  Location: [facility];  Service: Urology;  Laterality: Right;  . CYSTOSCOPY WITH STENT PLACEMENT Right 09/06/2016   Procedure: CYSTOSCOPY WITH RIGHT URETERAL STENT PLACEMENT;  Surgeon: Cleon Gustin, MD;  Location: [facility];  Service: Urology;  Laterality: Right;  . CYSTOSCOPY/RETROGRADE/URETEROSCOPY/STONE EXTRACTION WITH BASKET Right 09/06/2016   Procedure: CYSTOSCOPY/RIGHT RETROGRADE PYELOGRAM, RIGHT RENAL STONE EXTRACTION WITH LASER;  Surgeon: Cleon Gustin, MD;  Location: [facility];   Service: Urology;  Laterality: Right;  . [place] 2010  . HERNIA REPAIR     incisional hernia  . STONE EXTRACTION WITH BASKET Right 08/18/2019   Procedure: STONE EXTRACTION WITH BASKET;  Surgeon: Cleon Gustin, MD;  Location: [facility];  Service: Urology;  Laterality: Right;    Family History  Problem Relation Age of Onset  . Osteoporosis Mother   . Depression Father   . Mental illness Father   . Cancer Maternal Uncle        lung cancer  . COPD Maternal Uncle   . Depression Maternal Uncle   . Emphysema Maternal Grandmother   . Cancer Maternal Grandfather        lung cancer  . Breast cancer Neg Hx     Social History   Socioeconomic History  . Marital status: Divorced    Spouse name: Not on file  . Number of children: 2  . Years of education: Not on file  . Highest education level: Some college, no degree  Occupational History  . Not on file  Tobacco Use  . Smoking status: Current Every Day Smoker    Packs/day: 0.25    Years: 15.00    Pack years: 3.75    Types: Cigarettes, Cigars  . Smokeless tobacco: Never Used  Vaping Use  . Vaping Use: Never used  Substance and Sexual Activity  . Alcohol use: Not Currently  . Drug use: No    Types: Marijuana  Comment: last marijuana use early October 2017  . Sexual activity: Yes    Birth control/protection: None, Post-menopausal  Other Topics Concern  . Not on file  Social History Narrative   Lives with roommate   2 children (grown)   1 daughter no grandbabies    1 son-2 grandbabies 61 month and 27 year old (daughters)   2 cats: Bella and Tip       Enjoys: swim and ride bike enjoys being outside       Diet:eats all food groups, enjoys bread-air frying more, needs more veggies    Caffeine: soda-sugar free- one daily,    Water: 3-4 cups daily       Wears seat belt   Smoke detectors    Does not use phone while driving    Social Determinants of Health   Financial Resource Strain:  Low Risk   . Difficulty of Paying Living Expenses: Not hard at all  Food Insecurity: No Food Insecurity  . Worried About Charity fundraiser in the Last Year: Never true  . Ran Out of Food in the Last Year: Never true  Transportation Needs: No Transportation Needs  . Lack of Transportation (Medical): No  . Lack of Transportation (Non-Medical): No  Physical Activity: Sufficiently Active  . Days of Exercise per Week: 7 days  . Minutes of Exercise per Session: 30 min  Stress: Not on file  Social Connections: Socially Isolated  . Frequency of Communication with Friends and Family: More than three times a week  . Frequency of Social Gatherings with Friends and Family: More than three times a week  . Attends Religious Services: Never  . Active Member of Clubs or Organizations: No  . Attends Archivist Meetings: Never  . Marital Status: Divorced  Human resources officer Violence: Not on file    Outpatient Medications Prior to Visit  Medication Sig Dispense Refill  . atorvastatin (LIPITOR) 40 MG tablet TAKE ONE TABLET BY MOUTH ONCE DAILY. 90 tablet 0  . cyclobenzaprine (FLEXERIL) 5 MG tablet Take 1 tablet (5 mg total) by mouth 3 (three) times daily as needed for muscle spasms. 30 tablet 0  . famotidine (PEPCID) 20 MG tablet Take 1 tablet (20 mg total) by mouth 2 (two) times daily. 30 tablet 1  . glipiZIDE (GLUCOTROL) 5 MG tablet TAKE (1) TABLET BY MOUTH TWICE DAILY BEFORE MEALS. 180 tablet 0  . lisinopril (ZESTRIL) 10 MG tablet TAKE ONE TABLET BY MOUTH ONCE DAILY. 90 tablet 0  . Multiple Vitamins-Minerals (MULTIVITAMIN WITH MINERALS) tablet Take 1 tablet by mouth daily.    . Omega-3 Fatty Acids (FISH OIL) 1000 MG CAPS Take 1 capsule by mouth in the morning and at bedtime.    . Vitamin D, Ergocalciferol, (DRISDOL) 1.25 MG (50000 UNIT) CAPS capsule Take 1 capsule (50,000 Units total) by mouth every 7 (seven) days. 12 capsule 1  . albuterol (PROAIR HFA) 108 (90 Base) MCG/ACT inhaler INHALE 2  PUFFS INTO LUNGS EVERY 6 HOURS AS NEEDED. 8.5 g 0  . ondansetron (ZOFRAN ODT) 4 MG disintegrating tablet Take 1 tablet (4 mg total) by mouth every 8 (eight) hours as needed for nausea or vomiting. 20 tablet 0  . oxyCODONE-acetaminophen (PERCOCET) 5-325 MG tablet Take 1 tablet by mouth every 4 (four) hours as needed. 30 tablet 0   No facility-administered medications prior to visit.    No Known Allergies  ROS Review of Systems  Constitutional: Negative.   Respiratory: Negative.   Cardiovascular: Negative.  Neurological: Positive for light-headedness.  Psychiatric/Behavioral: Negative.       Objective:    Physical Exam Constitutional:      Appearance: Normal appearance. She is obese.  Cardiovascular:     Rate and Rhythm: Normal rate and regular rhythm.     Pulses: Normal pulses.     Heart sounds: Normal heart sounds.  Pulmonary:     Effort: Pulmonary effort is normal.     Breath sounds: Normal breath sounds.  Neurological:     Mental Status: She is alert.  Psychiatric:        Mood and Affect: Mood normal.        Behavior: Behavior normal.        Thought Content: Thought content normal.        Judgment: Judgment normal.     BP 107/70   Pulse 74   Temp 98.5 F (36.9 C)   Resp 20   Ht _0  (1.651 m)   Wt 224 lb (101.6 kg)   LMP 02/16/2014   SpO2 93%   BMI 37.28 kg/m  Wt Readings from Last 3 Encounters:  04/13/21 224 lb (101.6 kg)  02/04/21 231 lb (104.8 kg)  11/03/20 239 lb (108.4 kg)     Health Maintenance Due  Topic Date Due  . PNEUMOCOCCAL POLYSACCHARIDE VACCINE AGE 13-64 HIGH RISK  Never done  . COLONOSCOPY (Pts 45-93yr Insurance coverage will need to be confirmed)  Never done  . Zoster Vaccines- Shingrix (1 of 2) Never done  . FOOT EXAM  03/28/2019  . OPHTHALMOLOGY EXAM  04/10/2019  . PAP SMEAR-Modifier  10/27/2019    There are no preventive care reminders to display for this patient.  Lab Results  Component Value Date   TSH 1.240 11/03/2020    Lab Results  Component Value Date   WBC 6.9 11/03/2020   HGB 14.3 11/03/2020   HCT 42.1 11/03/2020   MCV 97 11/03/2020   PLT 326 11/03/2020   Lab Results  Component Value Date   NA 138 11/03/2020   K 5.0 11/03/2020   CO2 25 11/03/2020   GLUCOSE 176 (H) 11/03/2020   BUN 12 11/03/2020   CREATININE 0.68 11/03/2020   BILITOT 0.5 11/03/2020   ALKPHOS 88 11/03/2020   AST 17 11/03/2020   ALT 25 11/03/2020   PROT 6.9 11/03/2020   ALBUMIN 4.4 11/03/2020   CALCIUM 10.1 11/03/2020   ANIONGAP 8 07/16/2019   Lab Results  Component Value Date   CHOL 192 11/03/2020   Lab Results  Component Value Date   HDL 46 11/03/2020   Lab Results  Component Value Date   LDLCALC 112 (H) 11/03/2020   Lab Results  Component Value Date   TRIG 195 (H) 11/03/2020   Lab Results  Component Value Date   CHOLHDL 4.2 11/03/2020   Lab Results  Component Value Date   HGBA1C 7.1 (H) 11/03/2020      Assessment & Plan:   Problem List Items Addressed This Visit      Respiratory   [place] obstructive pulmonary disease [place]    -refilled albuterol -smokes 1/2 pack per day; cessation recommended      Relevant Medications   albuterol (PROAIR HFA) 108 (90 Base) MCG/ACT inhaler     Endocrine   Diabetes mellitus without complication (HCC)    -checking A1c today Lab Results  Component Value Date   HGBA1C 7.1 (H) 11/03/2020  -goal A1c < 7 -on statin and ACEi  Relevant Medications   blood glucose meter kit and supplies   Other Relevant Orders   Lipid Panel With LDL/HDL Ratio   Hemoglobin A1c   Microalbumin / creatinine urine ratio   Hyperlipidemia associated with type 2 diabetes mellitus ([place])    Lab Results  Component Value Date   CHOL 192 11/03/2020   HDL 46 11/03/2020   LDLCALC 112 (H) 11/03/2020   TRIG 195 (H) 11/03/2020   CHOLHDL 4.2 11/03/2020  -goal LDL < 70 -ordered lipid panel       Relevant Orders   Lipid Panel With LDL/HDL Ratio     Other   Vitamin D  deficiency    -will monitor Vit D levels -had been taking rx vit D; may be able to transition to OTC supplement based on results      Relevant Orders   VITAMIN D 25 Hydroxy (Vit-D Deficiency, Fractures)   Pre-hypertension    -BP well controlled today -ortho vitals completed d/t lightheadedness      Relevant Orders   CBC with Differential/Platelet   CMP14+EGFR   Lightheadedness    -negative for orthostatic hypotension -HR ranged from 54-64 -since she thinks her symptoms started with glipizide, will check labs; lisinopril shouldn't cause bradycardia -will consider cardiology referral if nothing obvious on labs       Other Visit Diagnoses    Mild nausea and vomiting          Meds ordered this encounter  Medications  . blood glucose meter kit and supplies    Sig: Dispense based on patient and insurance preference. Use up to four times daily as directed. (FOR ICD-10 E10.9, E11.9).    Dispense:  1 each    Refill:  0    E11.65    Order Specific Question:   Number of strips    Answer:   100    Order Specific Question:   Number of lancets    Answer:   100  . albuterol (PROAIR HFA) 108 (90 Base) MCG/ACT inhaler    Sig: INHALE 2 PUFFS INTO LUNGS EVERY 6 HOURS AS NEEDED.    Dispense:  8.5 g    Refill:  0    Follow-up: Return in about 6 months (around 10/13/2021) for Physical Exam.    Noreene Larsson, NP

## 2021-04-14 ENCOUNTER — Other Ambulatory Visit: Payer: Self-pay | Admitting: Nurse Practitioner

## 2021-04-14 DIAGNOSIS — D72829 Elevated white blood cell count, unspecified: Secondary | ICD-10-CM

## 2021-04-14 DIAGNOSIS — R001 Bradycardia, unspecified: Secondary | ICD-10-CM

## 2021-04-14 DIAGNOSIS — R42 Dizziness and giddiness: Secondary | ICD-10-CM

## 2021-04-14 MED ORDER — SULFAMETHOXAZOLE-TRIMETHOPRIM 800-160 MG PO TABS: 1.0000 | ORAL_TABLET | Freq: Two times a day (BID) | ORAL | 0 refills | Status: DC

## 2021-04-14 NOTE — Progress Notes (Signed)
You HR dropped to 54 on ortho vitals yesterday. Since you are symptomatic, I sent a referral to cardiology.  You can try increasing your hydration by drinking water.  Also, your WBC count was elevated, so I sent in bactrim, a broad spectrum antibiotic.  Let's follow-up in 1 week to see how your symptoms are resolving.

## 2021-04-14 NOTE — Progress Notes (Signed)
WBC elevated compared to previous. -Rx. Bactrim for infection of unknown origin  -Increase hydration  -A1c looks great at 5.9, and CBG was not out of normal range. Poor glycemic control is not likely the cause of her nausea.  -Referral to cardiology for symptomatic bradycardia

## 2021-04-15 ENCOUNTER — Telehealth: Payer: Self-pay | Admitting: Urology

## 2021-04-15 LAB — CMP14+EGFR
ALT: 53 IU/L — ABNORMAL HIGH (ref 0–32)
AST: 31 IU/L (ref 0–40)
Albumin/Globulin Ratio: 1.7 (ref 1.2–2.2)
Albumin: 4.4 g/dL (ref 3.8–4.9)
Alkaline Phosphatase: 96 IU/L (ref 44–121)
BUN/Creatinine Ratio: 12 (ref 9–23)
BUN: 9 mg/dL (ref 6–24)
Bilirubin Total: 0.5 mg/dL (ref 0.0–1.2)
CO2: 25 mmol/L (ref 20–29)
Calcium: 10.5 mg/dL — ABNORMAL HIGH (ref 8.7–10.2)
Chloride: 101 mmol/L (ref 96–106)
Creatinine, Ser: 0.75 mg/dL (ref 0.57–1.00)
Globulin, Total: 2.6 g/dL (ref 1.5–4.5)
Glucose: 120 mg/dL — ABNORMAL HIGH (ref 65–99)
Potassium: 5.3 mmol/L — ABNORMAL HIGH (ref 3.5–5.2)
Sodium: 138 mmol/L (ref 134–144)
Total Protein: 7 g/dL (ref 6.0–8.5)
eGFR: 93 mL/min/{1.73_m2} (ref >59)

## 2021-04-15 LAB — CBC WITH DIFFERENTIAL/PLATELET
Basophils Absolute: 0.1 10*3/uL (ref 0.0–0.2)
Basos: 1 %
EOS (ABSOLUTE): 0.2 10*3/uL (ref 0.0–0.4)
Eos: 1 %
Hematocrit: 44.2 % (ref 34.0–46.6)
Hemoglobin: 15 g/dL (ref 11.1–15.9)
Immature Grans (Abs): 0 10*3/uL (ref 0.0–0.1)
Immature Granulocytes: 0 %
Lymphocytes Absolute: 3 10*3/uL (ref 0.7–3.1)
Lymphs: 25 %
MCH: 32.8 pg (ref 26.6–33.0)
MCHC: 33.9 g/dL (ref 31.5–35.7)
MCV: 97 fL (ref 79–97)
Monocytes Absolute: 0.9 10*3/uL (ref 0.1–0.9)
Monocytes: 7 %
Neutrophils Absolute: 8 10*3/uL — ABNORMAL HIGH (ref 1.4–7.0)
Neutrophils: 66 %
Platelets: 341 10*3/uL (ref 150–450)
RBC: 4.57 x10E6/uL (ref 3.77–5.28)
RDW: 11.7 % (ref 11.7–15.4)
WBC: 12.2 10*3/uL — ABNORMAL HIGH (ref 3.4–10.8)

## 2021-04-15 LAB — MICROALBUMIN / CREATININE URINE RATIO
Creatinine, Urine: 103.9 mg/dL
Microalb/Creat Ratio: 5 mg/g creat (ref 0–29)
Microalbumin, Urine: 5.2 ug/mL

## 2021-04-15 LAB — LIPID PANEL WITH LDL/HDL RATIO
Cholesterol, Total: 127 mg/dL (ref 100–199)
HDL: 51 mg/dL (ref >39)
LDL Chol Calc (NIH): 52 mg/dL (ref 0–99)
LDL/HDL Ratio: 1 ratio (ref 0.0–3.2)
Triglycerides: 141 mg/dL (ref 0–149)
VLDL Cholesterol Cal: 24 mg/dL (ref 5–40)

## 2021-04-15 LAB — HEMOGLOBIN A1C
Est. average glucose Bld gHb Est-mCnc: 123 mg/dL
Hgb A1c MFr Bld: 5.9 % — ABNORMAL HIGH (ref 4.8–5.6)

## 2021-04-15 LAB — VITAMIN D 25 HYDROXY (VIT D DEFICIENCY, FRACTURES): Vit D, 25-Hydroxy: 55.8 ng/mL (ref 30.0–100.0)

## 2021-04-15 NOTE — Telephone Encounter (Signed)
Patient called wanting to know if you have looked at the disc that she brought in. Please call her when you can. Thanks!

## 2021-04-18 ENCOUNTER — Other Ambulatory Visit: Payer: Self-pay

## 2021-04-18 ENCOUNTER — Emergency Department (HOSPITAL_COMMUNITY)
Admission: EM | Admit: 2021-04-18 | Discharge: 2021-04-18 | Disposition: A | Discharge status: Home / Self Care | Payer: Medicaid Other

## 2021-04-20 NOTE — Telephone Encounter (Signed)
Mychart message sent.

## 2021-04-21 ENCOUNTER — Encounter: Payer: Self-pay | Admitting: Nurse Practitioner

## 2021-04-21 ENCOUNTER — Other Ambulatory Visit: Payer: Self-pay

## 2021-04-21 ENCOUNTER — Ambulatory Visit (INDEPENDENT_AMBULATORY_CARE_PROVIDER_SITE_OTHER): Payer: Medicaid Other | Admitting: Nurse Practitioner

## 2021-04-21 VITALS — BP 96/57 | HR 86 | Temp 98.9°F | Resp 20 | Ht 65.0 in | Wt 221.0 lb

## 2021-04-21 DIAGNOSIS — R030 Elevated blood-pressure reading, without diagnosis of hypertension: Secondary | ICD-10-CM

## 2021-04-21 DIAGNOSIS — M25511 Pain in right shoulder: Secondary | ICD-10-CM

## 2021-04-21 DIAGNOSIS — R42 Dizziness and giddiness: Secondary | ICD-10-CM

## 2021-04-21 MED ORDER — IBUPROFEN 800 MG PO TABS: 800.0000 mg | ORAL_TABLET | Freq: Three times a day (TID) | ORAL | 0 refills | Status: DC | PRN

## 2021-04-21 NOTE — Assessment & Plan Note (Signed)
-  feels better today -symptoms are non-specific -was concerned with symptomatic bradycardia at last OV d/t HR dropping to 54 during orthostatic VS, but HR is great today at 86 -EKG today -we gave her bactrim for leukocytosis, will recheck labs today to see if leukocytosis resolved

## 2021-04-21 NOTE — Progress Notes (Signed)
Acute Office Visit  Subjective:    Patient ID: Michaela Aguilar, female    DOB: [birth year], 55 y.o.   MRN: [id number]  Chief Complaint  Patient presents with   Diabetes    Follow up   Shoulder Injury    R shoulder pain; DOI: 04/17/21; pt threw away a trash bag and heard her shoulder pop.     Diabetes  Shoulder Injury   Patient is in today for right shoulder pain. She made a throwing motion and heard her right shoulder pop on 04/17/21.  She states that she feels like her back is better and she feels better in general today.  Yesterday, however, she states that she didn't feel well. She was treated with bactrim after we checked labs last week because she had leukocytosis.  Past Medical History:  Diagnosis Date   Anxiety    Bronchitis    Chronic pain of left knee 04/03/2017   Chronic pain of right knee 08/16/2017   Depression    Encounter for screening for malignant neoplasm of cervix 11/08/2019   Encounter for screening for malignant neoplasm of colon 11/08/2019   Encounter for screening mammogram for malignant neoplasm of breast 11/08/2019   Gestational diabetes    1992   History of kidney stones    Kidney stones per pt.   Low back pain 11/17/2015   Neck pain 11/17/2015    Past Surgical History:  Procedure Laterality Date   CESAREAN SECTION     x2   CYSTOSCOPY WITH RETROGRADE PYELOGRAM, URETEROSCOPY AND STENT PLACEMENT Right 08/18/2019   Procedure: CYSTOSCOPY WITH RETROGRADE PYELOGRAM, URETEROSCOPY AND STENT PLACEMENT;  Surgeon: Cleon Gustin, MD;  Location: [facility];  Service: Urology;  Laterality: Right;   CYSTOSCOPY WITH STENT PLACEMENT Right 09/06/2016   Procedure: CYSTOSCOPY WITH RIGHT URETERAL STENT PLACEMENT;  Surgeon: Cleon Gustin, MD;  Location: [facility];  Service: Urology;  Laterality: Right;   CYSTOSCOPY/RETROGRADE/URETEROSCOPY/STONE EXTRACTION WITH BASKET Right 09/06/2016   Procedure: CYSTOSCOPY/RIGHT RETROGRADE PYELOGRAM, RIGHT RENAL STONE EXTRACTION WITH  LASER;  Surgeon: Cleon Gustin, MD;  Location: [facility];  Service: Urology;  Laterality: Right;   EXTRACORPOREAL SHOCK WAVE LITHOTRIPSY  1995 2010   HERNIA REPAIR     incisional hernia   STONE EXTRACTION WITH BASKET Right 08/18/2019   Procedure: STONE EXTRACTION WITH BASKET;  Surgeon: Cleon Gustin, MD;  Location: [facility];  Service: Urology;  Laterality: Right;    Family History  Problem Relation Age of Onset   Osteoporosis Mother    Depression Father    Mental illness Father    Cancer Maternal Uncle        lung cancer   COPD Maternal Uncle    Depression Maternal Uncle    Emphysema Maternal Grandmother    Cancer Maternal Grandfather        lung cancer   Breast cancer Neg Hx     Social History   Socioeconomic History   Marital status: Divorced    Spouse name: Not on file   Number of children: 2   Years of education: Not on file   Highest education level: Some college, no degree  Occupational History   Not on file  Tobacco Use   Smoking status: Every Day    Packs/day: 0.25    Years: 15.00    Pack years: 3.75    Types: Cigarettes, Cigars   Smokeless tobacco: Never  Vaping Use   Vaping Use: Never used  Substance and Sexual Activity  Alcohol use: Not Currently   Drug use: No    Types: Marijuana    Comment: last marijuana use early October 2017   Sexual activity: Yes    Birth control/protection: None, Post-menopausal  Other Topics Concern   Not on file  Social History Narrative   Lives with roommate   2 children (grown)   1 daughter no grandbabies    1 son-2 grandbabies 7 month and 77 year old (daughters)   2 cats: Bella and Tip       Enjoys: swim and ride bike enjoys being outside       Diet:eats all food groups, enjoys bread-air frying more, needs more veggies    Caffeine: soda-sugar free- one daily,    Water: 3-4 cups daily       Wears seat belt   Smoke detectors    Does not use phone while driving    Social Determinants of Systems developer Strain: Low Risk    Difficulty of Paying Living Expenses: Not hard at all  Food Insecurity: No Food Insecurity   Worried About Charity fundraiser in the Last Year: Never true   Arboriculturist in the Last Year: Never true  Transportation Needs: No Transportation Needs   Lack of Transportation (Medical): No   Lack of Transportation (Non-Medical): No  Physical Activity: Sufficiently Active   Days of Exercise per Week: 7 days   Minutes of Exercise per Session: 30 min  Stress: Not on file  Social Connections: Socially Isolated   Frequency of Communication with Friends and Family: More than three times a week   Frequency of Social Gatherings with Friends and Family: More than three times a week   Attends Religious Services: Never   Marine scientist or Organizations: No   Attends Archivist Meetings: Never   Marital Status: Divorced  Human resources officer Violence: Not on file    Outpatient Medications Prior to Visit  Medication Sig Dispense Refill   albuterol (PROAIR HFA) 108 (90 Base) MCG/ACT inhaler INHALE 2 PUFFS INTO LUNGS EVERY 6 HOURS AS NEEDED. 8.5 g 0   atorvastatin (LIPITOR) 40 MG tablet TAKE ONE TABLET BY MOUTH ONCE DAILY. 90 tablet 0   blood glucose meter kit and supplies Dispense based on patient and insurance preference. Use up to four times daily as directed. (FOR ICD-10 E10.9, E11.9). 1 each 0   cyclobenzaprine (FLEXERIL) 5 MG tablet Take 1 tablet (5 mg total) by mouth 3 (three) times daily as needed for muscle spasms. 30 tablet 0   famotidine (PEPCID) 20 MG tablet Take 1 tablet (20 mg total) by mouth 2 (two) times daily. 30 tablet 1   glipiZIDE (GLUCOTROL) 5 MG tablet TAKE (1) TABLET BY MOUTH TWICE DAILY BEFORE MEALS. 180 tablet 0   lisinopril (ZESTRIL) 10 MG tablet TAKE ONE TABLET BY MOUTH ONCE DAILY. 90 tablet 0   Multiple Vitamins-Minerals (MULTIVITAMIN WITH MINERALS) tablet Take 1 tablet by mouth daily.     Omega-3 Fatty Acids (FISH OIL) 1000 MG  CAPS Take 1 capsule by mouth in the morning and at bedtime.     sulfamethoxazole-trimethoprim (BACTRIM DS) 800-160 MG tablet Take 1 tablet by mouth 2 (two) times daily. 14 tablet 0   Vitamin D, Ergocalciferol, (DRISDOL) 1.25 MG (50000 UNIT) CAPS capsule Take 1 capsule (50,000 Units total) by mouth every 7 (seven) days. 12 capsule 1   No facility-administered medications prior to visit.    No Known Allergies  Review of Systems  Constitutional: Negative.   Respiratory: Negative.    Cardiovascular: Negative.   Musculoskeletal:        Right shoulder pain, limited ROM  Psychiatric/Behavioral: Negative.        Objective:    Physical Exam Constitutional:      Appearance: Normal appearance. She is obese.  Cardiovascular:     Rate and Rhythm: Normal rate and regular rhythm.     Pulses: Normal pulses.     Heart sounds: Normal heart sounds.  Pulmonary:     Effort: Pulmonary effort is normal.     Breath sounds: Normal breath sounds.  Musculoskeletal:        General: Tenderness and signs of injury present.     Comments: Limited ROM to right shoulder  Neurological:     Mental Status: She is alert.    LMP 02/16/2014  Wt Readings from Last 3 Encounters:  04/13/21 224 lb (101.6 kg)  02/04/21 231 lb (104.8 kg)  11/03/20 239 lb (108.4 kg)    Health Maintenance Due  Topic Date Due   PNEUMOCOCCAL POLYSACCHARIDE VACCINE AGE 27-64 HIGH RISK  Never done   Pneumococcal Vaccine 89-54 Years old (1 - PCV) Never done   COLONOSCOPY (Pts 45-35yrs Insurance coverage will need to be confirmed)  Never done   Zoster Vaccines- Shingrix (1 of 2) Never done   FOOT EXAM  03/28/2019   OPHTHALMOLOGY EXAM  04/10/2019   PAP SMEAR-Modifier  10/27/2019   COVID-19 Vaccine (4 - Booster for Moderna series) 02/13/2021    There are no preventive care reminders to display for this patient.   Lab Results  Component Value Date   TSH 1.240 11/03/2020   Lab Results  Component Value Date   WBC 12.2 (H)  04/13/2021   HGB 15.0 04/13/2021   HCT 44.2 04/13/2021   MCV 97 04/13/2021   PLT 341 04/13/2021   Lab Results  Component Value Date   NA 138 04/13/2021   K 5.3 (H) 04/13/2021   CO2 25 04/13/2021   GLUCOSE 120 (H) 04/13/2021   BUN 9 04/13/2021   CREATININE 0.75 04/13/2021   BILITOT 0.5 04/13/2021   ALKPHOS 96 04/13/2021   AST 31 04/13/2021   ALT 53 (H) 04/13/2021   PROT 7.0 04/13/2021   ALBUMIN 4.4 04/13/2021   CALCIUM 10.5 (H) 04/13/2021   ANIONGAP 8 07/16/2019   EGFR 93 04/13/2021   Lab Results  Component Value Date   CHOL 127 04/13/2021   Lab Results  Component Value Date   HDL 51 04/13/2021   Lab Results  Component Value Date   LDLCALC 52 04/13/2021   Lab Results  Component Value Date   TRIG 141 04/13/2021   Lab Results  Component Value Date   CHOLHDL 4.2 11/03/2020   Lab Results  Component Value Date   HGBA1C 5.9 (H) 04/13/2021       Assessment & Plan:   Problem List Items Addressed This Visit   None    No orders of the defined types were placed in this encounter.    Noreene Larsson, NP

## 2021-04-21 NOTE — Patient Instructions (Signed)
For your shoulder, I sent in a referral to orthopedics and ibuprofen. For comfort, you should get a shoulder sling if orthopedics can't see you today.  For your malaise, we are getting labs drawn today. Please follow-up with cardiology and urology.

## 2021-04-21 NOTE — Assessment & Plan Note (Signed)
-  heard a pop while throwing out a trash bag on 04/17/21 -has limited ROM -recommended sling until she gets in with ortho -referral to ortho -Rx. ibuprofen

## 2021-04-21 NOTE — Assessment & Plan Note (Addendum)
BP Readings from Last 3 Encounters:  04/21/21 (!) 96/57  04/13/21 107/70  02/04/21 (!) 97/56   -BP soft today; HR improved to 86 today; last week it was in the 50-60s depending on position -checking EKG today- showed NSR with rate 69

## 2021-04-22 ENCOUNTER — Other Ambulatory Visit: Payer: Self-pay | Admitting: Nurse Practitioner

## 2021-04-22 LAB — CMP14+EGFR
ALT: 48 IU/L — ABNORMAL HIGH (ref 0–32)
AST: 28 IU/L (ref 0–40)
Albumin/Globulin Ratio: 1.6 (ref 1.2–2.2)
Albumin: 4.6 g/dL (ref 3.8–4.9)
Alkaline Phosphatase: 94 IU/L (ref 44–121)
BUN/Creatinine Ratio: 18 (ref 9–23)
BUN: 15 mg/dL (ref 6–24)
Bilirubin Total: 0.5 mg/dL (ref 0.0–1.2)
CO2: 23 mmol/L (ref 20–29)
Calcium: 11.1 mg/dL — ABNORMAL HIGH (ref 8.7–10.2)
Chloride: 99 mmol/L (ref 96–106)
Creatinine, Ser: 0.85 mg/dL (ref 0.57–1.00)
Globulin, Total: 2.9 g/dL (ref 1.5–4.5)
Glucose: 100 mg/dL — ABNORMAL HIGH (ref 65–99)
Potassium: 5.3 mmol/L — ABNORMAL HIGH (ref 3.5–5.2)
Sodium: 136 mmol/L (ref 134–144)
Total Protein: 7.5 g/dL (ref 6.0–8.5)
eGFR: 80 mL/min/{1.73_m2} (ref >59)

## 2021-04-22 LAB — CBC WITH DIFFERENTIAL/PLATELET
Basophils Absolute: 0.1 10*3/uL (ref 0.0–0.2)
Basos: 1 %
EOS (ABSOLUTE): 0.1 10*3/uL (ref 0.0–0.4)
Eos: 1 %
Hematocrit: 45.4 % (ref 34.0–46.6)
Hemoglobin: 15.1 g/dL (ref 11.1–15.9)
Immature Grans (Abs): 0 10*3/uL (ref 0.0–0.1)
Immature Granulocytes: 0 %
Lymphocytes Absolute: 2.7 10*3/uL (ref 0.7–3.1)
Lymphs: 21 %
MCH: 32.6 pg (ref 26.6–33.0)
MCHC: 33.3 g/dL (ref 31.5–35.7)
MCV: 98 fL — ABNORMAL HIGH (ref 79–97)
Monocytes Absolute: 1 10*3/uL — ABNORMAL HIGH (ref 0.1–0.9)
Monocytes: 8 %
Neutrophils Absolute: 8.7 10*3/uL — ABNORMAL HIGH (ref 1.4–7.0)
Neutrophils: 69 %
Platelets: 312 10*3/uL (ref 150–450)
RBC: 4.63 x10E6/uL (ref 3.77–5.28)
RDW: 11.7 % (ref 11.7–15.4)
WBC: 12.6 10*3/uL — ABNORMAL HIGH (ref 3.4–10.8)

## 2021-04-22 NOTE — Progress Notes (Signed)
Her calcium is a little elevated. Can we get her to come in next week for a lab draw? I put in lab orders. No need to fast.

## 2021-04-27 ENCOUNTER — Encounter: Payer: Self-pay | Admitting: Orthopedic Surgery

## 2021-04-27 ENCOUNTER — Other Ambulatory Visit: Payer: Self-pay

## 2021-04-27 ENCOUNTER — Ambulatory Visit: Payer: Medicaid Other

## 2021-04-27 ENCOUNTER — Ambulatory Visit (INDEPENDENT_AMBULATORY_CARE_PROVIDER_SITE_OTHER): Payer: Medicaid Other | Admitting: Orthopedic Surgery

## 2021-04-27 VITALS — BP 136/73 | HR 70 | Ht 65.0 in | Wt 227.0 lb

## 2021-04-27 DIAGNOSIS — M25511 Pain in right shoulder: Secondary | ICD-10-CM

## 2021-04-27 DIAGNOSIS — M7581 Other shoulder lesions, right shoulder: Secondary | ICD-10-CM

## 2021-04-27 NOTE — Progress Notes (Signed)
New Patient Visit  Assessment: Michaela Aguilar is a 55 y.o. female with the following: Right rotator cuff tendinitis  Plan: We had extensive discussion in clinic today in regards to her right shoulder.  Based on the description of her injury, and subsequent limitations, I am concerned about an injury to her rotator cuff tendons.  However, she states that her pain and function is quickly improving.  On physical exam today, her range of motion is pretty good, with some obvious discomfort.  Her strength is also recovering.  It is possible that she has torn or injured the rotator cuff tendons, but based on her physical exam improvement in her symptoms, I am less concerned.  We discussed potential treatment options including medications, and injection, therapy and the potential for an MRI should her symptoms not resolve.  She stated her understanding.  At this time, she wishes to give the shoulder more time, she will continue to take medications as needed.  All questions were answered she is amenable this plan.  Follow-up as needed.   Follow-up: Return if symptoms worsen or fail to improve.  Subjective:  Chief Complaint  Patient presents with   Shoulder Injury    Rt shoulder pain after throwing a bag, states she feels like the shoulder popped out of place and went back in.    History of Present Illness: Michaela Aguilar is a 55 y.o. female who has been referred to clinic today by Bjorn Pippin, NP for evaluation of right shoulder pain.  She right-hand-dominant.  Approximately 10 days ago, she was taking a bag of trash out.  She threw the trash bag, and felt and heard a popping sensation in the shoulder.  This is followed by immediate pain.  She has limited the ability to move the shoulder at that point.  She did present to the emergency department, but was very busy, so she left.  She was subsequently evaluated by her primary care provider, and referred to clinic.  Since the injury, she has noted gradual  improvement in her symptoms.  Her pain has gradually improved.  She does note that it gets worse at night.  She has tried sleeping in a recliner, but this worsens her symptoms.  Otherwise, she is taking over-the-counter pain medications as needed.  No previous injections in the shoulder.  No previous injuries.  She has not worked with physical therapy.   Review of Systems: No fevers or chills No numbness or tingling No chest pain No shortness of breath No bowel or bladder dysfunction No GI distress No headaches   Medical History:  Past Medical History:  Diagnosis Date   Anxiety    Bronchitis    Chronic pain of left knee 04/03/2017   Chronic pain of right knee 08/16/2017   Depression    Encounter for screening for malignant neoplasm of cervix 11/08/2019   Encounter for screening for malignant neoplasm of colon 11/08/2019   Encounter for screening mammogram for malignant neoplasm of breast 11/08/2019   Gestational diabetes    1992   History of kidney stones    Kidney stones per pt.   Low back pain 11/17/2015   Neck pain 11/17/2015    Past Surgical History:  Procedure Laterality Date   CESAREAN SECTION     x2   CYSTOSCOPY WITH RETROGRADE PYELOGRAM, URETEROSCOPY AND STENT PLACEMENT Right 08/18/2019   Procedure: CYSTOSCOPY WITH RETROGRADE PYELOGRAM, URETEROSCOPY AND STENT PLACEMENT;  Surgeon: Malen Gauze, MD;  Location: [facility];  Service: Urology;  Laterality: Right;   CYSTOSCOPY WITH STENT PLACEMENT Right 09/06/2016   Procedure: CYSTOSCOPY WITH RIGHT URETERAL STENT PLACEMENT;  Surgeon: Cleon Gustin, MD;  Location: [facility];  Service: Urology;  Laterality: Right;   CYSTOSCOPY/RETROGRADE/URETEROSCOPY/STONE EXTRACTION WITH BASKET Right 09/06/2016   Procedure: CYSTOSCOPY/RIGHT RETROGRADE PYELOGRAM, RIGHT RENAL STONE EXTRACTION WITH LASER;  Surgeon: Cleon Gustin, MD;  Location: [facility];  Service: Urology;  Laterality: Right;   EXTRACORPOREAL SHOCK WAVE LITHOTRIPSY  1995  2010   HERNIA REPAIR     incisional hernia   STONE EXTRACTION WITH BASKET Right 08/18/2019   Procedure: STONE EXTRACTION WITH BASKET;  Surgeon: Cleon Gustin, MD;  Location: [facility];  Service: Urology;  Laterality: Right;    Family History  Problem Relation Age of Onset   Osteoporosis Mother    Depression Father    Mental illness Father    Cancer Maternal Uncle        lung cancer   COPD Maternal Uncle    Depression Maternal Uncle    Emphysema Maternal Grandmother    Cancer Maternal Grandfather        lung cancer   Breast cancer Neg Hx    Social History   Tobacco Use   Smoking status: Every Day    Packs/day: 0.25    Years: 15.00    Pack years: 3.75    Types: Cigarettes, Cigars   Smokeless tobacco: Never  Vaping Use   Vaping Use: Never used  Substance Use Topics   Alcohol use: Not Currently   Drug use: No    Types: Marijuana    Comment: last marijuana use early October 2017    No Known Allergies  Current Meds  Medication Sig   albuterol (PROAIR HFA) 108 (90 Base) MCG/ACT inhaler INHALE 2 PUFFS INTO LUNGS EVERY 6 HOURS AS NEEDED.   atorvastatin (LIPITOR) 40 MG tablet TAKE ONE TABLET BY MOUTH ONCE DAILY.   blood glucose meter kit and supplies Dispense based on patient and insurance preference. Use up to four times daily as directed. (FOR ICD-10 E10.9, E11.9).   cyclobenzaprine (FLEXERIL) 5 MG tablet Take 1 tablet (5 mg total) by mouth 3 (three) times daily as needed for muscle spasms.   famotidine (PEPCID) 20 MG tablet Take 1 tablet (20 mg total) by mouth 2 (two) times daily.   glipiZIDE (GLUCOTROL) 5 MG tablet TAKE (1) TABLET BY MOUTH TWICE DAILY BEFORE MEALS.   ibuprofen (ADVIL) 800 MG tablet Take 1 tablet (800 mg total) by mouth every 8 (eight) hours as needed.   lisinopril (ZESTRIL) 10 MG tablet TAKE ONE TABLET BY MOUTH ONCE DAILY.   Multiple Vitamins-Minerals (MULTIVITAMIN WITH MINERALS) tablet Take 1 tablet by mouth daily.   Omega-3 Fatty Acids (FISH OIL)  1000 MG CAPS Take 1 capsule by mouth in the morning and at bedtime.   sulfamethoxazole-trimethoprim (BACTRIM DS) 800-160 MG tablet Take 1 tablet by mouth 2 (two) times daily.   Vitamin D, Ergocalciferol, (DRISDOL) 1.25 MG (50000 UNIT) CAPS capsule Take 1 capsule (50,000 Units total) by mouth every 7 (seven) days.    Objective: BP 136/73   Pulse 70   Ht _0  (1.651 m)   Wt 227 lb (103 kg)   LMP 02/16/2014   BMI 37.77 kg/m   Physical Exam:  General: Alert and oriented.  No acute distress.  Obese female. Gait: Normal  Evaluation of the right shoulder demonstrates no deformity.  No atrophy is appreciated.  Active forward flexion to approximately  150 degrees with some pain.  Internal rotation to the lumbar spine.  External rotation at her side to 40 degrees.  Minimal tenderness over the bicipital groove.  Some tenderness to palpation over the lateral aspect of the shoulder.  Infraspinatus and supraspinatus testing is 4+/5.  Negative belly press.    IMAGING: I personally ordered and reviewed the following images  X-rays of the right shoulder obtained in clinic today and demonstrates no acute injuries.  Minimal degenerative changes, including a very small osteophyte in the inferior aspect of the humeral head.  No proximal humeral migration.  Glenohumeral joint spaces maintained.  Impression: Normal right shoulder x-ray   New Medications:  No orders of the defined types were placed in this encounter.     Mordecai Rasmussen, MD  04/27/2021 9:25 AM

## 2021-04-27 NOTE — Progress Notes (Deleted)
New Patient Visit  Assessment: Michaela Aguilar is a 55 y.o. female with the following: 1. Acute pain of right shoulder    Plan:  Follow-up: No follow-ups on file.  Subjective:  Chief Complaint  Patient presents with   Shoulder Injury    Rt shoulder pain after throwing a bag, states she feels like the shoulder popped out of place and went back in.    History of Present Illness: Michaela Aguilar is a 55 y.o. female who presents    Review of Systems: No fevers or chills*** No numbness or tingling No chest pain No shortness of breath No bowel or bladder dysfunction No GI distress No headaches   Medical History:  Past Medical History:  Diagnosis Date   Anxiety    Bronchitis    Chronic pain of left knee 04/03/2017   Chronic pain of right knee 08/16/2017   Depression    Encounter for screening for malignant neoplasm of cervix 11/08/2019   Encounter for screening for malignant neoplasm of colon 11/08/2019   Encounter for screening mammogram for malignant neoplasm of breast 11/08/2019   Gestational diabetes    1992   History of kidney stones    Kidney stones per pt.   Low back pain 11/17/2015   Neck pain 11/17/2015    Past Surgical History:  Procedure Laterality Date   CESAREAN SECTION     x2   CYSTOSCOPY WITH RETROGRADE PYELOGRAM, URETEROSCOPY AND STENT PLACEMENT Right 08/18/2019   Procedure: CYSTOSCOPY WITH RETROGRADE PYELOGRAM, URETEROSCOPY AND STENT PLACEMENT;  Surgeon: Cleon Gustin, MD;  Location: [facility];  Service: Urology;  Laterality: Right;   CYSTOSCOPY WITH STENT PLACEMENT Right 09/06/2016   Procedure: CYSTOSCOPY WITH RIGHT URETERAL STENT PLACEMENT;  Surgeon: Cleon Gustin, MD;  Location: [facility];  Service: Urology;  Laterality: Right;   CYSTOSCOPY/RETROGRADE/URETEROSCOPY/STONE EXTRACTION WITH BASKET Right 09/06/2016   Procedure: CYSTOSCOPY/RIGHT RETROGRADE PYELOGRAM, RIGHT RENAL STONE EXTRACTION WITH LASER;  Surgeon: Cleon Gustin, MD;  Location: [facility];  Service: Urology;  Laterality: Right;   EXTRACORPOREAL SHOCK WAVE LITHOTRIPSY  1995 2010   HERNIA REPAIR     incisional hernia   STONE EXTRACTION WITH BASKET Right 08/18/2019   Procedure: STONE EXTRACTION WITH BASKET;  Surgeon: Cleon Gustin, MD;  Location: [facility];  Service: Urology;  Laterality: Right;    Family History  Problem Relation Age of Onset   Osteoporosis Mother    Depression Father    Mental illness Father    Cancer Maternal Uncle        lung cancer   COPD Maternal Uncle    Depression Maternal Uncle    Emphysema Maternal Grandmother    Cancer Maternal Grandfather        lung cancer   Breast cancer Neg Hx    Social History   Tobacco Use   Smoking status: Every Day    Packs/day: 0.25    Years: 15.00    Pack years: 3.75    Types: Cigarettes, Cigars   Smokeless tobacco: Never  Vaping Use   Vaping Use: Never used  Substance Use Topics   Alcohol use: Not Currently   Drug use: No    Types: Marijuana    Comment: last marijuana use early October 2017    No Known Allergies  Current Meds  Medication Sig   albuterol (PROAIR HFA) 108 (90 Base) MCG/ACT inhaler INHALE 2 PUFFS INTO LUNGS EVERY 6 HOURS AS NEEDED.   atorvastatin (LIPITOR) 40 MG tablet TAKE  ONE TABLET BY MOUTH ONCE DAILY.   blood glucose meter kit and supplies Dispense based on patient and insurance preference. Use up to four times daily as directed. (FOR ICD-10 E10.9, E11.9).   cyclobenzaprine (FLEXERIL) 5 MG tablet Take 1 tablet (5 mg total) by mouth 3 (three) times daily as needed for muscle spasms.   famotidine (PEPCID) 20 MG tablet Take 1 tablet (20 mg total) by mouth 2 (two) times daily.   glipiZIDE (GLUCOTROL) 5 MG tablet TAKE (1) TABLET BY MOUTH TWICE DAILY BEFORE MEALS.   ibuprofen (ADVIL) 800 MG tablet Take 1 tablet (800 mg total) by mouth every 8 (eight) hours as needed.   lisinopril (ZESTRIL) 10 MG tablet TAKE ONE TABLET BY MOUTH ONCE DAILY.   Multiple Vitamins-Minerals  (MULTIVITAMIN WITH MINERALS) tablet Take 1 tablet by mouth daily.   Omega-3 Fatty Acids (FISH OIL) 1000 MG CAPS Take 1 capsule by mouth in the morning and at bedtime.   sulfamethoxazole-trimethoprim (BACTRIM DS) 800-160 MG tablet Take 1 tablet by mouth 2 (two) times daily.   Vitamin D, Ergocalciferol, (DRISDOL) 1.25 MG (50000 UNIT) CAPS capsule Take 1 capsule (50,000 Units total) by mouth every 7 (seven) days.    Objective: BP 136/73   Pulse 70   Ht $R'5\' 5"'kk$  (1.651 m)   Wt 227 lb (103 kg)   LMP 02/16/2014   BMI 37.77 kg/m   Physical Exam:  General:  Gait:     IMAGING: {XR Reviewed:24899}   New Medications:  No orders of the defined types were placed in this encounter.     Mordecai Rasmussen, MD  04/27/2021 9:17 AM

## 2021-05-05 NOTE — Progress Notes (Signed)
Calcium improved and is in the normal range.

## 2021-05-06 LAB — PTH, INTACT AND CALCIUM
Calcium: 10.1 mg/dL (ref 8.7–10.2)
PTH: 33 pg/mL (ref 15–65)

## 2021-05-06 LAB — CALCIUM, IONIZED: Calcium, Ion: 5.5 mg/dL (ref 4.5–5.6)

## 2021-05-06 LAB — VITAMIN D 25 HYDROXY (VIT D DEFICIENCY, FRACTURES): Vit D, 25-Hydroxy: 36 ng/mL (ref 30.0–100.0)

## 2021-05-10 ENCOUNTER — Other Ambulatory Visit: Payer: Self-pay | Admitting: Family Medicine

## 2021-05-10 ENCOUNTER — Other Ambulatory Visit: Payer: Self-pay | Admitting: Nurse Practitioner

## 2021-05-10 DIAGNOSIS — E119 Type 2 diabetes mellitus without complications: Secondary | ICD-10-CM

## 2021-05-10 DIAGNOSIS — J449 Chronic obstructive pulmonary disease, unspecified: Secondary | ICD-10-CM

## 2021-05-10 DIAGNOSIS — E559 Vitamin D deficiency, unspecified: Secondary | ICD-10-CM

## 2021-05-10 DIAGNOSIS — E1169 Type 2 diabetes mellitus with other specified complication: Secondary | ICD-10-CM

## 2021-05-20 ENCOUNTER — Ambulatory Visit: Payer: Medicaid Other | Admitting: Nurse Practitioner

## 2021-05-25 ENCOUNTER — Ambulatory Visit (INDEPENDENT_AMBULATORY_CARE_PROVIDER_SITE_OTHER): Payer: Medicaid Other | Admitting: Orthopedic Surgery

## 2021-05-25 ENCOUNTER — Encounter: Payer: Self-pay | Admitting: Orthopedic Surgery

## 2021-05-25 ENCOUNTER — Ambulatory Visit: Payer: Medicaid Other | Admitting: Nurse Practitioner

## 2021-05-25 ENCOUNTER — Other Ambulatory Visit: Payer: Self-pay

## 2021-05-25 VITALS — BP 141/72 | HR 74 | Ht 65.0 in | Wt 221.0 lb

## 2021-05-25 DIAGNOSIS — M7581 Other shoulder lesions, right shoulder: Secondary | ICD-10-CM

## 2021-05-25 MED ORDER — CELECOXIB 200 MG PO CAPS: 200.0000 mg | ORAL_CAPSULE | Freq: Two times a day (BID) | ORAL | 0 refills | Status: DC

## 2021-05-25 MED ORDER — CYCLOBENZAPRINE HCL 10 MG PO TABS: 10.0000 mg | ORAL_TABLET | Freq: Two times a day (BID) | ORAL | 1 refills | Status: DC | PRN

## 2021-05-25 MED ORDER — DICLOFENAC SODIUM 50 MG PO TBEC: 50.0000 mg | DELAYED_RELEASE_TABLET | Freq: Two times a day (BID) | ORAL | 0 refills | Status: DC

## 2021-05-25 NOTE — Addendum Note (Signed)
Addended by: Larena Glassman A on: 05/25/2021 01:47 PM   Modules accepted: Orders

## 2021-05-25 NOTE — Patient Instructions (Signed)

## 2021-05-25 NOTE — Progress Notes (Addendum)
Orthopaedic Clinic Return  Assessment: Michaela Aguilar is a 55 y.o. female with the following: Right shoulder rotator cuff tendinitis  Plan: Patient was recently seen in clinic, and elected to proceed with over-the-counter medications.  However, she continued to have considerable pain in the right shoulder.  Pain gets worse at night.  After discussing all the options, I have provided her with a prescription for Celebrex, as well as Flexeril to be taken primarily at nighttime.  She is also interested in a steroid injection.  As the shoulder starts to feel better, she can initiate shoulder exercises.  Follow up as needed.  Procedure note injection - Right shoulder    Verbal consent was obtained to inject the right shoulder, subacromial space Timeout was completed to confirm the site of injection.   The skin was prepped with alcohol and ethyl chloride was sprayed at the injection site.  A 21-gauge needle was used to inject 40 mg of Depo-Medrol and 1% lidocaine (3 cc) into the subacromial space of the right shoulder using a posterolateral approach.  There were no complications.  A sterile bandage was applied.    Body mass index is 36.78 kg/m.  Follow-up: Return if symptoms worsen or fail to improve.   Subjective:  Chief Complaint  Patient presents with   Shoulder Pain    Rt shoulder pain still bothering her, worse at night.     History of Present Illness: Michaela Aguilar is a 55 y.o. female who returns to clinic for repeat evaluation of right shoulder pain.  She was last seen in clinic about a month ago, at which time she decided continue with over-the-counter medications.  Since then, she is continue to have pain in the right shoulder.  The pain gets worse at night.  It is interrupting her sleep.  She is starting to have some pain in the trapezius, with radiating pains into her neck.  Pain started bothering her after throwing a trash bag, and feeling a popping sensation in her  shoulder.  Review of Systems: No fevers or chills No numbness or tingling No chest pain No shortness of breath No bowel or bladder dysfunction No GI distress No headaches   Objective: BP (!) 141/72   Pulse 74   Ht 5\' 5"  (1.651 m)   Wt 221 lb (100.2 kg)   LMP 02/16/2014   BMI 36.78 kg/m   Physical Exam:  Alert and oriented.  No acute distress.  Right shoulder without deformity.  Very hesitant with forward flexion, 240 degrees with obvious discomfort.  Abduction at her side to 90 degrees.  Internal rotation to the lumbar spine external rotation or side to 40 degrees.  She does have some tenderness to palpation of the lateral aspect of her shoulder.  Infraspinatus and supraspinatus testing is limited due to pain, but does demonstrate some mild weakness.  Negative belly press.  IMAGING: I personally ordered and reviewed the following images:  No new imaging obtained today.  Mordecai Rasmussen, MD 05/25/2021 10:34 AM

## 2021-05-27 ENCOUNTER — Encounter: Payer: Self-pay | Admitting: Nurse Practitioner

## 2021-05-27 ENCOUNTER — Ambulatory Visit (INDEPENDENT_AMBULATORY_CARE_PROVIDER_SITE_OTHER): Payer: Medicaid Other | Admitting: Nurse Practitioner

## 2021-05-27 ENCOUNTER — Other Ambulatory Visit: Payer: Self-pay

## 2021-05-27 VITALS — BP 120/79 | HR 66 | Temp 97.0°F | Ht 65.0 in | Wt 224.0 lb

## 2021-05-27 DIAGNOSIS — E119 Type 2 diabetes mellitus without complications: Secondary | ICD-10-CM

## 2021-05-27 DIAGNOSIS — E1169 Type 2 diabetes mellitus with other specified complication: Secondary | ICD-10-CM | POA: Diagnosis not present

## 2021-05-27 DIAGNOSIS — E559 Vitamin D deficiency, unspecified: Secondary | ICD-10-CM

## 2021-05-27 DIAGNOSIS — E785 Hyperlipidemia, unspecified: Secondary | ICD-10-CM

## 2021-05-27 NOTE — Progress Notes (Signed)
Established Patient Office Visit  Subjective:  Patient ID: Michaela Aguilar, female    DOB: 07/31/1966  Age: 55 y.o. MRN: [id number]  CC:  Chief Complaint  Patient presents with   Hypercalcemia    Calcium was elevated on previous labs.    HPI Michaela Aguilar presents for follow-up for hypercalcemia. Her calcium was elevated with her past 2 lab draws.  Past Medical History:  Diagnosis Date   Anxiety    Bronchitis    Chronic pain of left knee 04/03/2017   Chronic pain of right knee 08/16/2017   Depression    Encounter for screening for malignant neoplasm of cervix 11/08/2019   Encounter for screening for malignant neoplasm of colon 11/08/2019   Encounter for screening mammogram for malignant neoplasm of breast 11/08/2019   Gestational diabetes    1992   History of kidney stones    Kidney stones per pt.   Low back pain 11/17/2015   Neck pain 11/17/2015    Past Surgical History:  Procedure Laterality Date   CESAREAN SECTION     x2   CYSTOSCOPY WITH RETROGRADE PYELOGRAM, URETEROSCOPY AND STENT PLACEMENT Right 08/18/2019   Procedure: CYSTOSCOPY WITH RETROGRADE PYELOGRAM, URETEROSCOPY AND STENT PLACEMENT;  Surgeon: Cleon Gustin, MD;  Location: [facility];  Service: Urology;  Laterality: Right;   CYSTOSCOPY WITH STENT PLACEMENT Right 09/06/2016   Procedure: CYSTOSCOPY WITH RIGHT URETERAL STENT PLACEMENT;  Surgeon: Cleon Gustin, MD;  Location: [facility];  Service: Urology;  Laterality: Right;   CYSTOSCOPY/RETROGRADE/URETEROSCOPY/STONE EXTRACTION WITH BASKET Right 09/06/2016   Procedure: CYSTOSCOPY/RIGHT RETROGRADE PYELOGRAM, RIGHT RENAL STONE EXTRACTION WITH LASER;  Surgeon: Cleon Gustin, MD;  Location: [facility];  Service: Urology;  Laterality: Right;   EXTRACORPOREAL SHOCK WAVE LITHOTRIPSY  1995 2010   HERNIA REPAIR     incisional hernia   STONE EXTRACTION WITH BASKET Right 08/18/2019   Procedure: STONE EXTRACTION WITH BASKET;  Surgeon: Cleon Gustin, MD;  Location: [facility];   Service: Urology;  Laterality: Right;    Family History  Problem Relation Age of Onset   Osteoporosis Mother    Depression Father    Mental illness Father    Cancer Maternal Uncle        lung cancer   COPD Maternal Uncle    Depression Maternal Uncle    Emphysema Maternal Grandmother    Cancer Maternal Grandfather        lung cancer   Breast cancer Neg Hx     Social History   Socioeconomic History   Marital status: Divorced    Spouse name: Not on file   Number of children: 2   Years of education: Not on file   Highest education level: Some college, no degree  Occupational History   Not on file  Tobacco Use   Smoking status: Every Day    Packs/day: 0.25    Years: 15.00    Pack years: 3.75    Types: Cigarettes, Cigars   Smokeless tobacco: Never  Vaping Use   Vaping Use: Never used  Substance and Sexual Activity   Alcohol use: Not Currently   Drug use: No    Types: Marijuana    Comment: last marijuana use early October 2017   Sexual activity: Yes    Birth control/protection: None, Post-menopausal  Other Topics Concern   Not on file  Social History Narrative   Lives with roommate   2 children (grown)   1 daughter no grandbabies  1 son-2 grandbabies 1 month and 21 year old (daughters)   2 cats: Bella and Tip       Enjoys: swim and ride bike enjoys being outside       Diet:eats all food groups, enjoys bread-air frying more, needs more veggies    Caffeine: soda-sugar free- one daily,    Water: 3-4 cups daily       Wears seat belt   Smoke detectors    Does not use phone while driving    Social Determinants of Radio broadcast assistant Strain: Low Risk    Difficulty of Paying Living Expenses: Not hard at all  Food Insecurity: No Food Insecurity   Worried About Charity fundraiser in the Last Year: Never true   Arboriculturist in the Last Year: Never true  Transportation Needs: No Transportation Needs   Lack of Transportation (Medical): No   Lack of  Transportation (Non-Medical): No  Physical Activity: Sufficiently Active   Days of Exercise per Week: 7 days   Minutes of Exercise per Session: 30 min  Stress: Not on file  Social Connections: Socially Isolated   Frequency of Communication with Friends and Family: More than three times a week   Frequency of Social Gatherings with Friends and Family: More than three times a week   Attends Religious Services: Never   Marine scientist or Organizations: No   Attends Archivist Meetings: Never   Marital Status: Divorced  Human resources officer Violence: Not on file    Outpatient Medications Prior to Visit  Medication Sig Dispense Refill   albuterol (PROAIR HFA) 108 (90 Base) MCG/ACT inhaler INHALE 2 PUFFS INTO LUNGS EVERY 6 HOURS AS NEEDED. 8.5 g 0   atorvastatin (LIPITOR) 40 MG tablet TAKE ONE TABLET BY MOUTH ONCE DAILY. 90 tablet 0   blood glucose meter kit and supplies Dispense based on patient and insurance preference. Use up to four times daily as directed. (FOR ICD-10 E10.9, E11.9). 1 each 0   celecoxib (CELEBREX) 200 MG capsule Take 1 capsule (200 mg total) by mouth 2 (two) times daily. 60 capsule 0   cyclobenzaprine (FLEXERIL) 10 MG tablet Take 1 tablet (10 mg total) by mouth 2 (two) times daily as needed for muscle spasms. 20 tablet 1   diclofenac (VOLTAREN) 50 MG EC tablet Take 50 mg by mouth 2 (two) times daily.     famotidine (PEPCID) 20 MG tablet Take 1 tablet (20 mg total) by mouth 2 (two) times daily. 30 tablet 1   glipiZIDE (GLUCOTROL) 5 MG tablet TAKE (1) TABLET BY MOUTH TWICE DAILY BEFORE MEALS. 180 tablet 0   ibuprofen (ADVIL) 800 MG tablet Take 1 tablet (800 mg total) by mouth every 8 (eight) hours as needed. 30 tablet 0   lisinopril (ZESTRIL) 10 MG tablet TAKE ONE TABLET BY MOUTH ONCE DAILY. 90 tablet 0   Multiple Vitamins-Minerals (MULTIVITAMIN WITH MINERALS) tablet Take 1 tablet by mouth daily.     Omega-3 Fatty Acids (FISH OIL) 1000 MG CAPS Take 1 capsule by  mouth in the morning and at bedtime.     Vitamin D, Ergocalciferol, (DRISDOL) 1.25 MG (50000 UNIT) CAPS capsule TAKE 1TABLET WEEKLY. (SAME DAY EACH WEEK) 12 capsule 0   sulfamethoxazole-trimethoprim (BACTRIM DS) 800-160 MG tablet Take 1 tablet by mouth 2 (two) times daily. (Patient not taking: Reported on 05/27/2021) 14 tablet 0   No facility-administered medications prior to visit.    No Known Allergies  ROS  Review of Systems  Constitutional: Negative.   Respiratory: Negative.    Cardiovascular: Negative.   Musculoskeletal:        Right shoulder pain improved after joint injection with ortho     Objective:    Physical Exam Constitutional:      Appearance: Normal appearance. She is obese.  Cardiovascular:     Rate and Rhythm: Normal rate and regular rhythm.     Pulses: Normal pulses.     Heart sounds: Normal heart sounds.  Pulmonary:     Effort: Pulmonary effort is normal.     Breath sounds: Normal breath sounds.  Neurological:     Mental Status: She is alert.  Psychiatric:        Mood and Affect: Mood normal.        Behavior: Behavior normal.        Thought Content: Thought content normal.        Judgment: Judgment normal.    BP 120/79 (BP Location: Left Arm, Patient Position: Sitting, Cuff Size: Large)   Pulse 66   Temp (!) 97 F (36.1 C) (Temporal)   Ht _0  (1.651 m)   Wt 224 lb (101.6 kg)   LMP 02/16/2014   SpO2 95%   BMI 37.28 kg/m  Wt Readings from Last 3 Encounters:  05/27/21 224 lb (101.6 kg)  05/25/21 221 lb (100.2 kg)  04/27/21 227 lb (103 kg)     Health Maintenance Due  Topic Date Due   PNEUMOCOCCAL POLYSACCHARIDE VACCINE AGE 81-64 HIGH RISK  Never done   Pneumococcal Vaccine 33-69 Years old (1 - PCV) Never done   COLONOSCOPY (Pts 45-6yr Insurance coverage will need to be confirmed)  Never done   Zoster Vaccines- Shingrix (1 of 2) Never done   FOOT EXAM  03/28/2019   OPHTHALMOLOGY EXAM  04/10/2019   PAP SMEAR-Modifier  10/27/2019    There  are no preventive care reminders to display for this patient.  Lab Results  Component Value Date   TSH 1.240 11/03/2020   Lab Results  Component Value Date   WBC 12.6 (H) 04/21/2021   HGB 15.1 04/21/2021   HCT 45.4 04/21/2021   MCV 98 (H) 04/21/2021   PLT 312 04/21/2021   Lab Results  Component Value Date   NA 136 04/21/2021   K 5.3 (H) 04/21/2021   CO2 23 04/21/2021   GLUCOSE 100 (H) 04/21/2021   BUN 15 04/21/2021   CREATININE 0.85 04/21/2021   BILITOT 0.5 04/21/2021   ALKPHOS 94 04/21/2021   AST 28 04/21/2021   ALT 48 (H) 04/21/2021   PROT 7.5 04/21/2021   ALBUMIN 4.6 04/21/2021   CALCIUM 10.1 05/04/2021   ANIONGAP 8 07/16/2019   EGFR 80 04/21/2021   Lab Results  Component Value Date   CHOL 127 04/13/2021   Lab Results  Component Value Date   HDL 51 04/13/2021   Lab Results  Component Value Date   LDLCALC 52 04/13/2021   Lab Results  Component Value Date   TRIG 141 04/13/2021   Lab Results  Component Value Date   CHOLHDL 4.2 11/03/2020   Lab Results  Component Value Date   HGBA1C 5.9 (H) 04/13/2021      Assessment & Plan:   Problem List Items Addressed This Visit       Endocrine   Hyperlipidemia associated with type 2 diabetes mellitus ([place]    -check lipids at physical         Other   Vitamin  D deficiency    Last vitamin D Lab Results  Component Value Date   VD25OH 36.0 05/04/2021  -WNL      Hypercalcemia    -PTH, vit D, and calcium levels normal with this set of labs -recheck calcium at time of physical        No orders of the defined types were placed in this encounter.   Follow-up: Return if symptoms worsen or fail to improve.    Noreene Larsson, NP

## 2021-05-27 NOTE — Patient Instructions (Signed)
We will see you back at the time of your physical.

## 2021-05-27 NOTE — Addendum Note (Signed)
Addended by: Demetrius Revel on: 05/27/2021 12:31 PM   Modules accepted: Orders

## 2021-05-27 NOTE — Assessment & Plan Note (Signed)
-  check lipids at physical

## 2021-05-27 NOTE — Assessment & Plan Note (Signed)
-  PTH, vit D, and calcium levels normal with this set of labs -recheck calcium at time of physical

## 2021-05-27 NOTE — Assessment & Plan Note (Signed)
Last vitamin D Lab Results  Component Value Date   VD25OH 36.0 05/04/2021   -WNL

## 2021-06-13 ENCOUNTER — Other Ambulatory Visit: Payer: Self-pay | Admitting: Orthopedic Surgery

## 2021-06-21 ENCOUNTER — Other Ambulatory Visit: Payer: Self-pay | Admitting: Orthopedic Surgery

## 2021-06-21 MED ORDER — CELECOXIB 200 MG PO CAPS: 200.0000 mg | ORAL_CAPSULE | Freq: Two times a day (BID) | ORAL | 0 refills | Status: AC

## 2021-06-21 MED ORDER — CYCLOBENZAPRINE HCL 10 MG PO TABS: ORAL_TABLET | ORAL | 0 refills | Status: DC

## 2021-06-21 NOTE — Telephone Encounter (Signed)
Patient called to request refills on the 2 medications prescribed by Dr Amedeo Kinsman; relays her shoulder is still hurting some. Aware that Dr Amedeo Kinsman is out of clinic this week. cyclobenzaprine (FLEXERIL) 10 MG tablet 20 tablet   celecoxib (CELEBREX) 200 MG capsule 60 capsule  Pharmacy: [facility]

## 2021-07-05 ENCOUNTER — Other Ambulatory Visit: Payer: Self-pay

## 2021-07-05 ENCOUNTER — Ambulatory Visit (INDEPENDENT_AMBULATORY_CARE_PROVIDER_SITE_OTHER): Payer: Medicaid Other | Admitting: Orthopedic Surgery

## 2021-07-05 ENCOUNTER — Encounter: Payer: Self-pay | Admitting: Orthopedic Surgery

## 2021-07-05 VITALS — BP 139/58 | HR 84 | Ht 65.0 in | Wt 225.0 lb

## 2021-07-05 DIAGNOSIS — M7581 Other shoulder lesions, right shoulder: Secondary | ICD-10-CM | POA: Diagnosis not present

## 2021-07-05 NOTE — Progress Notes (Signed)
Orthopaedic Clinic Return  Assessment: Michaela Aguilar is a 56 y.o. female with the following: Right shoulder rotator cuff tendinitis; possible tear of the rotator cuff tendons  Plan: Steroid injection of the right shoulder improved her pain for approximately 1 week.  Since then, her pain has returned.  Pain is worse at night.  She has difficulty with overhead motion.  Concerned that she has injured the rotator cuff tendons, and I recommended an MRI for further evaluation.  She is in agreement with this plan.  Order will be placed today, and we will see her in follow-up, once the MRI has been completed.    Follow-up: Return for After MRI.   Subjective:  Chief Complaint  Patient presents with   Shoulder Pain    Aches in right shoulder all the time worse at night can't lift anything or put down a jug of milk without pain    Neck Pain    States pain in neck into right arm     History of Present Illness: Michaela Aguilar is a 55 y.o. female who returns to clinic for repeat evaluation of her right shoulder.  She was last seen in clinic a little over a month ago.  At that visit, she received a steroid injection.  Her pain improved for approximately 1 week.  Since then, the pain gradually returned.  She has no difficulty with motions below the level of her shoulder.  However, anything above the level of her shoulder is very painful.  Pain gets worse at night.  Onset of pain was following an injury, where she felt a pop approximately 2 to 3 months ago.  Review of Systems: No fevers or chills No numbness or tingling No chest pain No shortness of breath No bowel or bladder dysfunction No GI distress No headaches   Objective: BP (!) 139/58   Pulse 84   Ht '5\' 5"'$  (1.651 m)   Wt 225 lb (102.1 kg)   LMP 02/16/2014   BMI 37.44 kg/m   Physical Exam:  Alert and oriented.  No acute distress.  Evaluation right shoulder demonstrates no deformity.  She is able to achieve forward flexion of  approximately 140 degrees, but there is obvious discomfort.  Positive drop arm test.  Pain in the empty can testing position.  4/5 strength in the supraspinatus and infraspinatus.  Negative belly press.  Fingers warm and well-perfused.  IMAGING: I personally ordered and reviewed the following images:  No new imaging obtained today.  Mordecai Rasmussen, MD 07/05/2021 1:14 PM

## 2021-07-07 ENCOUNTER — Encounter: Payer: Self-pay | Admitting: Internal Medicine

## 2021-07-07 ENCOUNTER — Ambulatory Visit (INDEPENDENT_AMBULATORY_CARE_PROVIDER_SITE_OTHER): Payer: Medicaid Other | Admitting: Internal Medicine

## 2021-07-07 ENCOUNTER — Other Ambulatory Visit: Payer: Self-pay

## 2021-07-07 VITALS — BP 120/70 | HR 93 | Ht 65.0 in | Wt 227.0 lb

## 2021-07-07 DIAGNOSIS — R002 Palpitations: Secondary | ICD-10-CM | POA: Diagnosis not present

## 2021-07-07 NOTE — Progress Notes (Addendum)
Cardiology Office Note   Date:  07/07/2021   ID:  Michaela Aguilar 1966/10/27, MRN [id number]  PCP:  Noreene Larsson, NP  Cardiologist:   Dorris Carnes, MD   Pateint referred for evaluation of lightheadedness and dizziness    History of Present Illness: Michaela Aguilar is a 55 y.o. female with a history of DM, HTN, HL    Followed by Richardean Canal   Referred for eval of palptations, dizziness   The pt says problems started about 4 to 5 months ago  She says it started coming on less frequently, now getting more again.   Spells can last a few hours, easing gradually   SOmetimes they are brief   She denies syncope       Pt denies CP    Breathing is OK      No stimulants/cafeine      Current Meds  Medication Sig   albuterol (PROAIR HFA) 108 (90 Base) MCG/ACT inhaler INHALE 2 PUFFS INTO LUNGS EVERY 6 HOURS AS NEEDED.   atorvastatin (LIPITOR) 40 MG tablet TAKE ONE TABLET BY MOUTH ONCE DAILY.   blood glucose meter kit and supplies Dispense based on patient and insurance preference. Use up to four times daily as directed. (FOR ICD-10 E10.9, E11.9).   celecoxib (CELEBREX) 200 MG capsule Take 1 capsule (200 mg total) by mouth 2 (two) times daily.   cyclobenzaprine (FLEXERIL) 10 MG tablet TAKE (1) TABLET BY MOUTH TWICE DAILY AS NEEDED FOR MUSCLE SPASMS.   famotidine (PEPCID) 20 MG tablet Take 1 tablet (20 mg total) by mouth 2 (two) times daily.   glipiZIDE (GLUCOTROL) 5 MG tablet TAKE (1) TABLET BY MOUTH TWICE DAILY BEFORE MEALS.   lisinopril (ZESTRIL) 10 MG tablet TAKE ONE TABLET BY MOUTH ONCE DAILY.   Multiple Vitamins-Minerals (MULTIVITAMIN WITH MINERALS) tablet Take 1 tablet by mouth daily.   Omega-3 Fatty Acids (FISH OIL) 1000 MG CAPS Take 1 capsule by mouth in the morning and at bedtime.   Vitamin D, Ergocalciferol, (DRISDOL) 1.25 MG (50000 UNIT) CAPS capsule TAKE 1TABLET WEEKLY. (SAME DAY EACH WEEK)     Allergies:   Patient has no known allergies.   Past Medical History:  Diagnosis Date    Anxiety    Bronchitis    Chronic pain of left knee 04/03/2017   Chronic pain of right knee 08/16/2017   Depression    Encounter for screening for malignant neoplasm of cervix 11/08/2019   Encounter for screening for malignant neoplasm of colon 11/08/2019   Encounter for screening mammogram for malignant neoplasm of breast 11/08/2019   Gestational diabetes    1992   History of kidney stones    Kidney stones per pt.   Low back pain 11/17/2015   Neck pain 11/17/2015    Past Surgical History:  Procedure Laterality Date   CESAREAN SECTION     x2   CYSTOSCOPY WITH RETROGRADE PYELOGRAM, URETEROSCOPY AND STENT PLACEMENT Right 08/18/2019   Procedure: CYSTOSCOPY WITH RETROGRADE PYELOGRAM, URETEROSCOPY AND STENT PLACEMENT;  Surgeon: Cleon Gustin, MD;  Location: [facility];  Service: Urology;  Laterality: Right;   CYSTOSCOPY WITH STENT PLACEMENT Right 09/06/2016   Procedure: CYSTOSCOPY WITH RIGHT URETERAL STENT PLACEMENT;  Surgeon: Cleon Gustin, MD;  Location: [facility];  Service: Urology;  Laterality: Right;   CYSTOSCOPY/RETROGRADE/URETEROSCOPY/STONE EXTRACTION WITH BASKET Right 09/06/2016   Procedure: CYSTOSCOPY/RIGHT RETROGRADE PYELOGRAM, RIGHT RENAL STONE EXTRACTION WITH LASER;  Surgeon: Cleon Gustin, MD;  Location: [facility];  Service:  Urology;  Laterality: Right;   EXTRACORPOREAL SHOCK WAVE LITHOTRIPSY  1995 2010   HERNIA REPAIR     incisional hernia   STONE EXTRACTION WITH BASKET Right 08/18/2019   Procedure: STONE EXTRACTION WITH BASKET;  Surgeon: Cleon Gustin, MD;  Location: [facility];  Service: Urology;  Laterality: Right;     Social History:  The patient  reports that she has been smoking cigarettes and cigars. She has a 3.75 pack-year smoking history. She has never used smokeless tobacco. She reports that she does not currently use alcohol. She reports that she does not use drugs.   Family History:  The patient's family history includes COPD in her maternal uncle; Cancer in  her maternal grandfather and maternal uncle; Depression in her father and maternal uncle; Emphysema in her maternal grandmother; Mental illness in her father; Osteoporosis in her mother.    ROS:  Please see the history of present illness. All other systems are reviewed and  Negative to the above problem except as noted.    PHYSICAL EXAM: VS:  BP 120/70 (BP Location: Left Arm, Patient Position: Sitting, Cuff Size: Large)   Pulse 93   Ht _0  (1.651 m)   Wt 227 lb (103 kg)   LMP 02/16/2014   SpO2 97%   BMI 37.77 kg/m     GEN: Morbidly obese 55 yo in no acute distress  HEENT: normal  Neck: no JVD, carotid bruits Cardiac: RRR; no murmurs  no LE  edema  Respiratory:  clear to auscultation bilaterally, normal work of breathing GI: soft, nontender, nondistended, + BS  No hepatomegaly  MS: no deformity Moving all extremities   Skin: warm and dry, no rash Neuro:  Strength and sensation are intact Psych: euthymic mood, full affect   EKG:  EKG is not ordered today.  On 04/21/21   SR 69 bpm   Lipid Panel    Component Value Date/Time   CHOL 127 04/13/2021 1134   TRIG 141 04/13/2021 1134   HDL 51 04/13/2021 1134   CHOLHDL 4.2 11/03/2020 0901   CHOLHDL 2.7 02/04/2020 0918   VLDL 33 07/16/2019 0805   LDLCALC 52 04/13/2021 1134   LDLCALC 54 02/04/2020 0918      Wt Readings from Last 3 Encounters:  07/07/21 227 lb (103 kg)  07/05/21 225 lb (102.1 kg)  05/27/21 224 lb (101.6 kg)      ASSESSMENT AND PLAN:   Dizziness  The pt's BP is low normal today  I would recomm she increase fluids, salt   Follow symptoms   Stay active  Work up of palpitaitons    2  Palpitations   Symptoms do not sound like sudden on/off like arrhythmia    Will set up for monitor to evaluate, see how correlate with dizzienss    Set up for 30 day monitor  3  Lipids   LDL 52   HDL 51   Keep on lipitor    F/U based on results of testing      Current medicines are reviewed at length with the patient today.   The patient does not have concerns regarding medicines.  Signed, Dorris Carnes, MD  07/07/2021 3:34 PM    [facility] [address], [place], [place]  [address] Phone: [phone]; Fax: [phone]

## 2021-07-07 NOTE — Patient Instructions (Signed)
Medication Instructions:  Your physician recommends that you continue on your current medications as directed. Please refer to the Current Medication list given to you today.  *If you need a refill on your cardiac medications before your next appointment, please call your pharmacy*   Lab Work: NONE   If you have labs (blood work) drawn today and your tests are completely normal, you will receive your results only by: [facility] (if you have MyChart) OR A paper copy in the mail If you have any lab test that is abnormal or we need to change your treatment, we will call you to review the results.   Testing/Procedures: Call office if you have another episode of palpitations and will do a 30 day monitor.    Follow-Up: At [facility], you and your health needs are our priority.  As part of our continuing mission to provide you with exceptional heart care, we have created designated Provider Care Teams.  These Care Teams include your primary Cardiologist (physician) and Advanced Practice Providers (APPs -  Physician Assistants and Nurse Practitioners) who all work together to provide you with the care you need, when you need it.  We recommend signing up for the patient portal called "MyChart".  Sign up information is provided on this After Visit Summary.  MyChart is used to connect with patients for Virtual Visits (Telemedicine).  Patients are able to view lab/test results, encounter notes, upcoming appointments, etc.  Non-urgent messages can be sent to your provider as well.   To learn more about what you can do with MyChart, go to [email or web address].    Your next appointment:    To Be Determined   The format for your next appointment:   In Person  Provider:   Dorris Carnes, MD   Other Instructions Thank you for choosing [facility]!

## 2021-07-15 ENCOUNTER — Other Ambulatory Visit: Payer: Self-pay

## 2021-07-15 ENCOUNTER — Ambulatory Visit (HOSPITAL_COMMUNITY)
Admission: RE | Admit: 2021-07-15 | Discharge: 2021-07-15 | Disposition: A | Discharge status: Home / Self Care | Payer: Medicaid Other | Source: Ambulatory Visit | Attending: Orthopedic Surgery | Admitting: Orthopedic Surgery

## 2021-07-15 DIAGNOSIS — M25511 Pain in right shoulder: Secondary | ICD-10-CM | POA: Diagnosis not present

## 2021-07-15 DIAGNOSIS — M7581 Other shoulder lesions, right shoulder: Secondary | ICD-10-CM | POA: Insufficient documentation

## 2021-07-26 ENCOUNTER — Telehealth: Payer: Self-pay | Admitting: Orthopedic Surgery

## 2021-07-26 NOTE — Telephone Encounter (Signed)
Patient called to  (1) ask if she may have a refill on the muscle relaxer:  cyclobenzaprine (FLEXERIL) 10 MG tablet 20 tablet    Pharmacy: [facility]   (2) ask if any possibility of seeing Dr Amedeo Kinsman sooner than 08/02/21 for her results.

## 2021-07-27 ENCOUNTER — Other Ambulatory Visit: Payer: Self-pay

## 2021-07-27 MED ORDER — CYCLOBENZAPRINE HCL 10 MG PO TABS: ORAL_TABLET | ORAL | 0 refills | Status: DC

## 2021-07-27 NOTE — Telephone Encounter (Signed)
LVM to call back and discuss options.

## 2021-07-27 NOTE — Telephone Encounter (Signed)
Patient called back in response; relays she would like to keep appointment for office visit as is, for 08/02/21.

## 2021-07-27 NOTE — Telephone Encounter (Signed)
Pt has an appointment to review shoulder MRI results on 9/20. You're pretty booked up until then, would you consider a phone visit?

## 2021-07-28 ENCOUNTER — Other Ambulatory Visit: Payer: Self-pay

## 2021-08-02 ENCOUNTER — Ambulatory Visit (INDEPENDENT_AMBULATORY_CARE_PROVIDER_SITE_OTHER): Payer: Medicaid Other | Admitting: Orthopedic Surgery

## 2021-08-02 ENCOUNTER — Other Ambulatory Visit: Payer: Self-pay

## 2021-08-02 DIAGNOSIS — S46011D Strain of muscle(s) and tendon(s) of the rotator cuff of right shoulder, subsequent encounter: Secondary | ICD-10-CM

## 2021-08-03 ENCOUNTER — Encounter: Payer: Self-pay | Admitting: Orthopedic Surgery

## 2021-08-03 NOTE — Progress Notes (Signed)
Orthopaedic Clinic Return  Assessment: Michaela Aguilar is a 55 y.o. female with the following: Right shoulder rotator cuff tear, supraspinatus with retraction to the mid humeral head  Plan: Return to clinic today for evaluation of the MRI results.  She does have a full-thickness tear of the supraspinatus, with retraction to approximately the mid humeral head.  She like to avoid surgery if possible.  She will continue with medications that she is currently taking.  It is too early to proceed with another steroid injection, but she is interested when she is able.  We briefly discussed the possibility of proceeding with surgery, including rotator cuff repair and possible superior capsular reconstruction.  Once again, she reiterated that she is not interested in surgery, and is hopeful that her pain and function will return.    Follow-up: Return in about 22 days (around 08/24/2021) for any time 10/12 or later.   Subjective:  Chief Complaint  Patient presents with   Results    MRI LT Shoulder    History of Present Illness: Michaela Aguilar is a 55 y.o. female who returns to clinic for repeat evaluation of her right shoulder.  She continues to have pain in her right shoulder.  She has okay range of motion, but notes pain with certain movements, especially when she is dropping her arm.  And gets worse at night.  She has had an MRI, and is here to discuss the results.  Review of Systems: No fevers or chills No numbness or tingling No chest pain No shortness of breath No bowel or bladder dysfunction No GI distress No headaches   Objective: LMP 02/16/2014   Physical Exam:  Alert and oriented.  No acute distress.  Evaluation right shoulder demonstrates no deformity.  She is able to achieve forward flexion of approximately 140 degrees, but there is obvious discomfort.  Positive drop arm test.  Pain in the empty can testing position.  Negative belly press.  Fingers warm and  well-perfused.  IMAGING: I personally ordered and reviewed the following images:  Right shoulder MRI  IMPRESSION: 1. The study is moderately motion degraded, limiting intra-articular detail. 2. There is evidence of a full-thickness, moderately retracted supraspinatus tendon tear. The infraspinatus tendon appears partially torn. Mild associated muscular atrophy. 3. The biceps tendon appears intact within the bicipital groove, although its intra-articular portion is not well visualized. 4. No evidence of labral tear or acute osseous findings.  Mordecai Rasmussen, MD 08/03/2021 8:12 AM

## 2021-08-15 ENCOUNTER — Other Ambulatory Visit: Payer: Self-pay | Admitting: Nurse Practitioner

## 2021-08-15 ENCOUNTER — Other Ambulatory Visit: Payer: Self-pay | Admitting: Orthopedic Surgery

## 2021-08-15 DIAGNOSIS — J449 Chronic obstructive pulmonary disease, unspecified: Secondary | ICD-10-CM

## 2021-08-15 DIAGNOSIS — E1169 Type 2 diabetes mellitus with other specified complication: Secondary | ICD-10-CM

## 2021-08-15 DIAGNOSIS — E119 Type 2 diabetes mellitus without complications: Secondary | ICD-10-CM

## 2021-08-15 DIAGNOSIS — E559 Vitamin D deficiency, unspecified: Secondary | ICD-10-CM

## 2021-08-30 ENCOUNTER — Encounter: Payer: Self-pay | Admitting: Orthopedic Surgery

## 2021-08-30 ENCOUNTER — Other Ambulatory Visit: Payer: Self-pay

## 2021-08-30 ENCOUNTER — Ambulatory Visit (INDEPENDENT_AMBULATORY_CARE_PROVIDER_SITE_OTHER): Payer: Medicaid Other | Admitting: Orthopedic Surgery

## 2021-08-30 DIAGNOSIS — S46011D Strain of muscle(s) and tendon(s) of the rotator cuff of right shoulder, subsequent encounter: Secondary | ICD-10-CM

## 2021-08-30 MED ORDER — CYCLOBENZAPRINE HCL 10 MG PO TABS: 10.0000 mg | ORAL_TABLET | Freq: Two times a day (BID) | ORAL | 0 refills | Status: DC | PRN

## 2021-08-30 NOTE — Patient Instructions (Signed)

## 2021-08-30 NOTE — Progress Notes (Signed)
Orthopaedic Clinic Return  Assessment: Michaela Aguilar is a 55 y.o. female with the following: Right shoulder rotator cuff tear, supraspinatus with retraction to the mid humeral head  Plan: Pain and motion improved since the last visit.  She is interested in a repeat injection.  She states the flexeril is helpful for night pain, refill provided.  Follow up as needed.  Recommend continue with exercises for shoulder, cautious return to activity.  Procedure note injection - Right shoulder    Verbal consent was obtained to inject the right shoulder, subacromial space Timeout was completed to confirm the site of injection.   The skin was prepped with alcohol and ethyl chloride was sprayed at the injection site.  A 21-gauge needle was used to inject 40 mg of Depo-Medrol and 1% lidocaine (3 cc) into the subacromial space of the right shoulder using a posterolateral approach.  There were no complications.  A sterile bandage was applied.    Follow-up: Return if symptoms worsen or fail to improve.   Subjective:  Chief Complaint  Patient presents with   Shoulder Pain    RIGHT shoulder   Injections    Request injection in right shoulder    History of Present Illness: Michaela Aguilar is a 55 y.o. female who returns to clinic for repeat evaluation of her right shoulder.  Pain has improved since the last visit.  She is able to move her arm without the same amount of pain.  She is doing home exercises.  She would like another injection.  Flexeril is helping at night time.   Review of Systems: No fevers or chills No numbness or tingling No chest pain No shortness of breath No bowel or bladder dysfunction No GI distress No headaches   Objective: LMP 02/16/2014   Physical Exam:  Alert and oriented.  No acute distress.  Evaluation right shoulder demonstrates no deformity.  She is able to achieve forward flexion of approximately 160 degrees, without obvious discomfort.  Minimal pain in the  empty can testing position.  Negative belly press.  Fingers warm and well-perfused.  IMAGING: I personally ordered and reviewed the following images:  Right shoulder MRI  IMPRESSION: 1. The study is moderately motion degraded, limiting intra-articular detail. 2. There is evidence of a full-thickness, moderately retracted supraspinatus tendon tear. The infraspinatus tendon appears partially torn. Mild associated muscular atrophy. 3. The biceps tendon appears intact within the bicipital groove, although its intra-articular portion is not well visualized. 4. No evidence of labral tear or acute osseous findings.  Michaela Rasmussen, MD 08/30/2021 8:55 AM

## 2021-09-06 DIAGNOSIS — Z23 Encounter for immunization: Secondary | ICD-10-CM | POA: Diagnosis not present

## 2021-09-28 ENCOUNTER — Telehealth: Payer: Self-pay

## 2021-09-28 ENCOUNTER — Other Ambulatory Visit: Payer: Self-pay

## 2021-09-28 DIAGNOSIS — R030 Elevated blood-pressure reading, without diagnosis of hypertension: Secondary | ICD-10-CM

## 2021-09-28 DIAGNOSIS — E119 Type 2 diabetes mellitus without complications: Secondary | ICD-10-CM

## 2021-09-28 MED ORDER — LISINOPRIL 10 MG PO TABS
10.0000 mg | ORAL_TABLET | Freq: Every day | ORAL | 0 refills | Status: DC
Start: 2021-09-28 — End: 2021-11-03

## 2021-09-28 NOTE — Telephone Encounter (Signed)
Refill sent.

## 2021-09-28 NOTE — Telephone Encounter (Signed)
Patient called need med refills  lisinopril (ZESTRIL) 10 MG tablet   Pharmacy:  [facility]

## 2021-10-13 ENCOUNTER — Other Ambulatory Visit: Payer: Self-pay | Admitting: Orthopedic Surgery

## 2021-11-02 ENCOUNTER — Other Ambulatory Visit: Payer: Self-pay | Admitting: Orthopedic Surgery

## 2021-11-03 ENCOUNTER — Ambulatory Visit (INDEPENDENT_AMBULATORY_CARE_PROVIDER_SITE_OTHER): Payer: Medicaid Other | Admitting: Nurse Practitioner

## 2021-11-03 ENCOUNTER — Other Ambulatory Visit: Payer: Self-pay

## 2021-11-03 ENCOUNTER — Encounter: Payer: Medicaid Other | Admitting: Family Medicine

## 2021-11-03 ENCOUNTER — Encounter: Payer: Self-pay | Admitting: Nurse Practitioner

## 2021-11-03 VITALS — BP 134/86 | HR 90 | Ht 65.0 in | Wt 225.1 lb

## 2021-11-03 DIAGNOSIS — E1169 Type 2 diabetes mellitus with other specified complication: Secondary | ICD-10-CM

## 2021-11-03 DIAGNOSIS — R030 Elevated blood-pressure reading, without diagnosis of hypertension: Secondary | ICD-10-CM

## 2021-11-03 DIAGNOSIS — Z1211 Encounter for screening for malignant neoplasm of colon: Secondary | ICD-10-CM | POA: Diagnosis not present

## 2021-11-03 DIAGNOSIS — E119 Type 2 diabetes mellitus without complications: Secondary | ICD-10-CM

## 2021-11-03 DIAGNOSIS — E559 Vitamin D deficiency, unspecified: Secondary | ICD-10-CM | POA: Diagnosis not present

## 2021-11-03 DIAGNOSIS — Z124 Encounter for screening for malignant neoplasm of cervix: Secondary | ICD-10-CM | POA: Diagnosis not present

## 2021-11-03 DIAGNOSIS — Z0001 Encounter for general adult medical examination with abnormal findings: Secondary | ICD-10-CM | POA: Diagnosis not present

## 2021-11-03 DIAGNOSIS — E785 Hyperlipidemia, unspecified: Secondary | ICD-10-CM

## 2021-11-03 MED ORDER — ATORVASTATIN CALCIUM 40 MG PO TABS: 40.0000 mg | ORAL_TABLET | Freq: Every day | ORAL | 3 refills | Status: AC

## 2021-11-03 MED ORDER — LISINOPRIL 10 MG PO TABS: 10.0000 mg | ORAL_TABLET | Freq: Every day | ORAL | 3 refills | Status: AC

## 2021-11-03 MED ORDER — OZEMPIC (0.25 OR 0.5 MG/DOSE) 2 MG/1.5ML ~~LOC~~ SOPN: 0.5000 mg | PEN_INJECTOR | SUBCUTANEOUS | 0 refills | Status: DC

## 2021-11-03 MED ORDER — VITAMIN D (ERGOCALCIFEROL) 1.25 MG (50000 UNIT) PO CAPS: ORAL_CAPSULE | ORAL | 0 refills | Status: DC

## 2021-11-03 MED ORDER — PEN NEEDLES 32G X 5 MM MISC
0 refills | Status: AC
Start: 2021-11-03 — End: ?

## 2021-11-03 MED ORDER — OZEMPIC (1 MG/DOSE) 4 MG/3ML ~~LOC~~ SOPN: 1.0000 mg | PEN_INJECTOR | SUBCUTANEOUS | 3 refills | Status: DC

## 2021-11-03 NOTE — Assessment & Plan Note (Signed)
BP Readings from Last 3 Encounters:  11/03/21 134/86  07/07/21 120/70  07/05/21 (!) 139/58   -doing well -continue lisinopril

## 2021-11-03 NOTE — Patient Instructions (Addendum)
Please have fasting labs drawn today.  I will be moving to [facility] located at [address], [place], [place] [address] effective Nov 13, 2021. If you would like to establish care with [facility] please call [phone].

## 2021-11-03 NOTE — Progress Notes (Signed)
Acute Office Visit  Subjective:    Patient ID: Michaela Aguilar, female    DOB: 06/09/1966, 55 y.o.   MRN: [id number]  Chief Complaint  Patient presents with   Annual Exam    CPE    HPI Patient is in today for physical exam. She has right shoulder pain, but she is seeing ortho for this. No other acute concerns.  Past Medical History:  Diagnosis Date   Anxiety    Bronchitis    Chronic pain of left knee 04/03/2017   Chronic pain of right knee 08/16/2017   Depression    Encounter for screening for malignant neoplasm of cervix 11/08/2019   Encounter for screening for malignant neoplasm of colon 11/08/2019   Encounter for screening mammogram for malignant neoplasm of breast 11/08/2019   Gestational diabetes    1992   History of kidney stones    Kidney stones per pt.   Low back pain 11/17/2015   Neck pain 11/17/2015    Past Surgical History:  Procedure Laterality Date   CESAREAN SECTION     x2   CYSTOSCOPY WITH RETROGRADE PYELOGRAM, URETEROSCOPY AND STENT PLACEMENT Right 08/18/2019   Procedure: CYSTOSCOPY WITH RETROGRADE PYELOGRAM, URETEROSCOPY AND STENT PLACEMENT;  Surgeon: Cleon Gustin, MD;  Location: [facility];  Service: Urology;  Laterality: Right;   CYSTOSCOPY WITH STENT PLACEMENT Right 09/06/2016   Procedure: CYSTOSCOPY WITH RIGHT URETERAL STENT PLACEMENT;  Surgeon: Cleon Gustin, MD;  Location: [facility];  Service: Urology;  Laterality: Right;   CYSTOSCOPY/RETROGRADE/URETEROSCOPY/STONE EXTRACTION WITH BASKET Right 09/06/2016   Procedure: CYSTOSCOPY/RIGHT RETROGRADE PYELOGRAM, RIGHT RENAL STONE EXTRACTION WITH LASER;  Surgeon: Cleon Gustin, MD;  Location: [facility];  Service: Urology;  Laterality: Right;   EXTRACORPOREAL SHOCK WAVE LITHOTRIPSY  1995 2010   HERNIA REPAIR     incisional hernia   STONE EXTRACTION WITH BASKET Right 08/18/2019   Procedure: STONE EXTRACTION WITH BASKET;  Surgeon: Cleon Gustin, MD;  Location: [facility];  Service: Urology;  Laterality:  Right;    Family History  Problem Relation Age of Onset   Osteoporosis Mother    Depression Father    Mental illness Father    Cancer Maternal Uncle        lung cancer   COPD Maternal Uncle    Depression Maternal Uncle    Emphysema Maternal Grandmother    Cancer Maternal Grandfather        lung cancer   Breast cancer Neg Hx     Social History   Socioeconomic History   Marital status: Divorced    Spouse name: Not on file   Number of children: 2   Years of education: Not on file   Highest education level: Some college, no degree  Occupational History   Not on file  Tobacco Use   Smoking status: Every Day    Packs/day: 0.25    Years: 15.00    Pack years: 3.75    Types: Cigarettes, Cigars   Smokeless tobacco: Never  Vaping Use   Vaping Use: Never used  Substance and Sexual Activity   Alcohol use: Not Currently   Drug use: No    Types: Marijuana    Comment: last marijuana use early October 2017   Sexual activity: Yes    Birth control/protection: None, Post-menopausal  Other Topics Concern   Not on file  Social History Narrative   Lives with roommate   2 children (grown)   1 daughter no grandbabies  1 son-2 grandbabies 1 month and 62 year old (daughters)   2 cats: Bella and Tip       Enjoys: swim and ride bike enjoys being outside       Diet:eats all food groups, enjoys bread-air frying more, needs more veggies    Caffeine: soda-sugar free- one daily,    Water: 3-4 cups daily       Wears seat belt   Smoke detectors    Does not use phone while driving    Social Determinants of Corporate investment banker Strain: Low Risk    Difficulty of Paying Living Expenses: Not hard at all  Food Insecurity: No Food Insecurity   Worried About Programme researcher, broadcasting/film/video in the Last Year: Never true   Barista in the Last Year: Never true  Transportation Needs: No Transportation Needs   Lack of Transportation (Medical): No   Lack of Transportation (Non-Medical): No   Physical Activity: Sufficiently Active   Days of Exercise per Week: 7 days   Minutes of Exercise per Session: 30 min  Stress: Not on file  Social Connections: Socially Isolated   Frequency of Communication with Friends and Family: More than three times a week   Frequency of Social Gatherings with Friends and Family: More than three times a week   Attends Religious Services: Never   Database administrator or Organizations: No   Attends Engineer, structural: Never   Marital Status: Divorced  Catering manager Violence: Not on file    Outpatient Medications Prior to Visit  Medication Sig Dispense Refill   albuterol (PROAIR HFA) 108 (90 Base) MCG/ACT inhaler INHALE 2 PUFFS INTO LUNGS EVERY 6 HOURS AS NEEDED. 8.5 g 0   blood glucose meter kit and supplies Dispense based on patient and insurance preference. Use up to four times daily as directed. (FOR ICD-10 E10.9, E11.9). 1 each 0   cyclobenzaprine (FLEXERIL) 10 MG tablet TAKE (1) TABLET BY MOUTH TWICE DAILY AS NEEDED FOR MUSCLE SPASMS. 20 tablet 0   famotidine (PEPCID) 20 MG tablet Take 1 tablet (20 mg total) by mouth 2 (two) times daily. 30 tablet 1   Multiple Vitamins-Minerals (MULTIVITAMIN WITH MINERALS) tablet Take 1 tablet by mouth daily.     Omega-3 Fatty Acids (FISH OIL) 1000 MG CAPS Take 1 capsule by mouth in the morning and at bedtime.     atorvastatin (LIPITOR) 40 MG tablet TAKE ONE TABLET BY MOUTH ONCE DAILY. 90 tablet 0   glipiZIDE (GLUCOTROL) 5 MG tablet TAKE (1) TABLET BY MOUTH TWICE DAILY BEFORE MEALS. 180 tablet 0   lisinopril (ZESTRIL) 10 MG tablet Take 1 tablet (10 mg total) by mouth daily. 90 tablet 0   Vitamin D, Ergocalciferol, (DRISDOL) 1.25 MG (50000 UNIT) CAPS capsule TAKE 1 TABLET WEEKLY. (SAME DAY EACH WEEK) 12 capsule 0   No facility-administered medications prior to visit.    No Known Allergies  Review of Systems  Constitutional: Negative.   HENT: Negative.    Eyes: Negative.   Respiratory:  Negative.    Cardiovascular: Negative.   Gastrointestinal: Negative.   Endocrine: Negative.   Genitourinary: Negative.   Musculoskeletal:        Right shoulder pain  Skin: Negative.   Allergic/Immunologic: Negative.   Neurological: Negative.   Hematological: Negative.   Psychiatric/Behavioral: Negative.        Objective:    Physical Exam Constitutional:      Appearance: Normal appearance. She is obese.  HENT:     Head: Normocephalic and atraumatic.     Right Ear: Tympanic membrane, ear canal and external ear normal.     Left Ear: Tympanic membrane, ear canal and external ear normal.     Nose: Nose normal.     Mouth/Throat:     Mouth: Mucous membranes are moist.     Pharynx: Oropharynx is clear.  Eyes:     Extraocular Movements: Extraocular movements intact.     Conjunctiva/sclera: Conjunctivae normal.     Pupils: Pupils are equal, round, and reactive to light.  Cardiovascular:     Rate and Rhythm: Normal rate and regular rhythm.     Pulses: Normal pulses.          Posterior tibial pulses are 2+ on the right side and 2+ on the left side.     Heart sounds: Normal heart sounds.  Pulmonary:     Effort: Pulmonary effort is normal.     Breath sounds: Normal breath sounds.  Abdominal:     General: Abdomen is flat. Bowel sounds are normal.     Palpations: Abdomen is soft.  Musculoskeletal:        General: Normal range of motion.     Cervical back: Normal range of motion and neck supple.  Feet:     Right foot:     Protective Sensation: 10 sites tested.  10 sites sensed.     Skin integrity: Dry skin present.     Toenail Condition: Fungal disease present.    Left foot:     Protective Sensation: 10 sites tested.  10 sites sensed.     Skin integrity: Dry skin present.     Toenail Condition: Fungal disease present. Skin:    General: Skin is warm and dry.     Capillary Refill: Capillary refill takes less than 2 seconds.  Neurological:     General: No focal deficit present.      Mental Status: She is alert and oriented to person, place, and time.     Cranial Nerves: No cranial nerve deficit.     Sensory: No sensory deficit.     Motor: No weakness.     Coordination: Coordination normal.     Gait: Gait normal.  Psychiatric:        Mood and Affect: Mood normal.        Behavior: Behavior normal.        Thought Content: Thought content normal.        Judgment: Judgment normal.    BP 134/86    Pulse 90    Ht 5\' 5"  (1.651 m)    Wt 225 lb 1.9 oz (102.1 kg)    LMP 02/16/2014    SpO2 91%    BMI 37.46 kg/m  Wt Readings from Last 3 Encounters:  11/03/21 225 lb 1.9 oz (102.1 kg)  07/07/21 227 lb (103 kg)  07/05/21 225 lb (102.1 kg)    Health Maintenance Due  Topic Date Due   Pneumococcal Vaccine 37-59 Years old (1 - PCV) Never done   COLONOSCOPY (Pts 45-42yrs Insurance coverage will need to be confirmed)  Never done   OPHTHALMOLOGY EXAM  04/10/2019   PAP SMEAR-Modifier  10/27/2019   COVID-19 Vaccine (4 - Booster for Moderna series) 12/10/2020   HEMOGLOBIN A1C  10/13/2021    There are no preventive care reminders to display for this patient.   Lab Results  Component Value Date   TSH 1.240 11/03/2020   Lab Results  Component Value Date   WBC 12.6 (H) 04/21/2021   HGB 15.1 04/21/2021   HCT 45.4 04/21/2021   MCV 98 (H) 04/21/2021   PLT 312 04/21/2021   Lab Results  Component Value Date   NA 136 04/21/2021   K 5.3 (H) 04/21/2021   CO2 23 04/21/2021   GLUCOSE 100 (H) 04/21/2021   BUN 15 04/21/2021   CREATININE 0.85 04/21/2021   BILITOT 0.5 04/21/2021   ALKPHOS 94 04/21/2021   AST 28 04/21/2021   ALT 48 (H) 04/21/2021   PROT 7.5 04/21/2021   ALBUMIN 4.6 04/21/2021   CALCIUM 10.1 05/04/2021   ANIONGAP 8 07/16/2019   EGFR 80 04/21/2021   Lab Results  Component Value Date   CHOL 127 04/13/2021   Lab Results  Component Value Date   HDL 51 04/13/2021   Lab Results  Component Value Date   LDLCALC 52 04/13/2021   Lab Results  Component  Value Date   TRIG 141 04/13/2021   Lab Results  Component Value Date   CHOLHDL 4.2 11/03/2020   Lab Results  Component Value Date   HGBA1C 5.9 (H) 04/13/2021       Assessment & Plan:   Problem List Items Addressed This Visit       Endocrine   Diabetes mellitus without complication ([place])   Relevant Medications   Semaglutide,0.25 or 0.5MG /DOS, (OZEMPIC, 0.25 OR 0.5 MG/DOSE,) 2 MG/1.5ML SOPN   Semaglutide, 1 MG/DOSE, (OZEMPIC, 1 MG/DOSE,) 4 MG/3ML SOPN (Start on 12/01/2021)   atorvastatin (LIPITOR) 40 MG tablet   lisinopril (ZESTRIL) 10 MG tablet   Other Relevant Orders   CBC with Differential/Platelet   CMP14+EGFR   Lipid Panel With LDL/HDL Ratio   Hemoglobin A1c   Hyperlipidemia associated with type 2 diabetes mellitus (H[facility])    -checking A1c and lipids -STOP glipizide -Rx. ozempic -taking atorvastatin and lisinopril Pt unable to tolerate metformin d/t GI distress      Relevant Medications   Semaglutide,0.25 or 0.5MG /DOS, (OZEMPIC, 0.25 OR 0.5 MG/DOSE,) 2 MG/1.5ML SOPN   Semaglutide, 1 MG/DOSE, (OZEMPIC, 1 MG/DOSE,) 4 MG/3ML SOPN (Start on 12/01/2021)   atorvastatin (LIPITOR) 40 MG tablet   lisinopril (ZESTRIL) 10 MG tablet   Other Relevant Orders   Lipid Panel With LDL/HDL Ratio     Other   Morbid obesity ([place])    -Rx. ozempic -starting weight 227 #      Relevant Medications   Semaglutide,0.25 or 0.5MG /DOS, (OZEMPIC, 0.25 OR 0.5 MG/DOSE,) 2 MG/1.5ML SOPN   Semaglutide, 1 MG/DOSE, (OZEMPIC, 1 MG/DOSE,) 4 MG/3ML SOPN (Start on 12/01/2021)   Vitamin D deficiency    -takes weekly vit D, and last vit D was WNL; refilled today      Relevant Medications   Vitamin D, Ergocalciferol, (DRISDOL) 1.25 MG (50000 UNIT) CAPS capsule   Pre-hypertension    BP Readings from Last 3 Encounters:  11/03/21 134/86  07/07/21 120/70  07/05/21 (!) 139/58  -doing well -continue lisinopril      Relevant Medications   lisinopril (ZESTRIL) 10 MG tablet   Encounter for general  adult medical examination with abnormal findings - Primary    -referred to GYN for PAP -ordered cologuard -Prevnar-20 today -Foot exam completed -she has upcoming eye appt      Relevant Orders   CBC with Differential/Platelet   CMP14+EGFR   Lipid Panel With LDL/HDL Ratio   Hemoglobin A1c   Cologuard   Ambulatory referral to Gynecology   Hypercalcemia    -check calcium with  metabolic panel      Other Visit Diagnoses     Colon cancer screening       Relevant Orders   Cologuard   Cervical cancer screening       Relevant Orders   Ambulatory referral to Gynecology        Meds ordered this encounter  Medications   Semaglutide,0.25 or 0.5MG /DOS, (OZEMPIC, 0.25 OR 0.5 MG/DOSE,) 2 MG/1.5ML SOPN    Sig: Inject 0.5 mg into the skin once a week.    Dispense:  2 mL    Refill:  0   Semaglutide, 1 MG/DOSE, (OZEMPIC, 1 MG/DOSE,) 4 MG/3ML SOPN    Sig: Inject 1 mg into the skin once a week.    Dispense:  9 mL    Refill:  3   Insulin Pen Needle (PEN NEEDLES) 32G X 5 MM MISC    Sig: Use to inject ozempic weekly    Dispense:  50 each    Refill:  0   atorvastatin (LIPITOR) 40 MG tablet    Sig: Take 1 tablet (40 mg total) by mouth daily.    Dispense:  90 tablet    Refill:  3   Vitamin D, Ergocalciferol, (DRISDOL) 1.25 MG (50000 UNIT) CAPS capsule    Sig: TAKE 1 TABLET WEEKLY. (SAME DAY EACH WEEK)    Dispense:  12 capsule    Refill:  0   lisinopril (ZESTRIL) 10 MG tablet    Sig: Take 1 tablet (10 mg total) by mouth daily.    Dispense:  90 tablet    Refill:  [place], NP

## 2021-11-03 NOTE — Assessment & Plan Note (Signed)
-  referred to GYN for PAP -ordered cologuard -Prevnar-20 today -Foot exam completed -she has upcoming eye appt

## 2021-11-03 NOTE — Assessment & Plan Note (Signed)
-  takes weekly vit D, and last vit D was WNL; refilled today

## 2021-11-03 NOTE — Assessment & Plan Note (Addendum)
-  Rx. ozempic -starting weight 227 #

## 2021-11-03 NOTE — Assessment & Plan Note (Signed)
-  check calcium with metabolic panel

## 2021-11-03 NOTE — Assessment & Plan Note (Addendum)
-  checking A1c and lipids -STOP glipizide -Rx. ozempic -taking atorvastatin and lisinopril Pt unable to tolerate metformin d/t GI distress

## 2021-11-04 LAB — CMP14+EGFR
ALT: 47 IU/L — ABNORMAL HIGH (ref 0–32)
AST: 29 IU/L (ref 0–40)
Albumin/Globulin Ratio: 1.8 (ref 1.2–2.2)
Albumin: 4.6 g/dL (ref 3.8–4.9)
Alkaline Phosphatase: 90 IU/L (ref 44–121)
BUN/Creatinine Ratio: 17 (ref 9–23)
BUN: 11 mg/dL (ref 6–24)
Bilirubin Total: 0.6 mg/dL (ref 0.0–1.2)
CO2: 24 mmol/L (ref 20–29)
Calcium: 10.3 mg/dL — ABNORMAL HIGH (ref 8.7–10.2)
Chloride: 99 mmol/L (ref 96–106)
Creatinine, Ser: 0.64 mg/dL (ref 0.57–1.00)
Globulin, Total: 2.5 g/dL (ref 1.5–4.5)
Glucose: 129 mg/dL — ABNORMAL HIGH (ref 70–99)
Potassium: 5.1 mmol/L (ref 3.5–5.2)
Sodium: 139 mmol/L (ref 134–144)
Total Protein: 7.1 g/dL (ref 6.0–8.5)
eGFR: 103 mL/min/{1.73_m2} (ref >59)

## 2021-11-04 LAB — CBC WITH DIFFERENTIAL/PLATELET
Basophils Absolute: 0.1 10*3/uL (ref 0.0–0.2)
Basos: 1 %
EOS (ABSOLUTE): 0.1 10*3/uL (ref 0.0–0.4)
Eos: 1 %
Hematocrit: 44.2 % (ref 34.0–46.6)
Hemoglobin: 15.2 g/dL (ref 11.1–15.9)
Immature Grans (Abs): 0 10*3/uL (ref 0.0–0.1)
Immature Granulocytes: 0 %
Lymphocytes Absolute: 2.5 10*3/uL (ref 0.7–3.1)
Lymphs: 25 %
MCH: 33 pg (ref 26.6–33.0)
MCHC: 34.4 g/dL (ref 31.5–35.7)
MCV: 96 fL (ref 79–97)
Monocytes Absolute: 0.9 10*3/uL (ref 0.1–0.9)
Monocytes: 9 %
Neutrophils Absolute: 6.5 10*3/uL (ref 1.4–7.0)
Neutrophils: 64 %
Platelets: 347 10*3/uL (ref 150–450)
RBC: 4.6 x10E6/uL (ref 3.77–5.28)
RDW: 11.4 % — ABNORMAL LOW (ref 11.7–15.4)
WBC: 10.1 10*3/uL (ref 3.4–10.8)

## 2021-11-04 LAB — LIPID PANEL WITH LDL/HDL RATIO
Cholesterol, Total: 138 mg/dL (ref 100–199)
HDL: 55 mg/dL (ref >39)
LDL Chol Calc (NIH): 63 mg/dL (ref 0–99)
LDL/HDL Ratio: 1.1 ratio (ref 0.0–3.2)
Triglycerides: 109 mg/dL (ref 0–149)
VLDL Cholesterol Cal: 20 mg/dL (ref 5–40)

## 2021-11-04 LAB — HEMOGLOBIN A1C
Est. average glucose Bld gHb Est-mCnc: 140 mg/dL
Hgb A1c MFr Bld: 6.5 % — ABNORMAL HIGH (ref 4.8–5.6)

## 2021-11-04 NOTE — Progress Notes (Signed)
Labs look great. I think I reviewed these with her yesterday.

## 2021-11-09 ENCOUNTER — Other Ambulatory Visit: Payer: Self-pay | Admitting: Nurse Practitioner

## 2021-11-09 ENCOUNTER — Telehealth: Payer: Self-pay | Admitting: Nurse Practitioner

## 2021-11-09 DIAGNOSIS — E119 Type 2 diabetes mellitus without complications: Secondary | ICD-10-CM

## 2021-11-09 DIAGNOSIS — J449 Chronic obstructive pulmonary disease, unspecified: Secondary | ICD-10-CM

## 2021-11-09 MED ORDER — TRULICITY 0.75 MG/0.5ML ~~LOC~~ SOAJ: 0.7500 mg | SUBCUTANEOUS | 0 refills | Status: DC

## 2021-11-09 MED ORDER — TRULICITY 1.5 MG/0.5ML ~~LOC~~ SOAJ: 1.5000 mg | SUBCUTANEOUS | 1 refills | Status: DC

## 2021-11-09 NOTE — Telephone Encounter (Signed)
I sent in Trulicity. She may need a PA for this, but at [place] it should be $4

## 2021-11-09 NOTE — Telephone Encounter (Signed)
Pt called in regard to Semaglutide,0.25 or 0.5MG /DOS, (OZEMPIC, 0.25 OR 0.5 MG/DOSE,) 2 MG/1.5ML SOPN Pt states that insurance has denied the medication     Pt also needs refill on  albuterol (PROAIR HFA) 108 (90 Base) MCG/ACT inhaler

## 2021-11-09 NOTE — Telephone Encounter (Signed)
LMTRC

## 2021-11-09 NOTE — Telephone Encounter (Signed)
Patient aware.

## 2021-11-21 ENCOUNTER — Other Ambulatory Visit (HOSPITAL_COMMUNITY): Payer: Self-pay

## 2021-11-21 ENCOUNTER — Other Ambulatory Visit (HOSPITAL_COMMUNITY): Payer: Self-pay | Admitting: General Practice

## 2021-11-21 ENCOUNTER — Other Ambulatory Visit: Payer: Self-pay | Admitting: Orthopedic Surgery

## 2021-11-21 DIAGNOSIS — Z1231 Encounter for screening mammogram for malignant neoplasm of breast: Secondary | ICD-10-CM

## 2021-12-02 ENCOUNTER — Ambulatory Visit (INDEPENDENT_AMBULATORY_CARE_PROVIDER_SITE_OTHER): Payer: Medicaid Other | Admitting: Orthopedic Surgery

## 2021-12-02 ENCOUNTER — Encounter: Payer: Self-pay | Admitting: Orthopedic Surgery

## 2021-12-02 ENCOUNTER — Other Ambulatory Visit: Payer: Self-pay

## 2021-12-02 VITALS — Ht 65.0 in | Wt 225.0 lb

## 2021-12-02 DIAGNOSIS — S46011D Strain of muscle(s) and tendon(s) of the rotator cuff of right shoulder, subsequent encounter: Secondary | ICD-10-CM

## 2021-12-02 NOTE — Progress Notes (Signed)
Orthopaedic Clinic Return  Assessment: Michaela Aguilar is a 56 y.o. female with the following: Right shoulder rotator cuff tear, supraspinatus with retraction to the mid humeral head  Plan: Pain and motion improved since the last visit.  She is interested in a repeat injection.  She states the flexeril is helpful for night pain, refill provided.  Follow up as needed.    Discussed proceeding with surgery.  She is aware that it is possible that her injury may be irreparable, or continue to worsen.  She states her understanding.  All questions were answered.   Procedure note injection - Right shoulder    Verbal consent was obtained to inject the right shoulder, subacromial space Timeout was completed to confirm the site of injection.   The skin was prepped with alcohol and ethyl chloride was sprayed at the injection site.  A 21-gauge needle was used to inject 40 mg of Depo-Medrol and 1% lidocaine (3 cc) into the subacromial space of the right shoulder using a posterolateral approach.  There were no complications.  A sterile bandage was applied.    Follow-up: Return if symptoms worsen or fail to improve.   Subjective:  Chief Complaint  Patient presents with   Shoulder Pain    Rt shoulder wants injections .    History of Present Illness: Michaela DEARCOS is a 56 y.o. female who returns to clinic for repeat evaluation of her right shoulder.  Injection was effective.  He continues to provide relief until approximately 7-8 days ago.  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  Flexeril helps her sleep at night.  Pain gets worse at night.   Review of Systems: No fevers or chills No numbness or tingling No chest pain No shortness of breath No bowel or bladder dysfunction No GI distress No headaches   Objective: Ht 5\' 5"  (1.651 m)    Wt 225 lb (102.1 kg)    LMP 02/16/2014    BMI 37.44 kg/m   Physical Exam:  Alert  and oriented.  No acute distress.  Evaluation right shoulder demonstrates no deformity.  She is able to achieve forward flexion of approximately 160 degrees, with some discomfort.  pain in the empty can testing position.  Negative belly press.  Fingers warm and well-perfused.  IMAGING: I personally ordered and reviewed the following images:  Right shoulder MRI  IMPRESSION: 1. The study is moderately motion degraded, limiting intra-articular detail. 2. There is evidence of a full-thickness, moderately retracted supraspinatus tendon tear. The infraspinatus tendon appears partially torn. Mild associated muscular atrophy. 3. The biceps tendon appears intact within the bicipital groove, although its intra-articular portion is not well visualized. 4. No evidence of labral tear or acute osseous findings.  Mordecai Rasmussen, MD 12/02/2021 10:38 AM

## 2021-12-02 NOTE — Patient Instructions (Signed)

## 2021-12-08 ENCOUNTER — Other Ambulatory Visit: Payer: Self-pay | Admitting: Orthopedic Surgery

## 2021-12-08 ENCOUNTER — Encounter: Payer: Self-pay | Admitting: Obstetrics & Gynecology

## 2021-12-08 ENCOUNTER — Other Ambulatory Visit: Payer: Self-pay

## 2021-12-08 ENCOUNTER — Encounter: Payer: Self-pay | Admitting: Nurse Practitioner

## 2021-12-08 ENCOUNTER — Other Ambulatory Visit (HOSPITAL_COMMUNITY)
Admission: RE | Admit: 2021-12-08 | Discharge: 2021-12-08 | Disposition: A | Discharge status: Home / Self Care | Payer: Medicaid Other | Source: Ambulatory Visit | Attending: Obstetrics & Gynecology | Admitting: Obstetrics & Gynecology

## 2021-12-08 ENCOUNTER — Ambulatory Visit (INDEPENDENT_AMBULATORY_CARE_PROVIDER_SITE_OTHER): Payer: Medicaid Other | Admitting: Obstetrics & Gynecology

## 2021-12-08 VITALS — BP 114/79 | HR 79 | Ht 65.0 in | Wt 227.2 lb

## 2021-12-08 DIAGNOSIS — Z1231 Encounter for screening mammogram for malignant neoplasm of breast: Secondary | ICD-10-CM | POA: Diagnosis not present

## 2021-12-08 DIAGNOSIS — Z0000 Encounter for general adult medical examination without abnormal findings: Secondary | ICD-10-CM | POA: Insufficient documentation

## 2021-12-08 DIAGNOSIS — Z01419 Encounter for gynecological examination (general) (routine) without abnormal findings: Secondary | ICD-10-CM

## 2021-12-08 LAB — COLOGUARD: COLOGUARD: POSITIVE — AB

## 2021-12-08 NOTE — Progress Notes (Signed)
° °  WELL-WOMAN EXAMINATION Patient name: Michaela Aguilar MRN [id number]  Date of birth: 06-Apr-1966 Chief Complaint:   Gynecologic Exam  History of Present Illness:   Michaela Aguilar is a 56 y.o. G2P2 PM female being seen today for a routine well-woman exam.  Today she notes: no acute complaints or concerns   Patient's last menstrual period was 02/16/2014.  Last pap several years ago.  Last mammogram: 11/2020. Last colonoscopy: Cologuard completed  Depression screen [facility] 2/9 12/08/2021 11/03/2021 05/27/2021 04/21/2021 04/13/2021  Decreased Interest 0 0 0 0 0  Down, Depressed, Hopeless 0 0 0 1 0  PHQ - 2 Score 0 0 0 1 0  Altered sleeping 0 - - - -  Tired, decreased energy 1 - - - -  Change in appetite 0 - - - -  Feeling bad or failure about yourself  0 - - - -  Trouble concentrating 1 - - - -  Moving slowly or fidgety/restless 0 - - - -  Suicidal thoughts 0 - - - -  PHQ-9 Score 2 - - - -  Some recent data might be hidden      Review of Systems:   Pertinent items are noted in HPI Denies any headaches, blurred vision, fatigue, shortness of breath, chest pain, abdominal pain, bowel movements, urination, or intercourse unless otherwise stated above.  Pertinent History Reviewed:  Reviewed past medical,surgical, social and family history.  Reviewed problem list, medications and allergies. Physical Assessment:   Vitals:   12/08/21 1031  BP: 114/79  Pulse: 79  Weight: 227 lb 3.2 oz (103.1 kg)  Height: 5\' 5"  (1.651 m)  Body mass index is 37.81 kg/m.        Physical Examination:   General appearance - well appearing, and in no distress  Mental status - alert, oriented to person, place, and time  Psych:  She has a normal mood and affect  Skin - warm and dry, normal color, no suspicious lesions noted  Chest - effort normal, all lung fields clear to auscultation bilaterally  Heart - normal rate and regular rhythm  Neck:  midline trachea, no thyromegaly or nodules  Breasts - breasts appear  normal, no suspicious masses, no skin or nipple changes or  axillary nodes  Abdomen - soft, nontender, nondistended, no masses or organomegaly  Pelvic - VULVA: normal appearing vulva with no masses, tenderness or lesions  VAGINA: normal appearing vagina with normal color and discharge, no lesions  CERVIX: normal appearing cervix without discharge or lesions, no CMT  Thin prep pap is done with HR HPV cotesting  UTERUS: uterus is felt to be normal size, shape, consistency and nontender   ADNEXA: No adnexal masses or tenderness noted.  Extremities:  No swelling or varicosities noted  Chaperone: Levy Aguilar     Assessment & Plan:  1) Well-Woman Exam -pap collected reviewed screening guidelines -mammogram ordered   Orders Placed This Encounter  Procedures   MM 3D SCREEN BREAST BILATERAL    Meds: No orders of the defined types were placed in this encounter.   Follow-up: Return in about 1 year (around 12/08/2022) for Annual.   Michaela Pupa, DO Attending [place], [place] for [facility], [place]

## 2021-12-09 ENCOUNTER — Telehealth: Payer: Self-pay | Admitting: Obstetrics & Gynecology

## 2021-12-09 ENCOUNTER — Telehealth: Payer: Self-pay

## 2021-12-09 ENCOUNTER — Other Ambulatory Visit: Payer: Self-pay | Admitting: Obstetrics & Gynecology

## 2021-12-09 ENCOUNTER — Other Ambulatory Visit: Payer: Self-pay | Admitting: Nurse Practitioner

## 2021-12-09 DIAGNOSIS — R195 Other fecal abnormalities: Secondary | ICD-10-CM

## 2021-12-09 NOTE — Telephone Encounter (Signed)
Patient says she needs a referral to a GI doctor for colonoscopy. She wants to go to [place].

## 2021-12-09 NOTE — Telephone Encounter (Signed)
Spoke with pt she states verbal understanding

## 2021-12-09 NOTE — Telephone Encounter (Signed)
Patient called stating that she did a cologaurd test for her colon test, but it came out positive and she would like a referral sent to the gastro doctor so they can preform the other colonoscopy. Please contact pt when done.

## 2021-12-09 NOTE — Telephone Encounter (Signed)
Returned pt's call after discussing with Dr Nelda Marseille, two identifiers used. Informed pt that she can call [facility] to schedule a screening colonscopy without a referral and have the results sent to her PCP. Pt confirmed understanding.

## 2021-12-09 NOTE — Telephone Encounter (Signed)
Patient called she is following up on cologuard positive results. Contact patient [phone]

## 2021-12-09 NOTE — Telephone Encounter (Signed)
Referral created for GI.  Sending to you in case she calls back

## 2021-12-09 NOTE — Progress Notes (Signed)
Referral to GI  

## 2021-12-09 NOTE — Telephone Encounter (Signed)
Spoke with pt

## 2021-12-09 NOTE — Telephone Encounter (Signed)
Patient has been referred for colonoscopy. Positive Cologuard test mean that pt might  have colon cancer, colonoscopy will be more definite,

## 2021-12-12 LAB — CYTOLOGY - PAP
Adequacy: ABSENT
Comment: NEGATIVE
Diagnosis: NEGATIVE
High risk HPV: NEGATIVE

## 2021-12-14 ENCOUNTER — Ambulatory Visit (HOSPITAL_COMMUNITY): Payer: Medicaid Other

## 2021-12-15 ENCOUNTER — Encounter: Payer: Self-pay | Admitting: Internal Medicine

## 2021-12-16 ENCOUNTER — Encounter (HOSPITAL_COMMUNITY): Payer: Self-pay

## 2021-12-16 ENCOUNTER — Ambulatory Visit (HOSPITAL_COMMUNITY): Payer: Medicaid Other

## 2021-12-19 ENCOUNTER — Other Ambulatory Visit: Payer: Self-pay | Admitting: Family Medicine

## 2021-12-19 ENCOUNTER — Other Ambulatory Visit: Payer: Self-pay

## 2021-12-19 ENCOUNTER — Telehealth: Payer: Self-pay | Admitting: Nurse Practitioner

## 2021-12-19 DIAGNOSIS — J449 Chronic obstructive pulmonary disease, unspecified: Secondary | ICD-10-CM

## 2021-12-19 MED ORDER — ALBUTEROL SULFATE HFA 108 (90 BASE) MCG/ACT IN AERS: INHALATION_SPRAY | RESPIRATORY_TRACT | 0 refills | Status: DC

## 2021-12-19 NOTE — Telephone Encounter (Signed)
Please send in a refill for Ventolin to [facility]

## 2021-12-19 NOTE — Telephone Encounter (Signed)
Refill sent.

## 2021-12-26 ENCOUNTER — Other Ambulatory Visit: Payer: Self-pay | Admitting: Orthopedic Surgery

## 2021-12-29 ENCOUNTER — Ambulatory Visit (HOSPITAL_COMMUNITY)
Admission: RE | Admit: 2021-12-29 | Discharge: 2021-12-29 | Disposition: A | Discharge status: Home / Self Care | Payer: Medicaid Other | Source: Ambulatory Visit | Attending: Obstetrics & Gynecology | Admitting: Obstetrics & Gynecology

## 2021-12-29 ENCOUNTER — Other Ambulatory Visit: Payer: Self-pay

## 2021-12-29 DIAGNOSIS — Z1231 Encounter for screening mammogram for malignant neoplasm of breast: Secondary | ICD-10-CM | POA: Insufficient documentation

## 2022-01-03 ENCOUNTER — Encounter: Payer: Self-pay | Admitting: Gastroenterology

## 2022-01-18 ENCOUNTER — Other Ambulatory Visit: Payer: Self-pay | Admitting: Orthopedic Surgery

## 2022-01-24 NOTE — Progress Notes (Signed)
? ?Referring Provider: Dr. Joseph Gray ?Primary Care Physician:  Carver, Charles K, DO ?Primary Gastroenterologist:  Dr. Carver ? ?Chief Complaint  ?Patient presents with  ? Colonoscopy  ?  Positive cologuard  ? ? ?HPI:   ?Michaela Aguilar is a 56 y.o. female presenting today at the request of Dr. Joseph Gray for positive Cologuard. ? ?Cologuard was positive on 11/30/2021.  Reviewed labs from December.  Hemoglobin 15.2.  Slight elevation of ALT at 47 which is a chronic finding.  Hemoglobin A1c 6.5. ? ?Prior CT renal study in 2019 with no liver abnormalities. ? ?Today:  ?Reports she had a single episode of toilet tissue hematochezia after completing Cologuard in January 2023.  This was in the setting of constipation and having to strain to have a bowel movement.  She does not usually struggle with constipation and has not had any trouble since.  Denies melena, abdominal pain, unintentional weight loss.  She has never had a colonoscopy.  No family history of colon cancer. ? ?Occasional nausea since starting Trulicity. No vomiting. No GERD symptoms or dysphagia. ? ?Elevated ALT:  ?Rare tylenol.  ?Drinks green tea.  No herbal teas or supplements. ?Occasional alcohol. Once a month or less. Usually on special occasions. Couple of mixed drinks. No history of heavy alcohol use.  ?No Hx of drug use.  ?No Fhx of liver disease.  ? ?Denies abdominal distention, lower extremity edema, yellowing of the eyes or skin, bruising/bleeding, or mental status changes. ? ?Past Medical History:  ?Diagnosis Date  ? Anxiety   ? Bronchitis   ? Chronic pain of left knee 04/03/2017  ? Chronic pain of right knee 08/16/2017  ? Depression   ? Encounter for screening for malignant neoplasm of cervix 11/08/2019  ? Encounter for screening for malignant neoplasm of colon 11/08/2019  ? Encounter for screening mammogram for malignant neoplasm of breast 11/08/2019  ? Gestational diabetes   ? 1992  ? History of kidney stones   ? Kidney stones per pt.  ? Low  back pain 11/17/2015  ? Neck pain 11/17/2015  ? ? ?Past Surgical History:  ?Procedure Laterality Date  ? CESAREAN SECTION    ? x2  ? CYSTOSCOPY WITH RETROGRADE PYELOGRAM, URETEROSCOPY AND STENT PLACEMENT Right 08/18/2019  ? Procedure: CYSTOSCOPY WITH RETROGRADE PYELOGRAM, URETEROSCOPY AND STENT PLACEMENT;  Surgeon: McKenzie, Patrick L, MD;  Location: [facility];  Service: Urology;  Laterality: Right;  ? CYSTOSCOPY WITH STENT PLACEMENT Right 09/06/2016  ? Procedure: CYSTOSCOPY WITH RIGHT URETERAL STENT PLACEMENT;  Surgeon: Patrick L McKenzie, MD;  Location: [facility];  Service: Urology;  Laterality: Right;  ? CYSTOSCOPY/RETROGRADE/URETEROSCOPY/STONE EXTRACTION WITH BASKET Right 09/06/2016  ? Procedure: CYSTOSCOPY/RIGHT RETROGRADE PYELOGRAM, RIGHT RENAL STONE EXTRACTION WITH LASER;  Surgeon: Patrick L McKenzie, MD;  Location: [facility];  Service: Urology;  Laterality: Right;  ? EXTRACORPOREAL SHOCK WAVE LITHOTRIPSY  1995 2010  ? HERNIA REPAIR    ? incisional hernia  ? STONE EXTRACTION WITH BASKET Right 08/18/2019  ? Procedure: STONE EXTRACTION WITH BASKET;  Surgeon: McKenzie, Patrick L, MD;  Location: [facility];  Service: Urology;  Laterality: Right;  ? ? ?Current Outpatient Medications  ?Medication Sig Dispense Refill  ? Acetaminophen (TYLENOL PO) Take by mouth as needed.    ? albuterol (VENTOLIN HFA) 108 (90 Base) MCG/ACT inhaler INHALE 2 PUFFS INTO LUNGS EVERY 6 HOURS AS NEEDED. 18 g 0  ? atorvastatin (LIPITOR) 40 MG tablet Take 1 tablet (40 mg total) by mouth daily. 90 tablet 3  ?   blood glucose meter kit and supplies Dispense based on patient and insurance preference. Use up to four times daily as directed. (FOR ICD-10 E10.9, E11.9). 1 each 0  ? cyclobenzaprine (FLEXERIL) 10 MG tablet TAKE (1) TABLET BY MOUTH TWICE DAILY AS NEEDED FOR MUSCLE SPASMS. 20 tablet 0  ? Dulaglutide (TRULICITY) 1.5 WN/0.2VO SOPN Inject 1.5 mg into the skin once a week. 6 mL 1  ? Insulin Pen Needle (PEN NEEDLES) 32G X 5 MM MISC Use to inject ozempic  weekly 50 each 0  ? lisinopril (ZESTRIL) 10 MG tablet Take 1 tablet (10 mg total) by mouth daily. 90 tablet 3  ? Multiple Vitamins-Minerals (MULTIVITAMIN WITH MINERALS) tablet Take 1 tablet by mouth daily.    ? Omega-3 Fatty Acids (FISH OIL) 1000 MG CAPS Take 1 capsule by mouth in the morning and at bedtime.    ? Vitamin D, Ergocalciferol, (DRISDOL) 1.25 MG (50000 UNIT) CAPS capsule TAKE 1 TABLET WEEKLY. (SAME DAY EACH WEEK) 12 capsule 0  ? famotidine (PEPCID) 20 MG tablet Take 1 tablet (20 mg total) by mouth 2 (two) times daily. (Patient not taking: Reported on 01/25/2022) 30 tablet 1  ? ?No current facility-administered medications for this visit.  ? ? ?Allergies as of 01/25/2022  ? (No Known Allergies)  ? ? ?Family History  ?Problem Relation Age of Onset  ? Osteoporosis Mother   ? Depression Father   ? Mental illness Father   ? Emphysema Maternal Grandmother   ? Cancer Maternal Grandfather   ?     lung cancer  ? Cancer Maternal Uncle   ?     lung cancer  ? COPD Maternal Uncle   ? Depression Maternal Uncle   ? Breast cancer Neg Hx   ? Colon cancer Neg Hx   ? ? ?Social History  ? ?Socioeconomic History  ? Marital status: Divorced  ?  Spouse name: Not on file  ? Number of children: 2  ? Years of education: Not on file  ? Highest education level: Some college, no degree  ?Occupational History  ? Not on file  ?Tobacco Use  ? Smoking status: Every Day  ?  Packs/day: 0.50  ?  Years: 15.00  ?  Pack years: 7.50  ?  Types: Cigarettes  ? Smokeless tobacco: Never  ?Vaping Use  ? Vaping Use: Never used  ?Substance and Sexual Activity  ? Alcohol use: Yes  ?  Comment: occ  ? Drug use: No  ?  Types: Marijuana  ?  Comment: last marijuana use early October 2017  ? Sexual activity: Yes  ?  Birth control/protection: Post-menopausal  ?Other Topics Concern  ? Not on file  ?Social History Narrative  ? Lives with roommate  ? 2 children (grown)  ? 1 daughter no grandbabies   ? 1 son-2 grandbabies 1 month and 36 year old (daughters)  ? 2  cats: Bella and Tip   ?   ? Enjoys: swim and ride bike enjoys being outside   ?   ? Diet:eats all food groups, enjoys bread-air frying more, needs more veggies   ? Caffeine: soda-sugar free- one daily,   ? Water: 3-4 cups daily   ?   ? Wears seat belt  ? Smoke detectors   ? Does not use phone while driving   ? ?Social Determinants of Health  ? ?Financial Resource Strain: Low Risk   ? Difficulty of Paying Living Expenses: Not very hard  ?Food Insecurity: Food Insecurity Present  ?  Worried About Charity fundraiser in the Last Year: Sometimes true  ? Ran Out of Food in the Last Year: Never true  ?Transportation Needs: No Transportation Needs  ? Lack of Transportation (Medical): No  ? Lack of Transportation (Non-Medical): No  ?Physical Activity: Sufficiently Active  ? Days of Exercise per Week: 3 days  ? Minutes of Exercise per Session: 50 min  ?Stress: No Stress Concern Present  ? Feeling of Stress : Not at all  ?Social Connections: Moderately Integrated  ? Frequency of Communication with Friends and Family: More than three times a week  ? Frequency of Social Gatherings with Friends and Family: Twice a week  ? Attends Religious Services: More than 4 times per year  ? Active Member of Clubs or Organizations: Yes  ? Attends Archivist Meetings: Never  ? Marital Status: Divorced  ?Intimate Partner Violence: Not At Risk  ? Fear of Current or Ex-Partner: No  ? Emotionally Abused: No  ? Physically Abused: No  ? Sexually Abused: No  ? ? ?Review of Systems: ?Gen: Denies any fever, chills cold-like symptoms, presyncope, syncope. ?CV: Denies chest pain, heart palpitations. ?Resp: Denies shortness of breath at rest or cough. ?GI: See HPI ?GU : Denies urinary burning, urinary frequency, urinary hesitancy ?MS: Denies joint pain. ?Derm: Denies rash. ?Psych: Denies depression, anxiety. ?Heme: See HPI ? ?Physical Exam: ?BP 132/72   Pulse (!) 108   Temp (!) 97.4 ?F (36.3 ?C) (Temporal)   Ht _0  (1.651 m)   Wt 228 lb  9.6 oz (103.7 kg)   LMP 02/16/2014   BMI 38.04 kg/m?  ?General:   Alert and oriented. Pleasant and cooperative. Well-nourished and well-developed.  ?Head:  Normocephalic and atraumatic. ?Eyes:  Without icterus, s

## 2022-01-24 NOTE — H&P (View-Only) (Signed)
? ?Referring Provider: Dr. Demetrius Revel ?Primary Care Physician:  [name], DO ?Primary Gastroenterologist:  Dr. Abbey Chatters ? ?Chief Complaint  ?Patient presents with  ? Colonoscopy  ?  Positive cologuard  ? ? ?HPI:   ?Michaela Aguilar is a 56 y.o. female presenting today at the request of Dr. Demetrius Revel for positive Cologuard. ? ?Cologuard was positive on 11/30/2021.  Reviewed labs from December.  Hemoglobin 15.2.  Slight elevation of ALT at 47 which is a chronic finding.  Hemoglobin A1c 6.5. ? ?Prior CT renal study in 2019 with no liver abnormalities. ? ?Today:  ?Reports she had a single episode of toilet tissue hematochezia after completing Cologuard in January 2023.  This was in the setting of constipation and having to strain to have a bowel movement.  She does not usually struggle with constipation and has not had any trouble since.  Denies melena, abdominal pain, unintentional weight loss.  She has never had a colonoscopy.  No family history of colon cancer. ? ?Occasional nausea since starting Trulicity. No vomiting. No GERD symptoms or dysphagia. ? ?Elevated ALT:  ?Rare tylenol.  ?Drinks green tea.  No herbal teas or supplements. ?Occasional alcohol. Once a month or less. Usually on special occasions. Couple of mixed drinks. No history of heavy alcohol use.  ?No Hx of drug use.  ?No Fhx of liver disease.  ? ?Denies abdominal distention, lower extremity edema, yellowing of the eyes or skin, bruising/bleeding, or mental status changes. ? ?Past Medical History:  ?Diagnosis Date  ? Anxiety   ? Bronchitis   ? Chronic pain of left knee 04/03/2017  ? Chronic pain of right knee 08/16/2017  ? Depression   ? Encounter for screening for malignant neoplasm of cervix 11/08/2019  ? Encounter for screening for malignant neoplasm of colon 11/08/2019  ? Encounter for screening mammogram for malignant neoplasm of breast 11/08/2019  ? Gestational diabetes   ? 1992  ? History of kidney stones   ? Kidney stones per pt.  ? Low  back pain 11/17/2015  ? Neck pain 11/17/2015  ? ? ?Past Surgical History:  ?Procedure Laterality Date  ? CESAREAN SECTION    ? x2  ? CYSTOSCOPY WITH RETROGRADE PYELOGRAM, URETEROSCOPY AND STENT PLACEMENT Right 08/18/2019  ? Procedure: CYSTOSCOPY WITH RETROGRADE PYELOGRAM, URETEROSCOPY AND STENT PLACEMENT;  Surgeon: Cleon Gustin, MD;  Location: [facility];  Service: Urology;  Laterality: Right;  ? CYSTOSCOPY WITH STENT PLACEMENT Right 09/06/2016  ? Procedure: CYSTOSCOPY WITH RIGHT URETERAL STENT PLACEMENT;  Surgeon: Cleon Gustin, MD;  Location: [facility];  Service: Urology;  Laterality: Right;  ? CYSTOSCOPY/RETROGRADE/URETEROSCOPY/STONE EXTRACTION WITH BASKET Right 09/06/2016  ? Procedure: CYSTOSCOPY/RIGHT RETROGRADE PYELOGRAM, RIGHT RENAL STONE EXTRACTION WITH LASER;  Surgeon: Cleon Gustin, MD;  Location: [facility];  Service: Urology;  Laterality: Right;  ? [place] 2010  ? HERNIA REPAIR    ? incisional hernia  ? STONE EXTRACTION WITH BASKET Right 08/18/2019  ? Procedure: STONE EXTRACTION WITH BASKET;  Surgeon: Cleon Gustin, MD;  Location: [facility];  Service: Urology;  Laterality: Right;  ? ? ?Current Outpatient Medications  ?Medication Sig Dispense Refill  ? Acetaminophen (TYLENOL PO) Take by mouth as needed.    ? albuterol (VENTOLIN HFA) 108 (90 Base) MCG/ACT inhaler INHALE 2 PUFFS INTO LUNGS EVERY 6 HOURS AS NEEDED. 18 g 0  ? atorvastatin (LIPITOR) 40 MG tablet Take 1 tablet (40 mg total) by mouth daily. 90 tablet 3  ?  blood glucose meter kit and supplies Dispense based on patient and insurance preference. Use up to four times daily as directed. (FOR ICD-10 E10.9, E11.9). 1 each 0  ? cyclobenzaprine (FLEXERIL) 10 MG tablet TAKE (1) TABLET BY MOUTH TWICE DAILY AS NEEDED FOR MUSCLE SPASMS. 20 tablet 0  ? Dulaglutide (TRULICITY) 1.5 WN/0.2VO SOPN Inject 1.5 mg into the skin once a week. 6 mL 1  ? Insulin Pen Needle (PEN NEEDLES) 32G X 5 MM MISC Use to inject ozempic  weekly 50 each 0  ? lisinopril (ZESTRIL) 10 MG tablet Take 1 tablet (10 mg total) by mouth daily. 90 tablet 3  ? Multiple Vitamins-Minerals (MULTIVITAMIN WITH MINERALS) tablet Take 1 tablet by mouth daily.    ? Omega-3 Fatty Acids (FISH OIL) 1000 MG CAPS Take 1 capsule by mouth in the morning and at bedtime.    ? Vitamin D, Ergocalciferol, (DRISDOL) 1.25 MG (50000 UNIT) CAPS capsule TAKE 1 TABLET WEEKLY. (SAME DAY EACH WEEK) 12 capsule 0  ? famotidine (PEPCID) 20 MG tablet Take 1 tablet (20 mg total) by mouth 2 (two) times daily. (Patient not taking: Reported on 01/25/2022) 30 tablet 1  ? ?No current facility-administered medications for this visit.  ? ? ?Allergies as of 01/25/2022  ? (No Known Allergies)  ? ? ?Family History  ?Problem Relation Age of Onset  ? Osteoporosis Mother   ? Depression Father   ? Mental illness Father   ? Emphysema Maternal Grandmother   ? Cancer Maternal Grandfather   ?     lung cancer  ? Cancer Maternal Uncle   ?     lung cancer  ? COPD Maternal Uncle   ? Depression Maternal Uncle   ? Breast cancer Neg Hx   ? Colon cancer Neg Hx   ? ? ?Social History  ? ?Socioeconomic History  ? Marital status: Divorced  ?  Spouse name: Not on file  ? Number of children: 2  ? Years of education: Not on file  ? Highest education level: Some college, no degree  ?Occupational History  ? Not on file  ?Tobacco Use  ? Smoking status: Every Day  ?  Packs/day: 0.50  ?  Years: 15.00  ?  Pack years: 7.50  ?  Types: Cigarettes  ? Smokeless tobacco: Never  ?Vaping Use  ? Vaping Use: Never used  ?Substance and Sexual Activity  ? Alcohol use: Yes  ?  Comment: occ  ? Drug use: No  ?  Types: Marijuana  ?  Comment: last marijuana use early October 2017  ? Sexual activity: Yes  ?  Birth control/protection: Post-menopausal  ?Other Topics Concern  ? Not on file  ?Social History Narrative  ? Lives with roommate  ? 2 children (grown)  ? 1 daughter no grandbabies   ? 1 son-2 grandbabies 1 month and 36 year old (daughters)  ? 2  cats: Bella and Tip   ?   ? Enjoys: swim and ride bike enjoys being outside   ?   ? Diet:eats all food groups, enjoys bread-air frying more, needs more veggies   ? Caffeine: soda-sugar free- one daily,   ? Water: 3-4 cups daily   ?   ? Wears seat belt  ? Smoke detectors   ? Does not use phone while driving   ? ?Social Determinants of Health  ? ?Financial Resource Strain: Low Risk   ? Difficulty of Paying Living Expenses: Not very hard  ?Food Insecurity: Food Insecurity Present  ?  Worried About Charity fundraiser in the Last Year: Sometimes true  ? Ran Out of Food in the Last Year: Never true  ?Transportation Needs: No Transportation Needs  ? Lack of Transportation (Medical): No  ? Lack of Transportation (Non-Medical): No  ?Physical Activity: Sufficiently Active  ? Days of Exercise per Week: 3 days  ? Minutes of Exercise per Session: 50 min  ?Stress: No Stress Concern Present  ? Feeling of Stress : Not at all  ?Social Connections: Moderately Integrated  ? Frequency of Communication with Friends and Family: More than three times a week  ? Frequency of Social Gatherings with Friends and Family: Twice a week  ? Attends Religious Services: More than 4 times per year  ? Active Member of Clubs or Organizations: Yes  ? Attends Archivist Meetings: Never  ? Marital Status: Divorced  ?Intimate Partner Violence: Not At Risk  ? Fear of Current or Ex-Partner: No  ? Emotionally Abused: No  ? Physically Abused: No  ? Sexually Abused: No  ? ? ?Review of Systems: ?Gen: Denies any fever, chills cold-like symptoms, presyncope, syncope. ?CV: Denies chest pain, heart palpitations. ?Resp: Denies shortness of breath at rest or cough. ?GI: See HPI ?GU : Denies urinary burning, urinary frequency, urinary hesitancy ?MS: Denies joint pain. ?Derm: Denies rash. ?Psych: Denies depression, anxiety. ?Heme: See HPI ? ?Physical Exam: ?BP 132/72   Pulse (!) 108   Temp (!) 97.4 ?F (36.3 ?C) (Temporal)   Ht _0  (1.651 m)   Wt 228 lb  9.6 oz (103.7 kg)   LMP 02/16/2014   BMI 38.04 kg/m?  ?General:   Alert and oriented. Pleasant and cooperative. Well-nourished and well-developed.  ?Head:  Normocephalic and atraumatic. ?Eyes:  Without icterus, s

## 2022-01-25 ENCOUNTER — Encounter: Payer: Self-pay | Admitting: Gastroenterology

## 2022-01-25 ENCOUNTER — Other Ambulatory Visit: Payer: Self-pay

## 2022-01-25 ENCOUNTER — Ambulatory Visit: Payer: Medicaid Other | Admitting: Gastroenterology

## 2022-01-25 VITALS — BP 132/72 | HR 108 | Temp 97.4°F | Ht 65.0 in | Wt 228.6 lb

## 2022-01-25 DIAGNOSIS — E669 Obesity, unspecified: Secondary | ICD-10-CM

## 2022-01-25 DIAGNOSIS — R195 Other fecal abnormalities: Secondary | ICD-10-CM | POA: Insufficient documentation

## 2022-01-25 DIAGNOSIS — Z6838 Body mass index (BMI) 38.0-38.9, adult: Secondary | ICD-10-CM | POA: Diagnosis not present

## 2022-01-25 DIAGNOSIS — R7401 Elevation of levels of liver transaminase levels: Secondary | ICD-10-CM | POA: Diagnosis not present

## 2022-01-25 DIAGNOSIS — E66812 Obesity, class 2: Secondary | ICD-10-CM

## 2022-01-25 NOTE — Patient Instructions (Signed)
Please have blood work completed at [facility] ? ?We will arrange for you to have an ultrasound at [facility]. ? ?We will also arrange for you to have a colonoscopy with Dr. Abbey Chatters in the near future. ? ?Due to elevated liver enzyme: ?Recommend avoiding alcohol for now. ?Avoid Tylenol. ?Avoid all over-the-counter supplements and herbal teas. ?As we discussed, it is important to keep diabetes under good control. ? ?Recommend 1-2# weight loss per week until ideal body weight through exercise & diet. ?Low fat/cholesterol diet.   ?Avoid sweets, sodas, fruit juices, sweetened beverages like tea, etc. ?Gradually increase exercise from 15 min daily up to 1 hr per day 5 days/week. ? ?We will refer you to healthy weight and wellness to help with your weight loss.  ? ?We will follow-up with you after your colonoscopy.  ? ?It was a pleasure meeting you today!  ? ?Aliene Altes, PA-C ?[facility] ? ? ?

## 2022-01-26 ENCOUNTER — Other Ambulatory Visit: Payer: Self-pay | Admitting: Family Medicine

## 2022-01-26 ENCOUNTER — Other Ambulatory Visit: Payer: Self-pay | Admitting: Nurse Practitioner

## 2022-01-27 ENCOUNTER — Other Ambulatory Visit: Payer: Self-pay

## 2022-01-27 ENCOUNTER — Telehealth: Payer: Self-pay

## 2022-01-27 MED ORDER — CLENPIQ 10-3.5-12 MG-GM -GM/160ML PO SOLN: 1.0000 | Freq: Once | ORAL | 0 refills | Status: AC

## 2022-01-27 NOTE — Telephone Encounter (Signed)
Called pt, TCS w/Dr. Abbey Chatters ASA 3 scheduled for 02/24/22 at 8:30am. Rx for prep sent to pharmacy. Orders entered. ?

## 2022-01-27 NOTE — Telephone Encounter (Signed)
Pre-op appt 02/21/22. Appt letter mailed with procedure instructions. ?

## 2022-01-30 ENCOUNTER — Ambulatory Visit (HOSPITAL_COMMUNITY): Payer: Medicaid Other

## 2022-01-31 ENCOUNTER — Ambulatory Visit (HOSPITAL_COMMUNITY)
Admission: RE | Admit: 2022-01-31 | Discharge: 2022-01-31 | Disposition: A | Discharge status: Home / Self Care | Payer: Medicaid Other | Source: Ambulatory Visit | Attending: Urology | Admitting: Urology

## 2022-01-31 ENCOUNTER — Other Ambulatory Visit: Payer: Self-pay

## 2022-01-31 DIAGNOSIS — N200 Calculus of kidney: Secondary | ICD-10-CM | POA: Insufficient documentation

## 2022-02-01 ENCOUNTER — Ambulatory Visit: Payer: Medicaid Other | Admitting: Nurse Practitioner

## 2022-02-06 ENCOUNTER — Ambulatory Visit (HOSPITAL_COMMUNITY): Payer: Medicaid Other

## 2022-02-06 ENCOUNTER — Ambulatory Visit: Payer: Medicaid Other | Admitting: Urology

## 2022-02-07 ENCOUNTER — Ambulatory Visit (HOSPITAL_COMMUNITY)
Admission: RE | Admit: 2022-02-07 | Discharge: 2022-02-07 | Disposition: A | Discharge status: Home / Self Care | Payer: Medicaid Other | Source: Ambulatory Visit | Attending: Gastroenterology | Admitting: Gastroenterology

## 2022-02-07 ENCOUNTER — Ambulatory Visit: Payer: Medicaid Other | Admitting: Urology

## 2022-02-07 ENCOUNTER — Other Ambulatory Visit: Payer: Self-pay

## 2022-02-07 ENCOUNTER — Encounter: Payer: Self-pay | Admitting: Urology

## 2022-02-07 VITALS — BP 110/75 | HR 76

## 2022-02-07 DIAGNOSIS — N200 Calculus of kidney: Secondary | ICD-10-CM | POA: Diagnosis not present

## 2022-02-07 DIAGNOSIS — R7401 Elevation of levels of liver transaminase levels: Secondary | ICD-10-CM | POA: Insufficient documentation

## 2022-02-07 NOTE — Patient Instructions (Signed)
Dietary Guidelines to Help Prevent Kidney Stones Kidney stones are deposits of minerals and salts that form inside your kidneys. Your risk of developing kidney stones may be greater depending on your diet, your lifestyle, the medicines you take, and whether you have certain medical conditions. Most people can lower their chances of developing kidney stones by following the instructions below. Your dietitian may give you more specific instructions depending on your overall health and the type of kidney stones you tend to develop. What are tips for following this plan? Reading food labels  Choose foods with "no salt added" or "low-salt" labels. Limit your salt (sodium) intake to less than 1,500 mg a day. Choose foods with calcium for each meal and snack. Try to eat about 300 mg of calcium at each meal. Foods that contain 200-500 mg of calcium a serving include: 8 oz (237 mL) of milk, calcium-fortifiednon-dairy milk, and calcium-fortifiedfruit juice. Calcium-fortified means that calcium has been added to these drinks. 8 oz (237 mL) of kefir, yogurt, and soy yogurt. 4 oz (114 g) of tofu. 1 oz (28 g) of cheese. 1 cup (150 g) of dried figs. 1 cup (91 g) of cooked broccoli. One 3 oz (85 g) can of sardines or mackerel. Most people need 1,000-1,500 mg of calcium a day. Talk to your dietitian about how much calcium is recommended for you. Shopping Buy plenty of fresh fruits and vegetables. Most people do not need to avoid fruits and vegetables, even if these foods contain nutrients that may contribute to kidney stones. When shopping for convenience foods, choose: Whole pieces of fruit. Pre-made salads with dressing on the side. Low-fat fruit and yogurt smoothies. Avoid buying frozen meals or prepared deli foods. These can be high in sodium. Look for foods with live cultures, such as yogurt and kefir. Choose high-fiber grains, such as whole-wheat breads, oat bran, and wheat cereals. Cooking Do not add  salt to food when cooking. Place a salt shaker on the table and allow each person to add his or her own salt to taste. Use vegetable protein, such as beans, textured vegetable protein (TVP), or tofu, instead of meat in pasta, casseroles, and soups. Meal planning Eat less salt, if told by your dietitian. To do this: Avoid eating processed or pre-made food. Avoid eating fast food. Eat less animal protein, including cheese, meat, poultry, or fish, if told by your dietitian. To do this: Limit the number of times you have meat, poultry, fish, or cheese each week. Eat a diet free of meat at least 2 days a week. Eat only one serving each day of meat, poultry, fish, or seafood. When you prepare animal protein, cut pieces into small portion sizes. For most meat and fish, one serving is about the size of the palm of your hand. Eat at least five servings of fresh fruits and vegetables each day. To do this: Keep fruits and vegetables on hand for snacks. Eat one piece of fruit or a handful of berries with breakfast. Have a salad and fruit at lunch. Have two kinds of vegetables at dinner. Limit foods that are high in a substance called oxalate. These include: Spinach (cooked), rhubarb, beets, sweet potatoes, and Swiss chard. Peanuts. Potato chips, french fries, and baked potatoes with skin on. Nuts and nut products. Chocolate. If you regularly take a diuretic medicine, make sure to eat at least 1 or 2 servings of fruits or vegetables that are high in potassium each day. These include: Avocado. Banana. Orange, prune,   carrot, or tomato juice. Baked potato. Cabbage. Beans and split peas. Lifestyle  Drink enough fluid to keep your urine pale yellow. This is the most important thing you can do. Spread your fluid intake throughout the day. If you drink alcohol: Limit how much you use to: 0-1 drink a day for women who are not pregnant. 0-2 drinks a day for men. Be aware of how much alcohol is in your  drink. In the [place] one drink equals one 12 oz bottle of beer (355 mL), one 5 oz glass of wine (148 mL), or one 1 oz glass of hard liquor (44 mL). Lose weight if told by your health care provider. Work with your dietitian to find an eating plan and weight loss strategies that work best for you. General information Talk to your health care provider and dietitian about taking daily supplements. You may be told the following depending on your health and the cause of your kidney stones: Not to take supplements with vitamin C. To take a calcium supplement. To take a daily probiotic supplement. To take other supplements such as magnesium, fish oil, or vitamin B6. Take over-the-counter and prescription medicines only as told by your health care provider. These include supplements. What foods should I limit? Limit your intake of the following foods, or eat them as told by your dietitian. Vegetables Spinach. Rhubarb. Beets. Canned vegetables. Pickles. Olives. Baked potatoes with skin. Grains Wheat bran. Baked goods. Salted crackers. Cereals high in sugar. Meats and other proteins Nuts. Nut butters. Large portions of meat, poultry, or fish. Salted, precooked, or cured meats, such as sausages, meat loaves, and hot dogs. Dairy Cheese. Beverages Regular soft drinks. Regular vegetable juice. Seasonings and condiments Seasoning blends with salt. Salad dressings. Soy sauce. Ketchup. Barbecue sauce. Other foods Canned soups. Canned pasta sauce. Casseroles. Pizza. Lasagna. Frozen meals. Potato chips. French fries. The items listed above may not be a complete list of foods and beverages you should limit. Contact a dietitian for more information. What foods should I avoid? Talk to your dietitian about specific foods you should avoid based on the type of kidney stones you have and your overall health. Fruits Grapefruit. The item listed above may not be a complete list of foods and beverages you should  avoid. Contact a dietitian for more information. Summary Kidney stones are deposits of minerals and salts that form inside your kidneys. You can lower your risk of kidney stones by making changes to your diet. The most important thing you can do is drink enough fluid. Drink enough fluid to keep your urine pale yellow. Talk to your dietitian about how much calcium you should have each day, and eat less salt and animal protein as told by your dietitian. This information is not intended to replace advice given to you by your health care provider. Make sure you discuss any questions you have with your health care provider. Document Revised: 10/23/2019 Document Reviewed: 10/23/2019 Elsevier Patient Education  2022 Elsevier Inc.  

## 2022-02-07 NOTE — Progress Notes (Signed)
? ?02/07/2022 ?9:14 AM  ? ?Michaela Aguilar ?April 29, 1966 ?[id number] ? ?Referring provider: Perlie Mayo, NP ?[address] ?[place],  [place] [address] ? ?Followup nephrolithiasis ? ? ?HPI: ?Michaela Aguilar is a 56yo here for followup for nephrolithiasis. No stone events since last visit. She denies any flank pain. Renal US from 3/21 shows no definitive renal calculi and no hydronephrosis. No other complaints today ? ? ?PMH: ?Past Medical History:  ?Diagnosis Date  ? Anxiety   ? Bronchitis   ? Chronic pain of left knee 04/03/2017  ? Chronic pain of right knee 08/16/2017  ? Depression   ? Encounter for screening for malignant neoplasm of cervix 11/08/2019  ? Encounter for screening for malignant neoplasm of colon 11/08/2019  ? Encounter for screening mammogram for malignant neoplasm of breast 11/08/2019  ? Gestational diabetes   ? 1992  ? History of kidney stones   ? Kidney stones per pt.  ? Low back pain 11/17/2015  ? Neck pain 11/17/2015  ? ? ?Surgical History: ?Past Surgical History:  ?Procedure Laterality Date  ? CESAREAN SECTION    ? x2  ? CYSTOSCOPY WITH RETROGRADE PYELOGRAM, URETEROSCOPY AND STENT PLACEMENT Right 08/18/2019  ? Procedure: CYSTOSCOPY WITH RETROGRADE PYELOGRAM, URETEROSCOPY AND STENT PLACEMENT;  Surgeon: Cleon Gustin, MD;  Location: [facility];  Service: Urology;  Laterality: Right;  ? CYSTOSCOPY WITH STENT PLACEMENT Right 09/06/2016  ? Procedure: CYSTOSCOPY WITH RIGHT URETERAL STENT PLACEMENT;  Surgeon: Cleon Gustin, MD;  Location: [facility];  Service: Urology;  Laterality: Right;  ? CYSTOSCOPY/RETROGRADE/URETEROSCOPY/STONE EXTRACTION WITH BASKET Right 09/06/2016  ? Procedure: CYSTOSCOPY/RIGHT RETROGRADE PYELOGRAM, RIGHT RENAL STONE EXTRACTION WITH LASER;  Surgeon: Cleon Gustin, MD;  Location: [facility];  Service: Urology;  Laterality: Right;  ? [place] 2010  ? HERNIA REPAIR    ? incisional hernia  ? STONE EXTRACTION WITH BASKET Right 08/18/2019  ? Procedure: STONE  EXTRACTION WITH BASKET;  Surgeon: Cleon Gustin, MD;  Location: [facility];  Service: Urology;  Laterality: Right;  ? ? ?Home Medications:  ?Allergies as of 02/07/2022   ?No Known Allergies ?  ? ?  ?Medication List  ?  ? ?  ? Accurate as of February 07, 2022  9:14 AM. If you have any questions, ask your nurse or doctor.  ?  ?  ? ?  ? ?albuterol 108 (90 Base) MCG/ACT inhaler ?Commonly known as: Ventolin HFA ?INHALE 2 PUFFS INTO LUNGS EVERY 6 HOURS AS NEEDED. ?  ?atorvastatin 40 MG tablet ?Commonly known as: LIPITOR ?Take 1 tablet (40 mg total) by mouth daily. ?  ?blood glucose meter kit and supplies ?Dispense based on patient and insurance preference. Use up to four times daily as directed. (FOR ICD-10 E10.9, E11.9). ?  ?Clenpiq 10-3.5-12 MG-GM -GM/160ML Soln ?Generic drug: Sod Picosulfate-Mag Ox-Cit Acd ?Take by mouth. ?  ?cyclobenzaprine 10 MG tablet ?Commonly known as: FLEXERIL ?TAKE (1) TABLET BY MOUTH TWICE DAILY AS NEEDED FOR MUSCLE SPASMS. ?  ?famotidine 20 MG tablet ?Commonly known as: PEPCID ?TAKE (1) TABLET BY MOUTH TWICE DAILY. ?  ?Fish Oil 1000 MG Caps ?Take 1 capsule by mouth in the morning and at bedtime. ?  ?lisinopril 10 MG tablet ?Commonly known as: ZESTRIL ?Take 1 tablet (10 mg total) by mouth daily. ?  ?multivitamin with minerals tablet ?Take 1 tablet by mouth daily. ?  ?Pen Needles 32G X 5 MM Misc ?Use to inject ozempic weekly ?  ?Trulicity 1.5 TA/5.6PV Sopn ?  Generic drug: Dulaglutide ?Inject 1.5 mg into the skin once a week. ?  ?TYLENOL PO ?Take by mouth as needed. ?  ?Vitamin D (Ergocalciferol) 1.25 MG (50000 UNIT) Caps capsule ?Commonly known as: DRISDOL ?TAKE 1 TABLET WEEKLY. (SAME DAY EACH WEEK) ?  ? ?  ? ? ?Allergies: No Known Allergies ? ?Family History: ?Family History  ?Problem Relation Age of Onset  ? Osteoporosis Mother   ? Depression Father   ? Mental illness Father   ? Emphysema Maternal Grandmother   ? Cancer Maternal Grandfather   ?     lung cancer  ? Cancer Maternal Uncle   ?      lung cancer  ? COPD Maternal Uncle   ? Depression Maternal Uncle   ? Breast cancer Neg Hx   ? Colon cancer Neg Hx   ? ? ?Social History:  reports that she has been smoking cigarettes. She has a 7.50 pack-year smoking history. She has never used smokeless tobacco. She reports current alcohol use. She reports that she does not use drugs. ? ?ROS: ?All other review of systems were reviewed and are negative except what is noted above in HPI ? ?Physical Exam: ?BP 110/75   Pulse 76   LMP 02/16/2014   ?Constitutional:  Alert and oriented, No acute distress. ?HEENT: [place] AT, moist mucus membranes.  Trachea midline, no masses. ?Cardiovascular: No clubbing, cyanosis, or edema. ?Respiratory: Normal respiratory effort, no increased work of breathing. ?GI: Abdomen is soft, nontender, nondistended, no abdominal masses ?GU: No CVA tenderness.  ?Lymph: No cervical or inguinal lymphadenopathy. ?Skin: No rashes, bruises or suspicious lesions. ?Neurologic: Grossly intact, no focal deficits, moving all 4 extremities. ?Psychiatric: Normal mood and affect. ? ?Laboratory Data: ?Lab Results  ?Component Value Date  ? WBC 10.1 11/03/2021  ? HGB 15.2 11/03/2021  ? HCT 44.2 11/03/2021  ? MCV 96 11/03/2021  ? PLT 347 11/03/2021  ? ? ?Lab Results  ?Component Value Date  ? CREATININE 0.64 11/03/2021  ? ? ?No results found for: PSA ? ?No results found for: TESTOSTERONE ? ?Lab Results  ?Component Value Date  ? HGBA1C 6.5 (H) 11/03/2021  ? ? ?Urinalysis ?   ?Component Value Date/Time  ? [place] YELLOW 03/18/2018 1130  ? APPEARANCEUR Clear 02/04/2021 1335  ? LABSPEC 1.014 03/18/2018 1130  ? PHURINE 6.0 03/18/2018 1130  ? GLUCOSEU Negative 02/04/2021 1335  ? HGBUR MODERATE (A) 03/18/2018 1130  ? BILIRUBINUR Negative 02/04/2021 1335  ? [place] NEGATIVE 03/18/2018 1130  ? PROTEINUR Negative 02/04/2021 1335  ? PROTEINUR NEGATIVE 03/18/2018 1130  ? UROBILINOGEN 0.2 08/28/2015 2130  ? NITRITE Negative 02/04/2021 1335  ? NITRITE NEGATIVE 03/18/2018 1130   ? LEUKOCYTESUR Negative 02/04/2021 1335  ? ? ?Lab Results  ?Component Value Date  ? LABMICR 5.2 04/13/2021  ? WBCUA 0-5 02/04/2021  ? LABEPIT 0-10 02/04/2021  ? BACTERIA Few 02/04/2021  ? ? ?Pertinent Imaging: ?Renal US 01/31/2022: Images reviewed and discussed with the patient  ?Results for orders placed during the hospital encounter of 04/15/19 ? ?DG Abd 1 View ? ?Narrative ?CLINICAL DATA:  Right-sided kidney stones.  Right-sided flank pain. ? ?EXAM: ?ABDOMEN - 1 VIEW ? ?COMPARISON:  12/11/2018 ? ?FINDINGS: ?There is an 8 mm stone that projects over the expected region of the ?right renal pelvis. There is an 8 mm stone projecting over the lower ?pole the right kidney. Multiple phleboliths project over the ?patient's left hemipelvis. No evidence of left-sided nephrolithiasis ?on this examination. ? ?IMPRESSION: ?Right-sided nephrolithiasis as detailed  above. ? ? ?Electronically Signed ?By: Constance Holster M.D. ?On: 04/16/2019 03:00 ? ?No results found for this or any previous visit. ? ?No results found for this or any previous visit. ? ?No results found for this or any previous visit. ? ?Results for orders placed during the hospital encounter of 01/31/22 ? ?Ultrasound renal complete ? ?Narrative ?CLINICAL DATA:  History of kidney stones. ? ?EXAM: ?RENAL / URINARY TRACT ULTRASOUND COMPLETE ? ?COMPARISON:  Prior renal ultrasounds, most recent dated 02/01/2021. ? ?FINDINGS: ?Right Kidney: ? ?Renal measurements: 12 x 5.3 x 6.1 cm = volume: 202 mL. Normal ?parenchymal echogenicity. Linear shadowing focus in the lower pole, ?7-8 mm in size, consistent with a stone, not seen on the most recent ?prior study, but similar to the exam dated 10/08/2019. No other ?evidence of an intrarenal stone. No masses. No hydronephrosis. ? ?Left Kidney: ? ?Renal measurements: 12.3 x 6.7 x 5.9 cm = volume: 255 mL. ?Echogenicity within normal limits. No mass or hydronephrosis ?visualized. ? ?Bladder: ? ?Appears normal for degree of  bladder distention. ? ?Other: ? ?None. ? ?IMPRESSION: ?1. No acute findings.  No hydronephrosis. ?2. 7-8 mm poorly defined stone in the lower pole the right kidney ?similar to what was present on the exam dated 11/

## 2022-02-08 LAB — URINALYSIS, ROUTINE W REFLEX MICROSCOPIC
Bilirubin, UA: NEGATIVE
Glucose, UA: NEGATIVE
Ketones, UA: NEGATIVE
Leukocytes,UA: NEGATIVE
Nitrite, UA: NEGATIVE
Protein,UA: NEGATIVE
Specific Gravity, UA: 1.02 (ref 1.005–1.030)
Urobilinogen, Ur: 0.2 mg/dL (ref 0.2–1.0)
pH, UA: 6 (ref 5.0–7.5)

## 2022-02-08 LAB — MICROSCOPIC EXAMINATION: WBC, UA: NONE SEEN /hpf (ref 0–5)

## 2022-02-10 LAB — HEPATIC FUNCTION PANEL
AG Ratio: 1.6 (calc) (ref 1.0–2.5)
ALT: 37 U/L — ABNORMAL HIGH (ref 6–29)
AST: 23 U/L (ref 10–35)
Albumin: 4.1 g/dL (ref 3.6–5.1)
Alkaline phosphatase (APISO): 72 U/L (ref 37–153)
Bilirubin, Direct: 0.1 mg/dL (ref 0.0–0.2)
Globulin: 2.5 g/dL (calc) (ref 1.9–3.7)
Indirect Bilirubin: 0.3 mg/dL (calc) (ref 0.2–1.2)
Total Bilirubin: 0.4 mg/dL (ref 0.2–1.2)
Total Protein: 6.6 g/dL (ref 6.1–8.1)

## 2022-02-10 LAB — HEPATITIS C ANTIBODY
Hepatitis C Ab: NONREACTIVE
SIGNAL TO CUT-OFF: <0.02 (ref <1.00)

## 2022-02-10 LAB — HEPATITIS B SURFACE ANTIBODY,QUALITATIVE: Hep B S Ab: NONREACTIVE

## 2022-02-10 LAB — IRON,TIBC AND FERRITIN PANEL
%SAT: 20 % (calc) (ref 16–45)
Ferritin: 232 ng/mL (ref 16–232)
Iron: 57 ug/dL (ref 45–160)
TIBC: 281 mcg/dL (calc) (ref 250–450)

## 2022-02-10 LAB — HEPATITIS B SURFACE ANTIGEN: Hepatitis B Surface Ag: NONREACTIVE

## 2022-02-10 LAB — HEPATITIS B CORE ANTIBODY, TOTAL: Hep B Core Total Ab: NONREACTIVE

## 2022-02-13 ENCOUNTER — Other Ambulatory Visit: Payer: Self-pay | Admitting: *Deleted

## 2022-02-13 DIAGNOSIS — R7401 Elevation of levels of liver transaminase levels: Secondary | ICD-10-CM

## 2022-02-13 DIAGNOSIS — K838 Other specified diseases of biliary tract: Secondary | ICD-10-CM

## 2022-02-15 ENCOUNTER — Ambulatory Visit: Payer: Medicaid Other

## 2022-02-15 NOTE — Patient Instructions (Signed)
? ? ? ? ? Michaela Aguilar ? 02/15/2022  ?  ? '@PREFPERIOPPHARMACY'$ @ ? ? Your procedure is scheduled on  02/24/2022. ? ? Report to [facility] at  [facility]  A.[facility]. ? ? Call this number if you have problems the morning of surgery: ? [phone] ? ? Remember: ? Follow the diet and prep instructions given to you by the office. ?  ? Take these medicines the morning of surgery with A SIP OF WATER   ? ?flexeril(if needed), pepcid. ?  ? Do not wear jewelry, make-up or nail polish. ? Do not wear lotions, powders, or perfumes, or deodorant. ? Do not shave 48 hours prior to surgery.  Men may shave face and neck. ? Do not bring valuables to the hospital. ? [facility] is not responsible for any belongings or valuables. ? ?Contacts, dentures or bridgework may not be worn into surgery.  Leave your suitcase in the car.  After surgery it may be brought to your room. ? ?For patients admitted to the hospital, discharge time will be determined by your treatment team. ? ?Patients discharged the day of surgery will not be allowed to drive home and must have someone with them for 24 hours.  ? ? ?Special instructions:   DO NOT smoke tobacco or vape for 24 hours before your procedure. ? ?Please read over the following fact sheets that you were given. ?Anesthesia Post-op Instructions and Care and Recovery After Surgery ?  ? ? ? Colonoscopy, Adult, Care After ?This sheet gives you information about how to care for yourself after your procedure. Your health care provider may also give you more specific instructions. If you have problems or questions, contact your health care provider. ?What can I expect after the procedure? ?After the procedure, it is common to have: ?A small amount of blood in your stool for 24 hours after the procedure. ?Some gas. ?Mild cramping or bloating of your abdomen. ?Follow these instructions at home: ?Eating and drinking ? ?Drink enough fluid to keep your urine pale yellow. ?Follow instructions from your health care provider  about eating or drinking restrictions. ?Resume your normal diet as instructed by your health care provider. Avoid heavy or fried foods that are hard to digest. ?Activity ?Rest as told by your health care provider. ?Avoid sitting for a long time without moving. Get up to take short walks every 1-2 hours. This is important to improve blood flow and breathing. Ask for help if you feel weak or unsteady. ?Return to your normal activities as told by your health care provider. Ask your health care provider what activities are safe for you. ?Managing cramping and bloating ? ?Try walking around when you have cramps or feel bloated. ?Apply heat to your abdomen as told by your health care provider. Use the heat source that your health care provider recommends, such as a moist heat pack or a heating pad. ?Place a towel between your skin and the heat source. ?Leave the heat on for 20-30 minutes. ?Remove the heat if your skin turns bright red. This is especially important if you are unable to feel pain, heat, or cold. You may have a greater risk of getting burned. ?General instructions ?If you were given a sedative during the procedure, it can affect you for several hours. Do not drive or operate machinery until your health care provider says that it is safe. ?For the first 24 hours after the procedure: ?Do not sign important documents. ?Do not drink alcohol. ?Do  your regular daily activities at a slower pace than normal. ?Eat soft foods that are easy to digest. ?Take over-the-counter and prescription medicines only as told by your health care provider. ?Keep all follow-up visits as told by your health care provider. This is important. ?Contact a health care provider if: ?You have blood in your stool 2-3 days after the procedure. ?Get help right away if you have: ?More than a small spotting of blood in your stool. ?Large blood clots in your stool. ?Swelling of your abdomen. ?Nausea or vomiting. ?A fever. ?Increasing pain in your  abdomen that is not relieved with medicine. ?Summary ?After the procedure, it is common to have a small amount of blood in your stool. You may also have mild cramping and bloating of your abdomen. ?If you were given a sedative during the procedure, it can affect you for several hours. Do not drive or operate machinery until your health care provider says that it is safe. ?Get help right away if you have a lot of blood in your stool, nausea or vomiting, a fever, or increased pain in your abdomen. ?This information is not intended to replace advice given to you by your health care provider. Make sure you discuss any questions you have with your health care provider. ?Document Revised: 09/05/2019 Document Reviewed: 05/26/2019 ?Elsevier Patient Education ? [place]. ?Monitored Anesthesia Care, Care After ?This sheet gives you information about how to care for yourself after your procedure. Your health care provider may also give you more specific instructions. If you have problems or questions, contact your health care provider. ?What can I expect after the procedure? ?After the procedure, it is common to have: ?Tiredness. ?Forgetfulness about what happened after the procedure. ?Impaired judgment for important decisions. ?Nausea or vomiting. ?Some difficulty with balance. ?Follow these instructions at home: ?For the time period you were told by your health care provider: ?  ?Rest as needed. ?Do not participate in activities where you could fall or become injured. ?Do not drive or use machinery. ?Do not drink alcohol. ?Do not take sleeping pills or medicines that cause drowsiness. ?Do not make important decisions or sign legal documents. ?Do not take care of children on your own. ?Eating and drinking ?Follow the diet that is recommended by your health care provider. ?Drink enough fluid to keep your urine pale yellow. ?If you vomit: ?Drink water, juice, or soup when you can drink without vomiting. ?Make sure you  have little or no nausea before eating solid foods. ?General instructions ?Have a responsible adult stay with you for the time you are told. It is important to have someone help care for you until you are awake and alert. ?Take over-the-counter and prescription medicines only as told by your health care provider. ?If you have sleep apnea, surgery and certain medicines can increase your risk for breathing problems. Follow instructions from your health care provider about wearing your sleep device: ?Anytime you are sleeping, including during daytime naps. ?While taking prescription pain medicines, sleeping medicines, or medicines that make you drowsy. ?Avoid smoking. ?Keep all follow-up visits as told by your health care provider. This is important. ?Contact a health care provider if: ?You keep feeling nauseous or you keep vomiting. ?You feel light-headed. ?You are still sleepy or having trouble with balance after 24 hours. ?You develop a rash. ?You have a fever. ?You have redness or swelling around the IV site. ?Get help right away if: ?You have trouble breathing. ?You have new-onset confusion at  home. ?Summary ?For several hours after your procedure, you may feel tired. You may also be forgetful and have poor judgment. ?Have a responsible adult stay with you for the time you are told. It is important to have someone help care for you until you are awake and alert. ?Rest as told. Do not drive or operate machinery. Do not drink alcohol or take sleeping pills. ?Get help right away if you have trouble breathing, or if you suddenly become confused. ?This information is not intended to replace advice given to you by your health care provider. Make sure you discuss any questions you have with your health care provider. ?Document Revised: 07/15/2020 Document Reviewed: 10/02/2019 ?Elsevier Patient Education ? [place]. ? ?

## 2022-02-21 ENCOUNTER — Encounter (HOSPITAL_COMMUNITY)
Admission: RE | Admit: 2022-02-21 | Discharge: 2022-02-21 | Disposition: A | Discharge status: Home / Self Care | Payer: Medicaid Other | Source: Ambulatory Visit | Attending: Internal Medicine | Admitting: Internal Medicine

## 2022-02-21 ENCOUNTER — Encounter (HOSPITAL_COMMUNITY): Payer: Self-pay

## 2022-02-21 HISTORY — DX: Calculus of gallbladder without cholecystitis without obstruction: K80.20

## 2022-02-21 HISTORY — DX: Essential (primary) hypertension: I10

## 2022-02-21 HISTORY — DX: Unspecified asthma, uncomplicated: J45.909

## 2022-02-24 ENCOUNTER — Encounter (HOSPITAL_COMMUNITY): Payer: Self-pay

## 2022-02-24 ENCOUNTER — Ambulatory Visit (HOSPITAL_COMMUNITY)
Admission: RE | Admit: 2022-02-24 | Discharge: 2022-02-24 | Disposition: A | Discharge status: Home / Self Care | Payer: Medicaid Other | Attending: Internal Medicine | Admitting: Internal Medicine

## 2022-02-24 ENCOUNTER — Ambulatory Visit (HOSPITAL_BASED_OUTPATIENT_CLINIC_OR_DEPARTMENT_OTHER): Payer: Medicaid Other | Admitting: Anesthesiology

## 2022-02-24 ENCOUNTER — Ambulatory Visit (HOSPITAL_COMMUNITY): Payer: Medicaid Other | Admitting: Anesthesiology

## 2022-02-24 ENCOUNTER — Other Ambulatory Visit: Payer: Self-pay

## 2022-02-24 ENCOUNTER — Encounter (HOSPITAL_COMMUNITY)
Admission: RE | Disposition: A | Discharge status: Home / Self Care | Payer: Self-pay | Source: Home / Self Care | Attending: Internal Medicine

## 2022-02-24 DIAGNOSIS — Z79899 Other long term (current) drug therapy: Secondary | ICD-10-CM | POA: Diagnosis not present

## 2022-02-24 DIAGNOSIS — Z7985 Long-term (current) use of injectable non-insulin antidiabetic drugs: Secondary | ICD-10-CM | POA: Diagnosis not present

## 2022-02-24 DIAGNOSIS — Z1211 Encounter for screening for malignant neoplasm of colon: Secondary | ICD-10-CM | POA: Diagnosis not present

## 2022-02-24 DIAGNOSIS — F17210 Nicotine dependence, cigarettes, uncomplicated: Secondary | ICD-10-CM | POA: Diagnosis not present

## 2022-02-24 DIAGNOSIS — D120 Benign neoplasm of cecum: Secondary | ICD-10-CM

## 2022-02-24 DIAGNOSIS — R195 Other fecal abnormalities: Secondary | ICD-10-CM | POA: Insufficient documentation

## 2022-02-24 DIAGNOSIS — E669 Obesity, unspecified: Secondary | ICD-10-CM | POA: Insufficient documentation

## 2022-02-24 DIAGNOSIS — Z6837 Body mass index (BMI) 37.0-37.9, adult: Secondary | ICD-10-CM | POA: Insufficient documentation

## 2022-02-24 DIAGNOSIS — J449 Chronic obstructive pulmonary disease, unspecified: Secondary | ICD-10-CM | POA: Insufficient documentation

## 2022-02-24 DIAGNOSIS — K648 Other hemorrhoids: Secondary | ICD-10-CM | POA: Insufficient documentation

## 2022-02-24 DIAGNOSIS — E119 Type 2 diabetes mellitus without complications: Secondary | ICD-10-CM | POA: Insufficient documentation

## 2022-02-24 DIAGNOSIS — K635 Polyp of colon: Secondary | ICD-10-CM

## 2022-02-24 HISTORY — PX: COLONOSCOPY WITH PROPOFOL: SHX5780

## 2022-02-24 LAB — GLUCOSE, CAPILLARY: Glucose-Capillary: 123 mg/dL — ABNORMAL HIGH (ref 70–99)

## 2022-02-24 SURGERY — COLONOSCOPY WITH PROPOFOL
Anesthesia: General

## 2022-02-24 MED ORDER — LACTATED RINGERS IV SOLN: INTRAVENOUS | Status: DC

## 2022-02-24 MED ORDER — LACTATED RINGERS IV SOLN: INTRAVENOUS | Status: DC | PRN

## 2022-02-24 MED ORDER — PROPOFOL 500 MG/50ML IV EMUL
INTRAVENOUS | Status: DC | PRN
  Administered 2022-02-24: 225 ug/kg/min via INTRAVENOUS

## 2022-02-24 MED ORDER — PROPOFOL 10 MG/ML IV BOLUS
INTRAVENOUS | Status: DC | PRN
  Administered 2022-02-24: 200 mg via INTRAVENOUS

## 2022-02-24 MED ORDER — LIDOCAINE HCL (CARDIAC) PF 100 MG/5ML IV SOSY
PREFILLED_SYRINGE | INTRAVENOUS | Status: DC | PRN
  Administered 2022-02-24: 60 mg via INTRATRACHEAL

## 2022-02-24 NOTE — Op Note (Signed)
[facility] ?Patient Name: Michaela Aguilar ?Procedure Date: 02/24/2022 8:16 AM ?MRN: [id number] ?Date of Birth: September 13, 1966 ?Attending MD: Elon Alas. Abbey Chatters , DO ?CSN: [id number] ?Age: 56 ?Admit Type: Outpatient ?Procedure:                Colonoscopy ?Indications:              Positive Cologuard test ?Providers:                Elon Alas. Abbey Chatters, DO, Lurline Del, RN, Ulice Dash  ?                          Blima Singer, Technician ?Referring MD:              ?Medicines:                See the Anesthesia note for documentation of the  ?                          administered medications ?Complications:            No immediate complications. ?Estimated Blood Loss:     Estimated blood loss was minimal. ?Procedure:                Pre-Anesthesia Assessment: ?                          - The anesthesia plan was to use monitored  ?                          anesthesia care (MAC). ?                          After obtaining informed consent, the colonoscope  ?                          was passed under direct vision. Throughout the  ?                          procedure, the patient's blood pressure, pulse, and  ?                          oxygen saturations were monitored continuously. The  ?                          PCF-HQ190L ([id number]) scope was introduced through  ?                          the anus and advanced to the the cecum, identified  ?                          by appendiceal orifice and ileocecal valve. The  ?                          colonoscopy was performed without difficulty. The  ?                          patient tolerated the procedure well. The quality  ?  of the bowel preparation was evaluated using the  ?                          BBPS [facility] Bowel Preparation Scale) with scores  ?                          of: Right Colon = 3, Transverse Colon = 3 and Left  ?                          Colon = 3 (entire mucosa seen well with no residual  ?                          staining, small fragments of  stool or opaque  ?                          liquid). The total BBPS score equals 9. ?Scope In: 8:23:59 AM ?Scope Out: 8:34:35 AM ?Scope Withdrawal Time: 0 hours 8 minutes 22 seconds  ?Total Procedure Duration: 0 hours 10 minutes 36 seconds  ?Findings: ?     The perianal and digital rectal examinations were normal. ?     Non-bleeding internal hemorrhoids were found during endoscopy. ?     A 4 mm polyp was found in the cecum. The polyp was sessile. The polyp  ?     was removed with a cold snare. Resection and retrieval were complete. ?Impression:               - Non-bleeding internal hemorrhoids. ?                          - One 4 mm polyp in the cecum, removed with a cold  ?                          snare. Resected and retrieved. ?Moderate Sedation: ?     Per Anesthesia Care ?Recommendation:           - Patient has a contact number available for  ?                          emergencies. The signs and symptoms of potential  ?                          delayed complications were discussed with the  ?                          patient. Return to normal activities tomorrow.  ?                          Written discharge instructions were provided to the  ?                          patient. ?                          - Resume previous diet. ?                          -  Continue present medications. ?                          - Await pathology results. ?                          - Repeat colonoscopy in 5-10 years for surveillance. ?                          - Return to GI clinic PRN. ?Procedure Code(s):        --- Professional --- ?                          [phone], Colonoscopy, flexible; with removal of  ?                          tumor(s), polyp(s), or other lesion(s) by snare  ?                          technique ?Diagnosis Code(s):        --- Professional --- ?                          K63.5, Polyp of colon ?                          K64.8, Other hemorrhoids ?                          R19.5, Other fecal abnormalities ?CPT  copyright 2019 American Medical Association. All rights reserved. ?The codes documented in this report are preliminary and upon coder review may  ?be revised to meet current compliance requirements. ?Elon Alas. Abbey Chatters, DO ?Elon Alas. Crenshaw, DO ?02/24/2022 8:37:09 AM ?This report has been signed electronically. ?Number of Addenda: 0 ?

## 2022-02-24 NOTE — Transfer of Care (Signed)
Immediate Anesthesia Transfer of Care Note ? ?Patient: Michaela Aguilar ? ?Procedure(s) Performed: COLONOSCOPY WITH PROPOFOL ? ?Patient Location: Short Stay ? ?Anesthesia Type:General ? ?Level of Consciousness: sedated ? ?Airway & Oxygen Therapy: Patient Spontanous Breathing ? ?Post-op Assessment: Report given to RN and Post -op Vital signs reviewed and stable ? ?Post vital signs: Reviewed and stable ? ?Last Vitals:  ?Vitals Value Taken Time  ?BP    ?Temp    ?Pulse    ?Resp    ?SpO2    ? ? ?Last Pain:  ?Vitals:  ? 02/24/22 0715  ?TempSrc:   ?PainSc: 0-No pain  ?   ? ?  ? ?Complications: No notable events documented. ?

## 2022-02-24 NOTE — Interval H&P Note (Signed)
History and Physical Interval Note: ? ?02/24/2022 ?7:35 AM ? ?Michaela Aguilar  has presented today for surgery, with the diagnosis of positive cologuard.  The various methods of treatment have been discussed with the patient and family. After consideration of risks, benefits and other options for treatment, the patient has consented to  Procedure(s) with comments: ?COLONOSCOPY WITH PROPOFOL (N/A) - 8:30am as a surgical intervention.  The patient's history has been reviewed, patient examined, no change in status, stable for surgery.  I have reviewed the patient's chart and labs.  Questions were answered to the patient's satisfaction.   ? ? ?[name] ? ? ?

## 2022-02-24 NOTE — Anesthesia Preprocedure Evaluation (Signed)
Anesthesia Evaluation  ?Patient identified by MRN, date of birth, ID band ?Patient awake ? ? ? ?Reviewed: ?Allergy & Precautions, NPO status , Patient's Chart, lab work & pertinent test results ? ?Airway ?Mallampati: II ? ?TM Distance: >3 FB ?Neck ROM: Full ? ? ? Dental ? ?(+) Dental Advisory Given, Missing ?  ?Pulmonary ?asthma , COPD,  COPD inhaler, Current Smoker and Patient abstained from smoking.,  ?  ?Pulmonary exam normal ?breath sounds clear to auscultation ? ? ? ? ? ? Cardiovascular ?Exercise Tolerance: Good ?hypertension (denied h/o BP, takes lisinopril for kidney protection nt for blood pressure), Pt. on medications ?Normal cardiovascular exam ?Rhythm:Regular Rate:Normal ? ? ?  ?Neuro/Psych ?PSYCHIATRIC DISORDERS Anxiety Depression negative neurological ROS ?   ? GI/Hepatic ?negative GI ROS, Neg liver ROS,   ?Endo/Other  ?diabetes, Well Controlled, Type 2, Oral Hypoglycemic Agents ? Renal/GU ?negative Renal ROS  ?negative genitourinary ?  ?Musculoskeletal ?negative musculoskeletal ROS ?(+)  ? Abdominal ?  ?Peds ?negative pediatric ROS ?(+)  Hematology ?negative hematology ROS ?(+)   ?Anesthesia Other Findings ? ? Reproductive/Obstetrics ?negative OB ROS ? ?  ? ? ? ? ? ? ? ? ? ? ? ? ? ?  ?  ? ? ? ? ? ? ? ? ?Anesthesia Physical ?Anesthesia Plan ? ?ASA: 2 ? ?Anesthesia Plan: General  ? ?Post-op Pain Management: Minimal or no pain anticipated  ? ?Induction: Intravenous ? ?PONV Risk Score and Plan: Propofol infusion ? ?Airway Management Planned: Nasal Cannula and Natural Airway ? ?Additional Equipment:  ? ?Intra-op Plan:  ? ?Post-operative Plan:  ? ?Informed Consent: I have reviewed the patients History and Physical, chart, labs and discussed the procedure including the risks, benefits and alternatives for the proposed anesthesia with the patient or authorized representative who has indicated his/her understanding and acceptance.  ? ? ? ?Dental advisory given ? ?Plan Discussed  with: CRNA and Surgeon ? ?Anesthesia Plan Comments:   ? ? ? ? ? ? ?Anesthesia Quick Evaluation ? ?

## 2022-02-24 NOTE — Discharge Instructions (Addendum)
?  Colonoscopy ?Discharge Instructions ? ?Read the instructions outlined below and refer to this sheet in the next few weeks. These discharge instructions provide you with general information on caring for yourself after you leave the hospital. Your doctor may also give you specific instructions. While your treatment has been planned according to the most current medical practices available, unavoidable complications occasionally occur.  ? ?ACTIVITY ?You may resume your regular activity, but move at a slower pace for the next 24 hours.  ?Take frequent rest periods for the next 24 hours.  ?Walking will help get rid of the air and reduce the bloated feeling in your belly (abdomen).  ?No driving for 24 hours (because of the medicine (anesthesia) used during the test).   ?Do not sign any important legal documents or operate any machinery for 24 hours (because of the anesthesia used during the test).  ?NUTRITION ?Drink plenty of fluids.  ?You may resume your normal diet as instructed by your doctor.  ?Begin with a light meal and progress to your normal diet. Heavy or fried foods are harder to digest and may make you feel sick to your stomach (nauseated).  ?Avoid alcoholic beverages for 24 hours or as instructed.  ?MEDICATIONS ?You may resume your normal medications unless your doctor tells you otherwise.  ?WHAT YOU CAN EXPECT TODAY ?Some feelings of bloating in the abdomen.  ?Passage of more gas than usual.  ?Spotting of blood in your stool or on the toilet paper.  ?IF YOU HAD POLYPS REMOVED DURING THE COLONOSCOPY: ?No aspirin products for 7 days or as instructed.  ?No alcohol for 7 days or as instructed.  ?Eat a soft diet for the next 24 hours.  ?FINDING OUT THE RESULTS OF YOUR TEST ?Not all test results are available during your visit. If your test results are not back during the visit, make an appointment with your caregiver to find out the results. Do not assume everything is normal if you have not heard from your  caregiver or the medical facility. It is important for you to follow up on all of your test results.  ?SEEK IMMEDIATE MEDICAL ATTENTION IF: ?You have more than a spotting of blood in your stool.  ?Your belly is swollen (abdominal distention).  ?You are nauseated or vomiting.  ?You have a temperature over 101.  ?You have abdominal pain or discomfort that is severe or gets worse throughout the day.  ? ?Your colonoscopy revealed 1 polyp(s) which I removed successfully. Await pathology results, my office will contact you. I recommend repeating colonoscopy in 5-10 years for surveillance purposes depending on path results. Otherwise follow up with GI as needed. .  ? ? ?I hope you have a great rest of your week! ? ?Elon Alas. Abbey Chatters, D.O. ?Gastroenterology and Hepatology ?[facility] ? ?

## 2022-02-24 NOTE — Anesthesia Postprocedure Evaluation (Signed)
Anesthesia Post Note ? ?Patient: Michaela Aguilar ? ?Procedure(s) Performed: COLONOSCOPY WITH PROPOFOL ? ?Patient location during evaluation: Phase II ?Anesthesia Type: General ?Level of consciousness: awake and alert and oriented ?Pain management: pain level controlled ?Vital Signs Assessment: post-procedure vital signs reviewed and stable ?Respiratory status: spontaneous breathing, nonlabored ventilation and respiratory function stable ?Cardiovascular status: blood pressure returned to baseline and stable ?Postop Assessment: no apparent nausea or vomiting ?Anesthetic complications: no ? ? ?No notable events documented. ? ? ?Last Vitals:  ?Vitals:  ? 02/24/22 0839 02/24/22 0841  ?BP:  (!) 101/58  ?Pulse: 77   ?Resp: 18   ?Temp: 36.6 ?C   ?SpO2: 96%   ?  ?Last Pain:  ?Vitals:  ? 02/24/22 0839  ?TempSrc: Oral  ?PainSc:   ? ? ?  ?  ?  ?  ?  ?  ? ?Mate Alegria C Reka Wist ? ? ? ? ?

## 2022-02-27 ENCOUNTER — Telehealth: Payer: Self-pay | Admitting: Internal Medicine

## 2022-02-27 LAB — SURGICAL PATHOLOGY

## 2022-02-27 NOTE — Telephone Encounter (Signed)
Pt was calling for her procedure results. [phone] ?

## 2022-02-28 ENCOUNTER — Encounter (HOSPITAL_COMMUNITY): Payer: Self-pay | Admitting: Internal Medicine

## 2022-03-01 ENCOUNTER — Other Ambulatory Visit: Payer: Self-pay | Admitting: Gastroenterology

## 2022-03-01 ENCOUNTER — Ambulatory Visit (HOSPITAL_COMMUNITY)
Admission: RE | Admit: 2022-03-01 | Discharge: 2022-03-01 | Disposition: A | Discharge status: Home / Self Care | Payer: Medicaid Other | Source: Ambulatory Visit | Attending: Gastroenterology | Admitting: Gastroenterology

## 2022-03-01 DIAGNOSIS — K838 Other specified diseases of biliary tract: Secondary | ICD-10-CM

## 2022-03-01 DIAGNOSIS — R7401 Elevation of levels of liver transaminase levels: Secondary | ICD-10-CM | POA: Diagnosis present

## 2022-03-01 MED ORDER — GADOBUTROL 1 MMOL/ML IV SOLN
10.0000 mL | Freq: Once | INTRAVENOUS | Status: AC | PRN
  Administered 2022-03-01: 10 mL via INTRAVENOUS

## 2022-03-02 NOTE — Telephone Encounter (Signed)
I spoke with her today about MRI results as well as her colonoscopy results as she wasn't clear on this.  ?

## 2022-03-02 NOTE — Telephone Encounter (Signed)
A letter was mailed to the pt.  ?

## 2022-03-03 ENCOUNTER — Encounter: Payer: Self-pay | Admitting: Internal Medicine

## 2022-03-08 ENCOUNTER — Encounter: Payer: Medicaid Other | Attending: Gastroenterology | Admitting: Nutrition

## 2022-03-08 DIAGNOSIS — E119 Type 2 diabetes mellitus without complications: Secondary | ICD-10-CM | POA: Insufficient documentation

## 2022-03-08 DIAGNOSIS — Z6838 Body mass index (BMI) 38.0-38.9, adult: Secondary | ICD-10-CM | POA: Diagnosis not present

## 2022-03-08 DIAGNOSIS — Z713 Dietary counseling and surveillance: Secondary | ICD-10-CM | POA: Insufficient documentation

## 2022-03-08 DIAGNOSIS — E1169 Type 2 diabetes mellitus with other specified complication: Secondary | ICD-10-CM

## 2022-03-08 DIAGNOSIS — E669 Obesity, unspecified: Secondary | ICD-10-CM | POA: Insufficient documentation

## 2022-03-08 NOTE — Patient Instructions (Signed)
Goals ? ?Eat three meals per day at times at the time discussed ?Drink only water-cut out diet sodas ?Increase fresh fruits and vegetables and only plant  based foods. ?Exercise 30 minutes 3+ times per week ?Test blood sugars twice a day  ?Get A1C to 5.7% ?Lose 1 lb per week. ? ?

## 2022-03-08 NOTE — Progress Notes (Signed)
Medical Nutrition Therapy  Appointment Start time:  1300  Appointment End time:  1400  Primary concerns today: Obesity, DM Type 2  Referral diagnosis: E11.8, E66.9 Preferred learning style: No preference . Learning readiness: Ready   NUTRITION ASSESSMENT  57 y old female with Type 2 DM and Obesity. Currently only on Trulicity weekly. PCP: Demetrius Revel,  GI Kristen Harper  A1C 6.5% BMI 37.   Current diet is insuffient and inconsistent to meet her needs to control BS and lose weight. She is willing to work on lifestyle medicine to reverse her Dm and help her lose weight and reduce any co morbidities.   Anthropometrics  Wt Readings from Last 3 Encounters:  03/08/22 224 lb 9.6 oz (101.9 kg)  02/24/22 222 lb 10.6 oz (101 kg)  01/25/22 228 lb 9.6 oz (103.7 kg)   Ht Readings from Last 3 Encounters:  03/08/22 '5\' 5"'$  (1.651 m)  02/24/22 '5\' 5"'$  (1.651 m)  01/25/22 '5\' 5"'$  (1.651 m)   Body mass index is 37.38 kg/m. '@BMIFA'$ @ Facility age limit for growth percentiles is 20 years. Facility age limit for growth percentiles is 20 years.    Clinical Medical Hx: See chart Medications: Truliciity Labs: HIghest A1C 7%. Lab Results  Component Value Date   HGBA1C 6.5 (H) 11/03/2021      Latest Ref Rng & Units 02/09/2022    1:38 PM 11/03/2021    8:44 AM 05/04/2021   10:49 AM  CMP  Glucose 70 - 99 mg/dL  129     BUN 6 - 24 mg/dL  11     Creatinine 0.57 - 1.00 mg/dL  0.64     Sodium 134 - 144 mmol/L  139     Potassium 3.5 - 5.2 mmol/L  5.1     Chloride 96 - 106 mmol/L  99     CO2 20 - 29 mmol/L  24     Calcium 8.7 - 10.2 mg/dL  10.3   10.1    Total Protein 6.1 - 8.1 g/dL 6.6   7.1     Total Bilirubin 0.2 - 1.2 mg/dL 0.4   0.6     Alkaline Phos 44 - 121 IU/L  90     AST 10 - 35 U/L 23   29     ALT 6 - 29 U/L 37   47       Notable Signs/Symptoms: N0ne  Lifestyle & Dietary Hx Has a roommate, she cooks and shops for herself. Eats 2 meals, skips lunch. Dx 5+years ago.   Estimated  daily fluid intake 16-24 oz Supplements: MVI, Omega Fish Oil, Vit D Sleep: 8 Stress / self-care: none Current average weekly physical activity:  ADL 24-Hr Dietary Recall First Meal:  Snack:  Second Meal: pb nabs, 30 oz Snack: 2 diet mt dews Third Meal: Tacos 2, water Snack: honeybun Beverages: water, Diet mt dew, Lipton green tea  Estimated Energy Needs Calories: 1200 Carbohydrate: 135g Protein: 90g Fat: 33g   NUTRITION DIAGNOSIS  NB-1.2 Harmful beliefs/attitudes about food or nutrition-related topics (use with caution) As related to DIabetes Type 2.  As evidenced by A1C 6.5%..   NUTRITION INTERVENTION  Nutrition education (E-1) on the following topics:  Nutrition and Diabetes education provided on My Plate, CHO counting, meal planning, portion sizes, timing of meals, avoiding snacks between meals unless having a low blood sugar, target ranges for A1C and blood sugars, signs/symptoms and treatment of hyper/hypoglycemia, monitoring blood sugars, taking medications as prescribed, benefits of exercising  30 minutes per day and prevention of complications of DM.  Lifestyle Medicine  - Whole Food, Plant Predominant Nutrition is highly recommended: Eat Plenty of vegetables, Mushrooms, fruits, Legumes, Whole Grains, Nuts, seeds in lieu of processed meats, processed snacks/pastries red meat, poultry, eggs.    -It is better to avoid simple carbohydrates including: Cakes, Sweet Desserts, Ice Cream, Soda (diet and regular), Sweet Tea, Candies, Chips, Cookies, Store Bought Juices, Alcohol in Excess of  1-2 drinks a day, Lemonade,  Artificial Sweeteners, Doughnuts, Coffee Creamers, "Sugar-free" Products, etc, etc.  This is not a complete list.....  Exercise: If you are able: 30 -60 minutes a day ,4 days a week, or 150 minutes a week.  The longer the better.  Combine stretch, strength, and aerobic activities.  If you were told in the past that you have high risk for cardiovascular diseases, you  may seek evaluation by your heart doctor prior to initiating moderate to intense exercise programs.   Handouts Provided Include  Lifestyle Medicine Meal Plan plant based Meal Plan card   Learning Style & Readiness for Change Teaching method utilized: Visual & Auditory  Demonstrated degree of understanding via: Teach Back  Barriers to learning/adherence to lifestyle change: none  Goals Established by Pt Goals  Eat three meals per day at times at the time discussed Drink only water-cut out diet sodas Increase fresh fruits and vegetables and only plant  based foods. Exercise 30 minutes 3+ times per week Test blood sugars twice a day  Get A1C to 5.7% Lose 1 lb per week.   MONITORING & EVALUATION Dietary intake, weekly physical activity, and blood sugars  in 1 month.  Next Steps  Patient is to work on consistent meals and better meal planning and exercise.Marland Kitchen

## 2022-04-04 ENCOUNTER — Encounter: Payer: Self-pay | Admitting: Nutrition

## 2022-04-06 ENCOUNTER — Encounter: Payer: Self-pay | Admitting: Nutrition

## 2022-04-06 ENCOUNTER — Encounter: Payer: Medicaid Other | Attending: Gastroenterology | Admitting: Nutrition

## 2022-04-06 VITALS — Ht 65.0 in | Wt 219.4 lb

## 2022-04-06 DIAGNOSIS — E1169 Type 2 diabetes mellitus with other specified complication: Secondary | ICD-10-CM | POA: Insufficient documentation

## 2022-04-06 DIAGNOSIS — Z6838 Body mass index (BMI) 38.0-38.9, adult: Secondary | ICD-10-CM | POA: Diagnosis present

## 2022-04-06 DIAGNOSIS — E119 Type 2 diabetes mellitus without complications: Secondary | ICD-10-CM | POA: Diagnosis present

## 2022-04-06 DIAGNOSIS — E785 Hyperlipidemia, unspecified: Secondary | ICD-10-CM | POA: Insufficient documentation

## 2022-04-06 NOTE — Progress Notes (Signed)
Medical Nutrition Therapy  Appointment Start time:  1100  Appointment End time:  1130  Primary concerns today: Obesity, DM Type 2  Referral diagnosis: E11.8, E66.9 Preferred learning style: No preference . Learning readiness: Ready   NUTRITION ASSESSMENT  26 y old female with Type 2 DM and Obesity. Currently only on Trulicity weekly. PCP: Demetrius Revel,  GI Aliene Altes  A1C down to 6%. Lost 5 lbs since last visit. Has been walking some. BMI 37. Has been tracking her food and has been eating more fruits and vegetables. Feels good. Has cut out eating out, and got rid of junk food.  FBS 106-118 mg/dl, before bed 110-150's.  She is making good progress and seems committed.  Anthropometrics  Wt Readings from Last 3 Encounters:  03/08/22 224 lb 9.6 oz (101.9 kg)  02/24/22 222 lb 10.6 oz (101 kg)  01/25/22 228 lb 9.6 oz (103.7 kg)   Ht Readings from Last 3 Encounters:  03/08/22 '5\' 5"'$  (1.651 m)  02/24/22 '5\' 5"'$  (1.651 m)  01/25/22 '5\' 5"'$  (1.651 m)   There is no height or weight on file to calculate BMI. '@BMIFA'$ @ Facility age limit for growth percentiles is 20 years. Facility age limit for growth percentiles is 20 years.    Clinical Medical Hx: See chart Medications: Truliciity Labs: HIghest A1C 7%. Lab Results  Component Value Date   HGBA1C 6.5 (H) 11/03/2021      Latest Ref Rng & Units 02/09/2022    1:38 PM 11/03/2021    8:44 AM 05/04/2021   10:49 AM  CMP  Glucose 70 - 99 mg/dL  129     BUN 6 - 24 mg/dL  11     Creatinine 0.57 - 1.00 mg/dL  0.64     Sodium 134 - 144 mmol/L  139     Potassium 3.5 - 5.2 mmol/L  5.1     Chloride 96 - 106 mmol/L  99     CO2 20 - 29 mmol/L  24     Calcium 8.7 - 10.2 mg/dL  10.3   10.1    Total Protein 6.1 - 8.1 g/dL 6.6   7.1     Total Bilirubin 0.2 - 1.2 mg/dL 0.4   0.6     Alkaline Phos 44 - 121 IU/L  90     AST 10 - 35 U/L 23   29     ALT 6 - 29 U/L 37   47       Notable Signs/Symptoms: None  Lifestyle & Dietary Hx Has a  roommate, she cooks and shops for herself. Eats 2 meals, skips lunch. Dx 5+years ago.   Estimated daily fluid intake 16-24 oz Supplements: MVI, Omega Fish Oil, Vit D Sleep: 8 Stress / self-care: none Current average weekly physical activity:  ADL 24-Hr Dietary Recall First Meal: Oatmeal Snack:  Second Meal:  navy beans, salad, water Snack:  Third Meal: Beans, salad, water Estimated Energy Needs Calories: 1200 Carbohydrate: 135g Protein: 90g Fat: 33g   NUTRITION DIAGNOSIS  NB-1.2 Harmful beliefs/attitudes about food or nutrition-related topics (use with caution) As related to DIabetes Type 2.  As evidenced by A1C 6.5%..   NUTRITION INTERVENTION  Nutrition education (E-1) on the following topics:  Nutrition and Diabetes education provided on My Plate, CHO counting, meal planning, portion sizes, timing of meals, avoiding snacks between meals unless having a low blood sugar, target ranges for A1C and blood sugars, signs/symptoms and treatment of hyper/hypoglycemia, monitoring blood sugars, taking medications  as prescribed, benefits of exercising 30 minutes per day and prevention of complications of DM.  Lifestyle Medicine  - Whole Food, Plant Predominant Nutrition is highly recommended: Eat Plenty of vegetables, Mushrooms, fruits, Legumes, Whole Grains, Nuts, seeds in lieu of processed meats, processed snacks/pastries red meat, poultry, eggs.    -It is better to avoid simple carbohydrates including: Cakes, Sweet Desserts, Ice Cream, Soda (diet and regular), Sweet Tea, Candies, Chips, Cookies, Store Bought Juices, Alcohol in Excess of  1-2 drinks a day, Lemonade,  Artificial Sweeteners, Doughnuts, Coffee Creamers, "Sugar-free" Products, etc, etc.  This is not a complete list.....  Exercise: If you are able: 30 -60 minutes a day ,4 days a week, or 150 minutes a week.  The longer the better.  Combine stretch, strength, and aerobic activities.  If you were told in the past that you have  high risk for cardiovascular diseases, you may seek evaluation by your heart doctor prior to initiating moderate to intense exercise programs.   Handouts Provided Include  Lifestyle Medicine Meal Plan plant based Meal Plan card   Learning Style & Readiness for Change Teaching method utilized: Visual & Auditory  Demonstrated degree of understanding via: Teach Back  Barriers to learning/adherence to lifestyle change: none  Goals Established by Pt  Goals  Keep up the great job Focus on whole plant based foods Increase exercise to 150 minutes or more weekly. Drink a gallon of water per day Lose 1 lb per week. Get a1C 5.7% or less.     MONITORING & EVALUATION Dietary intake, weekly physical activity, and blood sugars  in 3 month.  Next Steps  Patient is to work on consistent meals and better meal planning and exercise.Marland Kitchen

## 2022-04-06 NOTE — Patient Instructions (Signed)
Goals  Keep up the great job Focus on whole plant based foods Increase exercise to 150 minutes or more weekly. Drink a gallon of water per day Lose 1 lb per week. Get a1C 5.7% or less.

## 2022-06-05 ENCOUNTER — Ambulatory Visit: Payer: Medicaid Other | Admitting: Gastroenterology

## 2022-07-05 ENCOUNTER — Encounter: Payer: Medicaid Other | Attending: Gastroenterology | Admitting: Nutrition

## 2022-07-05 ENCOUNTER — Encounter: Payer: Self-pay | Admitting: Nutrition

## 2022-07-05 VITALS — Ht 65.0 in | Wt 209.0 lb

## 2022-07-05 DIAGNOSIS — Z6838 Body mass index (BMI) 38.0-38.9, adult: Secondary | ICD-10-CM | POA: Insufficient documentation

## 2022-07-05 DIAGNOSIS — E1169 Type 2 diabetes mellitus with other specified complication: Secondary | ICD-10-CM | POA: Diagnosis present

## 2022-07-05 DIAGNOSIS — E119 Type 2 diabetes mellitus without complications: Secondary | ICD-10-CM | POA: Insufficient documentation

## 2022-07-05 DIAGNOSIS — E785 Hyperlipidemia, unspecified: Secondary | ICD-10-CM | POA: Diagnosis present

## 2022-07-05 NOTE — Patient Instructions (Addendum)
Goals  Get weight down to less than 200's. Cut out junk food Increase plant based food and try new recipes to include beans, grains and low carb veggies Increase activity. Smoke only 5 cigs a day. 1-800 quit now.

## 2022-07-05 NOTE — Progress Notes (Signed)
Medical Nutrition Therapy  Appointment Start time:  0258  [id number] End time:1145  Primary concerns today: Obesity, DM Type 2  Referral diagnosis: E11.8, E66.9 Preferred learning style: No preference . Learning readiness: Ready   NUTRITION ASSESSMENT  25 y old female with Type 2 DM and Obesity. Currently only on Trulicity weekly. PCP: Demetrius Revel,  GI Aliene Altes Smoking 10 cig a day, down from a pack a day.   A1C down to 6%. Lost 5 lbs since last visit. Has been walking some. BMI 37. Has been tracking her food and has been eating more fruits and vegetables. Feels good. Has cut out eating out, and got rid of junk food.  FBS 106-118 mg/dl, before bed 110-150's.  She is making good progress and seems committed.  Anthropometrics  Wt Readings from Last 3 Encounters:  07/05/22 209 lb (94.8 kg)  04/06/22 219 lb 6.4 oz (99.5 kg)  03/08/22 224 lb 9.6 oz (101.9 kg)   Ht Readings from Last 3 Encounters:  07/05/22 '5\' 5"'$  (1.651 m)  04/06/22 '5\' 5"'$  (1.651 m)  03/08/22 '5\' 5"'$  (1.651 m)   Body mass index is 34.78 kg/m. '@BMIFA'$ @ Facility age limit for growth %iles is 20 years. Facility age limit for growth %iles is 20 years.    Clinical Medical Hx: See chart Medications: Truliciity Labs: HIghest A1C 7.5% down to 6.5%  Lab Results  Component Value Date   HGBA1C 6.5 (H) 11/03/2021      Latest Ref Rng & Units 02/09/2022    1:38 PM 11/03/2021    8:44 AM 05/04/2021   10:49 AM  CMP  Glucose 70 - 99 mg/dL  129    BUN 6 - 24 mg/dL  11    Creatinine 0.57 - 1.00 mg/dL  0.64    Sodium 134 - 144 mmol/L  139    Potassium 3.5 - 5.2 mmol/L  5.1    Chloride 96 - 106 mmol/L  99    CO2 20 - 29 mmol/L  24    Calcium 8.7 - 10.2 mg/dL  10.3  10.1   Total Protein 6.1 - 8.1 g/dL 6.6  7.1    Total Bilirubin 0.2 - 1.2 mg/dL 0.4  0.6    Alkaline Phos 44 - 121 IU/L  90    AST 10 - 35 U/L 23  29    ALT 6 - 29 U/L 37  47      Notable Signs/Symptoms: None  Lifestyle & Dietary Hx Has a  roommate, she cooks and shops for herself. Eats 2 meals, skips lunch. Dx 5+years ago.   Estimated daily fluid intake 16-24 oz Supplements: MVI, Omega Fish Oil, Vit D Sleep: 8 Stress / self-care: none Current average weekly physical activity:  ADL 24-Hr Dietary Recall First Meal: yogurt and cherrios  c, water Snack:  Second Meal:   tunafish, 6 crackers,  Third Meal: Vegetables and water and beans, Estimated Energy Needs Calories: 1200 Carbohydrate: 135g Protein: 90g Fat: 33g   NUTRITION DIAGNOSIS  NB-1.2 Harmful beliefs/attitudes about food or nutrition-related topics (use with caution) As related to DIabetes Type 2.  As evidenced by A1C 6.5%..   NUTRITION INTERVENTION  Nutrition education (E-1) on the following topics:  Nutrition and Diabetes education provided on My Plate, CHO counting, meal planning, portion sizes, timing of meals, avoiding snacks between meals unless having a low blood sugar, target ranges for A1C and blood sugars, signs/symptoms and treatment of hyper/hypoglycemia, monitoring blood sugars, taking medications as prescribed, benefits of exercising  30 minutes per day and prevention of complications of DM.  Lifestyle Medicine  - Whole Food, Plant Predominant Nutrition is highly recommended: Eat Plenty of vegetables, Mushrooms, fruits, Legumes, Whole Grains, Nuts, seeds in lieu of processed meats, processed snacks/pastries red meat, poultry, eggs.    -It is better to avoid simple carbohydrates including: Cakes, Sweet Desserts, Ice Cream, Soda (diet and regular), Sweet Tea, Candies, Chips, Cookies, Store Bought Juices, Alcohol in Excess of  1-2 drinks a day, Lemonade,  Artificial Sweeteners, Doughnuts, Coffee Creamers, "Sugar-free" Products, etc, etc.  This is not a complete list.....  Exercise: If you are able: 30 -60 minutes a day ,4 days a week, or 150 minutes a week.  The longer the better.  Combine stretch, strength, and aerobic activities.  If you were told in  the past that you have high risk for cardiovascular diseases, you may seek evaluation by your heart doctor prior to initiating moderate to intense exercise programs.   Handouts Provided Include  Lifestyle Medicine Meal Plan plant based Meal Plan card   Learning Style & Readiness for Change Teaching method utilized: Visual & Auditory  Demonstrated degree of understanding via: Teach Back  Barriers to learning/adherence to lifestyle change: none  Goals Established by Pt  Goals  Keep up the great job Focus on whole plant based foods Increase exercise to 150 minutes or more weekly. Drink a gallon of water per day Lose 1 lb per week. Get a1C 5.7% or less.     MONITORING & EVALUATION Dietary intake, weekly physical activity, and blood sugars  in 3 month.  Next Steps  Patient is to work on consistent meals and better meal planning and exercise.Marland Kitchen

## 2022-07-11 ENCOUNTER — Encounter: Payer: Self-pay | Admitting: Nutrition

## 2022-07-20 ENCOUNTER — Other Ambulatory Visit: Payer: Self-pay

## 2022-07-20 ENCOUNTER — Encounter (HOSPITAL_COMMUNITY): Payer: Self-pay | Admitting: Emergency Medicine

## 2022-07-20 ENCOUNTER — Emergency Department (HOSPITAL_COMMUNITY)
Admission: EM | Admit: 2022-07-20 | Discharge: 2022-07-20 | Discharge status: Against Medical Advice | Payer: Medicaid Other | Attending: Emergency Medicine | Admitting: Emergency Medicine

## 2022-07-20 DIAGNOSIS — M5442 Lumbago with sciatica, left side: Secondary | ICD-10-CM | POA: Insufficient documentation

## 2022-07-20 DIAGNOSIS — Z5321 Procedure and treatment not carried out due to patient leaving prior to being seen by health care provider: Secondary | ICD-10-CM | POA: Diagnosis not present

## 2022-07-20 DIAGNOSIS — Y92410 Unspecified street and highway as the place of occurrence of the external cause: Secondary | ICD-10-CM | POA: Diagnosis not present
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
## 2022-07-21 ENCOUNTER — Encounter: Payer: Self-pay | Admitting: Emergency Medicine

## 2022-07-21 ENCOUNTER — Ambulatory Visit (INDEPENDENT_AMBULATORY_CARE_PROVIDER_SITE_OTHER): Payer: Medicaid Other

## 2022-07-21 ENCOUNTER — Ambulatory Visit
Admission: EM | Admit: 2022-07-21 | Discharge: 2022-07-21 | Disposition: A | Discharge status: Home / Self Care | Payer: Medicaid Other | Attending: Emergency Medicine | Admitting: Emergency Medicine

## 2022-07-21 DIAGNOSIS — M545 Low back pain, unspecified: Secondary | ICD-10-CM

## 2022-07-21 DIAGNOSIS — M5442 Lumbago with sciatica, left side: Secondary | ICD-10-CM

## 2022-07-21 MED ORDER — KETOROLAC TROMETHAMINE 60 MG/2ML IM SOLN
60.0000 mg | Freq: Once | INTRAMUSCULAR | Status: AC
Start: 2022-07-21 — End: 2022-07-21
  Administered 2022-07-21: 60 mg via INTRAMUSCULAR

## 2022-07-21 MED ORDER — IBUPROFEN 800 MG PO TABS: 800.0000 mg | ORAL_TABLET | Freq: Three times a day (TID) | ORAL | 0 refills | Status: DC
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
## 2022-07-21 NOTE — Discharge Instructions (Addendum)
Take Motrin and Tylenol as needed for pain ice to buttocks and lower back IcyHot can help with the lower back muscle pain.  Continue your Flexeril as needed and follow-up with your primary care provider for reevaluation in a couple of weeks if not improving return at any time if new or worsening symptoms for reevaluation
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
## 2022-07-31 NOTE — Progress Notes (Unsigned)
Referring Provider: No ref. provider found Primary Care Physician:  Noreene Larsson, NP Primary GI Physician: Dr. Abbey Chatters  No chief complaint on file.   HPI:   Michaela Aguilar is a 56 y.o. adult with history of diabetes, HTN, HLD, presenting today for follow-up of elevated ALT.  Last seen in our office 01/25/2022 for elevated ALT and positive Cologuard. She reported reported single episode of toilet tissue hematochezia in the setting of constipation. No recurrent symptoms.  No other significant GI symptoms.  Reported occasional alcohol use, drinking green tea.  No drug use, herbal teas or supplements, Tylenol, or family history of liver disease. She was scheduled for colonoscopy, labs completed to evaluate elevated ALT as well as ultrasound, referred to healthy weight and wellness.  Recommended avoiding alcohol for now.  Labs revealed ALT slightly elevated at 37, otherwise LFTs within normal limits.  Iron panel normal.  Hep B serologies negative without immunity.  Hep C negative.  Abdominal ultrasound with gallstones, prominence of proximal CBD measuring 8.6 mm.  Normal-appearing liver.  Follow-up MRI/MRCP 03/01/2022 with hepatic steatosis, mildly dilated CBD measuring 8 mm with smooth tapering, no filling defects.  Stated mild tapering could suggest mild stricture of distal CBD. Evidence of biliary sludge and tiny gallstones without cholecystitis. Left benign adrenal adenoma.   Colonoscopy 02/24/2022: Nonbleeding internal hemorrhoids.  4 mm polyp in the cecum removed.  Pathology with tubular adenoma.  Recommended 5-year surveillance.  Today:     Wt Readings from Last 7 Encounters:  07/20/22 202 lb (91.6 kg)  07/05/22 209 lb (94.8 kg)  04/06/22 219 lb 6.4 oz (99.5 kg)  03/08/22 224 lb 9.6 oz (101.9 kg)  02/24/22 222 lb 10.6 oz (101 kg)  01/25/22 228 lb 9.6 oz (103.7 kg)  12/08/21 227 lb 3.2 oz (103.1 kg)     Past Medical History:  Diagnosis Date   Anxiety    Asthma    Bronchitis     Chronic pain of left knee 04/03/2017   Chronic pain of right knee 08/16/2017   Depression    Encounter for screening for malignant neoplasm of cervix 11/08/2019   Encounter for screening for malignant neoplasm of colon 11/08/2019   Encounter for screening mammogram for malignant neoplasm of breast 11/08/2019   Gallstones    Gestational diabetes    1992   History of kidney stones    Hypertension    Low back pain 11/17/2015   Neck pain 11/17/2015    Past Surgical History:  Procedure Laterality Date   CESAREAN SECTION     x2   COLONOSCOPY WITH PROPOFOL N/A 02/24/2022   Procedure: COLONOSCOPY WITH PROPOFOL;  Surgeon: [name], DO;  Location: [facility];  Service: Endoscopy;  Laterality: N/A;  8:30am   CYSTOSCOPY WITH RETROGRADE PYELOGRAM, URETEROSCOPY AND STENT PLACEMENT Right 08/18/2019   Procedure: CYSTOSCOPY WITH RETROGRADE PYELOGRAM, URETEROSCOPY AND STENT PLACEMENT;  Surgeon: Cleon Gustin, MD;  Location: [facility];  Service: Urology;  Laterality: Right;   CYSTOSCOPY WITH STENT PLACEMENT Right 09/06/2016   Procedure: CYSTOSCOPY WITH RIGHT URETERAL STENT PLACEMENT;  Surgeon: Cleon Gustin, MD;  Location: [facility];  Service: Urology;  Laterality: Right;   CYSTOSCOPY/RETROGRADE/URETEROSCOPY/STONE EXTRACTION WITH BASKET Right 09/06/2016   Procedure: CYSTOSCOPY/RIGHT RETROGRADE PYELOGRAM, RIGHT RENAL STONE EXTRACTION WITH LASER;  Surgeon: Cleon Gustin, MD;  Location: [facility];  Service: Urology;  Laterality: Right;   [place] 2010   HERNIA REPAIR  incisional hernia   STONE EXTRACTION WITH BASKET Right 08/18/2019   Procedure: STONE EXTRACTION WITH BASKET;  Surgeon: Cleon Gustin, MD;  Location: [facility];  Service: Urology;  Laterality: Right;    Current Outpatient Medications  Medication Sig Dispense Refill   acetaminophen (TYLENOL) 500 MG tablet Take 1,000 mg by mouth every 8 (eight) hours as needed for moderate pain.      albuterol (VENTOLIN HFA) 108 (90 Base) MCG/ACT inhaler INHALE 2 PUFFS INTO LUNGS EVERY 6 HOURS AS NEEDED. 18 g 0   atorvastatin (LIPITOR) 40 MG tablet Take 1 tablet (40 mg total) by mouth daily. 90 tablet 3   Biotin 10000 MCG TABS Take 10,000 mcg by mouth daily.     blood glucose meter kit and supplies Dispense based on patient and insurance preference. Use up to four times daily as directed. (FOR ICD-10 E10.9, E11.9). 1 each 0   cyclobenzaprine (FLEXERIL) 10 MG tablet TAKE (1) TABLET BY MOUTH TWICE DAILY AS NEEDED FOR MUSCLE SPASMS. (Patient taking differently: Take 10 mg by mouth every evening.) 20 tablet 0   Dulaglutide (TRULICITY) 1.5 SR/1.5XY SOPN Inject 1.5 mg into the skin once a week. (Patient taking differently: Inject 1.5 mg into the skin every Monday.) 6 mL 1   famotidine (PEPCID) 20 MG tablet TAKE (1) TABLET BY MOUTH TWICE DAILY. 30 tablet 0   ibuprofen (ADVIL) 800 MG tablet Take 1 tablet (800 mg total) by mouth 3 (three) times daily. 21 tablet 0   Insulin Pen Needle (PEN NEEDLES) 32G X 5 MM MISC Use to inject ozempic weekly 50 each 0   lisinopril (ZESTRIL) 10 MG tablet Take 1 tablet (10 mg total) by mouth daily. 90 tablet 3   Multiple Vitamins-Minerals (MULTIVITAMIN WITH MINERALS) tablet Take 1 tablet by mouth daily.     Omega-3 Fatty Acids (FISH OIL) 1000 MG CAPS Take 1,000 mg by mouth in the morning and at bedtime.     Vitamin D, Ergocalciferol, (DRISDOL) 1.25 MG (50000 UNIT) CAPS capsule TAKE 1 TABLET WEEKLY. (SAME DAY EACH WEEK) (Patient taking differently: Take 50,000 Units by mouth every Monday. TAKE 1 TABLET WEEKLY. (SAME DAY EACH WEEK)) 12 capsule 0   No current facility-administered medications for this visit.    Allergies as of 08/02/2022   (No Known Allergies)    Family History  Problem Relation Age of Onset   Osteoporosis Mother    Depression Father    Mental illness Father    Emphysema Maternal Grandmother    Cancer Maternal Grandfather        lung cancer    Cancer Maternal Uncle        lung cancer   COPD Maternal Uncle    Depression Maternal Uncle    Breast cancer Neg Hx    Colon cancer Neg Hx     Social History   Socioeconomic History   Marital status: Divorced    Spouse name: Not on file   Number of children: 2   Years of education: Not on file   Highest education level: Some college, no degree  Occupational History   Not on file  Tobacco Use   Smoking status: Every Day    Packs/day: 0.50    Years: 15.00    Total pack years: 7.50    Types: Cigarettes   Smokeless tobacco: Never  Vaping Use   Vaping Use: Never used  Substance and Sexual Activity   Alcohol use: Yes    Comment: occ   Drug use:  No    Types: Marijuana    Comment: last marijuana use early October 2017   Sexual activity: Yes    Birth control/protection: Post-menopausal  Other Topics Concern   Not on file  Social History Narrative   Lives with roommate   2 children (grown)   1 daughter no grandbabies    1 son-2 grandbabies 80 month and 56 year old (daughters)   2 cats: Bella and Tip       Enjoys: swim and ride bike enjoys being outside       Diet:eats all food groups, enjoys bread-air frying more, needs more veggies    Caffeine: soda-sugar free- one daily,    Water: 3-4 cups daily       Wears seat belt   Smoke detectors    Does not use phone while driving    Social Determinants of Health   Financial Resource Strain: Low Risk  (12/08/2021)   Overall Financial Resource Strain (CARDIA)    Difficulty of Paying Living Expenses: Not very hard  Food Insecurity: Food Insecurity Present (12/08/2021)   Hunger Vital Sign    Worried About Running Out of Food in the Last Year: Sometimes true    Ran Out of Food in the Last Year: Never true  Transportation Needs: No Transportation Needs (12/08/2021)   PRAPARE - Hydrologist (Medical): No    Lack of Transportation (Non-Medical): No  Physical Activity: Sufficiently Active (12/08/2021)    Exercise Vital Sign    Days of Exercise per Week: 3 days    Minutes of Exercise per Session: 50 min  Stress: No Stress Concern Present (12/08/2021)   [place]    Feeling of Stress : Not at all  Social Connections: Moderately Integrated (12/08/2021)   Social Connection and Isolation Panel [NHANES]    Frequency of Communication with Friends and Family: More than three times a week    Frequency of Social Gatherings with Friends and Family: Twice a week    Attends Religious Services: More than 4 times per year    Active Member of [employer] or Organizations: Yes    Attends Archivist Meetings: Never    Marital Status: Divorced    Review of Systems: Gen: Denies fever, chills, cold or flulike symptoms, presyncope, syncope. CV: Denies chest pain, palpitations. Resp: Denies dyspnea, cough. GI: See HPI Heme: See HPI  Physical Exam: LMP 02/16/2014  General:   Alert and oriented. No distress noted. Pleasant and cooperative.  Head:  Normocephalic and atraumatic. Eyes:  Conjuctiva clear without scleral icterus. Heart:  S1, S2 present without murmurs appreciated. Lungs:  Clear to auscultation bilaterally. No wheezes, rales, or rhonchi. No distress.  Abdomen:  +BS, soft, non-tender and non-distended. No rebound or guarding. No HSM or masses noted. Msk:  Symmetrical without gross deformities. Normal posture. Extremities:  Without edema. Neurologic:  Alert and  oriented x4 Psych:  Normal mood and affect.    Assessment:     Plan:  ***   Aliene Altes, PA-C [facility] 08/02/2022

## 2022-08-02 ENCOUNTER — Ambulatory Visit: Payer: Medicaid Other | Admitting: Gastroenterology

## 2022-08-02 ENCOUNTER — Encounter: Payer: Self-pay | Admitting: Gastroenterology

## 2022-08-02 VITALS — BP 120/72 | HR 80 | Temp 98.1°F | Ht 65.0 in | Wt 204.8 lb

## 2022-08-02 DIAGNOSIS — K760 Fatty (change of) liver, not elsewhere classified: Secondary | ICD-10-CM

## 2022-08-02 DIAGNOSIS — K838 Other specified diseases of biliary tract: Secondary | ICD-10-CM

## 2022-08-02 DIAGNOSIS — R7401 Elevation of levels of liver transaminase levels: Secondary | ICD-10-CM | POA: Diagnosis not present

## 2022-08-02 NOTE — Patient Instructions (Addendum)
Please have blood work completes at [employer] [facility] when you return from vacation next week.  I would like for you to be fasting (nothing to eat or drink after midnight).  Congratulations on your weight loss thus far!  Keep up the good work!  General instructions for fatty liver: Recommend 1-2# weight loss per week until ideal body weight through exercise & diet. Low fat/cholesterol diet.   Avoid sweets, sodas, fruit juices, sweetened beverages like tea, etc. Gradually increase exercise from 15 min daily up to 1 hr per day 5 days/week. Limit alcohol use.  Continue to limit Tylenol and avoid over-the-counter supplements and herbal teas.  We will plan to see you back in 6 months or sooner if needed.   It was great to see you again today!  Aliene Altes, PA-C [facility]

## 2022-08-07 ENCOUNTER — Telehealth: Payer: Self-pay | Admitting: Gastroenterology

## 2022-08-07 NOTE — Telephone Encounter (Signed)
Dilated CBD with smooth tapering reviewed with Dr. Abbey Chatters who recommended review with Dr. Rush Landmark. Reviewed with Dr. Rush Landmark who stated EUS would be appropriate to rule out micro choledocholithiasis and/or ampullary lesion. Tried calling patient this morning to discuss. [facility] requesting return call.   Michaela Aguilar, if patient calls back, please let me know and ask for a good time for me to return her call to discuss recommendations.

## 2022-08-08 NOTE — Telephone Encounter (Signed)
Spoke with patient. She is agreeable to proceed with EUS with Dr. Rush Landmark. She did tell me that she just tested positive for COVID a couple days ago. She is starting feel better, but will need to have procedure completed no sooner than a couple of weeks from now. Advised that this was not urgent and this would be no problem. I will update Dr. Rush Landmark and his team will reach her to arrange procedure.

## 2022-08-09 NOTE — Telephone Encounter (Signed)
Noted  

## 2022-08-14 ENCOUNTER — Other Ambulatory Visit: Payer: Self-pay

## 2022-08-14 DIAGNOSIS — K838 Other specified diseases of biliary tract: Secondary | ICD-10-CM

## 2022-08-14 NOTE — Telephone Encounter (Signed)
EUS scheduled for 08/29/22 at 730 am with GM at [facility]

## 2022-08-14 NOTE — Telephone Encounter (Signed)
Left message on machine to call back  

## 2022-08-15 NOTE — Telephone Encounter (Signed)
Left message on machine to call back  

## 2022-08-16 NOTE — Telephone Encounter (Signed)
EUS scheduled, pt instructed and medications reviewed.  Patient instructions mailed to home and sent to My Chart .  Patient to call with any questions or concerns.  

## 2022-08-21 ENCOUNTER — Telehealth: Payer: Self-pay | Admitting: *Deleted

## 2022-08-21 DIAGNOSIS — R1084 Generalized abdominal pain: Secondary | ICD-10-CM

## 2022-08-21 DIAGNOSIS — R112 Nausea with vomiting, unspecified: Secondary | ICD-10-CM

## 2022-08-21 NOTE — Telephone Encounter (Signed)
Referral sent 

## 2022-08-21 NOTE — Telephone Encounter (Signed)
Spoke with patient.  She reports symptom started after eating spaghetti yesterday.  She is now feeling improved.  Mild residual soreness, but no nausea, vomiting today.  Eating well without any worsening symptoms.  No fever.  She may very well experience an episode of biliary colic.  She has known gallstones.  Recommended referral to general surgery to consider cholecystectomy.  Also recommended avoiding fried, fatty, greasy foods for now.  If symptoms return, are severe, or persist for several hours, she is to proceed to the emergency room.   Mindy/Tammy:  Please place referral to general surgery. DX: Cholelithiasis, postprandial abdominal pain, nausea, vomiting

## 2022-08-21 NOTE — Telephone Encounter (Signed)
Pt called and states she is have pain under her right breast area. Would like to know if this could be gallstones.

## 2022-08-21 NOTE — Addendum Note (Signed)
Addended by: Madelin Rear on: 08/21/2022 03:48 PM   Modules accepted: Orders

## 2022-08-21 NOTE — Telephone Encounter (Signed)
Spoke to pt, she informed me that she was having sever pains under breast yesterday. She also had nausea and vomiting yesterday. Today she is just sore.

## 2022-08-21 NOTE — Telephone Encounter (Signed)
Typical pain from her gallbladder would be under her right rib cage after eating with associated nausea and vomiting.

## 2022-08-22 ENCOUNTER — Encounter (HOSPITAL_COMMUNITY): Payer: Self-pay | Admitting: Gastroenterology

## 2022-08-23 LAB — HEPATIC FUNCTION PANEL
AG Ratio: 1.5 (calc) (ref 1.0–2.5)
ALT: 24 U/L (ref 6–29)
AST: 19 U/L (ref 10–35)
Albumin: 4.3 g/dL (ref 3.6–5.1)
Alkaline phosphatase (APISO): 90 U/L (ref 37–153)
Bilirubin, Direct: 0.1 mg/dL (ref 0.0–0.2)
Globulin: 2.8 g/dL (calc) (ref 1.9–3.7)
Indirect Bilirubin: 0.6 mg/dL (calc) (ref 0.2–1.2)
Total Bilirubin: 0.7 mg/dL (ref 0.2–1.2)
Total Protein: 7.1 g/dL (ref 6.1–8.1)

## 2022-08-29 ENCOUNTER — Ambulatory Visit (HOSPITAL_BASED_OUTPATIENT_CLINIC_OR_DEPARTMENT_OTHER): Payer: Medicaid Other | Admitting: Registered Nurse

## 2022-08-29 ENCOUNTER — Encounter (HOSPITAL_COMMUNITY): Payer: Self-pay | Admitting: Gastroenterology

## 2022-08-29 ENCOUNTER — Ambulatory Visit (HOSPITAL_COMMUNITY): Payer: Medicaid Other | Admitting: Registered Nurse

## 2022-08-29 ENCOUNTER — Other Ambulatory Visit: Payer: Self-pay

## 2022-08-29 ENCOUNTER — Encounter (HOSPITAL_COMMUNITY)
Admission: RE | Disposition: A | Discharge status: Home / Self Care | Payer: Self-pay | Source: Home / Self Care | Attending: Gastroenterology

## 2022-08-29 ENCOUNTER — Ambulatory Visit (HOSPITAL_COMMUNITY)
Admission: RE | Admit: 2022-08-29 | Discharge: 2022-08-29 | Disposition: A | Discharge status: Home / Self Care | Payer: Medicaid Other | Attending: Gastroenterology | Admitting: Gastroenterology

## 2022-08-29 DIAGNOSIS — I10 Essential (primary) hypertension: Secondary | ICD-10-CM | POA: Insufficient documentation

## 2022-08-29 DIAGNOSIS — K2970 Gastritis, unspecified, without bleeding: Secondary | ICD-10-CM | POA: Insufficient documentation

## 2022-08-29 DIAGNOSIS — K449 Diaphragmatic hernia without obstruction or gangrene: Secondary | ICD-10-CM | POA: Diagnosis not present

## 2022-08-29 DIAGNOSIS — E119 Type 2 diabetes mellitus without complications: Secondary | ICD-10-CM | POA: Diagnosis not present

## 2022-08-29 DIAGNOSIS — R1011 Right upper quadrant pain: Secondary | ICD-10-CM | POA: Insufficient documentation

## 2022-08-29 DIAGNOSIS — K3189 Other diseases of stomach and duodenum: Secondary | ICD-10-CM

## 2022-08-29 DIAGNOSIS — F17210 Nicotine dependence, cigarettes, uncomplicated: Secondary | ICD-10-CM | POA: Diagnosis not present

## 2022-08-29 DIAGNOSIS — J45909 Unspecified asthma, uncomplicated: Secondary | ICD-10-CM | POA: Insufficient documentation

## 2022-08-29 DIAGNOSIS — R7989 Other specified abnormal findings of blood chemistry: Secondary | ICD-10-CM | POA: Insufficient documentation

## 2022-08-29 DIAGNOSIS — K8020 Calculus of gallbladder without cholecystitis without obstruction: Secondary | ICD-10-CM

## 2022-08-29 DIAGNOSIS — R935 Abnormal findings on diagnostic imaging of other abdominal regions, including retroperitoneum: Secondary | ICD-10-CM | POA: Insufficient documentation

## 2022-08-29 DIAGNOSIS — J449 Chronic obstructive pulmonary disease, unspecified: Secondary | ICD-10-CM

## 2022-08-29 DIAGNOSIS — E669 Obesity, unspecified: Secondary | ICD-10-CM | POA: Insufficient documentation

## 2022-08-29 DIAGNOSIS — R1013 Epigastric pain: Secondary | ICD-10-CM | POA: Diagnosis not present

## 2022-08-29 DIAGNOSIS — R945 Abnormal results of liver function studies: Secondary | ICD-10-CM | POA: Insufficient documentation

## 2022-08-29 DIAGNOSIS — K222 Esophageal obstruction: Secondary | ICD-10-CM | POA: Insufficient documentation

## 2022-08-29 DIAGNOSIS — K2289 Other specified disease of esophagus: Secondary | ICD-10-CM

## 2022-08-29 DIAGNOSIS — K838 Other specified diseases of biliary tract: Secondary | ICD-10-CM

## 2022-08-29 DIAGNOSIS — Z6832 Body mass index (BMI) 32.0-32.9, adult: Secondary | ICD-10-CM | POA: Insufficient documentation

## 2022-08-29 DIAGNOSIS — K8010 Calculus of gallbladder with chronic cholecystitis without obstruction: Secondary | ICD-10-CM | POA: Insufficient documentation

## 2022-08-29 DIAGNOSIS — R590 Localized enlarged lymph nodes: Secondary | ICD-10-CM | POA: Insufficient documentation

## 2022-08-29 HISTORY — PX: BIOPSY: SHX5522

## 2022-08-29 HISTORY — PX: ESOPHAGOGASTRODUODENOSCOPY: SHX5428

## 2022-08-29 HISTORY — PX: EUS: SHX5427

## 2022-08-29 LAB — GLUCOSE, CAPILLARY: Glucose-Capillary: 96 mg/dL (ref 70–99)

## 2022-08-29 SURGERY — UPPER ENDOSCOPIC ULTRASOUND (EUS) RADIAL
Anesthesia: Monitor Anesthesia Care

## 2022-08-29 MED ORDER — ONDANSETRON HCL 4 MG/2ML IJ SOLN
INTRAMUSCULAR | Status: DC | PRN
  Administered 2022-08-29: 4 mg via INTRAVENOUS

## 2022-08-29 MED ORDER — PROPOFOL 10 MG/ML IV BOLUS
INTRAVENOUS | Status: DC | PRN
  Administered 2022-08-29: 80 mg via INTRAVENOUS
  Administered 2022-08-29: 60 mg via INTRAVENOUS

## 2022-08-29 MED ORDER — LACTATED RINGERS IV SOLN
INTRAVENOUS | Status: DC
  Administered 2022-08-29: 1000 mL via INTRAVENOUS

## 2022-08-29 MED ORDER — PROPOFOL 1000 MG/100ML IV EMUL
INTRAVENOUS | Status: AC
  Filled 2022-08-29: qty 200

## 2022-08-29 MED ORDER — PHENYLEPHRINE 80 MCG/ML (10ML) SYRINGE FOR IV PUSH (FOR BLOOD PRESSURE SUPPORT)
PREFILLED_SYRINGE | INTRAVENOUS | Status: DC | PRN
  Administered 2022-08-29: 160 ug via INTRAVENOUS

## 2022-08-29 MED ORDER — LIDOCAINE 2% (20 MG/ML) 5 ML SYRINGE
INTRAMUSCULAR | Status: DC | PRN
  Administered 2022-08-29: 100 mg via INTRAVENOUS

## 2022-08-29 MED ORDER — PROPOFOL 500 MG/50ML IV EMUL
INTRAVENOUS | Status: DC | PRN
  Administered 2022-08-29: 150 ug/kg/min via INTRAVENOUS

## 2022-08-29 MED ORDER — SODIUM CHLORIDE 0.9 % IV SOLN: INTRAVENOUS | Status: DC

## 2022-08-29 NOTE — Anesthesia Procedure Notes (Signed)
Procedure Name: MAC Date/Time: 08/29/2022 12:48 AM  Performed by: Talbot Grumbling, CRNAPre-anesthesia Checklist: Patient identified, Suction available, Emergency Drugs available and Patient being monitored Patient Re-evaluated:Patient Re-evaluated prior to induction Oxygen Delivery Method: Simple face mask

## 2022-08-29 NOTE — Discharge Instructions (Signed)
YOU HAD AN ENDOSCOPIC PROCEDURE TODAY: Refer to the procedure report and other information in the discharge instructions given to you for any specific questions about what was found during the examination. If this information does not answer your questions, please call [place] office at [phone] to clarify.   YOU SHOULD EXPECT: Some feelings of bloating in the abdomen. Passage of more gas than usual. Walking can help get rid of the air that was put into your GI tract during the procedure and reduce the bloating. If you had a lower endoscopy (such as a colonoscopy or flexible sigmoidoscopy) you may notice spotting of blood in your stool or on the toilet paper. Some abdominal soreness may be present for a day or two, also.  DIET: Your first meal following the procedure should be a light meal and then it is ok to progress to your normal diet. A half-sandwich or bowl of soup is an example of a good first meal. Heavy or fried foods are harder to digest and may make you feel nauseous or bloated. Drink plenty of fluids but you should avoid alcoholic beverages for 24 hours. If you had a esophageal dilation, please see attached instructions for diet.    ACTIVITY: Your care partner should take you home directly after the procedure. You should plan to take it easy, moving slowly for the rest of the day. You can resume normal activity the day after the procedure however YOU SHOULD NOT DRIVE, use power tools, machinery or perform tasks that involve climbing or major physical exertion for 24 hours (because of the sedation medicines used during the test).   SYMPTOMS TO REPORT IMMEDIATELY: A gastroenterologist can be reached at any hour. Please call [phone]  for any of the following symptoms:   Following upper endoscopy (EGD, EUS, ERCP, esophageal dilation) Vomiting of blood or coffee ground material  New, significant abdominal pain  New, significant chest pain or pain under the shoulder blades  Painful or  persistently difficult swallowing  New shortness of breath  Black, tarry-looking or red, bloody stools  FOLLOW UP:  If any biopsies were taken you will be contacted by phone or by letter within the next 1-3 weeks. Call [phone]  if you have not heard about the biopsies in 3 weeks.  Please also call with any specific questions about appointments or follow up tests.  

## 2022-08-29 NOTE — Op Note (Signed)
[facility] Patient Name: Michaela Aguilar Procedure Date: 08/29/2022 MRN: [id number] Attending MD: Justice Britain , MD Date of Birth: 1966-07-02 CSN: [id number] Age: 56 Admit Type: Outpatient Procedure:                Upper EUS Indications:              Common bile duct dilation (acquired) seen on MRCP,                            Abnormal liver function test, Epigastric abdominal                            pain, Abdominal pain in the right upper quadrant Providers:                Justice Britain, MD, Burtis Junes, RN, Darliss Cheney,                            Technician Referring MD:             Elon Alas. Abbey Chatters, DO, Flint Melter Pappayliou DO,                            Noreene Larsson Medicines:                Monitored Anesthesia Care Complications:            No immediate complications. Estimated Blood Loss:     Estimated blood loss was minimal. Procedure:                Pre-Anesthesia Assessment:                           - Prior to the procedure, a History and Physical                            was performed, and patient medications and                            allergies were reviewed. The patient's tolerance of                            previous anesthesia was also reviewed. The risks                            and benefits of the procedure and the sedation                            options and risks were discussed with the patient.                            All questions were answered, and informed consent                            was obtained. Prior Anticoagulants: The patient has  taken no previous anticoagulant or antiplatelet                            agents except for NSAID medication. ASA Grade                            Assessment: III - A patient with severe systemic                            disease. After reviewing the risks and benefits,                            the patient was deemed in satisfactory condition to                             undergo the procedure.                           After obtaining informed consent, the endoscope was                            passed under direct vision. Throughout the                            procedure, the patient's blood pressure, pulse, and                            oxygen saturations were monitored continuously. The                            [id number]-H190 ([id number]) Olympus endoscope was introduced                            through the mouth, and advanced to the second part                            of duodenum. The Endoscope was introduced through                            the mouth, and advanced to the. The [id number]-[id number]                            ([id number]) Olympus duodenoscope was introduced                            through the mouth, and advanced to the area of                            papilla. The [id number] ([id number]) Olympus linear                            ultrasound scope was introduced through the mouth,  and advanced to the duodenum for ultrasound                            examination from the stomach and duodenum. The                            upper EUS was accomplished without difficulty. The                            patient tolerated the procedure. Scope In: Scope Out: Findings:      ENDOSCOPIC FINDING: :      No gross lesions were noted in the entire esophagus.      A widely patent Schatzki ring was found at the gastroesophageal junction.      The Z-line was irregular and was found 39 cm from the incisors.      A 1 cm hiatal hernia was present.      A medium amount of food (residue) was found in the gastric body.      Patchy mildly erythematous mucosa without bleeding was found in the       entire examined stomach. Biopsies were taken with a cold forceps for       histology and Helicobacter pylori testing.      No gross lesions were noted in the duodenal bulb, in the first portion       of the duodenum and in  the second portion of the duodenum.      The major papilla was normal.      ENDOSONOGRAPHIC FINDING: :      There was a smooth dilation proximally of the common bile duct (2.0 mm       to 4.0 mm to 8.0 mm) and in the common hepatic duct (8.5 mm). There is       no evidence of any stones within the bile duct itself.      Multiple stones were visualized endosonographically in the gallbladder.       The stones were irregular. They were hyperechoic and characterized by       shadowing.      There was no sign of significant endosonographic abnormality in the       pancreatic head (1.7 mm), genu of the pancreas (1.7 mm), pancreatic body       (1.5 mm) and pancreatic tail (1.0 mm). No masses, no cysts, the       pancreatic duct was thin in caliber.      Endosonographic imaging of the ampulla showed no intramural       (subepithelial) lesion.      There was abnormal echotexture in the visualized portion of the left       adrenal gland. This abnormality was characterized by a hypoechoic       appearance. See previous MRI for concern of benign adrenal adenoma      One enlarged lymph node was visualized in the porta hepatis region. It       measured 7 mm by 9 mm in maximal cross-sectional diameter. The node was       triangular, isoechoic and had well defined margins.      Endosonographic imaging in the visualized portion of the liver showed no       mass.      The celiac region was visualized. Impression:  EGD impression:                           - No gross lesions in esophagus. Widely patent                            Schatzki ring. Z-line irregular, 39 cm from the                            incisors.                           - 1 cm hiatal hernia.                           - A medium amount of food (residue) in the stomach.                           - Erythematous mucosa in the stomach. Biopsied.                           - No gross lesions in the duodenal bulb, in the                             first portion of the duodenum and in the second                            portion of the duodenum.                           - Normal major papilla.                           EUS impression:                           - There was a smooth dilation of the common bile                            duct proximally and in the common hepatic duct.                            There was no evidence of any stones or sludge or                            debris within the bile duct itself. No evidence of                            any stricturing was noted.                           - Multiple stones were visualized                            endosonographically in the gallbladder.                           -  There was no sign of significant pathology in the                            pancreatic head, genu of the pancreas, pancreatic                            body and pancreatic tail.                           - There was abnormal echotexture in the left                            adrenal gland. This was characterized by a                            hypoechoic appearance. See previous MRI/MRCP                            suggestive of benign adrenal adenoma. This was not                            biopsied.                           - One enlarged lymph node was visualized in the                            porta hepatis region. Tissue has not been obtained.                            However, the endosonographic appearance is                            consistent with benign inflammatory changes. Moderate Sedation:      Not Applicable - Patient had care per Anesthesia. Recommendation:           - The patient will be observed post-procedure,                            until all discharge criteria are met.                           - Discharge patient to home.                           - Patient has a contact number available for                            emergencies. The signs and  symptoms of potential                            delayed complications were discussed with the                            patient. Return to  normal activities tomorrow.                            Written discharge instructions were provided to the                            patient.                           - Resume previous diet.                           - Observe patient's clinical course.                           - Patient states she has surgical referral/clinic                            evaluation later this week to consider                            cholecystectomy, seems reasonable at this point for                            that evaluation as her gastritis that was found                            today should not be causing the discomfort she is                            experiencing.                           - Previously identified adrenal gland/adrenal                            adenoma as per MRI/MRCP, did not sample this based                            on the MRI reading suggestive of this being more                            benign in appearance, though if in future there is                            need to further evaluate this or sample this, I                            think there will be an access point for EUS biopsy.                           - Suspect Trulicity is etiology for patient's                            retained gastric contents  but if issues of nausea                            and vomiting were to become more significant,                            recommend primary gastroenterologist team consider                            solid food gastric emptying study.                           - The findings and recommendations were discussed                            with the patient.                           - The findings and recommendations were discussed                            with the designated responsible adult. Procedure Code(s):         --- Professional ---                           [phone], Esophagogastroduodenoscopy, flexible,                            transoral; with endoscopic ultrasound examination                            limited to the esophagus, stomach or duodenum, and                            adjacent structures                           43239, Esophagogastroduodenoscopy, flexible,                            transoral; with biopsy, single or multiple Diagnosis Code(s):        --- Professional ---                           K22.8, Other specified diseases of esophagus                           K22.2, Esophageal obstruction                           K44.9, Diaphragmatic hernia without obstruction or                            gangrene                           K31.89, Other diseases of stomach and duodenum  K83.8, Other specified diseases of biliary tract                           K80.20, Calculus of gallbladder without                            cholecystitis without obstruction                           R59.0, Localized enlarged lymph nodes                           R94.5, Abnormal results of liver function studies                           R10.13, Epigastric pain                           R10.11, Right upper quadrant pain                           R93.5, Abnormal findings on diagnostic imaging of                            other abdominal regions, including retroperitoneum CPT copyright 2019 [employer]. All rights reserved. The codes documented in this report are preliminary and upon coder review may  be revised to meet current compliance requirements. Justice Britain, MD 08/29/2022 8:33:45 AM Number of Addenda: 0

## 2022-08-29 NOTE — Anesthesia Preprocedure Evaluation (Signed)
Anesthesia Evaluation  Patient identified by MRN, date of birth, ID band Patient awake    Reviewed: Allergy & Precautions, NPO status , Patient's Chart, lab work & pertinent test results  Airway Mallampati: II  TM Distance: >3 FB Neck ROM: Full    Dental no notable dental hx.    Pulmonary asthma , COPD, Current Smoker and Patient abstained from smoking.,    Pulmonary exam normal breath sounds clear to auscultation       Cardiovascular hypertension, Pt. on medications negative cardio ROS Normal cardiovascular exam Rhythm:Regular Rate:Normal     Neuro/Psych Anxiety Depression negative neurological ROS  negative psych ROS   GI/Hepatic negative GI ROS, Neg liver ROS,   Endo/Other  negative endocrine ROSdiabetes, Type 2  Renal/GU negative Renal ROS  negative genitourinary   Musculoskeletal negative musculoskeletal ROS (+)   Abdominal (+) + obese,   Peds negative pediatric ROS (+)  Hematology negative hematology ROS (+)   Anesthesia Other Findings   Reproductive/Obstetrics negative OB ROS                             Anesthesia Physical Anesthesia Plan  ASA: 3  Anesthesia Plan: MAC   Post-op Pain Management: Minimal or no pain anticipated   Induction: Intravenous  PONV Risk Score and Plan: 1 and Ondansetron and Treatment may vary due to age or medical condition  Airway Management Planned: Nasal Cannula  Additional Equipment:   Intra-op Plan:   Post-operative Plan:   Informed Consent: I have reviewed the patients History and Physical, chart, labs and discussed the procedure including the risks, benefits and alternatives for the proposed anesthesia with the patient or authorized representative who has indicated his/her understanding and acceptance.     Dental advisory given  Plan Discussed with: CRNA  Anesthesia Plan Comments:         Anesthesia Quick Evaluation

## 2022-08-29 NOTE — Anesthesia Postprocedure Evaluation (Signed)
Anesthesia Post Note  Patient: Michaela Aguilar  Procedure(s) Performed: UPPER ENDOSCOPIC ULTRASOUND (EUS) RADIAL ESOPHAGOGASTRODUODENOSCOPY (EGD) BIOPSY     Patient location during evaluation: PACU Anesthesia Type: MAC Level of consciousness: awake and alert Pain management: pain level controlled Vital Signs Assessment: post-procedure vital signs reviewed and stable Respiratory status: spontaneous breathing, nonlabored ventilation and respiratory function stable Cardiovascular status: blood pressure returned to baseline and stable Postop Assessment: no apparent nausea or vomiting Anesthetic complications: no   No notable events documented.  Last Vitals:  Vitals:   08/29/22 0836 08/29/22 0846  BP: (!) 122/49 125/75  Pulse: 71 74  Resp: 16 15  Temp:    SpO2: 100% 99%    Last Pain:  Vitals:   08/29/22 0846  TempSrc:   PainSc: 0-No pain                 Lynda Rainwater

## 2022-08-29 NOTE — H&P (Signed)
GASTROENTEROLOGY PROCEDURE H&P NOTE   Primary Care Physician: Noreene Larsson, NP  HPI: Michaela Aguilar is a 56 y.o. adult who presents for EGD/EUS to evaluate dilated bile duct, slightly elevated LFTs, cholelithiasis and rule out choledocholithiasis.  Past Medical History:  Diagnosis Date   Anxiety    Asthma    Bronchitis    Chronic pain of left knee 04/03/2017   Chronic pain of right knee 08/16/2017   Depression    Encounter for screening for malignant neoplasm of cervix 11/08/2019   Encounter for screening for malignant neoplasm of colon 11/08/2019   Encounter for screening mammogram for malignant neoplasm of breast 11/08/2019   Gallstones    Gestational diabetes    1992   History of kidney stones    Hypertension    Low back pain 11/17/2015   Neck pain 11/17/2015   Past Surgical History:  Procedure Laterality Date   CESAREAN SECTION     x2   COLONOSCOPY WITH PROPOFOL N/A 02/24/2022   Surgeon: [name], DO; Nonbleeding internal hemorrhoids.  4 mm polyp in the cecum removed.  Pathology with tubular adenoma.  Recommended 5-year surveillance.   CYSTOSCOPY WITH RETROGRADE PYELOGRAM, URETEROSCOPY AND STENT PLACEMENT Right 08/18/2019   Procedure: CYSTOSCOPY WITH RETROGRADE PYELOGRAM, URETEROSCOPY AND STENT PLACEMENT;  Surgeon: Cleon Gustin, MD;  Location: [facility];  Service: Urology;  Laterality: Right;   CYSTOSCOPY WITH STENT PLACEMENT Right 09/06/2016   Procedure: CYSTOSCOPY WITH RIGHT URETERAL STENT PLACEMENT;  Surgeon: Cleon Gustin, MD;  Location: [facility];  Service: Urology;  Laterality: Right;   CYSTOSCOPY/RETROGRADE/URETEROSCOPY/STONE EXTRACTION WITH BASKET Right 09/06/2016   Procedure: CYSTOSCOPY/RIGHT RETROGRADE PYELOGRAM, RIGHT RENAL STONE EXTRACTION WITH LASER;  Surgeon: Cleon Gustin, MD;  Location: [facility];  Service: Urology;  Laterality: Right;   EXTRACORPOREAL SHOCK WAVE LITHOTRIPSY  1995 2010   HERNIA REPAIR     incisional hernia   STONE  EXTRACTION WITH BASKET Right 08/18/2019   Procedure: STONE EXTRACTION WITH BASKET;  Surgeon: Cleon Gustin, MD;  Location: [facility];  Service: Urology;  Laterality: Right;   Current Facility-Administered Medications  Medication Dose Route Frequency Provider Last Rate Last Admin   0.9 %  sodium chloride infusion   Intravenous Continuous Mansouraty, Telford Nab., MD       lactated ringers infusion   Intravenous Continuous Mansouraty, Telford Nab., MD 10 mL/hr at 08/29/22 0650 Continued from Pre-op at 08/29/22 0650    Current Facility-Administered Medications:    0.9 %  sodium chloride infusion, , Intravenous, Continuous, Mansouraty, Telford Nab., MD   lactated ringers infusion, , Intravenous, Continuous, Mansouraty, Telford Nab., MD, Last Rate: 10 mL/hr at 08/29/22 0650, Continued from Pre-op at 08/29/22 0650 No Known Allergies Family History  Problem Relation Age of Onset   Osteoporosis Mother    Depression Father    Mental illness Father    Emphysema Maternal Grandmother    Cancer Maternal Grandfather        lung cancer   Cancer Maternal Uncle        lung cancer   COPD Maternal Uncle    Depression Maternal Uncle    Breast cancer Neg Hx    Colon cancer Neg Hx    Social History   Socioeconomic History   Marital status: Divorced    Spouse name: Not on file   Number of children: 2   Years of education: Not on file   Highest education level: Some college, no degree  Occupational History  Not on file  Tobacco Use   Smoking status: Every Day    Packs/day: 0.50    Years: 15.00    Total pack years: 7.50    Types: Cigarettes   Smokeless tobacco: Never  Vaping Use   Vaping Use: Never used  Substance and Sexual Activity   Alcohol use: Yes    Comment: occ   Drug use: No    Types: Marijuana    Comment: last marijuana use early October 2017   Sexual activity: Yes    Birth control/protection: Post-menopausal  Other Topics Concern   Not on file  Social History Narrative    Lives with roommate   2 children (grown)   1 daughter no grandbabies    1 son-2 grandbabies 39 month and 4 year old (daughters)   2 cats: Bella and Tip       Enjoys: swim and ride bike enjoys being outside       Diet:eats all food groups, enjoys bread-air frying more, needs more veggies    Caffeine: soda-sugar free- one daily,    Water: 3-4 cups daily       Wears seat belt   Smoke detectors    Does not use phone while driving    Social Determinants of Health   Financial Resource Strain: Low Risk  (12/08/2021)   Overall Financial Resource Strain (CARDIA)    Difficulty of Paying Living Expenses: Not very hard  Food Insecurity: Food Insecurity Present (12/08/2021)   Hunger Vital Sign    Worried About Running Out of Food in the Last Year: Sometimes true    Ran Out of Food in the Last Year: Never true  Transportation Needs: No Transportation Needs (12/08/2021)   PRAPARE - Hydrologist (Medical): No    Lack of Transportation (Non-Medical): No  Physical Activity: Sufficiently Active (12/08/2021)   Exercise Vital Sign    Days of Exercise per Week: 3 days    Minutes of Exercise per Session: 50 min  Stress: No Stress Concern Present (12/08/2021)   [place]    Feeling of Stress : Not at all  Social Connections: Moderately Integrated (12/08/2021)   Social Connection and Isolation Panel [NHANES]    Frequency of Communication with Friends and Family: More than three times a week    Frequency of Social Gatherings with Friends and Family: Twice a week    Attends Religious Services: More than 4 times per year    Active Member of Clubs or Organizations: Yes    Attends Archivist Meetings: Never    Marital Status: Divorced  Human resources officer Violence: Not At Risk (12/08/2021)   Humiliation, Afraid, Rape, and Kick questionnaire    Fear of Current or Ex-Partner: No    Emotionally Abused: No     Physically Abused: No    Sexually Abused: No    Physical Exam: Today's Vitals   08/22/22 1244 08/29/22 0634  BP:  126/80  Pulse:  71  Resp:  13  Temp:  98 F (36.7 C)  TempSrc:  Oral  SpO2:  98%  Weight: 88.9 kg 88.9 kg  Height:  '5\' 5"'$  (1.651 m)  PainSc:  0-No pain   Body mass index is 32.61 kg/m. GEN: NAD EYE: Sclerae anicteric ENT: MMM CV: Non-tachycardic GI: Soft, NT/ND NEURO:  Alert & Oriented x 3  Lab Results: No results for input(s): "WBC", "HGB", "HCT", "PLT" in the last 72 hours. BMET  No results for input(s): "NA", "K", "CL", "CO2", "GLUCOSE", "BUN", "CREATININE", "CALCIUM" in the last 72 hours. LFT No results for input(s): "PROT", "ALBUMIN", "AST", "ALT", "ALKPHOS", "BILITOT", "BILIDIR", "IBILI" in the last 72 hours. PT/INR No results for input(s): "LABPROT", "INR" in the last 72 hours.   Impression / Plan: This is a 56 y.o.adult who presents for EGD/EUS to evaluate dilated bile duct, slightly elevated LFTs, cholelithiasis and rule out choledocholithiasis.  The risks of an EUS including intestinal perforation, bleeding, infection, aspiration, and medication effects were discussed as was the possibility it may not give a definitive diagnosis if a biopsy is performed.  When a biopsy of the pancreas is done as part of the EUS, there is an additional risk of pancreatitis at the rate of about 1-2%.  It was explained that procedure related pancreatitis is typically mild, although it can be severe and even life threatening, which is why we do not perform random pancreatic biopsies and only biopsy a lesion/area we feel is concerning enough to warrant the risk.   The risks and benefits of endoscopic evaluation/treatment were discussed with the patient and/or family; these include but are not limited to the risk of perforation, infection, bleeding, missed lesions, lack of diagnosis, severe illness requiring hospitalization, as well as anesthesia and sedation related  illnesses.  The patient's history has been reviewed, patient examined, no change in status, and deemed stable for procedure.  The patient and/or family is agreeable to proceed.    Justice Britain, MD [facility] Office # [phone]

## 2022-08-29 NOTE — Transfer of Care (Signed)
Immediate Anesthesia Transfer of Care Note  Patient: Michaela Aguilar  Procedure(s) Performed: UPPER ENDOSCOPIC ULTRASOUND (EUS) RADIAL ESOPHAGOGASTRODUODENOSCOPY (EGD) BIOPSY  Patient Location: PACU  Anesthesia Type:MAC  Level of Consciousness: sedated  Airway & Oxygen Therapy: Patient Spontanous Breathing and Patient connected to face mask oxygen  Post-op Assessment: Report given to RN and Post -op Vital signs reviewed and stable  Post vital signs: Reviewed and stable  Last Vitals:  Vitals Value Taken Time  BP    Temp    Pulse 74 08/29/22 0827  Resp 18 08/29/22 0827  SpO2 100 % 08/29/22 0827  Vitals shown include unvalidated device data.  Last Pain:  Vitals:   08/29/22 0634  TempSrc: Oral  PainSc: 0-No pain         Complications: No notable events documented.

## 2022-08-30 LAB — SURGICAL PATHOLOGY

## 2022-08-31 ENCOUNTER — Encounter: Payer: Self-pay | Admitting: Surgery

## 2022-08-31 ENCOUNTER — Ambulatory Visit: Payer: Medicaid Other | Admitting: Surgery

## 2022-08-31 VITALS — BP 117/73 | HR 77 | Temp 98.3°F | Resp 14 | Ht 65.0 in | Wt 201.0 lb

## 2022-08-31 DIAGNOSIS — K8020 Calculus of gallbladder without cholecystitis without obstruction: Secondary | ICD-10-CM

## 2022-08-31 NOTE — Progress Notes (Signed)
[facility] History and Physical  Reason for Referral: Symptomatic cholelithiasis Referring Physician: Aliene Altes, PA  Chief Complaint   New Patient (Initial Visit)     Michaela VANDERLOOP is a 56 y.o. adult.  HPI: Patient presents for evaluation of cholelithiasis.  She states that she has right upper quadrant abdominal pain that is constant.  The pain is improved with movement, but becomes worse with relaxation.  This pain has been present for the last 2 weeks.  She has also had some issues with constipation, for which she took a laxative and had a bowel movement thereafter.  She denies ever having symptoms like this prior.  She is being followed by GI for elevated LFTs this past spring.  She subsequently underwent an abdominal ultrasound which demonstrated gallstones and prominence of the proximal CBD measuring 8.6 mm.  She underwent an MRCP which demonstrated hepatic steatosis, mildly dilated CBD measuring 8 mm with smooth tapering.  She was referred to Dr. Rush Landmark for EGD with EUS to evaluate CBD.  She underwent this procedure on 10/17, and was found to have a widely patent Schatzki ring of the esophagus, 1 cm hiatal hernia, erythematous mucosa in the stomach which was biopsied, smooth dilation of the CBD proximally and in the common hepatic duct without evidence of stones or sludge within the bile duct, gallstones, normal pancreas, and possible benign adrenal adenoma of the left adrenal gland.  Her past medical history is significant for diabetes, kidney stones, hyperlipidemia, and orthopedic injuries for which she is taking Flexeril.  Her surgical history is significant for an open ventral hernia repair with mesh 20 years ago in [place], and 2 C-sections.  She smokes 10 cigarettes/day.  She occasionally will drink alcohol and denies use of illicit drugs.  She denies use of blood thinning medications.  Past Medical History:  Diagnosis Date   Anxiety    Asthma    Bronchitis     Chronic pain of left knee 04/03/2017   Chronic pain of right knee 08/16/2017   Depression    Encounter for screening for malignant neoplasm of cervix 11/08/2019   Encounter for screening for malignant neoplasm of colon 11/08/2019   Encounter for screening mammogram for malignant neoplasm of breast 11/08/2019   Gallstones    Gestational diabetes    1992   History of kidney stones    Hypertension    Low back pain 11/17/2015   Neck pain 11/17/2015    Past Surgical History:  Procedure Laterality Date   CESAREAN SECTION     x2   COLONOSCOPY WITH PROPOFOL N/A 02/24/2022   Surgeon: [name], DO; Nonbleeding internal hemorrhoids.  4 mm polyp in the cecum removed.  Pathology with tubular adenoma.  Recommended 5-year surveillance.   CYSTOSCOPY WITH RETROGRADE PYELOGRAM, URETEROSCOPY AND STENT PLACEMENT Right 08/18/2019   Procedure: CYSTOSCOPY WITH RETROGRADE PYELOGRAM, URETEROSCOPY AND STENT PLACEMENT;  Surgeon: Cleon Gustin, MD;  Location: [facility];  Service: Urology;  Laterality: Right;   CYSTOSCOPY WITH STENT PLACEMENT Right 09/06/2016   Procedure: CYSTOSCOPY WITH RIGHT URETERAL STENT PLACEMENT;  Surgeon: Cleon Gustin, MD;  Location: [facility];  Service: Urology;  Laterality: Right;   CYSTOSCOPY/RETROGRADE/URETEROSCOPY/STONE EXTRACTION WITH BASKET Right 09/06/2016   Procedure: CYSTOSCOPY/RIGHT RETROGRADE PYELOGRAM, RIGHT RENAL STONE EXTRACTION WITH LASER;  Surgeon: Cleon Gustin, MD;  Location: [facility];  Service: Urology;  Laterality: Right;   EXTRACORPOREAL SHOCK WAVE LITHOTRIPSY  1995 2010   HERNIA REPAIR     incisional hernia  STONE EXTRACTION WITH BASKET Right 08/18/2019   Procedure: STONE EXTRACTION WITH BASKET;  Surgeon: Cleon Gustin, MD;  Location: [facility];  Service: Urology;  Laterality: Right;    Family History  Problem Relation Age of Onset   Osteoporosis Mother    Depression Father    Mental illness Father    Emphysema Maternal Grandmother     Cancer Maternal Grandfather        lung cancer   Cancer Maternal Uncle        lung cancer   COPD Maternal Uncle    Depression Maternal Uncle    Breast cancer Neg Hx    Colon cancer Neg Hx     Social History   Tobacco Use   Smoking status: Every Day    Packs/day: 0.50    Years: 15.00    Total pack years: 7.50    Types: Cigarettes   Smokeless tobacco: Never  Vaping Use   Vaping Use: Never used  Substance Use Topics   Alcohol use: Yes    Comment: occ   Drug use: No    Types: Marijuana    Comment: last marijuana use early October 2017    Medications: I have reviewed the patient's current medications. Allergies as of 08/31/2022   No Known Allergies      Medication List        Accurate as of August 31, 2022  9:40 AM. If you have any questions, ask your nurse or doctor.          albuterol 108 (90 Base) MCG/ACT inhaler Commonly known as: Ventolin HFA INHALE 2 PUFFS INTO LUNGS EVERY 6 HOURS AS NEEDED.   atorvastatin 40 MG tablet Commonly known as: LIPITOR Take 1 tablet (40 mg total) by mouth daily. What changed: when to take this   Biotin 5000 MCG Tabs Take 5,000 mcg by mouth in the morning.   blood glucose meter kit and supplies Dispense based on patient and insurance preference. Use up to four times daily as directed. (FOR ICD-10 E10.9, E11.9).   cyclobenzaprine 10 MG tablet Commonly known as: FLEXERIL Take 10 mg by mouth every evening.   Fish Oil 1000 MG Caps Take 1,000 mg by mouth in the morning and at bedtime.   ibuprofen 800 MG tablet Commonly known as: ADVIL Take 1 tablet (800 mg total) by mouth 3 (three) times daily. What changed:  when to take this reasons to take this   lisinopril 10 MG tablet Commonly known as: ZESTRIL Take 1 tablet (10 mg total) by mouth daily. What changed: when to take this   multivitamin with minerals Tabs tablet Take 1 tablet by mouth in the morning.   naproxen 500 MG tablet Commonly known as: NAPROSYN Take  500 mg by mouth daily as needed (severe pain.).   pantoprazole 40 MG tablet Commonly known as: PROTONIX Take 40 mg by mouth every evening.   Pen Needles 32G X 5 MM Misc Use to inject ozempic weekly   Trulicity 3 IR/4.8NI Sopn Generic drug: Dulaglutide 3 mg every Wednesday.   Vitamin D (Ergocalciferol) 1.25 MG (50000 UNIT) Caps capsule Commonly known as: DRISDOL TAKE 1 TABLET WEEKLY. (SAME DAY EACH WEEK) What changed: See the new instructions.         ROS:  Constitutional: negative for chills, fatigue, and fevers Eyes: negative for visual disturbance and pain Ears, nose, mouth, throat, and face: negative for ear drainage, sore throat, and sinus problems Respiratory: negative for cough, wheezing, and shortness of  breath Cardiovascular: negative for chest pain Gastrointestinal: positive for abdominal pain and nausea, negative for reflux symptoms and vomiting Genitourinary:negative for dysuria, frequency, and urinary retention Integument/breast: negative for dryness and rash Hematologic/lymphatic: negative for bleeding and lymphadenopathy Musculoskeletal:positive for back pain and joint pain Neurological: negative for dizziness and tremors Endocrine: negative for temperature intolerance  Blood pressure 117/73, pulse 77, temperature 98.3 F (36.8 C), temperature source Oral, resp. rate 14, height _0  (1.651 m), weight 201 lb (91.2 kg), last menstrual period 02/16/2014, SpO2 96 %. Physical Exam Vitals reviewed.  Constitutional:      Appearance: Normal appearance.  HENT:     Head: Normocephalic and atraumatic.  Eyes:     Extraocular Movements: Extraocular movements intact.     Pupils: Pupils are equal, round, and reactive to light.  Cardiovascular:     Rate and Rhythm: Normal rate and regular rhythm.  Pulmonary:     Effort: Pulmonary effort is normal.     Breath sounds: Normal breath sounds.  Abdominal:     Comments: Abdomen soft, nondistended, no percussion  tenderness, nontender to palpation; no rigidity, guarding, rebound tenderness; negative Murphy sign; midline supraumbilical cicatrix noted  Musculoskeletal:        General: Normal range of motion.     Cervical back: Normal range of motion.  Skin:    General: Skin is warm and dry.  Neurological:     General: No focal deficit present.     Mental Status: She is alert and oriented to person, place, and time.  Psychiatric:        Mood and Affect: Mood normal.        Behavior: Behavior normal.     Results: No results found for this or any previous visit (from the past 48 hour(s)).  No results found.   Assessment & Plan:  JOHNITA Aguilar is a 56 y.o. adult who presents for evaluation of cholelithiasis.  -Patient has had an extensive GI work-up of a dilated common bile duct.  She was noted to have cholelithiasis, and was referred to discuss laparoscopic cholecystectomy.  I explained the pathophysiology of cholelithiasis and why we recommend cholecystectomy. -I counseled the patient about the indication, risks and benefits of laparoscopic cholecystectomy.  She understands there is a very small chance for bleeding, infection, injury to normal structures (including common bile duct), conversion to open surgery, persistent symptoms, evolution of postcholecystectomy diarrhea, need for secondary interventions, anesthesia reaction, cardiopulmonary issues and other risks not specifically detailed here. I described the expected recovery, the plan for follow-up and the restrictions during the recovery phase.  All questions were answered. -Patient tentatively scheduled for surgery on 10/27 -Information provided to the patient regarding low-fat diet, cholecystectomy, and cholelithiasis -Advised the patient to present to the emergency department if she begins to have fever, chills, worsening right upper quadrant abdominal pain, nausea, and vomiting  All questions were answered to the satisfaction of the  patient and family.  Graciella Freer, DO [facility] [address] [place], [place] [id number] [phone] (office)

## 2022-09-01 ENCOUNTER — Encounter: Payer: Self-pay | Admitting: Gastroenterology

## 2022-09-01 NOTE — H&P (Signed)
[facility] History and Physical  Reason for Referral: Symptomatic cholelithiasis Referring Physician: Aliene Altes, PA  Chief Complaint   New Patient (Initial Visit)     Michaela Aguilar is a 56 y.o. adult.  HPI: Patient presents for evaluation of cholelithiasis.  She states that she has right upper quadrant abdominal pain that is constant.  The pain is improved with movement, but becomes worse with relaxation.  This pain has been present for the last 2 weeks.  She has also had some issues with constipation, for which she took a laxative and had a bowel movement thereafter.  She denies ever having symptoms like this prior.  She is being followed by GI for elevated LFTs this past spring.  She subsequently underwent an abdominal ultrasound which demonstrated gallstones and prominence of the proximal CBD measuring 8.6 mm.  She underwent an MRCP which demonstrated hepatic steatosis, mildly dilated CBD measuring 8 mm with smooth tapering.  She was referred to Dr. Rush Landmark for EGD with EUS to evaluate CBD.  She underwent this procedure on 10/17, and was found to have a widely patent Schatzki ring of the esophagus, 1 cm hiatal hernia, erythematous mucosa in the stomach which was biopsied, smooth dilation of the CBD proximally and in the common hepatic duct without evidence of stones or sludge within the bile duct, gallstones, normal pancreas, and possible benign adrenal adenoma of the left adrenal gland.  Her past medical history is significant for diabetes, kidney stones, hyperlipidemia, and orthopedic injuries for which she is taking Flexeril.  Her surgical history is significant for an open ventral hernia repair with mesh 20 years ago in [place], and 2 C-sections.  She smokes 10 cigarettes/day.  She occasionally will drink alcohol and denies use of illicit drugs.  She denies use of blood thinning medications.  Past Medical History:  Diagnosis Date   Anxiety    Asthma    Bronchitis     Chronic pain of left knee 04/03/2017   Chronic pain of right knee 08/16/2017   Depression    Encounter for screening for malignant neoplasm of cervix 11/08/2019   Encounter for screening for malignant neoplasm of colon 11/08/2019   Encounter for screening mammogram for malignant neoplasm of breast 11/08/2019   Gallstones    Gestational diabetes    1992   History of kidney stones    Hypertension    Low back pain 11/17/2015   Neck pain 11/17/2015    Past Surgical History:  Procedure Laterality Date   CESAREAN SECTION     x2   COLONOSCOPY WITH PROPOFOL N/A 02/24/2022   Surgeon: [name], DO; Nonbleeding internal hemorrhoids.  4 mm polyp in the cecum removed.  Pathology with tubular adenoma.  Recommended 5-year surveillance.   CYSTOSCOPY WITH RETROGRADE PYELOGRAM, URETEROSCOPY AND STENT PLACEMENT Right 08/18/2019   Procedure: CYSTOSCOPY WITH RETROGRADE PYELOGRAM, URETEROSCOPY AND STENT PLACEMENT;  Surgeon: Cleon Gustin, MD;  Location: [facility];  Service: Urology;  Laterality: Right;   CYSTOSCOPY WITH STENT PLACEMENT Right 09/06/2016   Procedure: CYSTOSCOPY WITH RIGHT URETERAL STENT PLACEMENT;  Surgeon: Cleon Gustin, MD;  Location: [facility];  Service: Urology;  Laterality: Right;   CYSTOSCOPY/RETROGRADE/URETEROSCOPY/STONE EXTRACTION WITH BASKET Right 09/06/2016   Procedure: CYSTOSCOPY/RIGHT RETROGRADE PYELOGRAM, RIGHT RENAL STONE EXTRACTION WITH LASER;  Surgeon: Cleon Gustin, MD;  Location: [facility];  Service: Urology;  Laterality: Right;   EXTRACORPOREAL SHOCK WAVE LITHOTRIPSY  1995 2010   HERNIA REPAIR     incisional hernia  STONE EXTRACTION WITH BASKET Right 08/18/2019   Procedure: STONE EXTRACTION WITH BASKET;  Surgeon: Cleon Gustin, MD;  Location: [facility];  Service: Urology;  Laterality: Right;    Family History  Problem Relation Age of Onset   Osteoporosis Mother    Depression Father    Mental illness Father    Emphysema Maternal Grandmother     Cancer Maternal Grandfather        lung cancer   Cancer Maternal Uncle        lung cancer   COPD Maternal Uncle    Depression Maternal Uncle    Breast cancer Neg Hx    Colon cancer Neg Hx     Social History   Tobacco Use   Smoking status: Every Day    Packs/day: 0.50    Years: 15.00    Total pack years: 7.50    Types: Cigarettes   Smokeless tobacco: Never  Vaping Use   Vaping Use: Never used  Substance Use Topics   Alcohol use: Yes    Comment: occ   Drug use: No    Types: Marijuana    Comment: last marijuana use early October 2017    Medications: I have reviewed the patient's current medications. Allergies as of 08/31/2022   No Known Allergies      Medication List        Accurate as of August 31, 2022  9:40 AM. If you have any questions, ask your nurse or doctor.          albuterol 108 (90 Base) MCG/ACT inhaler Commonly known as: Ventolin HFA INHALE 2 PUFFS INTO LUNGS EVERY 6 HOURS AS NEEDED.   atorvastatin 40 MG tablet Commonly known as: LIPITOR Take 1 tablet (40 mg total) by mouth daily. What changed: when to take this   Biotin 5000 MCG Tabs Take 5,000 mcg by mouth in the morning.   blood glucose meter kit and supplies Dispense based on patient and insurance preference. Use up to four times daily as directed. (FOR ICD-10 E10.9, E11.9).   cyclobenzaprine 10 MG tablet Commonly known as: FLEXERIL Take 10 mg by mouth every evening.   Fish Oil 1000 MG Caps Take 1,000 mg by mouth in the morning and at bedtime.   ibuprofen 800 MG tablet Commonly known as: ADVIL Take 1 tablet (800 mg total) by mouth 3 (three) times daily. What changed:  when to take this reasons to take this   lisinopril 10 MG tablet Commonly known as: ZESTRIL Take 1 tablet (10 mg total) by mouth daily. What changed: when to take this   multivitamin with minerals Tabs tablet Take 1 tablet by mouth in the morning.   naproxen 500 MG tablet Commonly known as: NAPROSYN Take  500 mg by mouth daily as needed (severe pain.).   pantoprazole 40 MG tablet Commonly known as: PROTONIX Take 40 mg by mouth every evening.   Pen Needles 32G X 5 MM Misc Use to inject ozempic weekly   Trulicity 3 IR/4.8NI Sopn Generic drug: Dulaglutide 3 mg every Wednesday.   Vitamin D (Ergocalciferol) 1.25 MG (50000 UNIT) Caps capsule Commonly known as: DRISDOL TAKE 1 TABLET WEEKLY. (SAME DAY EACH WEEK) What changed: See the new instructions.         ROS:  Constitutional: negative for chills, fatigue, and fevers Eyes: negative for visual disturbance and pain Ears, nose, mouth, throat, and face: negative for ear drainage, sore throat, and sinus problems Respiratory: negative for cough, wheezing, and shortness of  breath Cardiovascular: negative for chest pain Gastrointestinal: positive for abdominal pain and nausea, negative for reflux symptoms and vomiting Genitourinary:negative for dysuria, frequency, and urinary retention Integument/breast: negative for dryness and rash Hematologic/lymphatic: negative for bleeding and lymphadenopathy Musculoskeletal:positive for back pain and joint pain Neurological: negative for dizziness and tremors Endocrine: negative for temperature intolerance  Blood pressure 117/73, pulse 77, temperature 98.3 F (36.8 C), temperature source Oral, resp. rate 14, height 5' 5" (1.651 m), weight 201 lb (91.2 kg), last menstrual period 02/16/2014, SpO2 96 %. Physical Exam Vitals reviewed.  Constitutional:      Appearance: Normal appearance.  HENT:     Head: Normocephalic and atraumatic.  Eyes:     Extraocular Movements: Extraocular movements intact.     Pupils: Pupils are equal, round, and reactive to light.  Cardiovascular:     Rate and Rhythm: Normal rate and regular rhythm.  Pulmonary:     Effort: Pulmonary effort is normal.     Breath sounds: Normal breath sounds.  Abdominal:     Comments: Abdomen soft, nondistended, no percussion  tenderness, nontender to palpation; no rigidity, guarding, rebound tenderness; negative Murphy sign; midline supraumbilical cicatrix noted  Musculoskeletal:        General: Normal range of motion.     Cervical back: Normal range of motion.  Skin:    General: Skin is warm and dry.  Neurological:     General: No focal deficit present.     Mental Status: She is alert and oriented to person, place, and time.  Psychiatric:        Mood and Affect: Mood normal.        Behavior: Behavior normal.     Results: No results found for this or any previous visit (from the past 48 hour(s)).  No results found.   Assessment & Plan:  Michaela Aguilar is a 56 y.o. adult who presents for evaluation of cholelithiasis.  -Patient has had an extensive GI work-up of a dilated common bile duct.  She was noted to have cholelithiasis, and was referred to discuss laparoscopic cholecystectomy.  I explained the pathophysiology of cholelithiasis and why we recommend cholecystectomy. -I counseled the patient about the indication, risks and benefits of laparoscopic cholecystectomy.  She understands there is a very small chance for bleeding, infection, injury to normal structures (including common bile duct), conversion to open surgery, persistent symptoms, evolution of postcholecystectomy diarrhea, need for secondary interventions, anesthesia reaction, cardiopulmonary issues and other risks not specifically detailed here. I described the expected recovery, the plan for follow-up and the restrictions during the recovery phase.  All questions were answered. -Patient tentatively scheduled for surgery on 10/27 -Information provided to the patient regarding low-fat diet, cholecystectomy, and cholelithiasis -Advised the patient to present to the emergency department if she begins to have fever, chills, worsening right upper quadrant abdominal pain, nausea, and vomiting  All questions were answered to the satisfaction of the  patient and family.  Yashas Camilli, DO [facility] [address] [place], [place] [address] [phone] (office)      

## 2022-09-03 ENCOUNTER — Encounter (HOSPITAL_COMMUNITY): Payer: Self-pay | Admitting: Gastroenterology

## 2022-09-05 NOTE — Patient Instructions (Signed)
NURI BRANCA  09/05/2022     '@PREFPERIOPPHARMACY'$ @   Your procedure is scheduled on  09/08/2022.   Report to [facility] at  0700  A.M.   Call this number if you have problems the morning of surgery:  [phone]  If you experience any cold or flu symptoms such as cough, fever, chills, shortness of breath, etc. between now and your scheduled surgery, please notify us at the above number.   Remember:  Do not eat or drink after midnight.       Your last dose of trulicity should be on 03/47/4259 or before.     Take these medicines the morning of surgery with A SIP OF WATER                                     flexeril(if needed), protonix.    Do not wear jewelry, make-up or nail polish.  Do not wear lotions, powders, or perfumes, or deodorant.  Do not shave 48 hours prior to surgery.  Men may shave face and neck.  Do not bring valuables to the hospital.  [facility] is not responsible for any belongings or valuables.  Contacts, dentures or bridgework may not be worn into surgery.  Leave your suitcase in the car.  After surgery it may be brought to your room.  For patients admitted to the hospital, discharge time will be determined by your treatment team.  Patients discharged the day of surgery will not be allowed to drive home and must have someone with them for 24 hours.     Special instructions:   DO NOT smoke tobacco or vape for 24 hours before your procedure.  Please read over the following fact sheets that you were given. Coughing and Deep Breathing, Surgical Site Infection Prevention, Anesthesia Post-op Instructions, and Care and Recovery After Surgery      Minimally Invasive Cholecystectomy, Care After The following information offers guidance on how to care for yourself after your procedure. Your health care provider may also give you more specific instructions. If you have problems or questions, contact your health care provider. What can I expect  after the procedure? After the procedure, it is common to have: Pain at your incision sites. You will be given medicines to control this pain. Mild nausea or vomiting. Bloating and possible shoulder pain from the gas that was used during the procedure. Follow these instructions at home: Medicines Take over-the-counter and prescription medicines only as told by your health care provider. If you were prescribed an antibiotic medicine, take it as told by your health care provider. Do not stop using the antibiotic even if you start to feel better. Ask your health care provider if the medicine prescribed to you: Requires you to avoid driving or using machinery. Can cause constipation. You may need to take these actions to prevent or treat constipation: Drink enough fluid to keep your urine pale yellow. Take over-the-counter or prescription medicines. Eat foods that are high in fiber, such as beans, whole grains, and fresh fruits and vegetables. Limit foods that are high in fat and processed sugars, such as fried or sweet foods. Incision care  Follow instructions from your health care provider about how to take care of your incisions. Make sure you: Wash your hands with soap and water for at least 20 seconds before and after you change  your bandage (dressing). If soap and water are not available, use hand sanitizer. Change your dressing as told by your health care provider. Leave stitches (sutures), skin glue, or adhesive strips in place. These skin closures may need to be in place for 2 weeks or longer. If adhesive strip edges start to loosen and curl up, you may trim the loose edges. Do not remove adhesive strips completely unless your health care provider tells you to do that. Do not take baths, swim, or use a hot tub until your health care provider approves. Ask your health care provider if you may take showers. You may only be allowed to take sponge baths. Check your incision area every day for  signs of infection. Check for: More redness, swelling, or pain. Fluid or blood. Warmth. Pus or a bad smell. Activity Rest as told by your health care provider. Do not do activities that require a lot of effort. Avoid sitting for a long time without moving. Get up to take short walks every 1-2 hours. This is important to improve blood flow and breathing. Ask for help if you feel weak or unsteady. Do not lift anything that is heavier than 10 lb (4.5 kg), or the limit that you are told, until your health care provider says that it is safe. Do not play contact sports until your health care provider approves. Do not return to work or school until your health care provider approves. Return to your normal activities as told by your health care provider. Ask your health care provider what activities are safe for you. General instructions If you were given a sedative during the procedure, it can affect you for several hours. Do not drive or operate machinery until your health care provider says that it is safe. Keep all follow-up visits. This is important. Contact a health care provider if: You develop a rash. You have more redness, swelling, or pain around your incisions. You have fluid or blood coming from your incisions. Your incisions feel warm to the touch. You have pus or a bad smell coming from your incisions. You have a fever. One or more of your incisions breaks open. Get help right away if: You have trouble breathing. You have chest pain. You have more pain in your shoulders. You faint or feel dizzy when you stand. You have severe pain in your abdomen. You have nausea or vomiting that lasts for more than one day. You have leg pain that is new or unusual, or if it is localized to one specific spot. These symptoms may represent a serious problem that is an emergency. Do not wait to see if the symptoms will go away. Get medical help right away. Call your local emergency services (911 in  the [place] Do not drive yourself to the hospital. Summary After your procedure, it is common to have pain at the incision sites. You may also have nausea or bloating. Follow your health care provider's instructions about medicine, activity restrictions, and caring for your incision areas. Do not do activities that require a lot of effort. Contact a health care provider if you have a fever or other signs of infection, such as more redness, swelling, or pain around the incisions. Get help right away if you have chest pain, increasing pain in the shoulders, or trouble breathing. This information is not intended to replace advice given to you by your health care provider. Make sure you discuss any questions you have with your health care provider. Document Revised:  05/03/2021 Document Reviewed: 05/03/2021 Elsevier Patient Education  [place] Anesthesia, Adult, Care After The following information offers guidance on how to care for yourself after your procedure. Your health care provider may also give you more specific instructions. If you have problems or questions, contact your health care provider. What can I expect after the procedure? After the procedure, it is common for people to: Have pain or discomfort at the IV site. Have nausea or vomiting. Have a sore throat or hoarseness. Have trouble concentrating. Feel cold or chills. Feel weak, sleepy, or tired (fatigue). Have soreness and body aches. These can affect parts of the body that were not involved in surgery. Follow these instructions at home: For the time period you were told by your health care provider:  Rest. Do not participate in activities where you could fall or become injured. Do not drive or use machinery. Do not drink alcohol. Do not take sleeping pills or medicines that cause drowsiness. Do not make important decisions or sign legal documents. Do not take care of children on your own. General  instructions Drink enough fluid to keep your urine pale yellow. If you have sleep apnea, surgery and certain medicines can increase your risk for breathing problems. Follow instructions from your health care provider about wearing your sleep device: Anytime you are sleeping, including during daytime naps. While taking prescription pain medicines, sleeping medicines, or medicines that make you drowsy. Return to your normal activities as told by your health care provider. Ask your health care provider what activities are safe for you. Take over-the-counter and prescription medicines only as told by your health care provider. Do not use any products that contain nicotine or tobacco. These products include cigarettes, chewing tobacco, and vaping devices, such as e-cigarettes. These can delay incision healing after surgery. If you need help quitting, ask your health care provider. Contact a health care provider if: You have nausea or vomiting that does not get better with medicine. You vomit every time you eat or drink. You have pain that does not get better with medicine. You cannot urinate or have bloody urine. You develop a skin rash. You have a fever. Get help right away if: You have trouble breathing. You have chest pain. You vomit blood. These symptoms may be an emergency. Get help right away. Call 911. Do not wait to see if the symptoms will go away. Do not drive yourself to the hospital. Summary After the procedure, it is common to have a sore throat, hoarseness, nausea, vomiting, or to feel weak, sleepy, or fatigue. For the time period you were told by your health care provider, do not drive or use machinery. Get help right away if you have difficulty breathing, have chest pain, or vomit blood. These symptoms may be an emergency. This information is not intended to replace advice given to you by your health care provider. Make sure you discuss any questions you have with your health  care provider. Document Revised: 01/27/2022 Document Reviewed: 01/27/2022 Elsevier Patient Education  [place]. How to Use Chlorhexidine Before Surgery Chlorhexidine gluconate (CHG) is a germ-killing (antiseptic) solution that is used to clean the skin. It can get rid of the bacteria that normally live on the skin and can keep them away for about 24 hours. To clean your skin with CHG, you may be given: A CHG solution to use in the shower or as part of a sponge bath. A prepackaged cloth that contains CHG. Cleaning your  skin with CHG may help lower the risk for infection: While you are staying in the intensive care unit of the hospital. If you have a vascular access, such as a central line, to provide short-term or long-term access to your veins. If you have a catheter to drain urine from your bladder. If you are on a ventilator. A ventilator is a machine that helps you breathe by moving air in and out of your lungs. After surgery. What are the risks? Risks of using CHG include: A skin reaction. Hearing loss, if CHG gets in your ears and you have a perforated eardrum. Eye injury, if CHG gets in your eyes and is not rinsed out. The CHG product catching fire. Make sure that you avoid smoking and flames after applying CHG to your skin. Do not use CHG: If you have a chlorhexidine allergy or have previously reacted to chlorhexidine. On babies younger than 50 months of age. How to use CHG solution Use CHG only as told by your health care provider, and follow the instructions on the label. Use the full amount of CHG as directed. Usually, this is one bottle. During a shower Follow these steps when using CHG solution during a shower (unless your health care provider gives you different instructions): Start the shower. Use your normal soap and shampoo to wash your face and hair. Turn off the shower or move out of the shower stream. Pour the CHG onto a clean washcloth. Do not use any type  of brush or rough-edged sponge. Starting at your neck, lather your body down to your toes. Make sure you follow these instructions: If you will be having surgery, pay special attention to the part of your body where you will be having surgery. Scrub this area for at least 1 minute. Do not use CHG on your head or face. If the solution gets into your ears or eyes, rinse them well with water. Avoid your genital area. Avoid any areas of skin that have broken skin, cuts, or scrapes. Scrub your back and under your arms. Make sure to wash skin folds. Let the lather sit on your skin for 1-2 minutes or as long as told by your health care provider. Thoroughly rinse your entire body in the shower. Make sure that all body creases and crevices are rinsed well. Dry off with a clean towel. Do not put any substances on your body afterward--such as powder, lotion, or perfume--unless you are told to do so by your health care provider. Only use lotions that are recommended by the manufacturer. Put on clean clothes or pajamas. If it is the night before your surgery, sleep in clean sheets.  During a sponge bath Follow these steps when using CHG solution during a sponge bath (unless your health care provider gives you different instructions): Use your normal soap and shampoo to wash your face and hair. Pour the CHG onto a clean washcloth. Starting at your neck, lather your body down to your toes. Make sure you follow these instructions: If you will be having surgery, pay special attention to the part of your body where you will be having surgery. Scrub this area for at least 1 minute. Do not use CHG on your head or face. If the solution gets into your ears or eyes, rinse them well with water. Avoid your genital area. Avoid any areas of skin that have broken skin, cuts, or scrapes. Scrub your back and under your arms. Make sure to wash skin folds. Let  the lather sit on your skin for 1-2 minutes or as long as told by  your health care provider. Using a different clean, wet washcloth, thoroughly rinse your entire body. Make sure that all body creases and crevices are rinsed well. Dry off with a clean towel. Do not put any substances on your body afterward--such as powder, lotion, or perfume--unless you are told to do so by your health care provider. Only use lotions that are recommended by the manufacturer. Put on clean clothes or pajamas. If it is the night before your surgery, sleep in clean sheets. How to use CHG prepackaged cloths Only use CHG cloths as told by your health care provider, and follow the instructions on the label. Use the CHG cloth on clean, dry skin. Do not use the CHG cloth on your head or face unless your health care provider tells you to. When washing with the CHG cloth: Avoid your genital area. Avoid any areas of skin that have broken skin, cuts, or scrapes. Before surgery Follow these steps when using a CHG cloth to clean before surgery (unless your health care provider gives you different instructions): Using the CHG cloth, vigorously scrub the part of your body where you will be having surgery. Scrub using a back-and-forth motion for 3 minutes. The area on your body should be completely wet with CHG when you are done scrubbing. Do not rinse. Discard the cloth and let the area air-dry. Do not put any substances on the area afterward, such as powder, lotion, or perfume. Put on clean clothes or pajamas. If it is the night before your surgery, sleep in clean sheets.  For general bathing Follow these steps when using CHG cloths for general bathing (unless your health care provider gives you different instructions). Use a separate CHG cloth for each area of your body. Make sure you wash between any folds of skin and between your fingers and toes. Wash your body in the following order, switching to a new cloth after each step: The front of your neck, shoulders, and chest. Both of your  arms, under your arms, and your hands. Your stomach and groin area, avoiding the genitals. Your right leg and foot. Your left leg and foot. The back of your neck, your back, and your buttocks. Do not rinse. Discard the cloth and let the area air-dry. Do not put any substances on your body afterward--such as powder, lotion, or perfume--unless you are told to do so by your health care provider. Only use lotions that are recommended by the manufacturer. Put on clean clothes or pajamas. Contact a health care provider if: Your skin gets irritated after scrubbing. You have questions about using your solution or cloth. You swallow any chlorhexidine. Call your local poison control center (1-320 541 0762 in the [place] Get help right away if: Your eyes itch badly, or they become very red or swollen. Your skin itches badly and is red or swollen. Your hearing changes. You have trouble seeing. You have swelling or tingling in your mouth or throat. You have trouble breathing. These symptoms may represent a serious problem that is an emergency. Do not wait to see if the symptoms will go away. Get medical help right away. Call your local emergency services (911 in the [place] Do not drive yourself to the hospital. Summary Chlorhexidine gluconate (CHG) is a germ-killing (antiseptic) solution that is used to clean the skin. Cleaning your skin with CHG may help to lower your risk for infection. You may be given  CHG to use for bathing. It may be in a bottle or in a prepackaged cloth to use on your skin. Carefully follow your health care provider's instructions and the instructions on the product label. Do not use CHG if you have a chlorhexidine allergy. Contact your health care provider if your skin gets irritated after scrubbing. This information is not intended to replace advice given to you by your health care provider. Make sure you discuss any questions you have with your health care provider. Document  Revised: 02/27/2022 Document Reviewed: 01/10/2021 Elsevier Patient Education  [place].

## 2022-09-06 ENCOUNTER — Encounter (HOSPITAL_COMMUNITY): Payer: Self-pay

## 2022-09-06 ENCOUNTER — Encounter (HOSPITAL_COMMUNITY)
Admission: RE | Admit: 2022-09-06 | Discharge: 2022-09-06 | Disposition: A | Discharge status: Home / Self Care | Payer: Medicaid Other | Source: Ambulatory Visit | Attending: Surgery | Admitting: Surgery

## 2022-09-06 ENCOUNTER — Other Ambulatory Visit: Payer: Self-pay

## 2022-09-06 VITALS — BP 111/70 | HR 77 | Temp 98.3°F | Resp 18 | Ht 65.0 in | Wt 201.0 lb

## 2022-09-06 DIAGNOSIS — R030 Elevated blood-pressure reading, without diagnosis of hypertension: Secondary | ICD-10-CM | POA: Diagnosis not present

## 2022-09-06 DIAGNOSIS — Z01818 Encounter for other preprocedural examination: Secondary | ICD-10-CM | POA: Insufficient documentation

## 2022-09-06 DIAGNOSIS — E119 Type 2 diabetes mellitus without complications: Secondary | ICD-10-CM | POA: Diagnosis not present

## 2022-09-06 HISTORY — DX: Gastro-esophageal reflux disease without esophagitis: K21.9

## 2022-09-06 LAB — BASIC METABOLIC PANEL
Anion gap: 7 (ref 5–15)
BUN: 11 mg/dL (ref 6–20)
CO2: 24 mmol/L (ref 22–32)
Calcium: 9.8 mg/dL (ref 8.9–10.3)
Chloride: 108 mmol/L (ref 98–111)
Creatinine, Ser: 0.61 mg/dL (ref 0.44–1.00)
GFR, Estimated: >60 mL/min (ref >60)
Glucose, Bld: 74 mg/dL (ref 70–99)
Potassium: 4.5 mmol/L (ref 3.5–5.1)
Sodium: 139 mmol/L (ref 135–145)

## 2022-09-06 LAB — HEMOGLOBIN A1C
Hgb A1c MFr Bld: 5.2 % (ref 4.8–5.6)
Mean Plasma Glucose: 102.54 mg/dL

## 2022-09-08 ENCOUNTER — Ambulatory Visit (HOSPITAL_BASED_OUTPATIENT_CLINIC_OR_DEPARTMENT_OTHER): Payer: Medicaid Other | Admitting: Anesthesiology

## 2022-09-08 ENCOUNTER — Ambulatory Visit (HOSPITAL_COMMUNITY)
Admission: RE | Admit: 2022-09-08 | Discharge: 2022-09-08 | Disposition: A | Discharge status: Home / Self Care | Payer: Medicaid Other | Source: Ambulatory Visit | Attending: Surgery | Admitting: Surgery

## 2022-09-08 ENCOUNTER — Encounter (HOSPITAL_COMMUNITY): Payer: Self-pay | Admitting: Surgery

## 2022-09-08 ENCOUNTER — Ambulatory Visit (HOSPITAL_COMMUNITY): Payer: Medicaid Other | Admitting: Anesthesiology

## 2022-09-08 ENCOUNTER — Encounter (HOSPITAL_COMMUNITY)
Admission: RE | Disposition: A | Discharge status: Home / Self Care | Payer: Self-pay | Source: Ambulatory Visit | Attending: Surgery

## 2022-09-08 DIAGNOSIS — K8021 Calculus of gallbladder without cholecystitis with obstruction: Secondary | ICD-10-CM

## 2022-09-08 DIAGNOSIS — K449 Diaphragmatic hernia without obstruction or gangrene: Secondary | ICD-10-CM | POA: Insufficient documentation

## 2022-09-08 DIAGNOSIS — K760 Fatty (change of) liver, not elsewhere classified: Secondary | ICD-10-CM | POA: Diagnosis not present

## 2022-09-08 DIAGNOSIS — E785 Hyperlipidemia, unspecified: Secondary | ICD-10-CM | POA: Insufficient documentation

## 2022-09-08 DIAGNOSIS — K8020 Calculus of gallbladder without cholecystitis without obstruction: Secondary | ICD-10-CM

## 2022-09-08 DIAGNOSIS — K8010 Calculus of gallbladder with chronic cholecystitis without obstruction: Secondary | ICD-10-CM | POA: Insufficient documentation

## 2022-09-08 DIAGNOSIS — K5900 Constipation, unspecified: Secondary | ICD-10-CM | POA: Diagnosis not present

## 2022-09-08 DIAGNOSIS — I10 Essential (primary) hypertension: Secondary | ICD-10-CM | POA: Insufficient documentation

## 2022-09-08 DIAGNOSIS — Z87442 Personal history of urinary calculi: Secondary | ICD-10-CM | POA: Diagnosis not present

## 2022-09-08 DIAGNOSIS — J449 Chronic obstructive pulmonary disease, unspecified: Secondary | ICD-10-CM | POA: Diagnosis not present

## 2022-09-08 DIAGNOSIS — N8003 Adenomyosis of the uterus: Secondary | ICD-10-CM | POA: Insufficient documentation

## 2022-09-08 DIAGNOSIS — F17210 Nicotine dependence, cigarettes, uncomplicated: Secondary | ICD-10-CM | POA: Insufficient documentation

## 2022-09-08 DIAGNOSIS — K222 Esophageal obstruction: Secondary | ICD-10-CM | POA: Diagnosis not present

## 2022-09-08 DIAGNOSIS — K219 Gastro-esophageal reflux disease without esophagitis: Secondary | ICD-10-CM | POA: Insufficient documentation

## 2022-09-08 DIAGNOSIS — E119 Type 2 diabetes mellitus without complications: Secondary | ICD-10-CM | POA: Insufficient documentation

## 2022-09-08 HISTORY — PX: CHOLECYSTECTOMY: SHX55

## 2022-09-08 LAB — GLUCOSE, CAPILLARY: Glucose-Capillary: 103 mg/dL — ABNORMAL HIGH (ref 70–99)

## 2022-09-08 SURGERY — LAPAROSCOPIC CHOLECYSTECTOMY
Anesthesia: General | Site: Abdomen

## 2022-09-08 MED ORDER — ACETAMINOPHEN 500 MG PO TABS: 1000.0000 mg | ORAL_TABLET | Freq: Four times a day (QID) | ORAL | 0 refills | Status: AC

## 2022-09-08 MED ORDER — DEXAMETHASONE SODIUM PHOSPHATE 10 MG/ML IJ SOLN
INTRAMUSCULAR | Status: AC
  Filled 2022-09-08: qty 1

## 2022-09-08 MED ORDER — BUPIVACAINE HCL (PF) 0.5 % IJ SOLN
INTRAMUSCULAR | Status: AC
  Filled 2022-09-08: qty 30

## 2022-09-08 MED ORDER — PROPOFOL 10 MG/ML IV BOLUS
INTRAVENOUS | Status: DC | PRN
  Administered 2022-09-08: 150 mg via INTRAVENOUS

## 2022-09-08 MED ORDER — OXYCODONE HCL 5 MG PO TABS: 5.0000 mg | ORAL_TABLET | Freq: Four times a day (QID) | ORAL | 0 refills | Status: DC | PRN

## 2022-09-08 MED ORDER — ONDANSETRON HCL 4 MG/2ML IJ SOLN: 4.0000 mg | Freq: Once | INTRAMUSCULAR | Status: DC | PRN

## 2022-09-08 MED ORDER — FENTANYL CITRATE (PF) 100 MCG/2ML IJ SOLN
INTRAMUSCULAR | Status: DC | PRN
  Administered 2022-09-08: 100 ug via INTRAVENOUS

## 2022-09-08 MED ORDER — OXYCODONE HCL 5 MG/5ML PO SOLN: 5.0000 mg | Freq: Once | ORAL | Status: DC | PRN

## 2022-09-08 MED ORDER — FENTANYL CITRATE PF 50 MCG/ML IJ SOSY
25.0000 ug | PREFILLED_SYRINGE | INTRAMUSCULAR | Status: DC | PRN
  Administered 2022-09-08 (×2): 50 ug via INTRAVENOUS
  Filled 2022-09-08 (×2): qty 1

## 2022-09-08 MED ORDER — OXYCODONE HCL 5 MG PO TABS: 5.0000 mg | ORAL_TABLET | Freq: Once | ORAL | Status: DC | PRN

## 2022-09-08 MED ORDER — SODIUM CHLORIDE 0.9 % IV SOLN
2.0000 g | INTRAVENOUS | Status: AC
  Administered 2022-09-08: 2 g via INTRAVENOUS

## 2022-09-08 MED ORDER — ONDANSETRON HCL 4 MG/2ML IJ SOLN
INTRAMUSCULAR | Status: AC
  Filled 2022-09-08: qty 2

## 2022-09-08 MED ORDER — CHLORHEXIDINE GLUCONATE CLOTH 2 % EX PADS: 6.0000 | MEDICATED_PAD | Freq: Once | CUTANEOUS | Status: DC

## 2022-09-08 MED ORDER — FENTANYL CITRATE (PF) 100 MCG/2ML IJ SOLN
INTRAMUSCULAR | Status: AC
  Filled 2022-09-08: qty 2

## 2022-09-08 MED ORDER — BUPIVACAINE HCL (PF) 0.5 % IJ SOLN
INTRAMUSCULAR | Status: DC | PRN
  Administered 2022-09-08: 20 mL

## 2022-09-08 MED ORDER — SODIUM CHLORIDE 0.9 % IR SOLN
Status: DC | PRN
  Administered 2022-09-08: 1000 mL

## 2022-09-08 MED ORDER — CHLORHEXIDINE GLUCONATE 0.12 % MT SOLN
15.0000 mL | Freq: Once | OROMUCOSAL | Status: AC
  Administered 2022-09-08: 15 mL via OROMUCOSAL

## 2022-09-08 MED ORDER — PROMETHAZINE HCL 12.5 MG PO TABS: 12.5000 mg | ORAL_TABLET | Freq: Four times a day (QID) | ORAL | 0 refills | Status: DC | PRN

## 2022-09-08 MED ORDER — MIDAZOLAM HCL 2 MG/2ML IJ SOLN
INTRAMUSCULAR | Status: AC
  Filled 2022-09-08: qty 2

## 2022-09-08 MED ORDER — HEMOSTATIC AGENTS (NO CHARGE) OPTIME
TOPICAL | Status: DC | PRN
  Administered 2022-09-08: 1

## 2022-09-08 MED ORDER — DEXAMETHASONE SODIUM PHOSPHATE 4 MG/ML IJ SOLN
INTRAMUSCULAR | Status: DC | PRN
  Administered 2022-09-08: 10 mg via INTRAVENOUS

## 2022-09-08 MED ORDER — ROCURONIUM BROMIDE 10 MG/ML (PF) SYRINGE
PREFILLED_SYRINGE | INTRAVENOUS | Status: DC | PRN
  Administered 2022-09-08: 50 mg via INTRAVENOUS

## 2022-09-08 MED ORDER — MIDAZOLAM HCL 5 MG/5ML IJ SOLN
INTRAMUSCULAR | Status: DC | PRN
  Administered 2022-09-08: 2 mg via INTRAVENOUS

## 2022-09-08 MED ORDER — ORAL CARE MOUTH RINSE: 15.0000 mL | Freq: Once | OROMUCOSAL | Status: AC

## 2022-09-08 MED ORDER — LACTATED RINGERS IV SOLN: INTRAVENOUS | Status: DC

## 2022-09-08 MED ORDER — ONDANSETRON HCL 4 MG/2ML IJ SOLN
INTRAMUSCULAR | Status: DC | PRN
  Administered 2022-09-08: 4 mg via INTRAVENOUS

## 2022-09-08 MED ORDER — SUGAMMADEX SODIUM 200 MG/2ML IV SOLN
INTRAVENOUS | Status: DC | PRN
  Administered 2022-09-08: 200 mg via INTRAVENOUS

## 2022-09-08 MED ORDER — LIDOCAINE HCL (CARDIAC) PF 100 MG/5ML IV SOSY
PREFILLED_SYRINGE | INTRAVENOUS | Status: DC | PRN
  Administered 2022-09-08: 60 mg via INTRAVENOUS

## 2022-09-08 MED ORDER — SCOPOLAMINE 1 MG/3DAYS TD PT72
1.0000 | MEDICATED_PATCH | Freq: Once | TRANSDERMAL | Status: DC
  Administered 2022-09-08: 1.5 mg via TRANSDERMAL

## 2022-09-08 MED ORDER — DOCUSATE SODIUM 100 MG PO CAPS: 100.0000 mg | ORAL_CAPSULE | Freq: Two times a day (BID) | ORAL | 2 refills | Status: DC

## 2022-09-08 MED ORDER — PROPOFOL 10 MG/ML IV BOLUS
INTRAVENOUS | Status: AC
  Filled 2022-09-08: qty 20

## 2022-09-08 MED ORDER — SODIUM CHLORIDE 0.9 % IV SOLN
INTRAVENOUS | Status: AC
  Filled 2022-09-08: qty 2

## 2022-09-08 MED ORDER — LIDOCAINE HCL (PF) 2 % IJ SOLN
INTRAMUSCULAR | Status: AC
  Filled 2022-09-08: qty 5

## 2022-09-08 MED ORDER — ROCURONIUM BROMIDE 10 MG/ML (PF) SYRINGE
PREFILLED_SYRINGE | INTRAVENOUS | Status: AC
  Filled 2022-09-08: qty 10

## 2022-09-08 MED ORDER — SCOPOLAMINE 1 MG/3DAYS TD PT72
MEDICATED_PATCH | TRANSDERMAL | Status: AC
  Filled 2022-09-08: qty 1

## 2022-09-08 SURGICAL SUPPLY — 40 items
ADH SKN CLS APL DERMABOND .7 (GAUZE/BANDAGES/DRESSINGS) ×1
APL PRP STRL LF DISP 70% ISPRP (MISCELLANEOUS) ×1
APPLIER CLIP ROT 10 11.4 M/L (STAPLE) ×1
APR CLP MED LRG 11.4X10 (STAPLE) ×1
BLADE SURG 15 STRL LF DISP TIS (BLADE) ×1 IMPLANT
BLADE SURG 15 STRL SS (BLADE) ×1
CHLORAPREP W/TINT 26 (MISCELLANEOUS) ×1 IMPLANT
CLIP APPLIE ROT 10 11.4 M/L (STAPLE) ×1 IMPLANT
CLOTH BEACON ORANGE TIMEOUT ST (SAFETY) ×1 IMPLANT
COVER LIGHT HANDLE STERIS (MISCELLANEOUS) ×2 IMPLANT
DECANTER SPIKE VIAL GLASS SM (MISCELLANEOUS) ×1 IMPLANT
DERMABOND ADVANCED .7 DNX12 (GAUZE/BANDAGES/DRESSINGS) ×1 IMPLANT
ELECT REM PT RETURN 9FT ADLT (ELECTROSURGICAL) ×1
ELECTRODE REM PT RTRN 9FT ADLT (ELECTROSURGICAL) ×1 IMPLANT
GLOVE BIOGEL PI IND STRL 6.5 (GLOVE) ×1 IMPLANT
GLOVE BIOGEL PI IND STRL 7.0 (GLOVE) ×2 IMPLANT
GLOVE ECLIPSE 6.5 STRL STRAW (GLOVE) IMPLANT
GLOVE SURG SS PI 6.5 STRL IVOR (GLOVE) ×2 IMPLANT
GOWN STRL REUS W/TWL LRG LVL3 (GOWN DISPOSABLE) ×3 IMPLANT
HEMOSTAT SNOW SURGICEL 2X4 (HEMOSTASIS) ×1 IMPLANT
INST SET LAPROSCOPIC AP (KITS) ×1 IMPLANT
KIT TURNOVER KIT A (KITS) ×1 IMPLANT
MANIFOLD NEPTUNE II (INSTRUMENTS) ×1 IMPLANT
NDL INSUFFLATION 14GA 120MM (NEEDLE) ×1 IMPLANT
NEEDLE INSUFFLATION 14GA 120MM (NEEDLE) ×1 IMPLANT
NS IRRIG 1000ML POUR BTL (IV SOLUTION) ×1 IMPLANT
PACK LAP CHOLE LZT030E (CUSTOM PROCEDURE TRAY) ×1 IMPLANT
PAD ARMBOARD 7.5X6 YLW CONV (MISCELLANEOUS) ×1 IMPLANT
SET BASIN LINEN APH (SET/KITS/TRAYS/PACK) ×1 IMPLANT
SET TUBE SMOKE EVAC HIGH FLOW (TUBING) ×1 IMPLANT
SLEEVE Z-THREAD 5X100MM (TROCAR) ×1 IMPLANT
SUT MNCRL AB 4-0 PS2 18 (SUTURE) ×2 IMPLANT
SUT VICRYL 0 UR6 27IN ABS (SUTURE) ×1 IMPLANT
SYS BAG RETRIEVAL 10MM (BASKET) ×1
SYSTEM BAG RETRIEVAL 10MM (BASKET) ×1 IMPLANT
TROCAR Z-THRD FIOS HNDL 11X100 (TROCAR) ×1 IMPLANT
TROCAR Z-THREAD FIOS 5X100MM (TROCAR) ×1 IMPLANT
TROCAR Z-THREAD SLEEVE 11X100 (TROCAR) ×1 IMPLANT
TUBE CONNECTING 12X1/4 (SUCTIONS) ×1 IMPLANT
WARMER LAPAROSCOPE (MISCELLANEOUS) ×1 IMPLANT

## 2022-09-08 NOTE — Anesthesia Procedure Notes (Signed)
Procedure Name: Intubation Date/Time: 09/08/2022 9:04 AM  Performed by: Lieutenant Diego, CRNAPre-anesthesia Checklist: Patient identified, Emergency Drugs available, Suction available and Patient being monitored Patient Re-evaluated:Patient Re-evaluated prior to induction Oxygen Delivery Method: Circle system utilized Preoxygenation: Pre-oxygenation with 100% oxygen Induction Type: IV induction Ventilation: Mask ventilation without difficulty Laryngoscope Size: Miller and 2 Grade View: Grade I Tube type: Oral Tube size: 7.0 mm Number of attempts: 1 Airway Equipment and Method: Stylet Placement Confirmation: ETT inserted through vocal cords under direct vision, positive ETCO2 and breath sounds checked- equal and bilateral Secured at: 22 cm Tube secured with: Tape Dental Injury: Teeth and Oropharynx as per pre-operative assessment

## 2022-09-08 NOTE — Op Note (Signed)
Operative Note   Preoperative Diagnosis: Symptomatic cholelithiasis   Postoperative Diagnosis: Same   Procedure(s) Performed: Laparoscopic cholecystectomy   Surgeon: Graciella Freer, DO    Assistants: Cecilio Asper, RNFA   Anesthesia: General endotracheal   Anesthesiologist: Louann Sjogren, MD    Specimens: Gallbladder    Estimated Blood Loss: Minimal    Blood Replacement: None    Complications: None    Indications: Patient is a 56 year old female who presents for laparoscopic cholecystectomy.  She has been noted to have cholelithiasis with a chronically dilated CBD and subsequently underwent MRCP, EGD with EUS.  Overall the Lang[place] score 9.  She is agreeable to laparoscopic cholecystectomy at this time.  All  All risks and benefits of performing this procedure were discussed with the patient including pain, infection, bleeding, damage to the surrounding structures, and need for more procedures or surgery. The patient voiced understanding of the procedure, all questions were sought and answered, and consent was obtained.  Operative Findings: contracted gallbladder   Procedure: The patient was taken to the operating room and placed supine. General endotracheal anesthesia was induced. Intravenous antibiotics were administered per protocol. An orogastric tube positioned to decompress the stomach. The abdomen was prepared and draped in the usual sterile fashion.    A infraumbilical incision was made and a Veress technique was utilized to achieve pneumoperitoneum to 15 mmHg with carbon dioxide. A 11 mm optiview port was placed through the supraumbilical region, and a 10 mm 0-degree operative laparoscope was introduced. The area underlying the trocar and Veress needle were inspected and without evidence of injury.  Remaining trocars were placed under direct vision. Two 5 mm ports were placed in the right abdomen, between the anterior axillary and midclavicular line.  A final 11 mm port  was placed through the mid-epigastrium, near the falciform ligament.    The gallbladder fundus was elevated cephalad and the infundibulum was retracted to the patient's right. The gallbladder/cystic duct junction was skeletonized. The cystic artery noted in the triangle of Calot and was also skeletonized.  We then continued liberal medial and lateral dissection until the critical view of safety was achieved.    The cystic duct was triply clipped and the cystic artery was doubly clipped and divided. The gallbladder was then dissected from the liver bed with electrocautery. The specimen was placed in an Endopouch and was retrieved through the epigastric site.   Final inspection revealed acceptable hemostasis. Surgical SNOW was placed in the gallbladder bed.  Trocars were removed and pneumoperitoneum was released.  0 Vicryl fascial sutures were used to close the epigastric and umbilical port sites. Skin incisions were closed with 4-0 Monocryl subcuticular sutures and Dermabond. The patient was awakened from anesthesia and extubated without complication.    Graciella Freer, DO  [facility] [address] [place], [place] [address] [phone] (office)

## 2022-09-08 NOTE — Anesthesia Postprocedure Evaluation (Signed)
Anesthesia Post Note  Patient: Michaela Aguilar  Procedure(s) Performed: LAPAROSCOPIC CHOLECYSTECTOMY (Abdomen)  Patient location during evaluation: Phase II Anesthesia Type: General Level of consciousness: awake Pain management: pain level controlled Vital Signs Assessment: post-procedure vital signs reviewed and stable Respiratory status: spontaneous breathing and respiratory function stable Cardiovascular status: blood pressure returned to baseline and stable Postop Assessment: no headache and no apparent nausea or vomiting Anesthetic complications: no Comments: Late entry   No notable events documented.   Last Vitals:  Vitals:   09/08/22 1055 09/08/22 1124  BP: 111/74 (!) 114/98  Pulse: 64 76  Resp: 12 18  Temp:  36.7 C  SpO2: 97% 95%    Last Pain:  Vitals:   09/08/22 1124  TempSrc: Oral  PainSc: [place]

## 2022-09-08 NOTE — Interval H&P Note (Signed)
History and Physical Interval Note:  09/08/2022 8:14 AM  Michaela Aguilar  has presented today for surgery, with the diagnosis of CHOLELITHIASIS.  The various methods of treatment have been discussed with the patient and family. After consideration of risks, benefits and other options for treatment, the patient has consented to  Procedure(s): LAPAROSCOPIC CHOLECYSTECTOMY (N/A) as a surgical intervention.  The patient's history has been reviewed, patient examined, no change in status, stable for surgery.  I have reviewed the patient's chart and labs.  Questions were answered to the patient's satisfaction.     [place]

## 2022-09-08 NOTE — Transfer of Care (Signed)
Immediate Anesthesia Transfer of Care Note  Patient: Michaela Aguilar  Procedure(s) Performed: LAPAROSCOPIC CHOLECYSTECTOMY (Abdomen)  Patient Location: PACU  Anesthesia Type:General  Level of Consciousness: awake  Airway & Oxygen Therapy: Patient Spontanous Breathing and Patient connected to face mask oxygen  Post-op Assessment: Report given to RN and Post -op Vital signs reviewed and stable  Post vital signs: Reviewed and stable  Last Vitals:  Vitals Value Taken Time  BP    Temp    Pulse 73 09/08/22 1015  Resp 12 09/08/22 1015  SpO2 99 % 09/08/22 1015  Vitals shown include unvalidated device data.  Last Pain:  Vitals:   09/08/22 0728  TempSrc: Oral  PainSc: 0-No pain      Patients Stated Pain Goal: 5 (92/11/94 1740)  Complications: No notable events documented.

## 2022-09-08 NOTE — Discharge Instructions (Addendum)
Ambulatory Surgery Discharge Instructions  General Anesthesia or Sedation Do not drive or operate heavy machinery for 24 hours.  Do not consume alcohol, tranquilizers, sleeping medications, or any non-prescribed medications for 24 hours. Do not make important decisions or sign any important papers in the next 24 hours. You should have someone with you tonight at home.  Activity  You are advised to go directly home from the hospital.  Restrict your activities and rest for a day.  Resume light activity tomorrow. No heavy lifting over 10 lbs or strenuous exercise.  Fluids and Diet Begin with clear liquids, bouillon, dry toast, soda crackers.  If not nauseated, you may go to a regular diet when you desire.  Greasy and spicy foods are not advised.  Medications  If you have not had a bowel movement in 24 hours, take 2 tablespoons over the counter Milk of mag.             You May resume your blood thinners tomorrow (Aspirin, coumadin, or other).  You are being discharged with prescriptions for Opioid/Narcotic Medications: There are some specific considerations for these medications that you should know. Opioid Meds have risks & benefits. Addiction to these meds is always a concern with prolonged use Take medication only as directed Do not drive while taking narcotic pain medication Do not crush tablets or capsules Do not use a different container than medication was dispensed in Lock the container of medication in a cool, dry place out of reach of children and pets. Opioid medication can cause addiction Do not share with anyone else (this is a felony) Do not store medications for future use. Dispose of them properly.     Disposal:  Find a [facility] household drug take back site near you.  If you can't get to a drug take back site, use the recipe below as a last resort to dispose of expired, unused or unwanted drugs. Disposal  (Do not dispose chemotherapy drugs this way, talk to your  prescribing doctor instead.) Step 1: Mix drugs (do not crush) with dirt, kitty litter, or used coffee grounds and add a small amount of water to dissolve any solid medications. Step 2: Seal drugs in plastic bag. Step 3: Place plastic bag in trash. Step 4: Take prescription container and scratch out personal information, then recycle or throw away.  Operative Site  You have a liquid bandage over your incisions, this will begin to flake off in about a week. Ok to shower tomorrow. Keep wound clean and dry. No baths or swimming. No lifting more than 10 pounds.  Contact Information: If you have questions or concerns, please call our office, [phone], Monday- Thursday 8AM-5PM and Friday 8AM-12Noon.  If it is after hours or on the weekend, please call [facility] Main Number, [phone], and ask to speak to the surgeon on call for Dr. Pappayliou at [place].   SPECIFIC COMPLICATIONS TO WATCH FOR: Inability to urinate Fever over 101? F by mouth Nausea and vomiting lasting longer than 24 hours. Pain not relieved by medication ordered Swelling around the operative site Increased redness, warmth, hardness, around operative area Numbness, tingling, or cold fingers or toes Blood -soaked dressing, (small amounts of oozing may be normal) Increasing and progressive drainage from surgical area or exam site  

## 2022-09-08 NOTE — Anesthesia Preprocedure Evaluation (Signed)
Anesthesia Evaluation  Patient identified by MRN, date of birth, ID band Patient awake    Reviewed: Allergy & Precautions, H&P , NPO status , Patient's Chart, lab work & pertinent test results, reviewed documented beta blocker date and time   Airway Mallampati: II  TM Distance: >3 FB Neck ROM: full    Dental no notable dental hx.    Pulmonary asthma , COPD, Current Smoker and Patient abstained from smoking.,    Pulmonary exam normal breath sounds clear to auscultation       Cardiovascular Exercise Tolerance: Good hypertension, negative cardio ROS   Rhythm:regular Rate:Normal     Neuro/Psych PSYCHIATRIC DISORDERS Anxiety Depression negative neurological ROS     GI/Hepatic Neg liver ROS, GERD  Medicated,  Endo/Other  negative endocrine ROSdiabetes, Type 2  Renal/GU negative Renal ROS  negative genitourinary   Musculoskeletal   Abdominal   Peds  Hematology negative hematology ROS (+)   Anesthesia Other Findings   Reproductive/Obstetrics negative OB ROS                             Anesthesia Physical Anesthesia Plan  ASA: 2  Anesthesia Plan: General and General ETT   Post-op Pain Management:    Induction:   PONV Risk Score and Plan: Ondansetron  Airway Management Planned:   Additional Equipment:   Intra-op Plan:   Post-operative Plan:   Informed Consent: I have reviewed the patients History and Physical, chart, labs and discussed the procedure including the risks, benefits and alternatives for the proposed anesthesia with the patient or authorized representative who has indicated his/her understanding and acceptance.     Dental Advisory Given  Plan Discussed with: CRNA  Anesthesia Plan Comments:         Anesthesia Quick Evaluation

## 2022-09-08 NOTE — Progress Notes (Signed)
[facility]  Spoke with the patient's friend, Michaela Aguilar, in the consultation room.  Explained that she tolerated the procedure without difficulty.  She has dissolvable stitches under the skin at her incision sites and overlying skin glue.  The skin glue will flake off in 10 to 14 days.  She will be discharged home with a narcotic pain medication which she should take as needed for pain.  I have also given her prescription for Phenergan to help control nausea.  She should also take a stool softener if she is taking the narcotic, as it can cause constipation.  I want her taking scheduled Tylenol for the next week.  I will perform a phone follow-up with her in 2 weeks.  All questions were answered to her expressed satisfaction.  Michaela Freer, DO [facility] [address] [place], [place] [address] [phone] (office)

## 2022-09-11 LAB — SURGICAL PATHOLOGY

## 2022-09-13 ENCOUNTER — Encounter (HOSPITAL_COMMUNITY): Payer: Self-pay | Admitting: Surgery

## 2022-09-14 ENCOUNTER — Other Ambulatory Visit: Payer: Self-pay | Admitting: *Deleted

## 2022-09-14 MED ORDER — OXYCODONE HCL 5 MG PO TABS: 5.0000 mg | ORAL_TABLET | Freq: Four times a day (QID) | ORAL | 0 refills | Status: AC | PRN

## 2022-09-14 NOTE — Telephone Encounter (Signed)
Surgical Date: 09/08/2022 Procedure: Lap Chole  Received call from patient [phone]~ telephone.   Requested refill on pain management.   Prescription: Oxycodone '5mg'$ , #28 tabs Last refill: 09/08/2022 Pharmacy: [facility]

## 2022-09-19 ENCOUNTER — Ambulatory Visit (INDEPENDENT_AMBULATORY_CARE_PROVIDER_SITE_OTHER): Payer: Medicaid Other | Admitting: Surgery

## 2022-09-19 DIAGNOSIS — Z09 Encounter for follow-up examination after completed treatment for conditions other than malignant neoplasm: Secondary | ICD-10-CM

## 2022-09-19 NOTE — Progress Notes (Signed)
[facility]  I am calling the patient for post operative evaluation. This is not a billable encounter as it is under the [place] charges for the surgery.  The patient had a laparoscopic cholecystectomy on 10/27. The patient reports that she is doing well. She is tolerating a diet, having good pain control, and having regular Bms.  She has been a little constipated since the surgery.  I explained that it was likely the narcotic pain medications.  She should stop taking these if she no longer needs them, and continue stool softener.  If she is still having issues, she can take a laxative to help.  The incisions are healing well. The patient has no concerns.   Pathology: A. GALLBLADDER, CHOLECYSTECTOMY:  Early acute cholecystitis superimposed on chronic cholecystitis and cholelithiasis.  Focal adenomyosis of the gallbladder.  Negative for neoplasm.   Will see the patient PRN.   Graciella Freer, DO [facility] [address] [place], [place] [address] [phone] (office)

## 2022-10-31 ENCOUNTER — Ambulatory Visit: Payer: Medicaid Other | Admitting: Nutrition

## 2022-11-13 ENCOUNTER — Emergency Department (HOSPITAL_COMMUNITY): Payer: Medicaid Other

## 2022-11-13 ENCOUNTER — Encounter (HOSPITAL_COMMUNITY): Payer: Self-pay | Admitting: Emergency Medicine

## 2022-11-13 ENCOUNTER — Other Ambulatory Visit: Payer: Self-pay

## 2022-11-13 ENCOUNTER — Emergency Department (HOSPITAL_COMMUNITY)
Admission: EM | Admit: 2022-11-13 | Discharge: 2022-11-13 | Disposition: A | Discharge status: Home / Self Care | Payer: Medicaid Other | Attending: Emergency Medicine | Admitting: Emergency Medicine

## 2022-11-13 DIAGNOSIS — Z794 Long term (current) use of insulin: Secondary | ICD-10-CM | POA: Insufficient documentation

## 2022-11-13 DIAGNOSIS — Z7951 Long term (current) use of inhaled steroids: Secondary | ICD-10-CM | POA: Insufficient documentation

## 2022-11-13 DIAGNOSIS — R059 Cough, unspecified: Secondary | ICD-10-CM | POA: Diagnosis present

## 2022-11-13 DIAGNOSIS — J101 Influenza due to other identified influenza virus with other respiratory manifestations: Secondary | ICD-10-CM | POA: Diagnosis not present

## 2022-11-13 DIAGNOSIS — Z1152 Encounter for screening for COVID-19: Secondary | ICD-10-CM | POA: Insufficient documentation

## 2022-11-13 LAB — RESP PANEL BY RT-PCR (RSV, FLU A&B, COVID)  RVPGX2
Influenza A by PCR: POSITIVE — AB
Influenza B by PCR: NEGATIVE
Resp Syncytial Virus by PCR: NEGATIVE
SARS Coronavirus 2 by RT PCR: NEGATIVE

## 2022-11-13 MED ORDER — ACETAMINOPHEN 500 MG PO TABS
1000.0000 mg | ORAL_TABLET | Freq: Once | ORAL | Status: AC
  Administered 2022-11-13: 1000 mg via ORAL
  Filled 2022-11-13: qty 2

## 2022-11-13 MED ORDER — PREDNISONE 50 MG PO TABS: ORAL_TABLET | ORAL | 0 refills | Status: DC

## 2022-11-13 MED ORDER — ALBUTEROL SULFATE HFA 108 (90 BASE) MCG/ACT IN AERS
2.0000 | INHALATION_SPRAY | Freq: Once | RESPIRATORY_TRACT | Status: AC
  Administered 2022-11-13: 2 via RESPIRATORY_TRACT
  Filled 2022-11-13: qty 6.7

## 2022-11-13 MED ORDER — ALBUTEROL SULFATE (2.5 MG/3ML) 0.083% IN NEBU
2.5000 mg | INHALATION_SOLUTION | Freq: Once | RESPIRATORY_TRACT | Status: AC
  Administered 2022-11-13: 2.5 mg via RESPIRATORY_TRACT
  Filled 2022-11-13: qty 3

## 2022-11-13 MED ORDER — PREDNISONE 50 MG PO TABS
60.0000 mg | ORAL_TABLET | Freq: Once | ORAL | Status: AC
  Administered 2022-11-13: 60 mg via ORAL
  Filled 2022-11-13: qty 1

## 2022-11-13 MED ORDER — IPRATROPIUM-ALBUTEROL 0.5-2.5 (3) MG/3ML IN SOLN
3.0000 mL | Freq: Once | RESPIRATORY_TRACT | Status: AC
  Administered 2022-11-13: 3 mL via RESPIRATORY_TRACT
  Filled 2022-11-13: qty 3

## 2022-11-13 NOTE — ED Provider Notes (Signed)
[facility] EMERGENCY DEPARTMENT Provider Note   CSN: [id number] Arrival date & time: 11/13/22  1634     History  Chief Complaint  Patient presents with   Cough   Fever    Michaela Aguilar is a 57 y.o. adult.  Pt complains of a cough and fever for the past 4 days.  Pt exposed to influenza.   The history is provided by the patient. No language interpreter was used.  Cough Cough characteristics:  Non-productive Sputum characteristics:  Nondescript Severity:  Moderate Onset quality:  Gradual Duration:  4 days Timing:  Constant Progression:  Worsening Chronicity:  New Context: sick contacts   Relieved by:  Nothing Worsened by:  Nothing Ineffective treatments:  None tried Associated symptoms: fever   Fever Temp source:  Oral Severity:  Moderate Timing:  Constant Relieved by:  Nothing Worsened by:  Nothing Associated symptoms: cough        Home Medications Prior to Admission medications   Medication Sig Start Date End Date Taking? Authorizing Provider  albuterol (VENTOLIN HFA) 108 (90 Base) MCG/ACT inhaler INHALE 2 PUFFS INTO LUNGS EVERY 6 HOURS AS NEEDED. 01/26/22   Paseda, Dewaine Conger, FNP  atorvastatin (LIPITOR) 40 MG tablet Take 1 tablet (40 mg total) by mouth daily. Patient taking differently: Take 40 mg by mouth every evening. 11/03/21   Noreene Larsson, NP  Biotin 5000 MCG TABS Take 5,000 mcg by mouth in the morning.    [provider]  blood glucose meter kit and supplies Dispense based on patient and insurance preference. Use up to four times daily as directed. (FOR ICD-10 E10.9, E11.9). 04/13/21   Noreene Larsson, NP  cyclobenzaprine (FLEXERIL) 10 MG tablet Take 10 mg by mouth every evening.    [provider]  docusate sodium (COLACE) 100 MG capsule Take 1 capsule (100 mg total) by mouth 2 (two) times daily. 09/08/22 09/08/23  Pappayliou, Barnetta Chapel A, DO  ibuprofen (ADVIL) 800 MG tablet Take 1 tablet (800 mg total) by mouth 3 (three) times  daily. Patient taking differently: Take 800 mg by mouth every 8 (eight) hours as needed (pain.). 07/21/22   Hezzie Bump, NP  Insulin Pen Needle (PEN NEEDLES) 32G X 5 MM MISC Use to inject ozempic weekly 11/03/21   Noreene Larsson, NP  lisinopril (ZESTRIL) 10 MG tablet Take 1 tablet (10 mg total) by mouth daily. Patient taking differently: Take 10 mg by mouth every evening. 11/03/21   Noreene Larsson, NP  Multiple Vitamin (MULTIVITAMIN WITH MINERALS) TABS tablet Take 1 tablet by mouth in the morning.    [provider]  naproxen (NAPROSYN) 500 MG tablet Take 500 mg by mouth daily as needed (severe pain.).    [provider]  Omega-3 Fatty Acids (FISH OIL) 1000 MG CAPS Take 1,000 mg by mouth in the morning and at bedtime.    [provider]  pantoprazole (PROTONIX) 40 MG tablet Take 40 mg by mouth every evening.    [provider]  promethazine (PHENERGAN) 12.5 MG tablet Take 1 tablet (12.5 mg total) by mouth every 6 (six) hours as needed for nausea or vomiting. 09/08/22   Pappayliou, Catherine A, DO  TRULICITY 3 EH/2.0NO SOPN 3 mg every Wednesday. 07/21/22   [provider]  Vitamin D, Ergocalciferol, (DRISDOL) 1.25 MG (50000 UNIT) CAPS capsule TAKE 1 TABLET WEEKLY. (SAME DAY EACH WEEK) Patient taking differently: Take 50,000 Units by mouth every Wednesday. TAKE 1 TABLET WEEKLY. (SAME DAY EACH WEEK)  01/26/22   Renee Rival, FNP      Allergies    Patient has no known allergies.    Review of Systems   Review of Systems  Constitutional:  Positive for fever.  Respiratory:  Positive for cough.   All other systems reviewed and are negative.   Physical Exam Updated Vital Signs BP 110/60   Pulse 83   Temp 99.9 F (37.7 C) (Oral)   Resp 16   LMP 02/16/2014   SpO2 99%  Physical Exam Vitals and nursing note reviewed.  Constitutional:      Appearance: She is well-developed.  HENT:     Head: Normocephalic.     Right Ear: Tympanic membrane  normal.     Left Ear: Tympanic membrane normal.     Mouth/Throat:     Mouth: Mucous membranes are moist.  Cardiovascular:     Rate and Rhythm: Normal rate.  Pulmonary:     Effort: Pulmonary effort is normal.  Abdominal:     General: Abdomen is flat. There is no distension.  Musculoskeletal:        General: Normal range of motion.     Cervical back: Normal range of motion.  Skin:    General: Skin is warm.  Neurological:     General: No focal deficit present.     Mental Status: She is alert and oriented to person, place, and time.     ED Results / Procedures / Treatments   Labs (all labs ordered are listed, but only abnormal results are displayed) Labs Reviewed  RESP PANEL BY RT-PCR (RSV, FLU A&B, COVID)  RVPGX2 - Abnormal; Notable for the following components:      Result Value   Influenza A by PCR POSITIVE (*)    All other components within normal limits    EKG None  Radiology DG Chest 2 View  Result Date: 11/13/2022 CLINICAL DATA:  Fever, cough x4 days EXAM: CHEST - 2 VIEW COMPARISON:  06/13/2018 FINDINGS: The heart size and mediastinal contours are within normal limits. Both lungs are clear. The visualized skeletal structures are unremarkable. IMPRESSION: No active cardiopulmonary disease. Electronically Signed   By: Elmer Picker M.D.   On: 11/13/2022 17:20    Procedures Procedures    Medications Ordered in ED Medications  acetaminophen (TYLENOL) tablet 1,000 mg (1,000 mg Oral Given 11/13/22 1700)  ipratropium-albuterol (DUONEB) 0.5-2.5 (3) MG/3ML nebulizer solution 3 mL (3 mLs Nebulization Given 11/13/22 1858)  albuterol (PROVENTIL) (2.5 MG/3ML) 0.083% nebulizer solution 2.5 mg (2.5 mg Nebulization Given 11/13/22 1858)    ED Course/ Medical Decision Making/ A&P                           Medical Decision Making Pt complains of a cough and congestion.  Pt has a history of asthma.  Pt does not have an inhaler  Amount and/or Complexity of Data Reviewed Labs:  ordered. Decision-making details documented in ED Course.    Details: Labs ordered reviewed and interpreted.  Pt influenza A test is positive  Radiology: ordered and independent interpretation performed. Decision-making details documented in ED Course.    Details: Chest xray  no pneumonia   Risk OTC drugs. Prescription drug management. Risk Details: Pt given albuterol neb here with improvement.  Pt given prednisone and rx for prednisone.  Inhaler given            Final Clinical Impression(s) / ED Diagnoses Final diagnoses:  Influenza A  Rx / DC Orders ED Discharge Orders     None      An After Visit Summary was printed and given to the patient.    Sidney Ace 11/13/22 2046    Milton Ferguson, MD 11/15/22 1154

## 2022-11-13 NOTE — ED Notes (Signed)
Md notified re:  pts request for a neb tx

## 2022-11-13 NOTE — ED Triage Notes (Signed)
Pt c/o fever and cough x 4 days. A/o. No resp distress or sob noted in triage. Wet cough noted. C/o feeling raw in her throat from coughing. Fever in triage 103.2

## 2022-11-14 ENCOUNTER — Other Ambulatory Visit (HOSPITAL_COMMUNITY): Payer: Self-pay | Admitting: Nurse Practitioner

## 2022-11-14 DIAGNOSIS — Z1231 Encounter for screening mammogram for malignant neoplasm of breast: Secondary | ICD-10-CM

## 2022-11-16 ENCOUNTER — Emergency Department (HOSPITAL_COMMUNITY)
Admission: EM | Admit: 2022-11-16 | Discharge: 2022-11-16 | Disposition: A | Discharge status: Home / Self Care | Payer: Medicaid Other | Attending: Emergency Medicine | Admitting: Emergency Medicine

## 2022-11-16 ENCOUNTER — Other Ambulatory Visit: Payer: Self-pay

## 2022-11-16 DIAGNOSIS — J101 Influenza due to other identified influenza virus with other respiratory manifestations: Secondary | ICD-10-CM | POA: Diagnosis not present

## 2022-11-16 DIAGNOSIS — J9801 Acute bronchospasm: Secondary | ICD-10-CM | POA: Diagnosis not present

## 2022-11-16 DIAGNOSIS — R059 Cough, unspecified: Secondary | ICD-10-CM | POA: Diagnosis present

## 2022-11-16 MED ORDER — ALBUTEROL SULFATE HFA 108 (90 BASE) MCG/ACT IN AERS
2.0000 | INHALATION_SPRAY | RESPIRATORY_TRACT | Status: DC | PRN
  Administered 2022-11-16: 2 via RESPIRATORY_TRACT
  Filled 2022-11-16: qty 6.7

## 2022-11-16 MED ORDER — DEXAMETHASONE SODIUM PHOSPHATE 10 MG/ML IJ SOLN
10.0000 mg | Freq: Once | INTRAMUSCULAR | Status: AC
  Administered 2022-11-16: 10 mg via INTRAMUSCULAR
  Filled 2022-11-16: qty 1

## 2022-11-16 MED ORDER — BENZONATATE 200 MG PO CAPS: 200.0000 mg | ORAL_CAPSULE | Freq: Three times a day (TID) | ORAL | 0 refills | Status: DC | PRN

## 2022-11-16 MED ORDER — ACETAMINOPHEN 325 MG PO TABS
650.0000 mg | ORAL_TABLET | Freq: Once | ORAL | Status: AC | PRN
  Administered 2022-11-16: 650 mg via ORAL
  Filled 2022-11-16: qty 2

## 2022-11-16 NOTE — ED Provider Notes (Signed)
[facility] EMERGENCY DEPARTMENT Provider Note   CSN: [id number] Arrival date & time: 11/16/22  0111     History  Chief Complaint  Patient presents with   Cough    Michaela Aguilar is a 57 y.o. adult.  Presents to the emergency for evaluation of persistent fever and cough.  She reports that she has been feeling short of breath, especially when she walks.  Cough has been keeping her up at night.  Patient was seen in the ED earlier this week and tested positive for influenza A.       Home Medications Prior to Admission medications   Medication Sig Start Date End Date Taking? Authorizing Provider  albuterol (VENTOLIN HFA) 108 (90 Base) MCG/ACT inhaler INHALE 2 PUFFS INTO LUNGS EVERY 6 HOURS AS NEEDED. 01/26/22   Paseda, Dewaine Conger, FNP  atorvastatin (LIPITOR) 40 MG tablet Take 1 tablet (40 mg total) by mouth daily. Patient taking differently: Take 40 mg by mouth every evening. 11/03/21   Noreene Larsson, NP  Biotin 5000 MCG TABS Take 5,000 mcg by mouth in the morning.    [provider]  blood glucose meter kit and supplies Dispense based on patient and insurance preference. Use up to four times daily as directed. (FOR ICD-10 E10.9, E11.9). 04/13/21   Noreene Larsson, NP  cyclobenzaprine (FLEXERIL) 10 MG tablet Take 10 mg by mouth every evening.    [provider]  docusate sodium (COLACE) 100 MG capsule Take 1 capsule (100 mg total) by mouth 2 (two) times daily. 09/08/22 09/08/23  Pappayliou, Barnetta Chapel A, DO  ibuprofen (ADVIL) 800 MG tablet Take 1 tablet (800 mg total) by mouth 3 (three) times daily. Patient taking differently: Take 800 mg by mouth every 8 (eight) hours as needed (pain.). 07/21/22   Hezzie Bump, NP  Insulin Pen Needle (PEN NEEDLES) 32G X 5 MM MISC Use to inject ozempic weekly 11/03/21   Noreene Larsson, NP  lisinopril (ZESTRIL) 10 MG tablet Take 1 tablet (10 mg total) by mouth daily. Patient taking differently: Take 10 mg by mouth every evening. 11/03/21    Noreene Larsson, NP  Multiple Vitamin (MULTIVITAMIN WITH MINERALS) TABS tablet Take 1 tablet by mouth in the morning.    [provider]  naproxen (NAPROSYN) 500 MG tablet Take 500 mg by mouth daily as needed (severe pain.).    [provider]  Omega-3 Fatty Acids (FISH OIL) 1000 MG CAPS Take 1,000 mg by mouth in the morning and at bedtime.    [provider]  pantoprazole (PROTONIX) 40 MG tablet Take 40 mg by mouth every evening.    [provider]  predniSONE (DELTASONE) 50 MG tablet One tablet a day for 5 days 11/13/22   Fransico Meadow, PA-C  promethazine (PHENERGAN) 12.5 MG tablet Take 1 tablet (12.5 mg total) by mouth every 6 (six) hours as needed for nausea or vomiting. 09/08/22   Pappayliou, Catherine A, DO  TRULICITY 3 EH/6.3JS SOPN 3 mg every Wednesday. 07/21/22   [provider]  Vitamin D, Ergocalciferol, (DRISDOL) 1.25 MG (50000 UNIT) CAPS capsule TAKE 1 TABLET WEEKLY. (SAME DAY EACH WEEK) Patient taking differently: Take 50,000 Units by mouth every Wednesday. TAKE 1 TABLET WEEKLY. (SAME DAY EACH WEEK) 01/26/22   Renee Rival, FNP      Allergies    Patient has no known allergies.    Review of Systems   Review of Systems  Physical Exam Updated Vital Signs BP  130/73   Pulse 90   Temp (!) 101.6 F (38.7 C)   Resp (!) 22   Ht _0  (1.651 m)   Wt 91.2 kg   LMP 02/16/2014   SpO2 96%   BMI 33.46 kg/m  Physical Exam Vitals and nursing note reviewed.  Constitutional:      General: She is not in acute distress.    Appearance: She is well-developed.  HENT:     Head: Normocephalic and atraumatic.     Mouth/Throat:     Mouth: Mucous membranes are moist.  Eyes:     General: Vision grossly intact. Gaze aligned appropriately.     Extraocular Movements: Extraocular movements intact.     Conjunctiva/sclera: Conjunctivae normal.  Cardiovascular:     Rate and Rhythm: Normal rate and regular rhythm.     Pulses: Normal pulses.      Heart sounds: Normal heart sounds, S1 normal and S2 normal. No murmur heard.    No friction rub. No gallop.  Pulmonary:     Effort: Pulmonary effort is normal. No respiratory distress.     Breath sounds: Wheezing present.  Abdominal:     General: Bowel sounds are normal.     Palpations: Abdomen is soft.     Tenderness: There is no abdominal tenderness. There is no guarding or rebound.     Hernia: No hernia is present.  Musculoskeletal:        General: No swelling.     Cervical back: Full passive range of motion without pain, normal range of motion and neck supple. No spinous process tenderness or muscular tenderness. Normal range of motion.     Right lower leg: No edema.     Left lower leg: No edema.  Skin:    General: Skin is warm and dry.     Capillary Refill: Capillary refill takes less than 2 seconds.     Findings: No ecchymosis, erythema, rash or wound.  Neurological:     General: No focal deficit present.     Mental Status: She is alert and oriented to person, place, and time.     GCS: GCS eye subscore is 4. GCS verbal subscore is 5. GCS motor subscore is 6.     Cranial Nerves: Cranial nerves 2-12 are intact.     Sensory: Sensation is intact.     Motor: Motor function is intact.     Coordination: Coordination is intact.  Psychiatric:        Attention and Perception: Attention normal.        Mood and Affect: Mood normal.        Speech: Speech normal.        Behavior: Behavior normal.     ED Results / Procedures / Treatments   Labs (all labs ordered are listed, but only abnormal results are displayed) Labs Reviewed - No data to display  EKG None  Radiology No results found.  Procedures Procedures    Medications Ordered in ED Medications  dexamethasone (DECADRON) injection 10 mg (has no administration in time range)  albuterol (VENTOLIN HFA) 108 (90 Base) MCG/ACT inhaler 2 puff (has no administration in time range)  acetaminophen (TYLENOL) tablet 650 mg (650  mg Oral Given 11/16/22 0243)    ED Course/ Medical Decision Making/ A&P                           Medical Decision Making Risk OTC drugs.   Records reviewed  from previous visit.  Workup was thorough and negative except for influenza A.  Patient with mild wheezing currently.  She reports that she has had steroids in the past for similar and it has helped.  Will give a shot of Decadron.  Reinitiate albuterol therapy.        Final Clinical Impression(s) / ED Diagnoses Final diagnoses:  Influenza A  Bronchospasm    Rx / DC Orders ED Discharge Orders     None         Orpah Greek, MD 11/16/22 [phone]

## 2022-11-16 NOTE — ED Triage Notes (Signed)
Pt c/o NVD, cough, fever, otalgia, and headache. Pt was seen on 1/1 and tested positive for Flu A. Reports she is unable to sleep due to cough. Temp of 101.6 in triage.

## 2022-12-06 ENCOUNTER — Encounter: Payer: Medicaid Other | Attending: Gastroenterology | Admitting: Nutrition

## 2022-12-06 ENCOUNTER — Encounter: Payer: Self-pay | Admitting: Nutrition

## 2022-12-06 VITALS — Ht 65.0 in | Wt 181.6 lb

## 2022-12-06 DIAGNOSIS — E785 Hyperlipidemia, unspecified: Secondary | ICD-10-CM | POA: Diagnosis present

## 2022-12-06 DIAGNOSIS — K760 Fatty (change of) liver, not elsewhere classified: Secondary | ICD-10-CM | POA: Diagnosis present

## 2022-12-06 DIAGNOSIS — E1169 Type 2 diabetes mellitus with other specified complication: Secondary | ICD-10-CM | POA: Diagnosis present

## 2022-12-06 DIAGNOSIS — E119 Type 2 diabetes mellitus without complications: Secondary | ICD-10-CM

## 2022-12-06 DIAGNOSIS — E559 Vitamin D deficiency, unspecified: Secondary | ICD-10-CM | POA: Diagnosis present

## 2022-12-06 DIAGNOSIS — Z6838 Body mass index (BMI) 38.0-38.9, adult: Secondary | ICD-10-CM | POA: Insufficient documentation

## 2022-12-06 NOTE — Progress Notes (Signed)
Medical Nutrition Therapy  Appointment Start time:  6962 Appointment End time:1100  Primary concerns today: Obesity, DM Type 2  Referral diagnosis: E11.8, E66.9 Preferred learning style: No preference . Learning readiness: Ready   NUTRITION ASSESSMENT Follow up Lost 20 lbs. Has stop smoking.  Still taking Trulicity. Has been getting in 9000 steps a day.  Feels much better. Eating more whole plant based foods. Sleeping better and has more energy. Goes to MD within in the next month for labs.  She is making good progress and seems committed.  Anthropometrics  Wt Readings from Last 3 Encounters:  11/16/22 201 lb 1 oz (91.2 kg)  09/08/22 201 lb 1 oz (91.2 kg)  09/06/22 201 lb (91.2 kg)   Ht Readings from Last 3 Encounters:  11/16/22 '5\' 5"'$  (1.651 m)  09/08/22 '5\' 5"'$  (1.651 m)  09/06/22 '5\' 5"'$  (1.651 m)   There is no height or weight on file to calculate BMI. '@BMIFA'$ @ Facility age limit for growth %iles is 20 years. Facility age limit for growth %iles is 20 years.    Clinical Medical Hx: See chart Medications: Truliciity Labs: HIghest A1C 7.5% down to 6.5%  Lab Results  Component Value Date   HGBA1C 5.2 09/06/2022      Latest Ref Rng & Units 09/06/2022    1:27 PM 08/22/2022    8:31 AM 02/09/2022    1:38 PM  CMP  Glucose 70 - 99 mg/dL 74     BUN 6 - 20 mg/dL 11     Creatinine 0.44 - 1.00 mg/dL 0.61     Sodium 135 - 145 mmol/L 139     Potassium 3.5 - 5.1 mmol/L 4.5     Chloride 98 - 111 mmol/L 108     CO2 22 - 32 mmol/L 24     Calcium 8.9 - 10.3 mg/dL 9.8     Total Protein 6.1 - 8.1 g/dL  7.1  6.6   Total Bilirubin 0.2 - 1.2 mg/dL  0.7  0.4   AST 10 - 35 U/L  19  23   ALT 6 - 29 U/L  24  37     Notable Signs/Symptoms: None  Lifestyle & Dietary Hx Has a roommate, she cooks and shops for herself. Eats 2 meals, skips lunch. Dx 5+years ago.   Estimated daily fluid intake 16-24 oz Supplements: MVI, Omega Fish Oil, Vit D Sleep: 8 Stress / self-care:  none Current average weekly physical activity:  ADL 24-Hr Dietary Recall First Meal: Yogurt and banana Second Meal:  Cabbage soup, water Third Meal:cabbage soup, water   Estimated Energy Needs Calories: 1200 Carbohydrate: 135g Protein: 90g Fat: 33g   NUTRITION DIAGNOSIS  NB-1.2 Harmful beliefs/attitudes about food or nutrition-related topics (use with caution) As related to DIabetes Type 2.  As evidenced by A1C 6.5%..   NUTRITION INTERVENTION  Nutrition education (E-1) on the following topics:  Nutrition and Diabetes education provided on My Plate, CHO counting, meal planning, portion sizes, timing of meals, avoiding snacks between meals unless having a low blood sugar, target ranges for A1C and blood sugars, signs/symptoms and treatment of hyper/hypoglycemia, monitoring blood sugars, taking medications as prescribed, benefits of exercising 30 minutes per day and prevention of complications of DM.  Lifestyle Medicine  - Whole Food, Plant Predominant Nutrition is highly recommended: Eat Plenty of vegetables, Mushrooms, fruits, Legumes, Whole Grains, Nuts, seeds in lieu of processed meats, processed snacks/pastries red meat, poultry, eggs.    -It is better to avoid simple carbohydrates  including: Cakes, Sweet Desserts, Ice Cream, Soda (diet and regular), Sweet Tea, Candies, Chips, Cookies, Store Bought Juices, Alcohol in Excess of  1-2 drinks a day, Lemonade,  Artificial Sweeteners, Doughnuts, Coffee Creamers, "Sugar-free" Products, etc, etc.  This is not a complete list.....  Exercise: If you are able: 30 -60 minutes a day ,4 days a week, or 150 minutes a week.  The longer the better.  Combine stretch, strength, and aerobic activities.  If you were told in the past that you have high risk for cardiovascular diseases, you may seek evaluation by your heart doctor prior to initiating moderate to intense exercise programs.   Handouts Provided Include  Lifestyle Medicine Meal Plan plant  based Meal Plan card   Learning Style & Readiness for Change Teaching method utilized: Visual & Auditory  Demonstrated degree of understanding via: Teach Back  Barriers to learning/adherence to lifestyle change: none  Goals Established by Pt   Increase plant protein Continue eating fruits, vegetables and whole grains. Lose 10 lbs in the next 6 months.    MONITORING & EVALUATION Dietary intake, weekly physical activity, and blood sugars  in 3 month.  Next Steps  Patient is to work on consistent meals and better meal planning and exercise.Marland Kitchen

## 2022-12-06 NOTE — Patient Instructions (Addendum)
Goals  Increase plant protein Continue eating fruits, vegetables and whole grains. Lose 10 lbs in the next 6 months.

## 2022-12-12 ENCOUNTER — Encounter: Payer: Self-pay | Admitting: Nutrition

## 2022-12-13 ENCOUNTER — Other Ambulatory Visit (HOSPITAL_COMMUNITY): Payer: Self-pay | Admitting: Nurse Practitioner

## 2022-12-13 DIAGNOSIS — N63 Unspecified lump in unspecified breast: Secondary | ICD-10-CM

## 2022-12-20 ENCOUNTER — Encounter: Payer: Self-pay | Admitting: Internal Medicine

## 2022-12-27 ENCOUNTER — Ambulatory Visit (INDEPENDENT_AMBULATORY_CARE_PROVIDER_SITE_OTHER): Payer: Medicaid Other | Admitting: Obstetrics & Gynecology

## 2022-12-27 ENCOUNTER — Encounter: Payer: Self-pay | Admitting: Obstetrics & Gynecology

## 2022-12-27 VITALS — BP 111/73 | HR 77 | Ht 65.0 in | Wt 182.2 lb

## 2022-12-27 DIAGNOSIS — Z01419 Encounter for gynecological examination (general) (routine) without abnormal findings: Secondary | ICD-10-CM | POA: Diagnosis not present

## 2022-12-27 NOTE — Progress Notes (Signed)
   WELL-WOMAN EXAMINATION Patient name: Michaela Aguilar MRN [id number]  Date of birth: [birth year] Chief Complaint:   Annual Exam  History of Present Illness:   Michaela Aguilar is a 57 y.o. G2P0 PM female being seen today for a routine well-woman exam.  Denies vaginal discharge, bleeding or irritation. Denies pelvic or abdominal pain.  Today she notes no acute complaints or concerns   Patient's last menstrual period was 02/16/2014.  Last pap 11/2021.  Last mammogram: 12/2021. Last colonoscopy: 2023     12/27/2022    8:44 AM 03/08/2022    1:22 PM 12/08/2021   10:38 AM 11/03/2021    8:08 AM 05/27/2021    8:33 AM  Depression screen PHQ 2/9  Decreased Interest 0 0 0 0 0  Down, Depressed, Hopeless 0 0 0 0 0  PHQ - 2 Score 0 0 0 0 0  Altered sleeping 1  0    Tired, decreased energy 0  1    Change in appetite 0  0    Feeling bad or failure about yourself  0  0    Trouble concentrating 0  1    Moving slowly or fidgety/restless 0  0    Suicidal thoughts 0  0    PHQ-9 Score 1  2        Review of Systems:   Pertinent items are noted in HPI Denies any headaches, blurred vision, fatigue, shortness of breath, chest pain, abdominal pain, bowel movements, urination, or intercourse unless otherwise stated above.  Pertinent History Reviewed:  Reviewed past medical,surgical, social and family history.  Reviewed problem list, medications and allergies. Physical Assessment:   Vitals:   12/27/22 0838  BP: 111/73  Pulse: 77  Weight: 182 lb 3.2 oz (82.6 kg)  Height: '5\' 5"'$  (1.651 m)  Body mass index is 30.32 kg/m.        Physical Examination:   General appearance - well appearing, and in no distress  Mental status - alert, oriented to person, place, and time  Psych:  She has a normal mood and affect  Skin - warm and dry, normal color, no suspicious lesions noted  Chest - effort normal, all lung fields clear to auscultation bilaterally  Heart - normal rate and regular rhythm  Neck:  midline  trachea, no thyromegaly or nodules  Breasts - breasts appear normal, no suspicious masses, no skin or nipple changes or  axillary nodes  Abdomen - soft, nontender, nondistended, no masses or organomegaly  Pelvic - VULVA: normal appearing vulva with no masses, tenderness or lesions  VAGINA: normal appearing vagina with normal color and discharge, no lesions  CERVIX: normal appearing cervix without discharge or lesions, no CMT  UTERUS: uterus is felt to be normal size, shape, consistency and nontender   ADNEXA: No adnexal masses or tenderness noted.  Extremities:  No swelling or varicosities noted  Chaperone:  pt declined      Assessment & Plan:  1) Well-Woman Exam -screening exams up to date -mammogram scheduled -continue yearly exams   Follow-up: Return in about 1 year (around 12/28/2023) for Annual.   Janyth Pupa, DO Attending [place], [place] for [place], [place]

## 2023-01-02 ENCOUNTER — Ambulatory Visit (HOSPITAL_COMMUNITY): Payer: Medicaid Other

## 2023-01-02 ENCOUNTER — Inpatient Hospital Stay (HOSPITAL_COMMUNITY): Admission: RE | Admit: 2023-01-02 | Payer: Medicaid Other | Source: Ambulatory Visit

## 2023-01-03 ENCOUNTER — Ambulatory Visit (HOSPITAL_COMMUNITY): Payer: Medicaid Other

## 2023-01-11 ENCOUNTER — Encounter: Payer: Self-pay | Admitting: Radiology

## 2023-01-16 ENCOUNTER — Ambulatory Visit: Payer: Medicaid Other | Admitting: Gastroenterology

## 2023-01-17 ENCOUNTER — Ambulatory Visit: Payer: Medicaid Other | Admitting: Gastroenterology

## 2023-01-17 ENCOUNTER — Encounter: Payer: Self-pay | Admitting: Gastroenterology

## 2023-01-17 VITALS — BP 106/71 | HR 73 | Temp 97.7°F | Ht 65.0 in | Wt 185.0 lb

## 2023-01-17 DIAGNOSIS — K5900 Constipation, unspecified: Secondary | ICD-10-CM | POA: Diagnosis not present

## 2023-01-17 NOTE — Patient Instructions (Signed)
For constipation, we have provided you with bottle of Linzess, take one daily until good stool. Then continue over the counter, Miralax or store brand equivalent once daily to maintain regular stools. Reach out if ongoing problems with bowel movements.  Return office visit in six months.

## 2023-01-17 NOTE — Progress Notes (Signed)
GI Office Note    Referring Provider: Noreene Larsson, NP Primary Care Physician:  Noreene Larsson, NP  Primary Gastroenterologist: Elon Alas. Abbey Chatters, DO   Chief Complaint   Chief Complaint  Patient presents with   Follow-up    Feels like she isn't having good bm's    History of Present Illness   Michaela Aguilar is a 57 y.o. adult presenting today for follow up. Last seen in office 07/2022.   She has history of elevated ALT with prominence of proximal CBD on abd u/s. Her iron labs normal. Hep B serologies negative without immunity. HCV Ab negative. MRI/MRCP 02/2022 with hepatic steatosis, mildly dilated CBD measuring 8 mm with smooth tapering, no filling defects.  Stated mild tapering could suggest mild stricture of distal CBD. Evidence of biliary sludge and tiny gallstones without cholecystitis. Left benign adrenal adenoma.  she completed EUS as outlined below. Last LFTs normal 08/2022. She underwent cholecystectomy 08/2022.   She presents today with complains of inadequate BMs. Denies recent medications. On Trulicity for one year. When she first started Trulicity she was eating anyting, but now better with diet. She has had increased issues with infrequent stools. Colace not helping. Took MOM last week. Prunes/prune juice helped at first but not working as well.   Used to have BM every day. Now having hard cramps prior to BM. Has to strain. Having BM about twice per week.   She quit smoking 11/10/22 when she contracted the flu. Her weight has stabilized at 185 pounds, was around 181 pounds in 11/2022. Weighed around 230 pounds one year ago.  he continues to lose weight. Patient denies heartburn, dysphagia, n/v.  Improved reflux since weight loss and stopped smoking.   EGD/EUS 08/2022: -No gross lesions in the esophagus.  Widely patent Schatzki ring. -1 cm hiatal hernia -Medium amount of food residue in the stomach -Erythematous mucosa in the stomach, antral reactive gastropathy with  mild chronic fundic gastritis, no H. pylori -Smooth dilation of common bile duct proximally and and common hepatic duct.  No evidence of any stones or sludge or debris within the bile duct itself.  No evidence of stricturing. -Multiple stones seen in the gallbladder -No significant pathology in the pancreatic head, genu of the pancreas, pancreatic body and pancreatic tail -Abnormal echotexture in the left adrenal gland, previous MRI suggestive of benign adrenal adenoma -1 enlarged lymph node in the porta pedis region, endosonographic appearance consistent with benign inflammatory changes.   Colonoscopy 02/24/2022: Nonbleeding internal hemorrhoids.  4 mm polyp in the cecum removed.  Pathology with tubular adenoma.  Recommended 5-year surveillance.   Medications   Current Outpatient Medications  Medication Sig Dispense Refill   albuterol (VENTOLIN HFA) 108 (90 Base) MCG/ACT inhaler INHALE 2 PUFFS INTO LUNGS EVERY 6 HOURS AS NEEDED. 18 g 0   atorvastatin (LIPITOR) 40 MG tablet Take 1 tablet (40 mg total) by mouth daily. (Patient taking differently: Take 40 mg by mouth every evening.) 90 tablet 3   Biotin 5000 MCG TABS Take 5,000 mcg by mouth in the morning.     blood glucose meter kit and supplies Dispense based on patient and insurance preference. Use up to four times daily as directed. (FOR ICD-10 E10.9, E11.9). 1 each 0   cyclobenzaprine (FLEXERIL) 10 MG tablet Take 10 mg by mouth every evening.     Insulin Pen Needle (PEN NEEDLES) 32G X 5 MM MISC Use to inject ozempic weekly 50 each 0  lisinopril (ZESTRIL) 10 MG tablet Take 1 tablet (10 mg total) by mouth daily. (Patient taking differently: Take 10 mg by mouth every evening.) 90 tablet 3   Multiple Vitamin (MULTIVITAMIN WITH MINERALS) TABS tablet Take 1 tablet by mouth in the morning.     Omega-3 Fatty Acids (FISH OIL) 1000 MG CAPS Take 1,000 mg by mouth in the morning and at bedtime.     pantoprazole (PROTONIX) 40 MG tablet Take 40 mg by  mouth every evening.     TRULICITY 3 0000000 SOPN 3 mg every Wednesday.     Vitamin D, Ergocalciferol, (DRISDOL) 1.25 MG (50000 UNIT) CAPS capsule TAKE 1 TABLET WEEKLY. (SAME DAY EACH WEEK) (Patient taking differently: Take 50,000 Units by mouth every Wednesday. TAKE 1 TABLET WEEKLY. (SAME DAY EACH WEEK)) 12 capsule 0   No current facility-administered medications for this visit.    Allergies   Allergies as of 01/17/2023   (No Known Allergies)    Review of Systems   General: Negative for anorexia, unintentional weight loss, fever, chills, fatigue, weakness. ENT: Negative for hoarseness, difficulty swallowing , nasal congestion. CV: Negative for chest pain, angina, palpitations, dyspnea on exertion, peripheral edema.  Respiratory: Negative for dyspnea at rest, dyspnea on exertion, cough, sputum, wheezing.  GI: See history of present illness. GU:  Negative for dysuria, hematuria, urinary incontinence, urinary frequency, nocturnal urination.  Endo: Negative for unusual weight change.     Physical Exam   BP 106/71 (BP Location: Right Arm, Patient Position: Sitting, Cuff Size: Large)   Pulse 73   Temp 97.7 F (36.5 C) (Oral)   Ht '5\' 5"'$  (1.651 m)   Wt 185 lb (83.9 kg)   LMP 02/16/2014   SpO2 96%   BMI 30.79 kg/m    General: Well-nourished, well-developed in no acute distress.  Eyes: No icterus. Mouth: Oropharyngeal mucosa moist and pink ,  Abdomen: Bowel sounds are normal, nontender, nondistended, no hepatosplenomegaly or masses,  no abdominal bruits or hernia , no rebound or guarding.  Rectal: not performed Extremities: No lower extremity edema. No clubbing or deformities. Neuro: Alert and oriented x 4   Skin: Warm and dry, no jaundice.   Psych: Alert and cooperative, normal mood and affect.  Labs   See hpi  Imaging Studies   No results found.  Assessment   Constipation: recent change in stools likely multifactorial in setting of recent surgery, viral illness,  decreased oral intake. Colonoscopy up to date.   Abnormal LFTs: dilated CBD with unremarkable EUS. Now s/p cholecystectomy. Intentional weight loss in the past one year. LFTs now normalized. Continue to monitor.    PLAN   Linzess 122mg daily until adequate stools. Samples provided. Then start miralax one capful daily to maintain soft regular stools.  Patient to reach out if ongoing bowel issues. Return ov in six months. Consider rechecking LFTs at that time.    LLaureen Ochs LBobby Rumpf MSprague PSh[facility]

## 2023-01-23 ENCOUNTER — Ambulatory Visit (HOSPITAL_COMMUNITY)
Admission: RE | Admit: 2023-01-23 | Discharge: 2023-01-23 | Disposition: A | Discharge status: Home / Self Care | Payer: Medicaid Other | Source: Ambulatory Visit | Attending: Nurse Practitioner | Admitting: Nurse Practitioner

## 2023-01-23 ENCOUNTER — Encounter (HOSPITAL_COMMUNITY): Payer: Self-pay

## 2023-01-23 DIAGNOSIS — N63 Unspecified lump in unspecified breast: Secondary | ICD-10-CM | POA: Insufficient documentation

## 2023-02-06 MED ORDER — LUBIPROSTONE 24 MCG PO CAPS: 24.0000 ug | ORAL_CAPSULE | Freq: Two times a day (BID) | ORAL | 3 refills | Status: DC

## 2023-02-08 ENCOUNTER — Ambulatory Visit (HOSPITAL_COMMUNITY)
Admission: RE | Admit: 2023-02-08 | Discharge: 2023-02-08 | Disposition: A | Discharge status: Home / Self Care | Payer: Medicaid Other | Source: Ambulatory Visit | Attending: Urology | Admitting: Urology

## 2023-02-08 DIAGNOSIS — N200 Calculus of kidney: Secondary | ICD-10-CM

## 2023-02-14 ENCOUNTER — Ambulatory Visit: Payer: Medicaid Other | Admitting: Urology

## 2023-02-22 ENCOUNTER — Ambulatory Visit: Payer: Medicaid Other | Admitting: Surgery

## 2023-02-22 ENCOUNTER — Encounter: Payer: Self-pay | Admitting: Surgery

## 2023-02-22 VITALS — BP 101/68 | HR 75 | Temp 97.9°F | Resp 16 | Ht 65.0 in | Wt 181.0 lb

## 2023-02-22 DIAGNOSIS — N6001 Solitary cyst of right breast: Secondary | ICD-10-CM

## 2023-02-22 NOTE — Progress Notes (Signed)
[facility] Note   HPI:  57 y.o. Adult presents to clinic for for evaluation of a right breast cyst.  In January she first noted a bump in the lower inner quadrant of her right breast.  At that time it was red and inflamed.  She had never noticed this area prior to this.  She did not follow-up with anyone regarding the cyst being acutely inflamed, as she was in the hospital with the flu.  She denies incision and drainage or taking any antibiotics.  She subsequently underwent a mammogram in March where there were no abnormalities noted.  She then underwent right breast ultrasound which demonstrated this cyst, likely sebaceous cyst.  She denies any drainage from the cyst.  She can no longer palpate the area.  She denies any history of cysts anywhere else on her body.  Her past medical history significant for diabetes, kidney stones, and hyperlipidemia. The patient has no history of any masses, lumps, bumps, nipple changes or discharge. She had menarche at age 57, and her first pregnancy at age 57. She is G2P2. She did not breastfeed her children.  She has no history of any family breast cancer.  She has a history of lung cancer in her maternal grandfather.  She underwent menopause at 57.  She has never had any previous biopsies or concerning areas on mammogram.  She has not had any chest radiation.  Review of Systems:  All other review of systems: otherwise negative   Vital Signs:  BP 101/68   Pulse 75   Temp 97.9 F (36.6 C) (Oral)   Resp 16   Ht 5\' 5"  (1.651 m)   Wt 181 lb (82.1 kg)   LMP 02/16/2014   SpO2 95%   BMI 30.12 kg/m    Physical Exam:  Physical Exam Vitals reviewed.  Constitutional:      Appearance: Normal appearance.  HENT:     Head: Normocephalic and atraumatic.  Eyes:     Extraocular Movements: Extraocular movements intact.     Pupils: Pupils are equal, round, and reactive to light.  Cardiovascular:     Rate and Rhythm: Normal rate and regular rhythm.   Pulmonary:     Effort: Pulmonary effort is normal.     Breath sounds: Normal breath sounds.  Chest:  Breasts:    Right: No swelling, bleeding, inverted nipple, mass, nipple discharge, skin change or tenderness.     Left: No swelling, bleeding, inverted nipple, mass, nipple discharge, skin change or tenderness.  Musculoskeletal:        General: Normal range of motion.     Cervical back: Normal range of motion.  Lymphadenopathy:     Upper Body:     Right upper body: No axillary adenopathy.     Left upper body: No axillary adenopathy.  Skin:    General: Skin is warm and dry.  Neurological:     General: No focal deficit present.     Mental Status: She is alert and oriented to person, place, and time.  Psychiatric:        Mood and Affect: Mood normal.        Behavior: Behavior normal.     Laboratory studies: None  Imaging:  Bilateral diagnostic mammogram (01/23/2023): IMPRESSION: Sebaceous cyst at site of palpable concern in the inner right breast. No findings of malignancy in either breast.   RECOMMENDATION: 1. Clinical follow-up for sebaceous cyst within the skin of the right breast. The patient has been instructed  to return to her primary provider for further evaluation and possible antibiotics if she experiences warmth, redness, pain or increasing size of the sebaceous cyst in the far inner right breast.   2.  Screening mammogram in one year.(Code:SM-B-01Y)   I have discussed the findings and recommendations with the patient. If applicable, a reminder letter will be sent to the patient regarding the next appointment.   BI-RADS CATEGORY  2: Benign.  Right breast ultrasound (01/23/2023): IMPRESSION: Sebaceous cyst at site of palpable concern in the inner right breast. No findings of malignancy in either breast.   RECOMMENDATION: 1. Clinical follow-up for sebaceous cyst within the skin of the right breast. The patient has been instructed to return to her primary  provider for further evaluation and possible antibiotics if she experiences warmth, redness, pain or increasing size of the sebaceous cyst in the far inner right breast.   2.  Screening mammogram in one year.(Code:SM-B-01Y)   I have discussed the findings and recommendations with the patient. If applicable, a reminder letter will be sent to the patient regarding the next appointment.   BI-RADS CATEGORY  2: Benign.  Assessment:  57 y.o. yo Adult who presents for evaluation of a right breast cyst.  Plan:  -I explained to the patient the pathophysiology of sebaceous cysts, and why recommend surgical excision.  Given that I am unable to palpate this area currently, I recommend against any surgical excision -We discussed that given she just underwent breast imaging, it is a very low likelihood that this is malignant -Advised the patient to follow-up with me if the area starts to enlarge and is able to be palpated or if it gets inflamed again -Follow up as needed  All of the above recommendations were discussed with the patient, and all of patient's questions were answered to her expressed satisfaction.  Theophilus Kinds, DO [facility] [address] [place] [place], [place] [address] [phone] (office)

## 2023-03-05 ENCOUNTER — Ambulatory Visit: Payer: Medicaid Other | Admitting: Urology

## 2023-03-05 ENCOUNTER — Other Ambulatory Visit: Payer: Self-pay | Admitting: Gastroenterology

## 2023-03-05 DIAGNOSIS — N200 Calculus of kidney: Secondary | ICD-10-CM

## 2023-03-05 MED ORDER — LINACLOTIDE 290 MCG PO CAPS: 290.0000 ug | ORAL_CAPSULE | Freq: Every day | ORAL | 5 refills | Status: DC

## 2023-03-30 ENCOUNTER — Encounter: Payer: Self-pay | Admitting: Urology

## 2023-03-30 ENCOUNTER — Ambulatory Visit: Payer: Medicaid Other | Admitting: Urology

## 2023-03-30 VITALS — BP 101/47 | HR 88

## 2023-03-30 DIAGNOSIS — N200 Calculus of kidney: Secondary | ICD-10-CM

## 2023-03-30 LAB — MICROSCOPIC EXAMINATION

## 2023-03-30 LAB — URINALYSIS, ROUTINE W REFLEX MICROSCOPIC
Bilirubin, UA: NEGATIVE
Glucose, UA: NEGATIVE
Ketones, UA: NEGATIVE
Leukocytes,UA: NEGATIVE
Nitrite, UA: NEGATIVE
Protein,UA: NEGATIVE
Specific Gravity, UA: 1.025 (ref 1.005–1.030)
Urobilinogen, Ur: 1 mg/dL (ref 0.2–1.0)
pH, UA: 5.5 (ref 5.0–7.5)

## 2023-03-30 NOTE — Progress Notes (Unsigned)
03/30/2023 10:53 AM   Michaela Aguilar 09/12/1966 [id number]  Referring provider: Heather Roberts, NP [address] [place],  [place] [address]  Chief Complaint  Patient presents with   RENAL US    results    HPI: [name] is a 57yo here for followup for nephrolithiasis. No stone events since last. No significatn LUTS. Renal US shows right 8mm lower pole calculus which is stable. She has intermittent right flank pain.   PMH: Past Medical History:  Diagnosis Date   Anxiety    Asthma    Bronchitis    Chronic pain of left knee 04/03/2017   Chronic pain of right knee 08/16/2017   Depression    Encounter for screening for malignant neoplasm of cervix 11/08/2019   Encounter for screening for malignant neoplasm of colon 11/08/2019   Encounter for screening mammogram for malignant neoplasm of breast 11/08/2019   Gallstones    GERD (gastroesophageal reflux disease)    Gestational diabetes    1992   History of kidney stones    Hypertension    Low back pain 11/17/2015   Neck pain 11/17/2015    Surgical History: Past Surgical History:  Procedure Laterality Date   BIOPSY  08/29/2022   Procedure: BIOPSY;  Surgeon: Lemar Lofty., MD;  Location: [facility];  Service: Gastroenterology;;   CESAREAN SECTION     x2   CHOLECYSTECTOMY N/A 09/08/2022   Procedure: LAPAROSCOPIC CHOLECYSTECTOMY;  Surgeon: Lewie Chamber, DO;  Location: [facility];  Service: General;  Laterality: N/A;   COLONOSCOPY WITH PROPOFOL N/A 02/24/2022   Surgeon: Lanelle Bal, DO; Nonbleeding internal hemorrhoids.  4 mm polyp in the cecum removed.  Pathology with tubular adenoma.  Recommended 5-year surveillance.   CYSTOSCOPY WITH RETROGRADE PYELOGRAM, URETEROSCOPY AND STENT PLACEMENT Right 08/18/2019   Procedure: CYSTOSCOPY WITH RETROGRADE PYELOGRAM, URETEROSCOPY AND STENT PLACEMENT;  Surgeon: Malen Gauze, MD;  Location: [facility];  Service: Urology;  Laterality: Right;   CYSTOSCOPY WITH  STENT PLACEMENT Right 09/06/2016   Procedure: CYSTOSCOPY WITH RIGHT URETERAL STENT PLACEMENT;  Surgeon: Malen Gauze, MD;  Location: [facility];  Service: Urology;  Laterality: Right;   CYSTOSCOPY/RETROGRADE/URETEROSCOPY/STONE EXTRACTION WITH BASKET Right 09/06/2016   Procedure: CYSTOSCOPY/RIGHT RETROGRADE PYELOGRAM, RIGHT RENAL STONE EXTRACTION WITH LASER;  Surgeon: Malen Gauze, MD;  Location: [facility];  Service: Urology;  Laterality: Right;   ESOPHAGOGASTRODUODENOSCOPY N/A 08/29/2022   Procedure: ESOPHAGOGASTRODUODENOSCOPY (EGD);  Surgeon: Lemar Lofty., MD;  Location: [facility];  Service: Gastroenterology;  Laterality: N/A;   EUS N/A 08/29/2022   Procedure: UPPER ENDOSCOPIC ULTRASOUND (EUS) RADIAL;  Surgeon: Lemar Lofty., MD;  Location: [facility];  Service: Gastroenterology;  Laterality: N/A;   EXTRACORPOREAL SHOCK WAVE LITHOTRIPSY  1995 2010   HERNIA REPAIR     incisional hernia   STONE EXTRACTION WITH BASKET Right 08/18/2019   Procedure: STONE EXTRACTION WITH BASKET;  Surgeon: Malen Gauze, MD;  Location: [facility];  Service: Urology;  Laterality: Right;    Home Medications:  Allergies as of 03/30/2023   No Known Allergies      Medication List        Accurate as of Mar 30, 2023 10:53 AM. If you have any questions, ask your nurse or doctor.          albuterol 108 (90 Base) MCG/ACT inhaler Commonly known as: Ventolin HFA INHALE 2 PUFFS INTO LUNGS EVERY 6 HOURS AS NEEDED.   atorvastatin 40 MG tablet Commonly known as: LIPITOR Take  1 tablet (40 mg total) by mouth daily. What changed: when to take this   Biotin 5000 MCG Tabs Take 5,000 mcg by mouth in the morning.   blood glucose meter kit and supplies Dispense based on patient and insurance preference. Use up to four times daily as directed. (FOR ICD-10 E10.9, E11.9).   cyclobenzaprine 10 MG tablet Commonly known as: FLEXERIL Take 10 mg by mouth every evening.   Fish Oil 1000 MG  Caps Take 1,000 mg by mouth in the morning and at bedtime.   linaclotide 290 MCG Caps capsule Commonly known as: Linzess Take 1 capsule (290 mcg total) by mouth daily before breakfast.   lisinopril 10 MG tablet Commonly known as: ZESTRIL Take 1 tablet (10 mg total) by mouth daily. What changed: when to take this   multivitamin with minerals Tabs tablet Take 1 tablet by mouth in the morning.   pantoprazole 40 MG tablet Commonly known as: PROTONIX Take 40 mg by mouth every evening.   Pen Needles 32G X 5 MM Misc Use to inject ozempic weekly   Trulicity 3 MG/0.5ML Sopn Generic drug: Dulaglutide 3 mg every Wednesday.   Vitamin D (Ergocalciferol) 1.25 MG (50000 UNIT) Caps capsule Commonly known as: DRISDOL TAKE 1 TABLET WEEKLY. (SAME DAY EACH WEEK) What changed: See the new instructions.        Allergies: No Known Allergies  Family History: Family History  Problem Relation Age of Onset   Emphysema Maternal Grandmother    Cancer Maternal Grandfather        lung cancer   Depression Father    Mental illness Father    Osteoporosis Mother    Arrhythmia Mother    Cancer Maternal Uncle        lung cancer   COPD Maternal Uncle    Depression Maternal Uncle    Breast cancer Neg Hx    Colon cancer Neg Hx     Social History:  reports that she has quit smoking. Her smoking use included cigarettes. She has a 7.50 pack-year smoking history. She has never used smokeless tobacco. She reports current alcohol use. She reports that she does not use drugs.  ROS: All other review of systems were reviewed and are negative except what is noted above in HPI  Physical Exam: BP (!) 101/47   Pulse 88   LMP 02/16/2014   Constitutional:  Alert and oriented, No acute distress. HEENT: [place] AT, moist mucus membranes.  Trachea midline, no masses. Cardiovascular: No clubbing, cyanosis, or edema. Respiratory: Normal respiratory effort, no increased work of breathing. GI: Abdomen is soft,  nontender, nondistended, no abdominal masses GU: No CVA tenderness.  Lymph: No cervical or inguinal lymphadenopathy. Skin: No rashes, bruises or suspicious lesions. Neurologic: Grossly intact, no focal deficits, moving all 4 extremities. Psychiatric: Normal mood and affect.  Laboratory Data: Lab Results  Component Value Date   WBC 10.1 11/03/2021   HGB 15.2 11/03/2021   HCT 44.2 11/03/2021   MCV 96 11/03/2021   PLT 347 11/03/2021    Lab Results  Component Value Date   CREATININE 0.61 09/06/2022    No results found for: "PSA"  No results found for: "TESTOSTERONE"  Lab Results  Component Value Date   HGBA1C 5.2 09/06/2022    Urinalysis    Component Value Date/Time   COLORURINE YELLOW 03/18/2018 1130   APPEARANCEUR Clear 02/07/2022 1147   LABSPEC 1.014 03/18/2018 1130   PHURINE 6.0 03/18/2018 1130   GLUCOSEU Negative 02/07/2022 1147  HGBUR MODERATE (A) 03/18/2018 1130   BILIRUBINUR Negative 02/07/2022 1147   KETONESUR NEGATIVE 03/18/2018 1130   PROTEINUR Negative 02/07/2022 1147   PROTEINUR NEGATIVE 03/18/2018 1130   UROBILINOGEN 0.2 08/28/2015 2130   NITRITE Negative 02/07/2022 1147   NITRITE NEGATIVE 03/18/2018 1130   LEUKOCYTESUR Negative 02/07/2022 1147    Lab Results  Component Value Date   LABMICR See below: 02/07/2022   WBCUA None seen 02/07/2022   LABEPIT 0-10 02/07/2022   BACTERIA Few (A) 02/07/2022    Pertinent Imaging: Renal US 02/08/2023: Images reviewed and discussed with the patient  Results for orders placed during the hospital encounter of 04/15/19  DG Abd 1 View  Narrative CLINICAL DATA:  Right-sided kidney stones.  Right-sided flank pain.  EXAM: ABDOMEN - 1 VIEW  COMPARISON:  12/11/2018  FINDINGS: There is an 8 mm stone that projects over the expected region of the right renal pelvis. There is an 8 mm stone projecting over the lower pole the right kidney. Multiple phleboliths project over the patient's left hemipelvis. No  evidence of left-sided nephrolithiasis on this examination.  IMPRESSION: Right-sided nephrolithiasis as detailed above.   Electronically Signed By: Katherine Mantle M.D. On: 04/16/2019 03:00  No results found for this or any previous visit.  No results found for this or any previous visit.  No results found for this or any previous visit.  Results for orders placed during the hospital encounter of 02/08/23  Ultrasound renal complete  Narrative CLINICAL DATA:  Nephrolithiasis  EXAM: RENAL / URINARY TRACT ULTRASOUND COMPLETE  COMPARISON:  MR abdomen 03/02/2019; ultrasound abdomen 02/08/2019  FINDINGS: Right Kidney:  Renal measurements: 11.2 x 5.3 x 5.9 cm = volume: 182.9 mL. Echogenicity within normal limits. No mass. Minimal pelviectasis. Note is made of an 8 mm shadowing stone within the inferior pole.  Left Kidney:  Renal measurements: 12.3 x 5.7 x 5.2 cm = volume: 190.6 mL. Echogenicity within normal limits. No mass or hydronephrosis visualized.  Bladder:  Appears normal for degree of bladder distention.  Other:  None.  IMPRESSION: 1. Minimal right pelviectasis. 2. 8 mm stone inferior pole right kidney.   Electronically Signed By: Annia Belt M.D. On: 02/08/2023 10:53  No valid procedures specified. No results found for this or any previous visit.  Results for orders placed during the hospital encounter of 03/18/18  CT Renal Stone Study  Narrative CLINICAL DATA:  Initial evaluation for acute right flank pain.  EXAM: CT ABDOMEN AND PELVIS WITHOUT CONTRAST  TECHNIQUE: Multidetector CT imaging of the abdomen and pelvis was performed following the standard protocol without IV contrast.  COMPARISON:  Prior CT from 08/10/2017.  FINDINGS: Lower chest: Visualized lung bases are clear.  Hepatobiliary: Liver demonstrates a normal unenhanced appearance. Gallbladder contracted without acute abnormality. No biliary dilatation.  Pancreas:  Pancreas within normal limits.  Spleen: Spleen unremarkable.  Adrenals/Urinary Tract: 2.3 cm hypodense left adrenal nodule, consistent with a benign adenoma. Adrenals otherwise unremarkable.  Kidneys equal in size. Nonobstructive right renal nephrolithiasis, largest of which measures 7 mm within the lower pole. Additional nonobstructive 4 mm stone present within the upper pole left kidney. No radiopaque calculi seen along the course of either renal collecting system. No hydronephrosis or hydroureter. Partially distended bladder within normal limits. No layering stones within the bladder lumen.  Stomach/Bowel: Stomach within normal limits. No evidence for obstruction. Appendix is normal. Mild colonic diverticulosis without evidence for acute diverticulitis. No acute inflammatory changes about the bowels.  Vascular/Lymphatic: No aneurysm. Mild  atherosclerotic change. No adenopathy.  Reproductive: Uterus and ovaries within normal limits.  Other: No free air or fluid. Small fat containing ventral hernia noted without associated inflammation.  Musculoskeletal: No acute osseus abnormality. No worrisome lytic or blastic osseous lesions.  IMPRESSION: 1. Bilateral nonobstructive nephrolithiasis as above. No CT evidence for obstructive uropathy. 2. No other acute intra-abdominal or pelvic process. 3. Mild colonic diverticulosis without evidence for acute diverticulitis. 4. Stable left adrenal adenoma. 5. Small fat containing supraumbilical hernia without associated inflammation.   Electronically Signed By: Rise Mu M.D. On: 03/18/2018 14:00   Assessment & Plan:    1. Kidney stones -followup 1 year with renal US - Urinalysis, Routine w reflex microscopic   No follow-ups on file.  Wilkie Aye, MD  [facility] [place]

## 2023-04-03 ENCOUNTER — Encounter: Payer: Self-pay | Admitting: Urology

## 2023-04-03 NOTE — Patient Instructions (Signed)

## 2023-06-10 ENCOUNTER — Other Ambulatory Visit: Payer: Self-pay | Admitting: Urology

## 2023-06-26 ENCOUNTER — Encounter: Payer: Self-pay | Admitting: Orthopedic Surgery

## 2023-06-26 ENCOUNTER — Other Ambulatory Visit (INDEPENDENT_AMBULATORY_CARE_PROVIDER_SITE_OTHER): Payer: Medicaid Other

## 2023-06-26 ENCOUNTER — Ambulatory Visit: Payer: Medicaid Other | Admitting: Orthopedic Surgery

## 2023-06-26 DIAGNOSIS — M545 Low back pain, unspecified: Secondary | ICD-10-CM

## 2023-06-26 DIAGNOSIS — M25511 Pain in right shoulder: Secondary | ICD-10-CM

## 2023-06-26 DIAGNOSIS — G8929 Other chronic pain: Secondary | ICD-10-CM | POA: Diagnosis not present

## 2023-06-26 NOTE — Progress Notes (Signed)
Orthopaedic Clinic Return  Assessment: Michaela Aguilar is a 57 y.o. adult with the following: Right shoulder rotator cuff tear, supraspinatus with retraction to the mid humeral head  Plan: Mrs. Michaela Aguilar continues to have pain in the right shoulder.  Injections have been effective.  She has adjusted her activities, so as not to irritate her right shoulder.  She is not interested in surgery.  She would like to continue with another steroid injection.  In addition, she reports that she has chronic low back pain.  She has had injections in the lower back in the past, and these have been very effective.  She is interested in a referral to see Dr. Alvester Morin.  Referral has been placed.  If she has not heard from anyone within 1-2 weeks, she will contact clinic.  Procedure note injection - Right shoulder    Verbal consent was obtained to inject the right shoulder, subacromial space Timeout was completed to confirm the site of injection.   The skin was prepped with alcohol and ethyl chloride was sprayed at the injection site.  A 21-gauge needle was used to inject 40 mg of Depo-Medrol and 1% lidocaine (3 cc) into the subacromial space of the right shoulder using a posterolateral approach.  There were no complications.  A sterile bandage was applied.    Follow-up: Return if symptoms worsen or fail to improve.   Subjective:  Chief Complaint  Patient presents with   Shoulder Pain    R shoulder pain     History of Present Illness: Michaela Aguilar is a 57 y.o. adult who returns to clinic for repeat evaluation of her right shoulder.  She has a known rotator cuff injury.  She is not interested in surgery.  She states that she does well with activities below the level of the shoulder.  Minimal pain when she keeps her arms close to her body.  Pain is getting worse at night.  Is starting to ache.  She continues to take Flexeril.  Prior injections have been very effective.  She is interested in another injection.   She also has chronic low back pain.  She states that she had injections in the lower back greater then a year ago, and is interested in additional injections.  Review of Systems: No fevers or chills No numbness or tingling No chest pain No shortness of breath No bowel or bladder dysfunction No GI distress No headaches   Objective: LMP 02/16/2014   Physical Exam:  Alert and oriented.  No acute distress.  Evaluation right shoulder demonstrates no deformity.  She is able to achieve forward flexion of approximately 160 degrees, with some discomfort.  pain in the empty can testing position.  Negative belly press.  Fingers warm and well-perfused.  IMAGING: No new imaging obtained today.    Oliver Barre, MD 06/26/2023 9:16 AM

## 2023-06-26 NOTE — Patient Instructions (Signed)
Instructions Following Joint Injections  In clinic today, you received an injection in one of your joints (sometimes more than one).  Occasionally, you can have some pain at the injection site, this is normal.  You can place ice at the injection site, or take over-the-counter medications such as Tylenol (acetaminophen) or Advil (ibuprofen).  Please follow all directions listed on the bottle.  If your joint (knee or shoulder) becomes swollen, red or very painful, please contact the clinic for additional assistance.   Two medications were injected, including lidocaine and a steroid (often referred to as cortisone).  Lidocaine is effective almost immediately but wears off quickly.  However, the steroid can take a few days to improve your symptoms.  In some cases, it can make your pain worse for a couple of days.  Do not be concerned if this happens as it is common.  You can apply ice or take some over-the-counter medications as needed.   

## 2023-07-03 ENCOUNTER — Ambulatory Visit: Payer: Medicaid Other | Admitting: Physical Medicine and Rehabilitation

## 2023-07-09 ENCOUNTER — Encounter: Payer: Self-pay | Admitting: Gastroenterology

## 2023-07-10 ENCOUNTER — Telehealth: Payer: Self-pay | Admitting: Physical Medicine and Rehabilitation

## 2023-07-10 ENCOUNTER — Ambulatory Visit: Payer: Medicaid Other | Admitting: Physical Medicine and Rehabilitation

## 2023-07-10 NOTE — Telephone Encounter (Signed)
Received call from Leafy Half stating patient had to go out of town for an emergency and needed to cancel her appointment. Zella Ball said patient will R/S appointment when she return.    Pt. Number-[phone]

## 2023-08-01 ENCOUNTER — Ambulatory Visit: Payer: Medicaid Other | Admitting: Nutrition

## 2023-08-14 ENCOUNTER — Ambulatory Visit: Payer: Medicaid Other | Admitting: Gastroenterology

## 2023-08-21 ENCOUNTER — Encounter: Payer: Self-pay | Admitting: Gastroenterology

## 2023-08-21 ENCOUNTER — Ambulatory Visit: Payer: Medicaid Other | Admitting: Gastroenterology

## 2023-08-21 VITALS — BP 105/76 | HR 75 | Temp 98.1°F | Ht 65.0 in | Wt 190.8 lb

## 2023-08-21 DIAGNOSIS — K5900 Constipation, unspecified: Secondary | ICD-10-CM | POA: Diagnosis not present

## 2023-08-21 MED ORDER — LINACLOTIDE 290 MCG PO CAPS: 290.0000 ug | ORAL_CAPSULE | Freq: Every day | ORAL | 3 refills | Status: AC

## 2023-08-21 NOTE — Patient Instructions (Addendum)
Continue Linzess daily before breakfast for constipation. Your next colonoscopy will be due in 02/2027. Return to the office in one year or sooner if needed.

## 2023-08-21 NOTE — Progress Notes (Signed)
GI Office Note    Referring Provider: Heather Roberts, NP Primary Care Physician:  Heather Roberts, NP  Primary Gastroenterologist: Hennie Duos. Marletta Lor, DO   Chief Complaint   Chief Complaint  Patient presents with   Follow-up    Doing well, linzess is working well    History of Present Illness   Michaela Aguilar is a 57 y.o. adult presenting today for follow-up.  Last seen March 2024.  Presents for follow up constipation. Doing well. Linzess keeps stools regular. No melena, brbpr. No abd pain. No ugi sx.   Lfts normal 07/31/23.  She has history of elevated ALT with prominence of proximal CBD on abd u/s. Her iron labs normal. Hep B serologies negative without immunity. HCV Ab negative. MRI/MRCP 02/2022 with hepatic steatosis, mildly dilated CBD measuring 8 mm with smooth tapering, no filling defects.  Stated mild tapering could suggest mild stricture of distal CBD. Evidence of biliary sludge and tiny gallstones without cholecystitis. Left benign adrenal adenoma. She completed EUS as outlined below. Last LFTs normal 08/2022. She underwent cholecystectomy 08/2022.   EGD/EUS 08/2022: -No gross lesions in the esophagus.  Widely patent Schatzki ring. -1 cm hiatal hernia -Medium amount of food residue in the stomach -Erythematous mucosa in the stomach, antral reactive gastropathy with mild chronic fundic gastritis, no H. pylori -Smooth dilation of common bile duct proximally and and common hepatic duct.  No evidence of any stones or sludge or debris within the bile duct itself.  No evidence of stricturing. -Multiple stones seen in the gallbladder -No significant pathology in the pancreatic head, genu of the pancreas, pancreatic body and pancreatic tail -Abnormal echotexture in the left adrenal gland, previous MRI suggestive of benign adrenal adenoma -1 enlarged lymph node in the porta pedis region, endosonographic appearance consistent with benign inflammatory changes.   Colonoscopy  02/24/2022: Nonbleeding internal hemorrhoids.  4 mm polyp in the cecum removed. Pathology with tubular adenoma.  Recommended 5-year surveillance.    Medications   Current Outpatient Medications  Medication Sig Dispense Refill   albuterol (VENTOLIN HFA) 108 (90 Base) MCG/ACT inhaler INHALE 2 PUFFS INTO LUNGS EVERY 6 HOURS AS NEEDED. 18 g 0   atorvastatin (LIPITOR) 40 MG tablet Take 1 tablet (40 mg total) by mouth daily. (Patient taking differently: Take 40 mg by mouth every evening.) 90 tablet 3   Biotin 5000 MCG TABS Take 5,000 mcg by mouth in the morning.     blood glucose meter kit and supplies Dispense based on patient and insurance preference. Use up to four times daily as directed. (FOR ICD-10 E10.9, E11.9). 1 each 0   cyclobenzaprine (FLEXERIL) 10 MG tablet Take 10 mg by mouth every evening.     Insulin Pen Needle (PEN NEEDLES) 32G X 5 MM MISC Use to inject ozempic weekly 50 each 0   linaclotide (LINZESS) 290 MCG CAPS capsule Take 1 capsule (290 mcg total) by mouth daily before breakfast. 30 capsule 5   lisinopril (ZESTRIL) 10 MG tablet Take 1 tablet (10 mg total) by mouth daily. (Patient taking differently: Take 10 mg by mouth every evening.) 90 tablet 3   Multiple Vitamin (MULTIVITAMIN WITH MINERALS) TABS tablet Take 1 tablet by mouth in the morning.     Omega-3 Fatty Acids (FISH OIL) 1000 MG CAPS Take 1,000 mg by mouth in the morning and at bedtime.     pantoprazole (PROTONIX) 40 MG tablet Take 40 mg by mouth every evening.     TRULICITY  4.5 MG/0.5ML SOPN Inject 4.5 mg into the muscle once a week. Every Monday     Vitamin D, Ergocalciferol, (DRISDOL) 1.25 MG (50000 UNIT) CAPS capsule TAKE 1 TABLET WEEKLY. (SAME DAY EACH WEEK) (Patient taking differently: Take 50,000 Units by mouth every Monday. TAKE 1 TABLET WEEKLY. (SAME DAY EACH WEEK)) 12 capsule 0   No current facility-administered medications for this visit.    Allergies   Allergies as of 08/21/2023   (No Known Allergies)       Review of Systems   General: Negative for anorexia, weight loss, fever, chills, fatigue, weakness. ENT: Negative for hoarseness, difficulty swallowing , nasal congestion. CV: Negative for chest pain, angina, palpitations, dyspnea on exertion, peripheral edema.  Respiratory: Negative for dyspnea at rest, dyspnea on exertion, cough, sputum, wheezing.  GI: See history of present illness. GU:  Negative for dysuria, hematuria, urinary incontinence, urinary frequency, nocturnal urination.  Endo: Negative for unusual weight change.     Physical Exam   BP 105/76 (BP Location: Right Arm, Patient Position: Sitting, Cuff Size: Large)   Pulse 75   Temp 98.1 F (36.7 C) (Oral)   Ht 5\' 5"  (1.651 m)   Wt 190 lb 12.8 oz (86.5 kg)   LMP 02/16/2014   SpO2 96%   BMI 31.75 kg/m    General: Well-nourished, well-developed in no acute distress.  Eyes: No icterus. Mouth: Oropharyngeal mucosa moist and pink   Lungs: Clear to auscultation bilaterally.  Heart: Regular rate and rhythm, no murmurs rubs or gallops.  Abdomen: Bowel sounds are normal, nontender, nondistended, no hepatosplenomegaly or masses,  no abdominal bruits or hernia , no rebound or guarding.  Rectal: not performed  Extremities: No lower extremity edema. No clubbing or deformities. Neuro: Alert and oriented x 4   Skin: Warm and dry, no jaundice.   Psych: Alert and cooperative, normal mood and affect.  Labs   See hpi  Imaging Studies   No results found.  Assessment   Constipation -continue Linzess daily. -return ov in one year       [name]. Melvyn Neth, MHS, PA-C [facility]

## 2023-09-06 ENCOUNTER — Encounter: Payer: Self-pay | Admitting: Gastroenterology

## 2024-01-05 IMAGING — MG MM DIGITAL SCREENING BILAT W/ TOMO AND CAD
4 of 9 series · 4 of 29 positions shown · non-contrast
Comparison: Previous exam(s).

ACR Breast Density Category a: The breast tissue is almost entirely
fatty.

CLINICAL DATA: Screening.

EXAM:
DIGITAL SCREENING BILATERAL MAMMOGRAM WITH TOMOSYNTHESIS AND CAD
TECHNIQUE: Bilateral screening digital craniocaudal and mediolateral oblique
mammograms were obtained. Bilateral screening digital breast
tomosynthesis was performed. The images were evaluated with
computer-aided detection.

[R CC synth-2D]
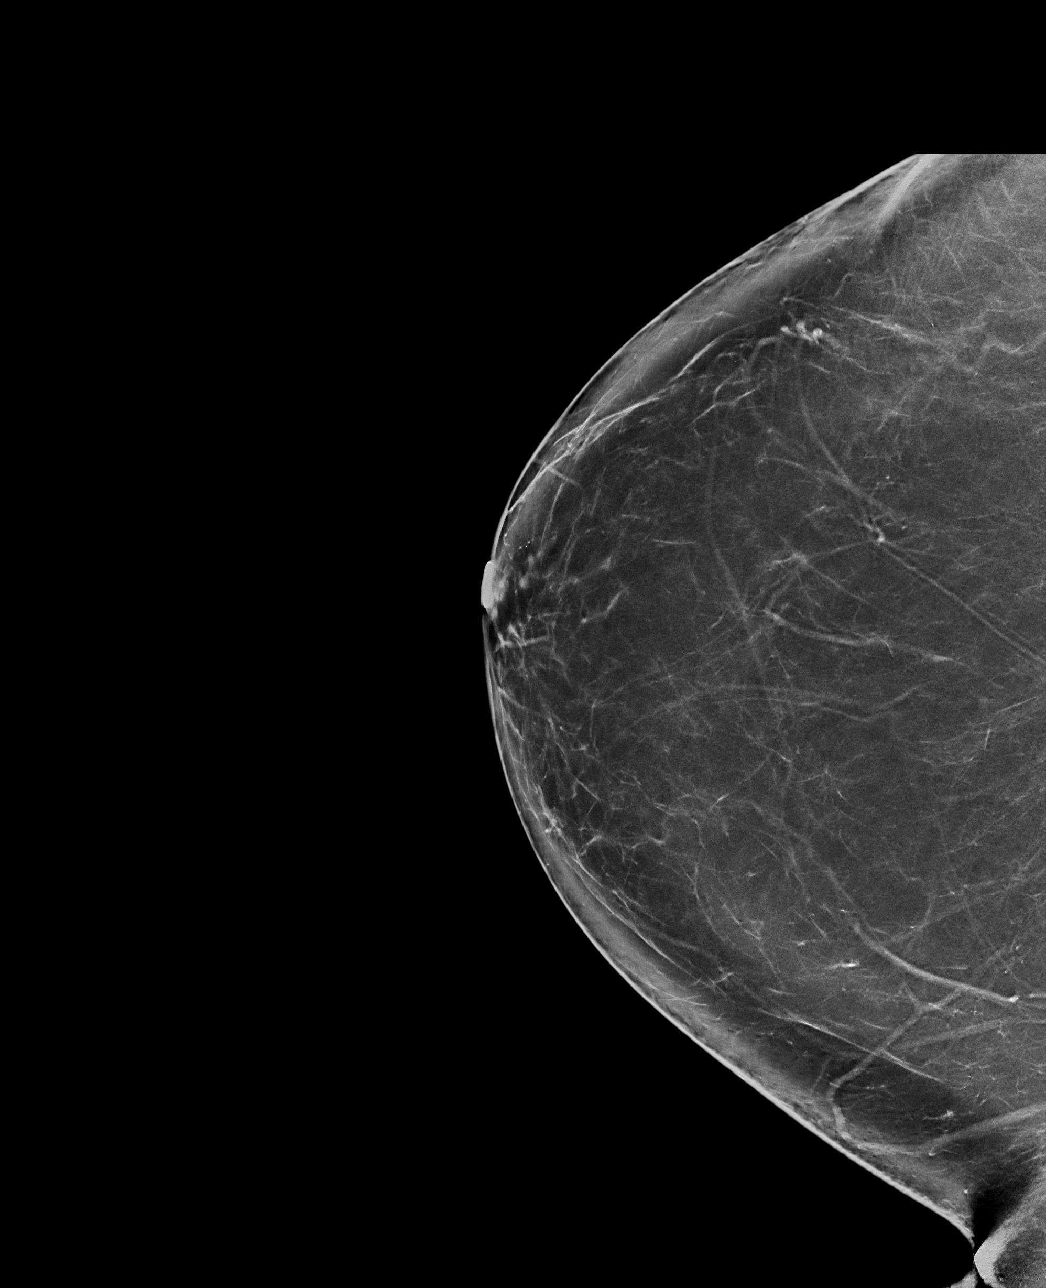

[L MLO synth-2D]
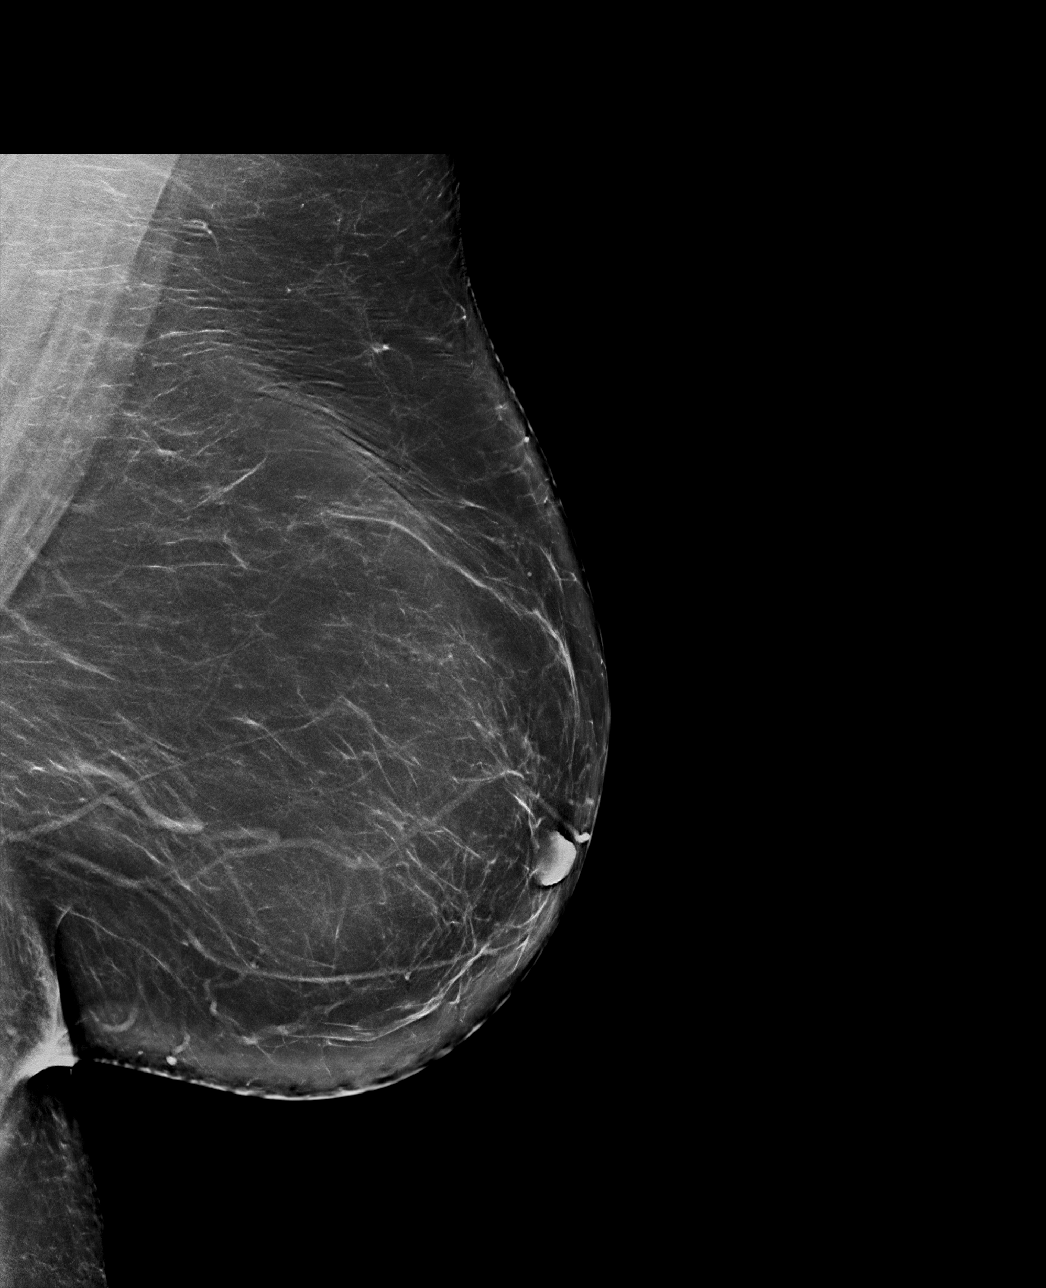

[L CC synth-2D]
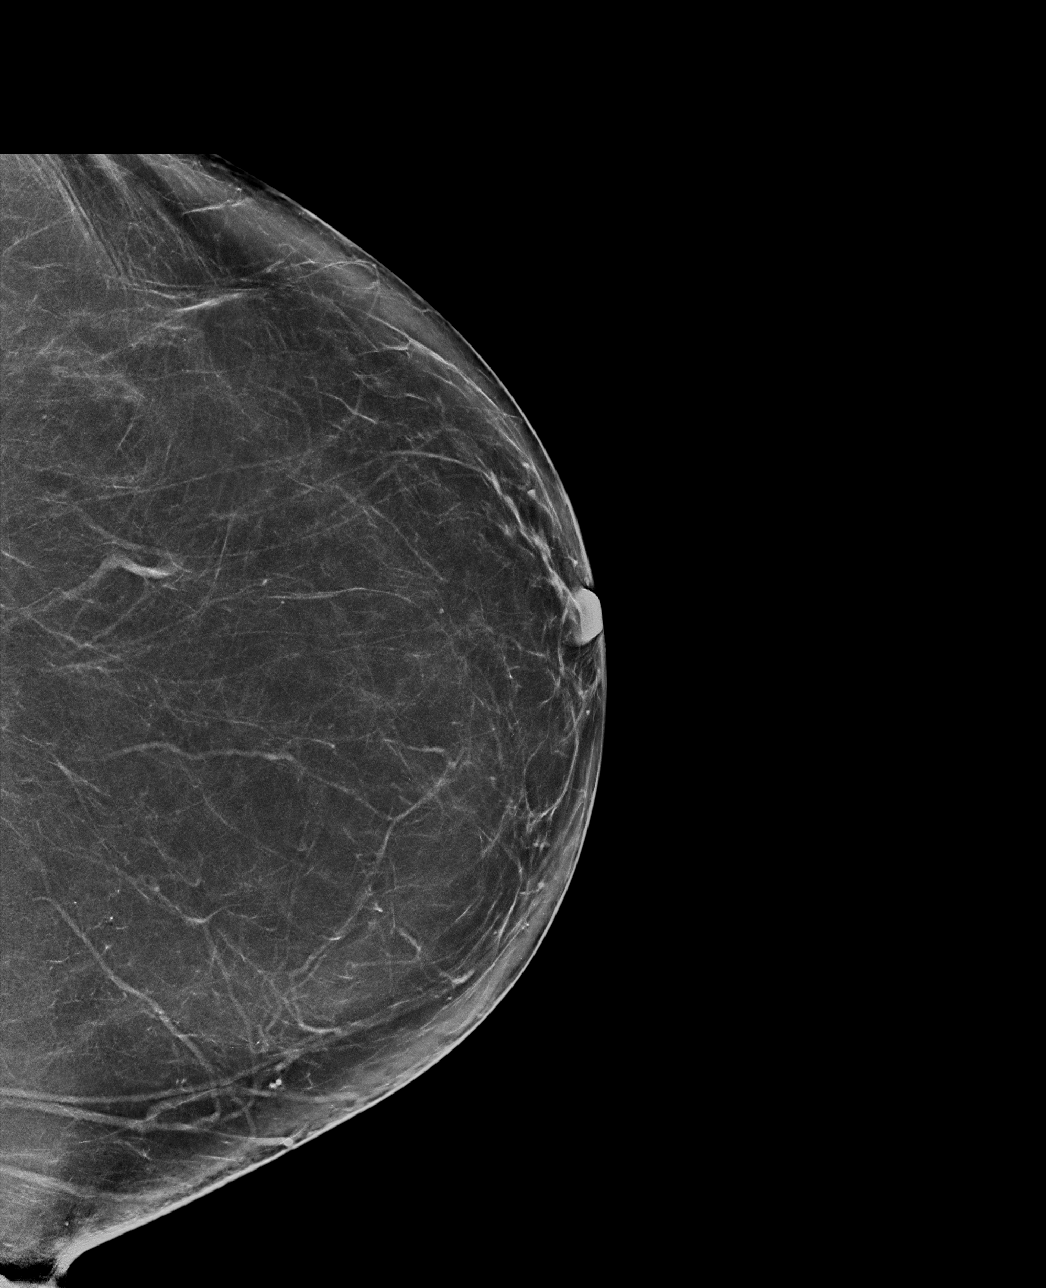

[R MLO synth-2D]
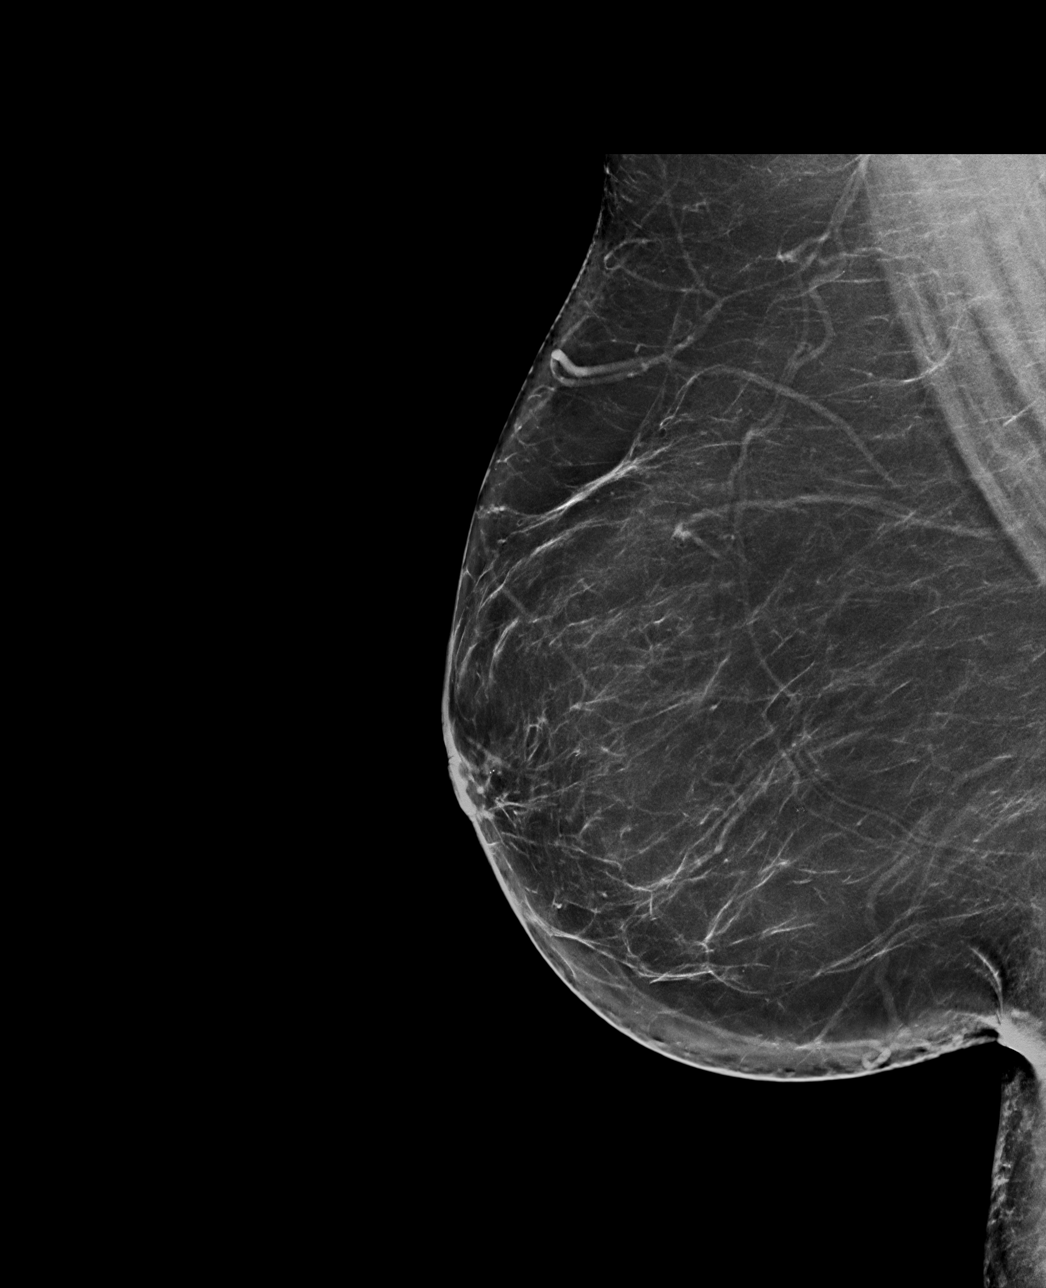

[4 of 29 positions shown; findings below may reference images not displayed]

FINDINGS: There are no findings suspicious for malignancy.
IMPRESSION: No mammographic evidence of malignancy. A result letter of this
screening mammogram will be mailed directly to the patient.

RECOMMENDATION:
Screening mammogram in one year. (Code:0E-3-N98)

BI-RADS CATEGORY  1: Negative.

## 2024-03-10 ENCOUNTER — Other Ambulatory Visit (HOSPITAL_COMMUNITY): Payer: Self-pay | Admitting: Nurse Practitioner

## 2024-03-10 DIAGNOSIS — Z1231 Encounter for screening mammogram for malignant neoplasm of breast: Secondary | ICD-10-CM

## 2024-03-14 ENCOUNTER — Ambulatory Visit (HOSPITAL_COMMUNITY)
Admission: RE | Admit: 2024-03-14 | Discharge: 2024-03-14 | Disposition: A | Discharge status: Home / Self Care | Source: Ambulatory Visit | Attending: Nurse Practitioner | Admitting: Nurse Practitioner

## 2024-03-14 ENCOUNTER — Ambulatory Visit (HOSPITAL_COMMUNITY)
Admission: RE | Admit: 2024-03-14 | Discharge: 2024-03-14 | Disposition: A | Discharge status: Home / Self Care | Source: Ambulatory Visit | Attending: Urology | Admitting: Urology

## 2024-03-14 DIAGNOSIS — Z1231 Encounter for screening mammogram for malignant neoplasm of breast: Secondary | ICD-10-CM | POA: Insufficient documentation

## 2024-03-14 DIAGNOSIS — N200 Calculus of kidney: Secondary | ICD-10-CM | POA: Diagnosis present

## 2024-04-04 ENCOUNTER — Ambulatory Visit: Payer: Medicaid Other | Admitting: Urology

## 2024-05-26 ENCOUNTER — Encounter (HOSPITAL_COMMUNITY): Payer: Self-pay

## 2024-05-26 ENCOUNTER — Emergency Department (HOSPITAL_COMMUNITY)
Admission: EM | Admit: 2024-05-26 | Discharge: 2024-05-26 | Disposition: A | Discharge status: Home / Self Care | Attending: Emergency Medicine | Admitting: Emergency Medicine

## 2024-05-26 ENCOUNTER — Other Ambulatory Visit: Payer: Self-pay

## 2024-05-26 DIAGNOSIS — S90562A Insect bite (nonvenomous), left ankle, initial encounter: Secondary | ICD-10-CM | POA: Diagnosis present

## 2024-05-26 DIAGNOSIS — W57XXXA Bitten or stung by nonvenomous insect and other nonvenomous arthropods, initial encounter: Secondary | ICD-10-CM | POA: Diagnosis not present

## 2024-05-26 MED ORDER — DOXYCYCLINE HYCLATE 100 MG PO CAPS: 100.0000 mg | ORAL_CAPSULE | Freq: Two times a day (BID) | ORAL | 0 refills | Status: AC

## 2024-05-26 NOTE — ED Notes (Signed)
 ED Provider at bedside.

## 2024-05-26 NOTE — Discharge Instructions (Addendum)
 Take all antibiotics as directed.  Follow-up closely with your primary care doctor on an outpatient basis.  Please avoid sun exposure or use sunscreen while taking the antibiotic.

## 2024-05-26 NOTE — ED Provider Notes (Signed)
 [facility] AT [facility] Provider Note   CSN: [id number] Arrival date & time: 05/26/24  1239     Patient presents with: Insect Bite   Michaela Aguilar is a 58 y.o. adult.   Patient is a 58 year old female who presents emergency department the chief complaint of an area of redness to the medial aspect of her left ankle.  She notes that she did pull a tick off of the side approximately 2 weeks ago and the redness has progressed since that point to a circular rash.  She denies any fever, chills, chest pain, shortness of breath, abdominal pain, nausea, vomiting, diarrhea, joint aches, headaches, pain to neck.        Prior to Admission medications   Medication Sig Start Date End Date Taking? Authorizing Provider  albuterol  (VENTOLIN  HFA) 108 (90 Base) MCG/ACT inhaler INHALE 2 PUFFS INTO LUNGS EVERY 6 HOURS AS NEEDED. 01/26/22   Paseda, Folashade R, FNP  atorvastatin  (LIPITOR) 40 MG tablet Take 1 tablet (40 mg total) by mouth daily. Patient taking differently: Take 40 mg by mouth every evening. 11/03/21   Elnor Fairy HERO, NP  Biotin 5000 MCG TABS Take 5,000 mcg by mouth in the morning.    [provider]  blood glucose meter kit and supplies Dispense based on patient and insurance preference. Use up to four times daily as directed. (FOR ICD-10 E10.9, E11.9). 04/13/21   Elnor Fairy HERO, NP  cyclobenzaprine  (FLEXERIL ) 10 MG tablet Take 10 mg by mouth every evening.    [provider]  Insulin  Pen Needle (PEN NEEDLES) 32G X 5 MM MISC Use to inject ozempic  weekly 11/03/21   Elnor Fairy HERO, NP  linaclotide  (LINZESS ) 290 MCG CAPS capsule Take 1 capsule (290 mcg total) by mouth daily before breakfast. 08/21/23   Ezzard Sonny RAMAN, PA-C  lisinopril  (ZESTRIL ) 10 MG tablet Take 1 tablet (10 mg total) by mouth daily. Patient taking differently: Take 10 mg by mouth every evening. 11/03/21   Elnor Fairy HERO, NP  Multiple Vitamin (MULTIVITAMIN WITH MINERALS) TABS  tablet Take 1 tablet by mouth in the morning.    [provider]  Omega-3 Fatty Acids (FISH OIL) 1000 MG CAPS Take 1,000 mg by mouth in the morning and at bedtime.    [provider]  pantoprazole (PROTONIX) 40 MG tablet Take 40 mg by mouth every evening.    [provider]  TRULICITY  4.5 MG/0.5ML SOPN Inject 4.5 mg into the muscle once a week. Every Monday 08/11/23   [provider]  Vitamin D , Ergocalciferol , (DRISDOL ) 1.25 MG (50000 UNIT) CAPS capsule TAKE 1 TABLET WEEKLY. (SAME DAY EACH WEEK) Patient taking differently: Take 50,000 Units by mouth every Monday. TAKE 1 TABLET WEEKLY. (SAME DAY EACH WEEK) 01/26/22   Paseda, Folashade R, FNP    Allergies: Patient has no known allergies.    Review of Systems  Skin:  Positive for rash.  All other systems reviewed and are negative.   Updated Vital Signs BP 117/77 (BP Location: Right Arm)   Pulse 80   Temp 98.3 F (36.8 C) (Oral)   Ht 5' 5 (1.651 m)   Wt 82.6 kg   LMP 02/16/2014   SpO2 100%   BMI 30.29 kg/m   Physical Exam Vitals and nursing note reviewed.  Constitutional:      Appearance: Normal appearance.  HENT:     Head: Normocephalic and atraumatic.  Eyes:     Extraocular Movements: Extraocular movements intact.  Conjunctiva/sclera: Conjunctivae normal.     Pupils: Pupils are equal, round, and reactive to light.  Cardiovascular:     Rate and Rhythm: Normal rate and regular rhythm.     Pulses: Normal pulses.  Pulmonary:     Effort: Pulmonary effort is normal. No respiratory distress.  Abdominal:     General: Abdomen is flat. Bowel sounds are normal.     Palpations: Abdomen is soft.  Musculoskeletal:        General: Normal range of motion.     Cervical back: Normal range of motion and neck supple.  Skin:    General: Skin is warm and dry.     Comments: Circular erythematous rash noted to the medial aspect of left ankle, no lymphatic streaking, no areas of induration or fluctuance,  no petechiae, purpura, bullae  Neurological:     General: No focal deficit present.     Mental Status: She is alert and oriented to person, place, and time. Mental status is at baseline.     (all labs ordered are listed, but only abnormal results are displayed) Labs Reviewed - No data to display  EKG: None  Radiology: No results found.   Procedures   Medications Ordered in the ED - No data to display                                  Medical Decision Making Patient is doing well at this time and is stable for discharge home.  Discussed with patient we will treat her for possible tickborne illness versus cellulitis at this point.  She does have a circular rash to the medial aspect of the left ankle concerning for erythema migrans.  She has no other concerning systemic symptoms at this point.  There is no petechiae, purpura, bullae.  Discussed with patient about the need for close follow-up with her primary care doctor on an outpatient basis.  Strict return precautions were provided for any new or worsening symptoms.  Patient voiced understanding and had no additional questions.        Final diagnoses:  None    ED Discharge Orders     None          Daralene Lonni JONETTA DEVONNA 05/26/24 1257    Melvenia Motto, MD 05/26/24 1451

## 2024-05-26 NOTE — ED Triage Notes (Signed)
 Pt arrived via POV from home c/o possible infection/ rash from a baby tick bite the Pt reports occurred apprx 2 weeks ago. Pt does present with circular rash ion her medial left ankle.

## 2024-06-11 ENCOUNTER — Emergency Department (HOSPITAL_COMMUNITY)
Admission: EM | Admit: 2024-06-11 | Discharge: 2024-06-11 | Disposition: A | Discharge status: Home / Self Care | Attending: Emergency Medicine | Admitting: Emergency Medicine

## 2024-06-11 ENCOUNTER — Encounter (HOSPITAL_COMMUNITY): Payer: Self-pay

## 2024-06-11 ENCOUNTER — Other Ambulatory Visit: Payer: Self-pay

## 2024-06-11 DIAGNOSIS — R112 Nausea with vomiting, unspecified: Secondary | ICD-10-CM | POA: Diagnosis present

## 2024-06-11 DIAGNOSIS — Z794 Long term (current) use of insulin: Secondary | ICD-10-CM | POA: Insufficient documentation

## 2024-06-11 DIAGNOSIS — E1165 Type 2 diabetes mellitus with hyperglycemia: Secondary | ICD-10-CM | POA: Insufficient documentation

## 2024-06-11 DIAGNOSIS — R5383 Other fatigue: Secondary | ICD-10-CM | POA: Insufficient documentation

## 2024-06-11 DIAGNOSIS — R110 Nausea: Secondary | ICD-10-CM

## 2024-06-11 LAB — CBG MONITORING, ED: Glucose-Capillary: 114 mg/dL — ABNORMAL HIGH (ref 70–99)

## 2024-06-11 MED ORDER — ONDANSETRON 4 MG PO TBDP
4.0000 mg | ORAL_TABLET | Freq: Once | ORAL | Status: AC
  Administered 2024-06-11: 4 mg via ORAL
  Filled 2024-06-11: qty 1

## 2024-06-11 MED ORDER — ONDANSETRON HCL 4 MG/2ML IJ SOLN: 4.0000 mg | Freq: Once | INTRAMUSCULAR | Status: DC

## 2024-06-11 MED ORDER — ONDANSETRON 4 MG PO TBDP: 4.0000 mg | ORAL_TABLET | Freq: Four times a day (QID) | ORAL | 0 refills | Status: AC | PRN

## 2024-06-11 NOTE — Discharge Instructions (Addendum)
 Today for nausea and fatigue in relation to her recent tick bite.  You have already been treated with antibiotics for tickborne illness.  Please follow-up closely with your PCP since you did not want labs done here today if you are still having nausea and fatigue they will need to do further testing.  If you have new or worsening symptoms such as persistent vomiting, chest pain or shortness of breath, weakness or other worrisome changes come back to the ER right away.

## 2024-06-11 NOTE — ED Notes (Signed)
 ..  The patient is A&OX4, ambulatory at d/c with independent steady gait, NAD. Pt verbalized understanding of d/c instructions, prescription and follow up care.

## 2024-06-11 NOTE — ED Triage Notes (Signed)
 Pt arrived via POV for follow-up evaluation of a tick bite to her left ankle. Pt reports she finished taking her Doxycycline , but over the past week has been experiencing increased fatigue and has had an episode of emesis.

## 2024-06-11 NOTE — ED Provider Notes (Signed)
 Michaela Aguilar EMERGENCY DEPARTMENT AT [facility] Provider Note   CSN: [id number] Arrival date & time: 06/11/24  1656     Patient presents with: Insect Bite   Michaela Aguilar is a 58 y.o. adult.  She has past history of diabetes, obesity, high cholesterol.  Presents to ER today for fatigue and nausea.  She states 14 days ago she was seen here for a tick bite on her left ankle and prescribed doxycycline .  Took the full course and states that the rash went away completely but she developed some fatigue and nausea 2 days ago, had 1 episode of vomiting yellow clear fluid and has had no further vomiting, no abdominal pain, no fevers or chills, no chest pain, no shortness of breath.  She says she still has nausea and wanted if she was okay.  He is worried that this could still be related to the bite.   HPI     Prior to Admission medications   Medication Sig Start Date End Date Taking? Authorizing Provider  albuterol  (VENTOLIN  HFA) 108 (90 Base) MCG/ACT inhaler INHALE 2 PUFFS INTO LUNGS EVERY 6 HOURS AS NEEDED. 01/26/22   Paseda, Folashade R, FNP  atorvastatin  (LIPITOR) 40 MG tablet Take 1 tablet (40 mg total) by mouth daily. Patient taking differently: Take 40 mg by mouth every evening. 11/03/21   Elnor Fairy HERO, NP  Biotin 5000 MCG TABS Take 5,000 mcg by mouth in the morning.    [provider]  blood glucose meter kit and supplies Dispense based on patient and insurance preference. Use up to four times daily as directed. (FOR ICD-10 E10.9, E11.9). 04/13/21   Elnor Fairy HERO, NP  cyclobenzaprine  (FLEXERIL ) 10 MG tablet Take 10 mg by mouth every evening.    [provider]  Insulin  Pen Needle (PEN NEEDLES) 32G X 5 MM MISC Use to inject ozempic  weekly 11/03/21   Elnor Fairy HERO, NP  linaclotide  (LINZESS ) 290 MCG CAPS capsule Take 1 capsule (290 mcg total) by mouth daily before breakfast. 08/21/23   Ezzard Sonny RAMAN, PA-C  lisinopril  (ZESTRIL ) 10 MG tablet Take 1 tablet (10 mg  total) by mouth daily. Patient taking differently: Take 10 mg by mouth every evening. 11/03/21   Elnor Fairy HERO, NP  Multiple Vitamin (MULTIVITAMIN WITH MINERALS) TABS tablet Take 1 tablet by mouth in the morning.    [provider]  Omega-3 Fatty Acids (FISH OIL) 1000 MG CAPS Take 1,000 mg by mouth in the morning and at bedtime.    [provider]  pantoprazole (PROTONIX) 40 MG tablet Take 40 mg by mouth every evening.    [provider]  TRULICITY  4.5 MG/0.5ML SOPN Inject 4.5 mg into the muscle once a week. Every Monday 08/11/23   [provider]  Vitamin D , Ergocalciferol , (DRISDOL ) 1.25 MG (50000 UNIT) CAPS capsule TAKE 1 TABLET WEEKLY. (SAME DAY EACH WEEK) Patient taking differently: Take 50,000 Units by mouth every Monday. TAKE 1 TABLET WEEKLY. (SAME DAY EACH WEEK) 01/26/22   Paseda, Folashade R, FNP    Allergies: Patient has no known allergies.    Review of Systems  Updated Vital Signs BP 122/77 (BP Location: Right Arm)   Pulse 76   Temp 98.7 F (37.1 C) (Oral)   Resp 18   Ht 5' 5 (1.651 m)   Wt 82.6 kg   LMP 02/16/2014   SpO2 100%   BMI 30.29 kg/m   Physical Exam Vitals and nursing note reviewed.  Constitutional:  General: She is not in acute distress.    Appearance: She is well-developed.  HENT:     Head: Normocephalic and atraumatic.     Mouth/Throat:     Mouth: Mucous membranes are moist.  Eyes:     Conjunctiva/sclera: Conjunctivae normal.  Cardiovascular:     Rate and Rhythm: Normal rate and regular rhythm.     Heart sounds: No murmur heard. Pulmonary:     Effort: Pulmonary effort is normal. No respiratory distress.     Breath sounds: Normal breath sounds.  Abdominal:     General: There is no distension.     Palpations: Abdomen is soft.     Tenderness: There is no abdominal tenderness.  Musculoskeletal:        General: No swelling.     Cervical back: Neck supple.  Skin:    General: Skin is warm and dry.      Capillary Refill: Capillary refill takes less than 2 seconds.     Comments: Site of previous tick bite with a circular red rash to left medial ankle is now well-healed with no sign of redness, swelling or warmth.  Neurological:     General: No focal deficit present.     Mental Status: She is alert and oriented to person, place, and time.  Psychiatric:        Mood and Affect: Mood normal.     (all labs ordered are listed, but only abnormal results are displayed) Labs Reviewed  CBG MONITORING, ED    EKG: None  Radiology: No results found.   Procedures   Medications Ordered in the ED  ondansetron  (ZOFRAN ) injection 4 mg (has no administration in time range)                                    Medical Decision Making Differential diagnosis includes not limited to gastritis, gastroenteritis, cholecystitis, pancreatitis, Lyme disease, hyperglycemia, other  ED course: Patient presents to the ER for evaluation of nausea and fatigue for the past couple of days with only 1 episode of emesis, she is able to eat and drink well otherwise since her vomiting on Monday.  She is worried is related to the tick bite.  She completed her course of doxycycline  and the rash has resolved.  Discussed I do not suspect this is related to the tickborne illness, she should follow with PCP for further concerns but given that she was treated with likely an embolus admission for tickborne illness.  I did offer labs to further evaluate her nausea and fatigue including CBC and CMP which were checked liver function, electrolytes and blood counts.  Patient declined, she states she feels okay just wanted us  to link that her ankle and address her nausea.  She is willing to let me get an Accu-Chek to check her blood sugar, she does not check her sugar at home but states she can tell when its high and she thinks it is normal at this time.  She denies urinary symptoms, denies flank pain.  Given her reassuring exam and  vitals I feel not pursuing lab workup is reasonable and we will treat her with Zofran  and have her follow-up closely with her PCP.  She was given strict return precautions.  Risk Prescription drug management.        Final diagnoses:  None    ED Discharge Orders     None  Michaela Aguilar 06/11/24 2039    Suzette Pac, MD 06/13/24 1426

## 2024-07-04 ENCOUNTER — Ambulatory Visit: Admitting: Urology

## 2024-07-04 DIAGNOSIS — N200 Calculus of kidney: Secondary | ICD-10-CM

## 2024-07-29 ENCOUNTER — Encounter: Payer: Self-pay | Admitting: Gastroenterology
